# Patient Record
Sex: Male | Born: 1935
Health system: Southern US, Academic
[De-identification: ages and names within clinical notes are randomized; demographics above are authoritative.]

## PROBLEM LIST (undated history)

## (undated) ENCOUNTER — Encounter
Attending: Student in an Organized Health Care Education/Training Program | Primary: Student in an Organized Health Care Education/Training Program

## (undated) ENCOUNTER — Telehealth
Attending: Student in an Organized Health Care Education/Training Program | Primary: Student in an Organized Health Care Education/Training Program

## (undated) ENCOUNTER — Encounter

## (undated) ENCOUNTER — Ambulatory Visit: Payer: Medicare (Managed Care)

## (undated) ENCOUNTER — Ambulatory Visit

## (undated) ENCOUNTER — Telehealth

## (undated) ENCOUNTER — Ambulatory Visit: Payer: MEDICARE

## (undated) ENCOUNTER — Telehealth: Attending: Ambulatory Care | Primary: Ambulatory Care

## (undated) ENCOUNTER — Inpatient Hospital Stay

## (undated) ENCOUNTER — Ambulatory Visit: Payer: Medicare (Managed Care) | Attending: Adult Health | Primary: Adult Health

## (undated) ENCOUNTER — Encounter: Attending: Family | Primary: Family

## (undated) ENCOUNTER — Ambulatory Visit
Payer: Medicare (Managed Care) | Attending: Clinical Cardiac Electrophysiology | Primary: Clinical Cardiac Electrophysiology

## (undated) ENCOUNTER — Encounter: Attending: Internal Medicine | Primary: Internal Medicine

## (undated) ENCOUNTER — Ambulatory Visit: Payer: Medicare (Managed Care) | Attending: Rheumatology | Primary: Rheumatology

## (undated) ENCOUNTER — Ambulatory Visit: Payer: Medicare (Managed Care) | Attending: Internal Medicine | Primary: Internal Medicine

## (undated) ENCOUNTER — Encounter: Attending: Nurse Practitioner | Primary: Nurse Practitioner

## (undated) ENCOUNTER — Encounter: Attending: Ambulatory Care | Primary: Ambulatory Care

## (undated) ENCOUNTER — Other Ambulatory Visit

## (undated) ENCOUNTER — Ambulatory Visit
Attending: Student in an Organized Health Care Education/Training Program | Primary: Student in an Organized Health Care Education/Training Program

## (undated) ENCOUNTER — Ambulatory Visit: Payer: MEDICARE | Attending: Family | Primary: Family

## (undated) ENCOUNTER — Ambulatory Visit
Payer: Medicare (Managed Care) | Attending: Student in an Organized Health Care Education/Training Program | Primary: Student in an Organized Health Care Education/Training Program

## (undated) ENCOUNTER — Ambulatory Visit: Payer: Medicare (Managed Care) | Attending: Family | Primary: Family

## (undated) ENCOUNTER — Telehealth: Attending: Nurse Practitioner | Primary: Nurse Practitioner

## (undated) ENCOUNTER — Inpatient Hospital Stay: Payer: Medicare (Managed Care)

## (undated) ENCOUNTER — Encounter: Attending: Pharmacist | Primary: Pharmacist

---
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
## 2011-03-28 IMAGING — CR DG KNEE COMPLETE 4+V*R*
1 series · 4 of 4 positions shown · non-contrast
Comparison: none

REASON FOR EXAM: pain, swelling after fall
COMMENTS:

PROCEDURE:     MDR - MDR KNEE RT COMPLETE W/OBLIQUES  - October 23, 2009 [DATE]
RESULT:     No fracture or dislocation is observed. There is marked
narrowing of the knee joint space laterally consistent with arthritic
change. The patella is intact.

[Series 1: view not recorded · 0.17mm/px · 4 of 4 slices shown]
[im 1/4]
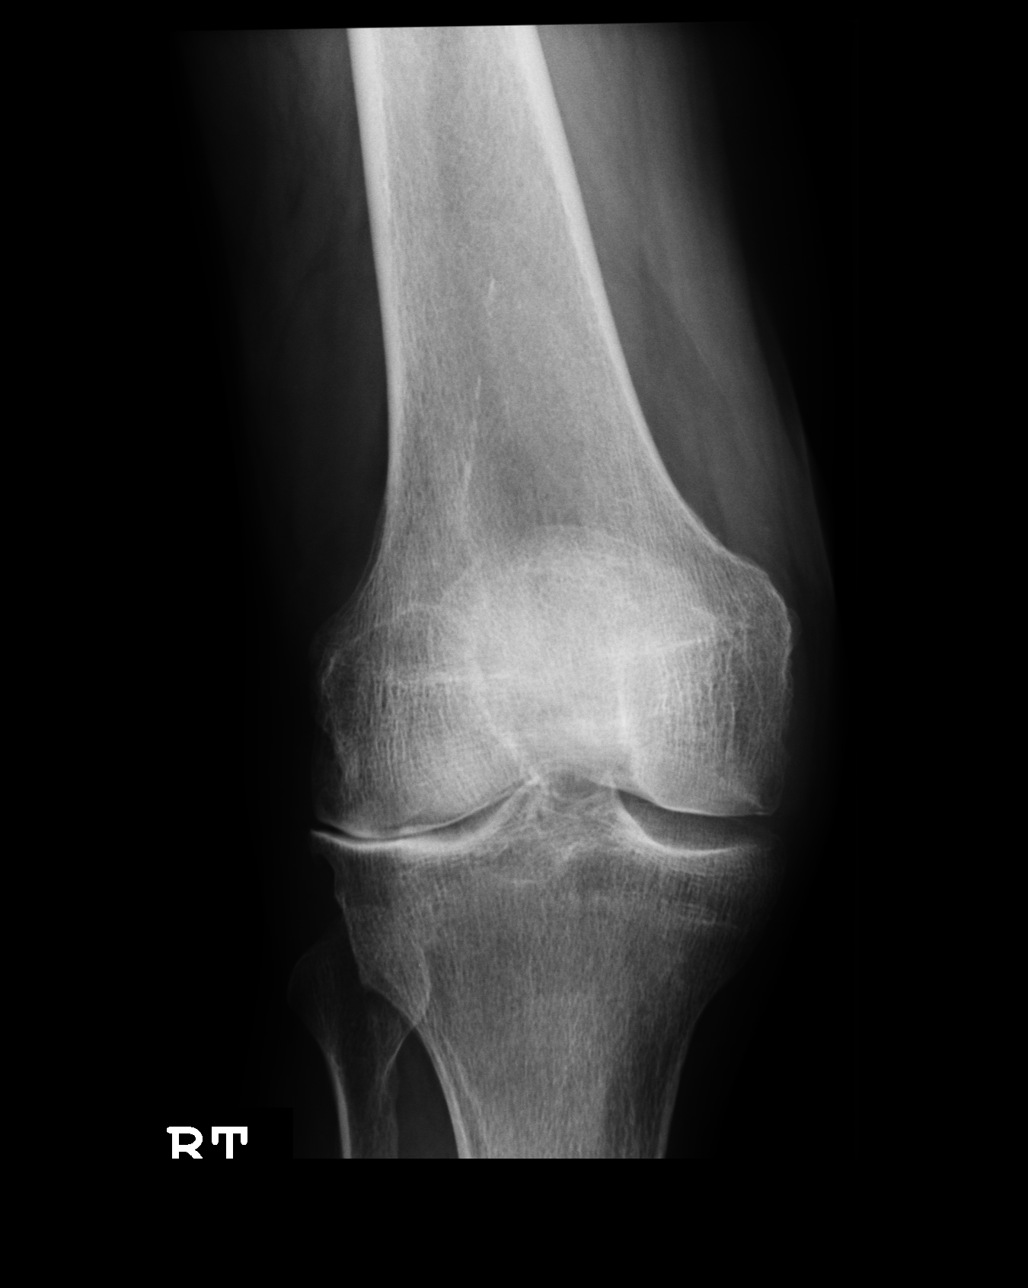
[im 2/4]
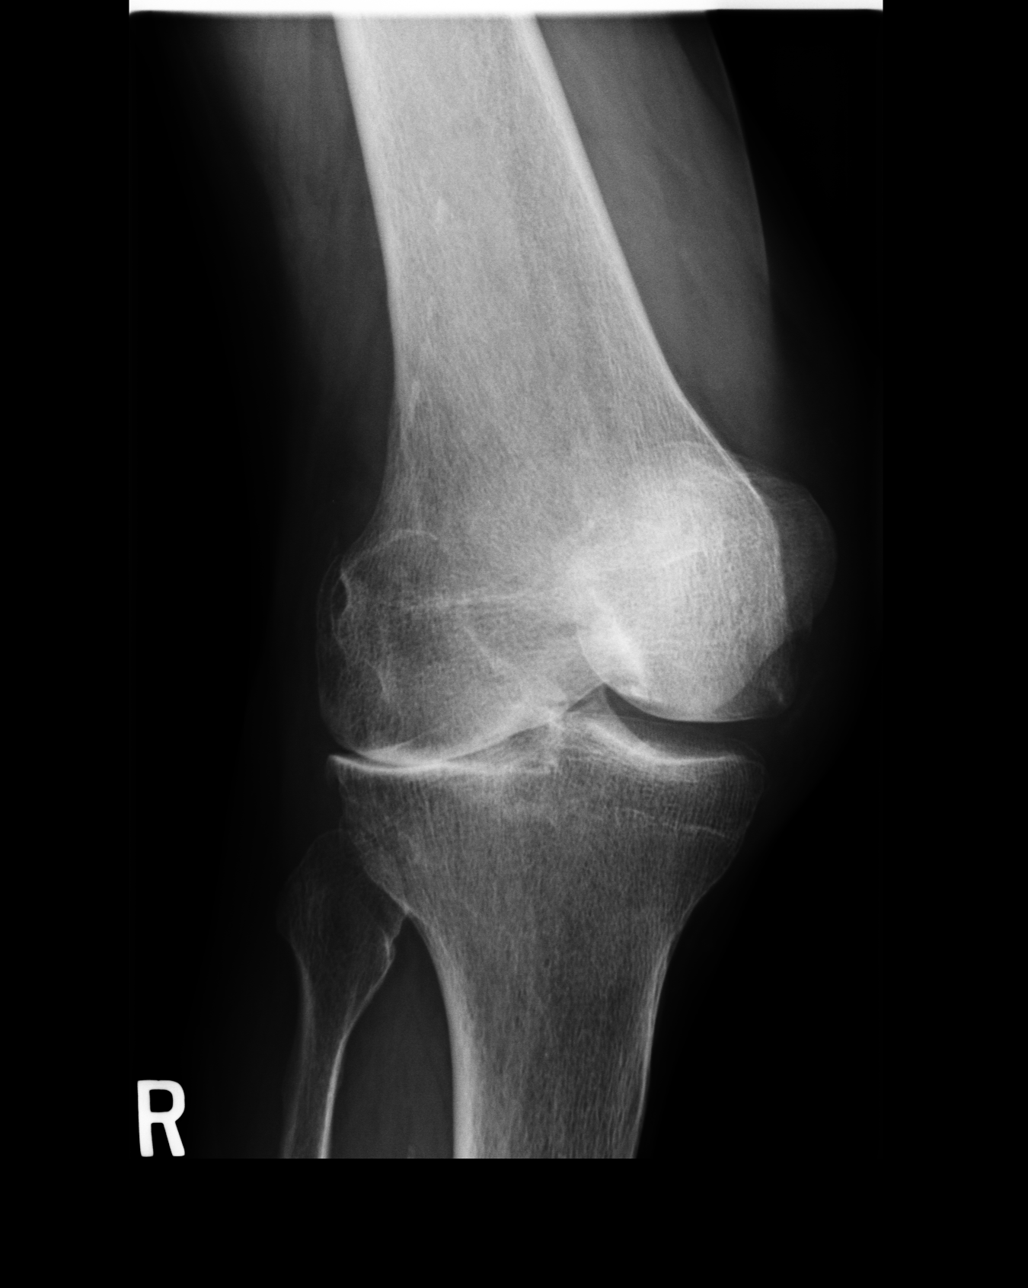
[im 3/4]
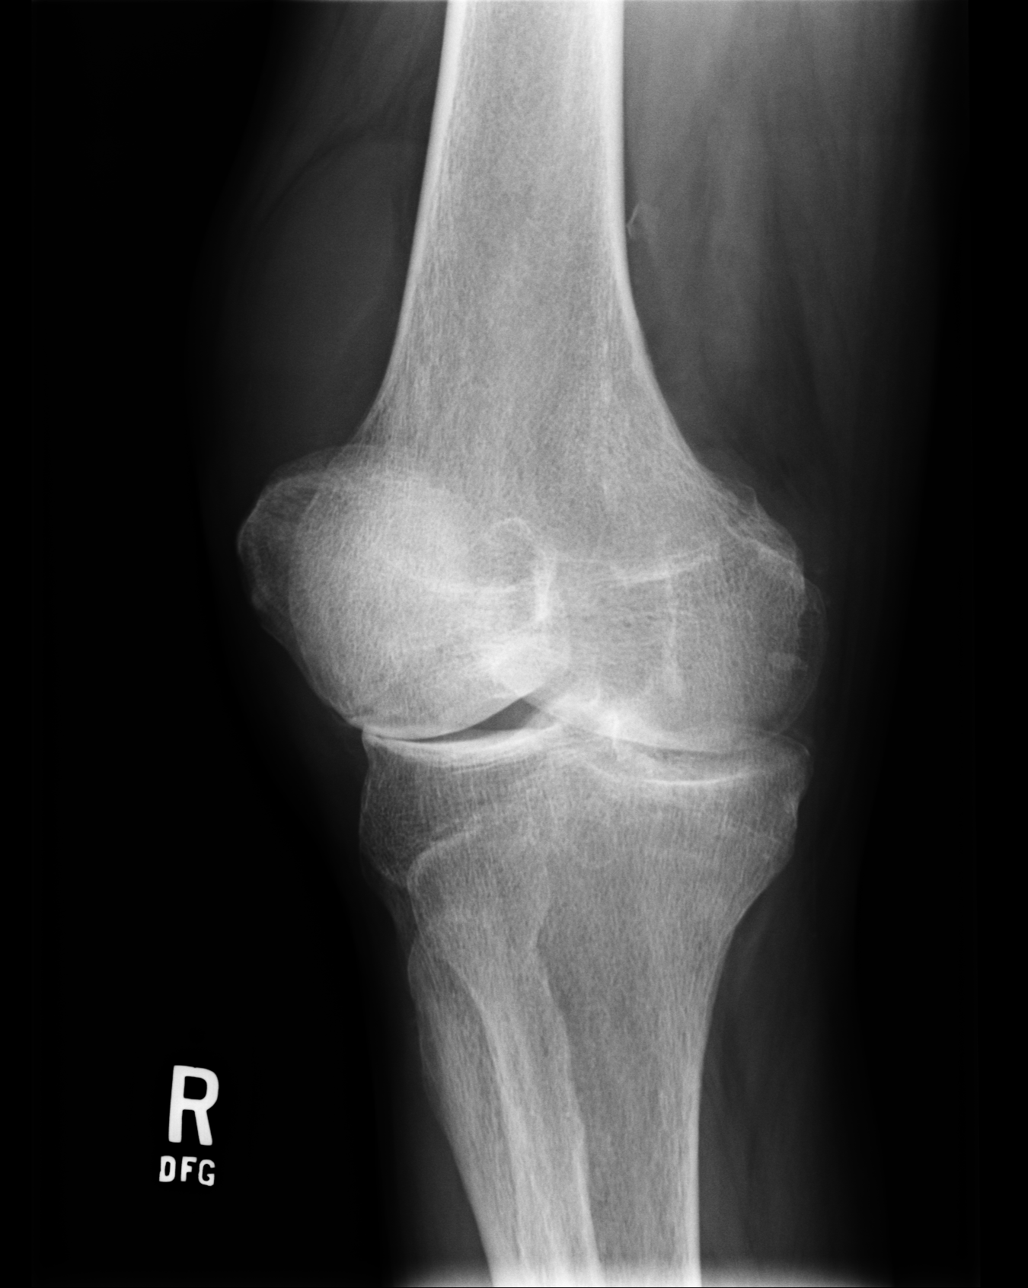
[im 4/4]
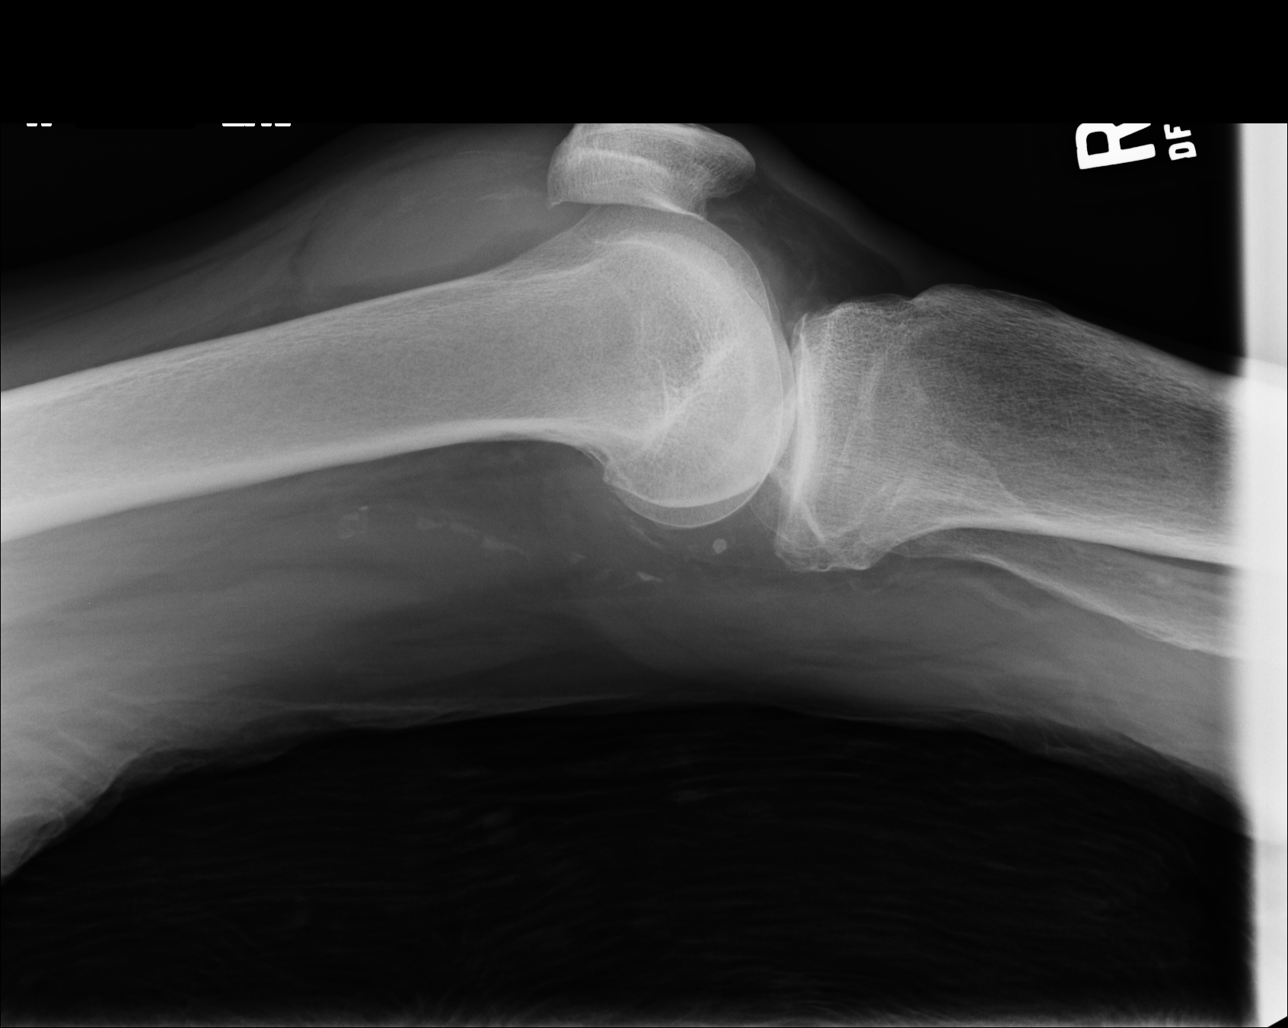

[4 of 4 positions shown; findings below may reference images not displayed]

IMPRESSION: 1. There is prominent arthritic change involving the knee joint laterally
where the knee joint space is nearly obliterated.
2. No fracture is identified.

## 2017-07-11 ENCOUNTER — Ambulatory Visit
Admission: RE | Admit: 2017-07-11 | Discharge: 2017-07-11 | Disposition: A | Discharge status: Home / Self Care | Payer: MEDICARE | Attending: Family Medicine

## 2017-07-11 DIAGNOSIS — D7589 Other specified diseases of blood and blood-forming organs: Secondary | ICD-10-CM

## 2017-07-11 DIAGNOSIS — I10 Essential (primary) hypertension: Principal | ICD-10-CM

## 2017-07-11 DIAGNOSIS — J449 Chronic obstructive pulmonary disease, unspecified: Secondary | ICD-10-CM

## 2017-07-11 DIAGNOSIS — G8929 Other chronic pain: Secondary | ICD-10-CM

## 2017-07-11 DIAGNOSIS — I25810 Atherosclerosis of coronary artery bypass graft(s) without angina pectoris: Secondary | ICD-10-CM

## 2017-07-11 DIAGNOSIS — M545 Low back pain: Secondary | ICD-10-CM

## 2017-07-11 MED ORDER — OXYCODONE-ACETAMINOPHEN 5 MG-325 MG TABLET
ORAL_TABLET | Freq: Four times a day (QID) | ORAL | 0 refills | 0 days | Status: CP | PRN
Start: 2017-07-11 — End: 2017-07-11

## 2017-07-11 MED ORDER — HYDRALAZINE 25 MG TABLET
ORAL_TABLET | Freq: Every day | ORAL | 11 refills | 0 days | Status: CP
Start: 2017-07-11 — End: 2018-04-03

## 2017-07-11 MED ORDER — LISINOPRIL 40 MG TABLET
ORAL_TABLET | Freq: Two times a day (BID) | ORAL | 11 refills | 0 days | Status: CP
Start: 2017-07-11 — End: 2017-12-15

## 2017-07-11 MED ORDER — LOSARTAN 100 MG TABLET
ORAL_TABLET | Freq: Every day | ORAL | 11 refills | 0.00000 days | Status: CP
Start: 2017-07-11 — End: 2018-04-03

## 2017-07-11 MED ORDER — ATENOLOL 50 MG TABLET
ORAL_TABLET | Freq: Two times a day (BID) | ORAL | 11 refills | 0 days | Status: CP
Start: 2017-07-11 — End: 2018-04-03

## 2017-08-11 MED ORDER — OXYCODONE-ACETAMINOPHEN 5 MG-325 MG TABLET
ORAL_TABLET | Freq: Four times a day (QID) | ORAL | 0 refills | 0 days | Status: CP | PRN
Start: 2017-08-11 — End: 2017-07-11

## 2017-09-11 MED ORDER — OXYCODONE-ACETAMINOPHEN 5 MG-325 MG TABLET
ORAL_TABLET | Freq: Four times a day (QID) | ORAL | 0 refills | 0.00000 days | Status: CP | PRN
Start: 2017-09-11 — End: 2017-10-02

## 2017-09-25 MED ORDER — AMLODIPINE 10 MG TABLET
ORAL_TABLET | 2 refills | 0 days | Status: CP
Start: 2017-09-25 — End: 2017-12-29

## 2017-10-02 ENCOUNTER — Ambulatory Visit: Admission: RE | Admit: 2017-10-02 | Discharge: 2017-10-02 | Payer: MEDICARE | Admitting: Family Medicine

## 2017-10-02 DIAGNOSIS — M545 Low back pain: Secondary | ICD-10-CM

## 2017-10-02 DIAGNOSIS — G8929 Other chronic pain: Secondary | ICD-10-CM

## 2017-10-02 DIAGNOSIS — H5462 Unqualified visual loss, left eye, normal vision right eye: Principal | ICD-10-CM

## 2017-10-09 MED ORDER — OXYCODONE-ACETAMINOPHEN 5 MG-325 MG TABLET
ORAL_TABLET | Freq: Four times a day (QID) | ORAL | 0 refills | 0.00000 days | Status: CP | PRN
Start: 2017-10-09 — End: 2017-10-02

## 2017-10-11 ENCOUNTER — Ambulatory Visit
Admission: RE | Admit: 2017-10-11 | Discharge: 2017-10-11 | Disposition: A | Discharge status: Home / Self Care | Payer: MEDICARE

## 2017-10-11 DIAGNOSIS — H47012 Ischemic optic neuropathy, left eye: Principal | ICD-10-CM

## 2017-10-13 MED ORDER — SIMVASTATIN 10 MG TABLET
ORAL_TABLET | Freq: Every day | ORAL | 1 refills | 0 days | Status: CP
Start: 2017-10-13 — End: 2018-04-30

## 2017-10-20 ENCOUNTER — Ambulatory Visit: Admission: RE | Admit: 2017-10-20 | Discharge: 2017-10-20 | Payer: MEDICARE

## 2017-10-20 DIAGNOSIS — M316 Other giant cell arteritis: Secondary | ICD-10-CM

## 2017-10-20 DIAGNOSIS — H47012 Ischemic optic neuropathy, left eye: Principal | ICD-10-CM

## 2017-10-22 DIAGNOSIS — H547 Unspecified visual loss: Principal | ICD-10-CM

## 2017-10-23 ENCOUNTER — Ambulatory Visit: Admission: RE | Admit: 2017-10-23 | Discharge: 2017-10-23 | Disposition: A | Discharge status: Home / Self Care

## 2017-10-23 ENCOUNTER — Ambulatory Visit
Admission: RE | Admit: 2017-10-23 | Discharge: 2017-10-23 | Disposition: A | Discharge status: Home / Self Care | Attending: Otolaryngology | Admitting: Otolaryngology

## 2017-10-23 DIAGNOSIS — H547 Unspecified visual loss: Principal | ICD-10-CM

## 2017-10-23 MED ORDER — BACITRACIN ZINC 500 UNIT/GRAM TOPICAL OINTMENT
0 refills | 0.00000 days
Start: 2017-10-23 — End: 2017-12-15

## 2017-10-23 MED ORDER — ACETAMINOPHEN 325 MG TABLET
Freq: Four times a day (QID) | ORAL | 0 refills | 0 days | PRN
Start: 2017-10-23 — End: 2017-12-15

## 2017-10-23 MED ORDER — IBUPROFEN 200 MG TABLET
ORAL_TABLET | Freq: Three times a day (TID) | ORAL | 0 refills | 0.00000 days | PRN
Start: 2017-10-23 — End: 2017-12-19

## 2017-10-26 MED ORDER — OMEPRAZOLE 20 MG CAPSULE,DELAYED RELEASE
ORAL_CAPSULE | Freq: Every day | ORAL | 3 refills | 0 days | Status: CP
Start: 2017-10-26 — End: 2017-12-15

## 2017-10-26 MED ORDER — PREDNISONE 20 MG TABLET
ORAL_TABLET | Freq: Every day | ORAL | 5 refills | 0.00000 days | Status: CP
Start: 2017-10-26 — End: 2017-11-25

## 2017-10-31 ENCOUNTER — Ambulatory Visit: Admission: RE | Admit: 2017-10-31 | Discharge: 2017-10-31 | Admitting: Otolaryngology

## 2017-10-31 DIAGNOSIS — Z09 Encounter for follow-up examination after completed treatment for conditions other than malignant neoplasm: Principal | ICD-10-CM

## 2017-11-05 MED ORDER — OXYCODONE-ACETAMINOPHEN 5 MG-325 MG TABLET: 1 | tablet | Freq: Four times a day (QID) | 0 refills | 0 days | Status: AC

## 2017-11-09 MED ORDER — OXYCODONE-ACETAMINOPHEN 5 MG-325 MG TABLET
ORAL_TABLET | Freq: Four times a day (QID) | ORAL | 0 refills | 0.00000 days | Status: CP | PRN
Start: 2017-11-09 — End: 2017-10-02

## 2017-11-17 ENCOUNTER — Ambulatory Visit: Admission: RE | Admit: 2017-11-17 | Discharge: 2017-11-17 | Payer: MEDICARE

## 2017-11-17 DIAGNOSIS — M316 Other giant cell arteritis: Principal | ICD-10-CM

## 2017-11-17 DIAGNOSIS — H47012 Ischemic optic neuropathy, left eye: Secondary | ICD-10-CM

## 2017-11-28 ENCOUNTER — Ambulatory Visit
Admission: RE | Admit: 2017-11-28 | Discharge: 2017-11-28 | Disposition: A | Discharge status: Home / Self Care | Payer: MEDICARE

## 2017-11-28 DIAGNOSIS — M549 Dorsalgia, unspecified: Secondary | ICD-10-CM

## 2017-11-28 DIAGNOSIS — M316 Other giant cell arteritis: Principal | ICD-10-CM

## 2017-11-28 DIAGNOSIS — Z7952 Long term (current) use of systemic steroids: Secondary | ICD-10-CM

## 2017-11-28 MED ORDER — TOCILIZUMAB 162 MG/0.9 ML SUBCUTANEOUS SYRINGE
SUBCUTANEOUS | 6 refills | 0.00000 days | Status: SS
Start: 2017-11-28 — End: 2017-11-28

## 2017-11-28 MED ORDER — TOCILIZUMAB 162 MG/0.9 ML SUBCUTANEOUS SYRINGE: mL | 6 refills | 0 days

## 2017-12-01 LAB — HIV ANTIGEN/ANTIBODY COMBO: HIV 1+2 Ab+HIV1 p24 Ag:PrThr:Pt:Ser/Plas:Ord:IA: NONREACTIVE

## 2017-12-01 LAB — HEPATITIS C ANTIBODY: Hepatitis C virus Ab:PrThr:Pt:Ser:Ord:: NONREACTIVE

## 2017-12-01 LAB — HEPATITIS B SURFACE ANTIBODY QUANT: Hepatitis B virus surface Ab:ACnc:Pt:Ser:Qn:: <8

## 2017-12-01 LAB — HEPATITIS B SURFACE ANTIGEN: Hepatitis B virus surface Ag:PrThr:Pt:Ser:Ord:: NONREACTIVE

## 2017-12-01 NOTE — Unmapped (Signed)
Addended by: Tommie Raymond on: 12/01/2017 11:43 AM     Modules accepted: Orders

## 2017-12-02 LAB — QUANTIFERON TB GOLD PLUS
QUANTIFERON ANTIGEN 1 MINUS NIL: -0.08 [IU]/mL
QUANTIFERON ANTIGEN 2 MINUS NIL: -0.1 [IU]/mL
QUANTIFERON MITOGEN: -0.08 [IU]/mL

## 2017-12-02 LAB — QUANTIFERON MITOGEN: Lab: -0.08

## 2017-12-04 LAB — VITAMIN D, TOTAL (25OH): Lab: 27.2

## 2017-12-09 MED ORDER — OXYCODONE-ACETAMINOPHEN 5 MG-325 MG TABLET
ORAL_TABLET | Freq: Four times a day (QID) | ORAL | 0 refills | 0.00000 days | Status: CP | PRN
Start: 2017-12-09 — End: 2017-11-05

## 2017-12-12 DIAGNOSIS — M316 Other giant cell arteritis: Principal | ICD-10-CM

## 2017-12-15 ENCOUNTER — Inpatient Hospital Stay
Admission: EM | Admit: 2017-12-15 | Discharge: 2017-12-19 | Disposition: A | Discharge status: Home / Self Care | Payer: MEDICARE | Source: Intra-hospital

## 2017-12-15 ENCOUNTER — Inpatient Hospital Stay
Admission: EM | Admit: 2017-12-15 | Discharge: 2017-12-19 | Disposition: A | Discharge status: Home / Self Care | Payer: MEDICARE | Source: Intra-hospital | Attending: Anesthesiology

## 2017-12-15 ENCOUNTER — Inpatient Hospital Stay
Admission: EM | Admit: 2017-12-15 | Discharge: 2017-12-19 | Disposition: A | Discharge status: Home / Self Care | Payer: MEDICARE | Source: Intra-hospital | Attending: Internal Medicine | Admitting: Internal Medicine

## 2017-12-15 DIAGNOSIS — K922 Gastrointestinal hemorrhage, unspecified: Principal | ICD-10-CM

## 2017-12-15 LAB — COMPREHENSIVE METABOLIC PANEL
ALBUMIN: 3.5 g/dL (ref 3.5–5.0)
ALKALINE PHOSPHATASE: 81 U/L (ref 38–126)
ALT (SGPT): 31 U/L (ref 19–72)
ANION GAP: 6 mmol/L — ABNORMAL LOW (ref 9–15)
AST (SGOT): 26 U/L (ref 19–55)
BILIRUBIN TOTAL: 0.3 mg/dL (ref 0.0–1.2)
BLOOD UREA NITROGEN: 27 mg/dL — ABNORMAL HIGH (ref 7–21)
BUN / CREAT RATIO: 28
CALCIUM: 9.3 mg/dL (ref 8.5–10.2)
CHLORIDE: 99 mmol/L (ref 98–107)
CO2: 28 mmol/L (ref 22.0–30.0)
EGFR MDRD AF AMER: >=60 mL/min/{1.73_m2} (ref >=60)
EGFR MDRD NON AF AMER: >=60 mL/min/{1.73_m2} (ref >=60)
GLUCOSE RANDOM: 124 mg/dL (ref 65–179)
POTASSIUM: 4.6 mmol/L (ref 3.5–5.0)
PROTEIN TOTAL: 6 g/dL — ABNORMAL LOW (ref 6.5–8.3)
SODIUM: 133 mmol/L — ABNORMAL LOW (ref 135–145)

## 2017-12-15 LAB — CBC W/ AUTO DIFF
BASOPHILS ABSOLUTE COUNT: 0 10*9/L (ref 0.0–0.1)
EOSINOPHILS ABSOLUTE COUNT: 0.1 10*9/L (ref 0.0–0.4)
HEMATOCRIT: 29.5 % — ABNORMAL LOW (ref 41.0–53.0)
HEMOGLOBIN: 10.2 g/dL — ABNORMAL LOW (ref 13.5–17.5)
LYMPHOCYTES ABSOLUTE COUNT: 1.2 10*9/L — ABNORMAL LOW (ref 1.5–5.0)
MEAN CORPUSCULAR HEMOGLOBIN CONC: 34.5 g/dL (ref 31.0–37.0)
MEAN CORPUSCULAR HEMOGLOBIN: 36.1 pg — ABNORMAL HIGH (ref 26.0–34.0)
MEAN CORPUSCULAR VOLUME: 104.6 fL — ABNORMAL HIGH (ref 80.0–100.0)
MEAN PLATELET VOLUME: 7.8 fL (ref 7.0–10.0)
NEUTROPHILS ABSOLUTE COUNT: 13.7 10*9/L — ABNORMAL HIGH (ref 2.0–7.5)
PLATELET COUNT: 314 10*9/L (ref 150–440)
RED BLOOD CELL COUNT: 2.82 10*12/L — ABNORMAL LOW (ref 4.50–5.90)
RED CELL DISTRIBUTION WIDTH: 14.1 % (ref 12.0–15.0)

## 2017-12-15 LAB — URINALYSIS WITH CULTURE REFLEX
BACTERIA: NONE SEEN /HPF
BILIRUBIN UA: NEGATIVE
GLUCOSE UA: NEGATIVE
LEUKOCYTE ESTERASE UA: NEGATIVE
NITRITE UA: NEGATIVE
PH UA: 7 (ref 5.0–9.0)
PROTEIN UA: NEGATIVE
RBC UA: <1 /HPF (ref <3)
SPECIFIC GRAVITY UA: 1.01 (ref 1.005–1.040)
SQUAMOUS EPITHELIAL: <1 /HPF (ref 0–5)
WBC UA: <1 /HPF (ref <2)

## 2017-12-15 LAB — BASIC METABOLIC PANEL
BLOOD UREA NITROGEN: 22 mg/dL — ABNORMAL HIGH (ref 7–21)
BUN / CREAT RATIO: 26
CALCIUM: 8.8 mg/dL (ref 8.5–10.2)
CHLORIDE: 100 mmol/L (ref 98–107)
CO2: 28 mmol/L (ref 22.0–30.0)
CREATININE: 0.85 mg/dL (ref 0.70–1.30)
EGFR MDRD AF AMER: >=60 mL/min/{1.73_m2} (ref >=60)
GLUCOSE RANDOM: 129 mg/dL (ref 65–179)
POTASSIUM: 3.9 mmol/L (ref 3.5–5.0)
SODIUM: 133 mmol/L — ABNORMAL LOW (ref 135–145)

## 2017-12-15 LAB — INR: Lab: 0.86

## 2017-12-15 LAB — ALKALINE PHOSPHATASE: Alkaline phosphatase:CCnc:Pt:Ser/Plas:Qn:: 81

## 2017-12-15 LAB — ERYTHROCYTE SEDIMENTATION RATE: Lab: 45 — ABNORMAL HIGH

## 2017-12-15 LAB — HEMOGLOBIN: Lab: 8.9 — ABNORMAL LOW

## 2017-12-15 LAB — TROPONIN I: Troponin I.cardiac:MCnc:Pt:Ser/Plas:Qn:: <0.06

## 2017-12-15 LAB — PROTIME-INR: INR: 0.86

## 2017-12-15 LAB — C-REACTIVE PROTEIN: C reactive protein:MCnc:Pt:Ser/Plas:Qn:: 9.9

## 2017-12-15 LAB — LIPASE: Triacylglycerol lipase:CCnc:Pt:Ser/Plas:Qn:: 38 — ABNORMAL LOW

## 2017-12-15 LAB — SODIUM: Sodium:SCnc:Pt:Ser/Plas:Qn:: 133 — ABNORMAL LOW

## 2017-12-15 LAB — APTT
Coagulation surface induced:Time:Pt:PPP:Qn:Coag: 27.1 — ABNORMAL LOW
HEPARIN CORRELATION: <0.2

## 2017-12-15 LAB — BILIRUBIN UA: Lab: NEGATIVE

## 2017-12-15 LAB — BASOPHILS ABSOLUTE COUNT: Lab: 0

## 2017-12-15 NOTE — Unmapped (Signed)
PHYSICAL THERAPY  Evaluation (12/15/17 0840)     Patient Name:  Erik [name]       Medical Record Number: [id number]   Date of Birth: Mar 19, 1934  Sex: Male            Treatment Diagnosis: mobility assessment     ASSESSMENT    Pt is a 83 yo male with PMHx CAD s/p CABG x 3 (2001), giant cell arteritis now blind in his L eye, HTN, tobacco abuse, chronic back pain on opioids who presents with epigastric pain and constipation found to have melena and acute drop in his hemoglobin with CT findings of duodenitis. Pt presents to skilled PT services at baseline function performing all mobility independent. All questions/concerns addressed. Based on the AM-PAC 6 item raw score of 24/24, the patient is considered to be 0% impaired with basic mobility. This indicates No further PT indicated.    Moderate complexity evaluation completed based on patients PMHx, current deficits and clinical judgement.       Today's Interventions: AMPAC 24/24; Educ re; PT role, PT POC, fall risk reduction, safety and sequencing with mobility; bed mobility, transfer training, gait training,     Activity Tolerance: Patient tolerated treatment well    PLAN  Planned Frequency of Treatment:  D/C Services for: D/C Services      Planned Interventions:      Post-Discharge Physical Therapy Recommendations:  PT services not indicated        PT DME Recommendations: None     Goals:                                                                                                            Prognosis:  Good          SUBJECTIVE  Patient reports: Pt/RN agreeable to therapy session   Current Functional Status: received supine in bed; session ended with pt supine in bed  Services patient receives: PT;OT  Prior functional status: Pt reports indep with ADLs, functional mobility and ambulation indepdent; daughter lives with patient        Past Medical History:   Diagnosis Date   ??? Cataract 2005    cataract surgery   ??? COPD (chronic obstructive pulmonary disease) (CMS-HCC) 10/07/2016   ??? Hypertension    ??? Tobacco abuse 02/15/2014    Social History   Substance Use Topics   ??? Smoking status: Current Every Day Smoker     Packs/day: 0.50     Years: 4.00   ??? Smokeless tobacco: Never Used      Comment: quit for 25 years, restarted 2014   ??? Alcohol use 2.4 oz/week     4 Cans of beer per week      Past Surgical History:   Procedure Laterality Date   ??? CARDIAC VALVE REPLACEMENT     ??? CATARACT EXTRACTION     ??? CORONARY ARTERY BYPASS GRAFT  2001    x3   ??? EYE SURGERY  2005    cataracts   ??? PR REPAIR ING HERNIA,5+Y/O,REDUCIBL  Left 06/19/2015    Procedure: REPAIR INITIAL INGUINAL HERNIA, AGE 50 YEARS OR OLDER; REDUCIBLE;  Surgeon: Shelda Jakes, MD;  [facility];  Service: Trauma   ??? PR TEMPORAL ARTERY LIGATN OR BX Left 10/23/2017    Procedure: LIGATION OR BIOPSY, TEMPORAL ARTERY;  Surgeon: Vivien Rossetti, MD;  [facility];  Service: ENT    Family History   Problem Relation Age of Onset   ??? Thyroid disease Sister    ??? Cancer Brother    ??? Glaucoma Neg Hx    ??? Macular degeneration Neg Hx    ??? Retinal detachment Neg Hx         Allergies: Patient has no known allergies.                Objective Findings              Precautions: Falls              Weight Bearing Status: Non- applicable              Required Braces or Orthoses: Non- applicable    Communication Preference: Verbal;Visual  Pain Comments: denies pain   Medical Tests / Procedures: chart review per epic   Equipment / Environment: Vascular access (PIV, TLC, Port-a-cath, PICC)    At Rest: VSS per epic, RN cleared  With Activity: NAD           Living environment: House  Lives With: Daughter  Home Living: One level home;Stairs to enter with rails  Rail placement (outside): Bilateral rails     Number of Stairs: 4    Cognition: A&Ox4  Visual / Perception Status: glasses reports he is legally blind in left eye and thats why he lives with his daughter  Skin Inspection: skin observed intact    UE ROM: WFL UE Strength: WFL  LE ROM: WFL  LE Strength: WFL                       Sensation: SILT   Balance: sitting EOB indep; standing indep    Posture: kyphotic thoracic      Bed Mobility: indep   Transfers: sit<>stand indep    Gait: ambulated within patients room independently  ; pt able to demonstrate high knee marches and single leg stance               Eval Duration(PT): 25 Min.    Medical Staff Made Aware: RN aware   PT G-Codes  Functional Assessment Tool Used: AMPAC   Score: 24/24  Functional Limitation: Mobility: Walking and moving around  Mobility: Walking and Moving Around Current Status [phone]): 0 percent impaired, limited or restricted  Mobility: Walking and Moving Around Goal Status (U0454): 0 percent impaired, limited or restricted  Mobility: Walking and Moving Around Discharge Status [phone]): 0 percent impaired, limited or restricted  I attest that I have reviewed the above information.  Signed: Lonzo Candy, PT  Filed 12/15/2017

## 2017-12-15 NOTE — Unmapped (Signed)
Problem: Patient Care Overview  Goal: Plan of Care Review  Outcome: Progressing  Pt arrived to the floor from [facility] ED. Pt is in good spirits, no complaints of pain, no abdominal discomfort. Pt refused to take off pants because it was cold. Daughter took cash, keys, and wallet home. Will continue to monitor.

## 2017-12-15 NOTE — Unmapped (Signed)
Pt has had 3-4 days w/o bm. States dug it out with his fingers and it was very dark black and hard. No vomiting

## 2017-12-15 NOTE — Unmapped (Signed)
Pt states he has had cold s/s - runny nose, sneezing - for 3 days.  Pt states his constipation started at same time.  Is passing flatus.  Pt states he has chronic constipation and tried to cure this current episode by drinking a tomato juice and 3 beers.  Pt states when this did not induce a BM he tried self-disimpaction.  This did not result in a BM either.

## 2017-12-15 NOTE — Unmapped (Addendum)
Iv was placed, blood did not return in tubing.

## 2017-12-15 NOTE — Unmapped (Signed)
[facility] History and Physical    Assessment/Plan:    Principal Problem:    Duodenitis  Active Problems:    Essential hypertension, benign    Chronic back pain    CAD (coronary artery disease), autologous vein bypass graft    Tobacco abuse    Giant cell arteritis (CMS-HCC)    Melena    Constipation  Resolved Problems:    * No resolved hospital problems. *      Erik [name] is a 83 y.o. 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    UGIB: Epigastric pain with new melena, drop in hemoglobin from 14.5-->10.2 in 2 weeks, hemoccult positive stool in the setting of high dose prednisone for recently dx giant cell arteritis, aspirin, ibuprofen, and alcohol use. He did not start a PPI due to cost. CT A/P obtained in the ED showed duodenitis. He is hemodynamically stable currently and does not require an emergent endoscopy.   - CBC @ 8AM  - T&S  - IV Protonix BID  - Discontinue ibuprofen  - Hold ASA for now, 17 years since CABG  - Contact Rheumatology in AM to evaluate if his prednisone taper can be adjusted  - Counseled on alcohol cessation    Cough: Multiple sick contacts, denies fever but endorses cough productive of yellow sputum. Leukocytosis in the setting of prednisone use. Denies a chronic cough. Carries a dx of COPD, but no formal PFTs documented and not on an inhalers.   - CXR  - Duonebs PRN    Giant Cell Arteritis: Diagnosed with temporal biopsy after left visual loss., started on Prednisone 60 mg daily in early November 2018, now tapered to 50 mg daily for 1 month (12/7-1/7) then 40 mg daily until follow up. Planning to start Tocilizumab once approved.   - Clarify with Rheumatology prednisone taper  - Continue Prednisone 50 mg daily for now  - Calcium and vitamin D supplementation CAD s/p 3v CABG:   - Hold ASA as above, when resume start enteric coated   - Continue statin  - Continue BB    HTN: Hypertensive in ED, on an odd home regimen of hydralazine 25 mg once daily, lisinopril 40 mg daily, losartan 100 mg daily, atenolol 50 mg BID per records, he does not remember all his medications.  - Continue atenolol 50 mg BID, can consider changing to coreg for better BP control  - Continue losartan 100 mg daily  - Hydralazine 25 mg once daily, pending BP consider increasing to more normal dose of TID or at least BID  - Hold lisinopril while on losartan     Alcohol use: Drinks up to 4 beers a day, drinks daily. Denies a history of withdrawal such as during his hospitalization for his CABG.   - CIWA scoring, add ativan if needed    Tobacco use: Previously had quit smoking for ~25 years, resume about 4 years ago. Declined nicotine patch.     Constipation: Likely component of OIC  - Miralax daily, increase as needed    Chronic back pain:   - Continue home oxycodone-acetaminophen 5-325 mg q6h prn, verified in PDMP Report      Cognitive assessment:  CAM (Confusion Assessment Method): Negative for evidence of delirium  Other cognitive assessment: not completed  FEN: Regular diet, replace electrolytes as needed, no IVF    Prophylaxis:  - ZOX:[id number] 2/2 UGIB    Advance Care Planning & Code Status:  DNR/DNI  Surrogate decision maker: Erik [name]    Disposition:  Admit to Med A, floor status    Emergency Contact:  Extended Emergency Contact Information  Primary Emergency Contact: Erik [name] [place]  Home Phone: [phone]  Relation: Daughter  Secondary Emergency Contact: Erik [name]   [place]  Home Phone: [phone]  Relation: Son    ___________________________________________________________________    Chief Complaint:  Chief Complaint   Patient presents with   ??? Abdominal Problem     <principal problem not specified>    PCP: Erik Logan, MD HPI:  Erik [name] is a 83 y.o. 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    He reports feeling epigastric burning pain for a few weeks. He has taken OTC anti-acids intermittently. Additionally he has been constipated for 3-4 days which prompted him to self disimpact. He noted his stool was black, normally brown, no hx of melena. He was recently diagnosed with giant cell arteritis after losing vision in his left eye and has been on prednisone 60 mg daily since early November, tapered to 50 mg daily on 12/7 for a month followed by 40 mg daily. He was instructed to start a PPI however it cost $24 so he did not start one. Additionally he takes a baby ASA daily given his hx of CABG. He take ibuprofen 200-400 mg nearly daily for pain and he drinks anywhere for 2-4 beers every day. Denies a hx of gastric or duodenal ulcers. No known cirrhosis.    He reports he has been battling a cold, multiple family member have had viral illnesses. He has a cough that is productive of yellow sputum. No fever or chills.     In the ED he was hypertensive and not tachycardic. Labs notable for a hemoglobin of 10.2 from 14.5 on 12/7, remains macrocytic. BUN 27 from 21, normal lipase, INR 0.86. CT scan with duodenal wall thickening and fat stranding concerning for duodenitis.     Allergies:  Patient has no known allergies.    Medications:   Prior to Admission medications    Medication Dose, Route, Frequency   amLODIPine (NORVASC) 10 MG tablet TAKE 1 TABLET BY MOUTH ONCE DAILY   ascorbic acid (VITAMIN C) 500 MG tablet 500 mg, Oral, Daily (standard)   aspirin (ECOTRIN) 81 MG tablet 81 mg, Oral, Daily (standard)   atenolol (TENORMIN) 50 MG tablet 50 mg, Oral, 2 times a day (standard)   cyanocobalamin (VITAMIN B-12) 1000 MCG tablet 1,000 mcg, Oral, Daily (standard)   hydrALAZINE (APRESOLINE) 25 MG tablet 25 mg, Oral, Daily (standard)   ibuprofen (ADVIL,MOTRIN) 200 MG tablet 400 mg, Oral, Every 8 hours PRN   ketoconazole (NIZORAL) 2 % cream 1 application, Topical, Daily (standard), Or as directed.   lisinopril (PRINIVIL,ZESTRIL) 40 MG tablet 40 mg, Oral, Daily (standard)   losartan (COZAAR) 100 MG tablet 100 mg, Oral, Daily (standard)   multivitamin (TAB-A-VITE/THERAGRAN) per tablet 1 tablet, Oral, Daily (standard)   oxyCODONE-acetaminophen (PERCOCET) 5-325 mg per tablet 1 tablet, Oral, Every  6 hours PRN   simvastatin (ZOCOR) 10 MG tablet 10 mg, Oral, Daily (standard)   tocilizumab (ACTEMRA) 162 mg/0.9 mL Syrg subcutaneous injection 162 mg, Subcutaneous, Every 7 days   triamcinolone (KENALOG) 0.1 % cream Topical, 2 times a day (standard)       Medical History:  Past Medical History:   Diagnosis Date   ??? Cataract 2005    cataract surgery   ??? COPD (chronic obstructive pulmonary disease) (CMS-HCC) 10/07/2016   ??? Hypertension    ??? Tobacco abuse 02/15/2014       Surgical History:  Past Surgical History:   Procedure Laterality Date   ??? CARDIAC VALVE REPLACEMENT     ??? CATARACT EXTRACTION     ??? CORONARY ARTERY BYPASS GRAFT  2001    x3   ??? EYE SURGERY  2005    cataracts   ??? PR REPAIR ING HERNIA,5+Y/O,REDUCIBL Left 06/19/2015    Procedure: REPAIR INITIAL INGUINAL HERNIA, AGE 35 YEARS OR OLDER; REDUCIBLE;  Surgeon: Shelda Jakes, MD;  Location: [facility];  Service: Trauma   ??? PR TEMPORAL ARTERY LIGATN OR BX Left 10/23/2017    Procedure: LIGATION OR BIOPSY, TEMPORAL ARTERY;  Surgeon: Vivien Rossetti, MD;  [facility];  Service: ENT       Social History:  Social History     Social History   ??? Marital status: Widowed     Spouse name: N/A   ??? Number of children: N/A   ??? Years of education: N/A     Occupational History   ??? Not on file.     Social History Main Topics   ??? Smoking status: Current Every Day Smoker Packs/day: 0.50     Years: 4.00   ??? Smokeless tobacco: Never Used      Comment: quit for 25 years, restarted 2014   ??? Alcohol use 2.4 oz/week     4 Cans of beer per week   ??? Drug use: No   ??? Sexual activity: Not on file     Other Topics Concern   ??? Do You Use Sunscreen? No   ??? Tanning Bed Use? No   ??? Are You Easily Burned? No   ??? Excessive Sun Exposure? No   ??? Blistering Sunburns? Yes     Past     Social History Narrative    Lives in [place], wife passed 7 years ago. His daughter lives with him. Granddaughter, Victorino Dike, manages his finances.        Living situation:   Patient lives in own home with daughter, Lawson Fiscal.     ADLs:  Feeding: Independent  Dressing: Independent  Ambulation: Independent  Toileting: Independent  Bathing: Independent    IADLs:  Using the telephone: Independent  Shopping: Independent  Meal preparation: Independent  Medication management: Independent  Managing finances: Independent  Housework: Solicitor (driving or navigating public transit): Independent     Assistive devices: glasses    Family History:  Family History   Problem Relation Age of Onset   ??? Thyroid disease Sister    ??? Cancer Brother    ??? Glaucoma Neg Hx    ??? Macular degeneration Neg Hx    ??? Retinal detachment Neg Hx        Review of Systems:  10 systems reviewed and are negative unless otherwise mentioned in HPI    Labs/Studies:  Labs and Studies from the last 24hrs per EMR and Reviewed    Physical Exam:  Temp:  [35.8 ??C-36.5 ??C] 35.9 ??C  Heart Rate:  [72-83] 76  Resp:  [20-21] 20  BP: (144-178)/(62-76) 178/76  SpO2:  [92 %-98 %] 98 %, BP 178/76  - Pulse 76  - Temp 35.9 ??C (Oral)  - Resp 20  - Ht 152.4 cm (5')  - Wt 57.5 kg (126 lb 12.8 oz)  - SpO2 98%  - BMI 24.76 kg/m?? , Body mass index is 24.76 kg/m??.,   Wt Readings from Last 3 Encounters:   12/15/17 57.5 kg (126 lb 12.8 oz)   11/28/17 58.1 kg (128 lb)   10/23/17 56.2 kg (123 lb 14.4 oz)       GEN: NAD, laying in bed  EYES: EOMI  CV: RRR, no murmurs appreciated PULM: Wet cough, lungs CTAB although limited by frequent cough, no wheezing  ABD: soft, mild epigastric tenderness, no rebound or guarding. +BS  EXT: Trace LLE edema (vein graft harvest)  NEURO: No focal deficits  PSYCH: Alert, oriented to situation, appropriate

## 2017-12-15 NOTE — Unmapped (Signed)
Patient transported to CT Scan  Transported by Radiology  How tranported Stretcher  Cardiac Monitor no

## 2017-12-15 NOTE — Unmapped (Signed)
Pt ambulated in room.  Even gait noted.

## 2017-12-15 NOTE — Unmapped (Signed)
OCCUPATIONAL THERAPY  Evaluation (12/15/17 0910)    Patient Name:  Erik [name]       Medical Record Number: [id number]   Date of Birth: 27-Jan-1934  Sex: Male          OT Treatment Diagnosis:  eval and treat     Assessment  83 yo male hx COPD, CAD , HTN, chr back pain and GCA s/p UGIB. Pt was educated on the role of OT in the acute care setting. Pt has an AM-PAC scire if 24/24 and no self care deficits. Pt has no further needs. D/C OT  Dispo: no f/u OT  Pt is a low complexity pt for OT 2/2 clinical judgment.   Today's Interventions: ADL retraining;Balance activities;Bed mobility;Education - Patient;Education - Family / caregiver;Endurance activities;Safety education;Positioning;Functional mobility;Functional cognition;Transfer training;UE Strength / coordination exercise;Visual / perceptual tasks;Other    Activity Tolerance During Today's Session  Patient tolerated treatment well    Plan  Planned Frequency of Treatment:  D/C Services for: D/C Services       Planned Interventions:       Post-Discharge Occupational Therapy Recommendations:  OT Post Acute Discharge Recommendations: OT services not indicated    ;    OT DME Recommendations: None    GOALS:   Patient and Family Goals: home             Short Term:                                       Prognosis:     Positive Indicators:     Barriers to Discharge: None    Subjective  Current Status Pt lying in bed resting.   Prior Functional Status pt was I w/ self care, IADL's, walking and driving.       Services patient receives: OT  Patient / Caregiver reports: Charity fundraiser and pt agreed to OT.     Past Medical History:   Diagnosis Date   ??? Cataract 2005    cataract surgery   ??? COPD (chronic obstructive pulmonary disease) (CMS-HCC) 10/07/2016   ??? Hypertension    ??? Tobacco abuse 02/15/2014    Social History   Substance Use Topics   ??? Smoking status: Current Every Day Smoker     Packs/day: 0.50     Years: 4.00   ??? Smokeless tobacco: Never Used      Comment: quit for 25 years, restarted 2014   ??? Alcohol use 2.4 oz/week     4 Cans of beer per week      Past Surgical History:   Procedure Laterality Date   ??? CARDIAC VALVE REPLACEMENT     ??? CATARACT EXTRACTION     ??? CORONARY ARTERY BYPASS GRAFT  2001    x3   ??? EYE SURGERY  2005    cataracts   ??? PR REPAIR ING HERNIA,5+Y/O,REDUCIBL Left 06/19/2015    Procedure: REPAIR INITIAL INGUINAL HERNIA, AGE 69 YEARS OR OLDER; REDUCIBLE;  Surgeon: Shelda Jakes, MD;  [facility]: [facility];  Service: Trauma   ??? PR TEMPORAL ARTERY LIGATN OR BX Left 10/23/2017    Procedure: LIGATION OR BIOPSY, TEMPORAL ARTERY;  Surgeon: Vivien Rossetti, MD;  Location: [facility];  Service: ENT    Family History   Problem Relation Age of Onset   ??? Thyroid disease Sister    ??? Cancer Brother    ???  Glaucoma Neg Hx    ??? Macular degeneration Neg Hx    ??? Retinal detachment Neg Hx         Patient has no known allergies.     Objective Findings  Precautions / Restrictions  Falls precautions    Weight Bearing  Non-applicable    Required Braces or Orthoses  Non-applicable    Communication Preference  Verbal;Visual    Pain  Pt reported no pain.     Equipment / Environment  Vascular access (PIV, TLC, Port-a-cath, PICC)    Living Situation   Living environment: House   Lives With: Daughter (Pt's dtr has lived w/ him for 3 months now. )   Home Living: One level home;Tub/shower unit;Standard height toilet   Equipment available at home: None   Rail placement (outside): Bilateral rails        Cognition   Orientation Level:  Oriented x 4   Arousal/Alertness:  Appropriate responses to stimuli   Attention Span:  Appears intact   Memory:  Appears intact   Following Commands:  Follows all commands and directions without difficulty   Safety Judgment:  Good awareness of safety precautions   Awareness of Errors:  Good awareness of safety precautions   Problem Solving:  Able to problem solve independently   Comments:      Vision / Perception  Vision: Wears glasses all the time  Perception: WFL  Comments: L eye- nerve burst in eye    Hand Function  Hand Dominance: right   bilateral hands WFL  strength 4/5     Skin Inspection  intact    ROM / Strength/Coordination  UE ROM/ Strength/ Coordination: bilateral UE's WFL  strength 4/5   LE ROM/ Strength/ Coordination: bilateral LE's WFL      Sensation:  WFL     Balance:  I     Mobility/Gait/Transfers: I    ADL:  Bathing: NT  Grooming: I standing at sink   Dressing: I  Eating: I  Toileting: I  IADLs: NT    Vitals/ Orthostatics:  At Rest: NA  With Activity: NA  Orthostatics: NA    Interventions Performed During Today's Session: see above    OT G-codes  Functional Limitation: Self care  Self Care Current Status (Z6109): 0 percent impaired, limited or restricted  Self Care Goal Status (U0454): 0 percent impaired, limited or restricted  Self Care Discharge Status [phone]): 0 percent impaired, limited or restricted  Rationale: pt has no self care deficits .     Eval Duration (OT): 23 Min.    Medical Staff Made Aware: RN aware    I attest that I have reviewed the above information.  Signed: Gladys Damme, OT  Filed 12/15/2017

## 2017-12-15 NOTE — Unmapped (Signed)
Report called to 4BT.

## 2017-12-15 NOTE — Unmapped (Signed)
[facility] Emergency Department Provider Note    ED Clinical Impression     Final diagnoses:   Upper GI bleed (Primary)   Duodenitis       Initial Impression, ED Course, Assessment and Plan     Abdominal pain with black stool   -Patient has multiple risk factors for bleeding including aspirin and prednisone use.  Given that the rectal exam demonstrated black stool Hemoccult positive I am concerned about an upper GI bleed.  Will obtain CT scan to further evaluate given his reported abdominal pain with midepigastric tenderness.  Hemoglobin down from baseline where his prior was 14 and now he is 10.      3:51:  Ct demonstrates duodenitis.  Will admit to medicine for management of acute upper GI bleed.  Protonix IV ordered.          ____________________________________________    Time seen: December 15, 2017 2:31 AM       History     Chief Complaint  Abdominal Problem      HPI   Erik [name] is a 83 y.o. male with hx of COPD, CAD s/p CABG, HTN, and Giant Cell Arteritis managed by Rheumatology/Optho on long term prednisone who presents with chief complaint of constipation and black stool.  He reports he has had no bowel movements in around 4-5 days.  He states he normally has a bowel movement every other day.  He states he has been attempting drinking tomato juice and reported to the nurse some alcohol use during this time.  As well with no bowel movements.  This afternoon and attempt to have a bowel movement he stuck his finger up his rectum and removed what he describes as thick black stool.  He states he has never had black stool before with it always being brown.  He denies any vomiting.  He denies any history of a colonoscopy and states I will not get one now at 63.  He reports mild upper abdominal pain since his constipation began.          Past Medical History:   Diagnosis Date   ??? Cataract 2005    cataract surgery   ??? COPD (chronic obstructive pulmonary disease) (CMS-HCC) 10/07/2016   ??? Hypertension ??? Tobacco abuse 02/15/2014       Past Surgical History:   Procedure Laterality Date   ??? CARDIAC VALVE REPLACEMENT     ??? CATARACT EXTRACTION     ??? CORONARY ARTERY BYPASS GRAFT  2001    x3   ??? EYE SURGERY  2005    cataracts   ??? PR REPAIR ING HERNIA,5+Y/O,REDUCIBL Left 06/19/2015    Procedure: REPAIR INITIAL INGUINAL HERNIA, AGE 29 YEARS OR OLDER; REDUCIBLE;  Surgeon: Shelda Jakes, MD;  [facility]: [facility] OR [facility];  Service: Trauma   ??? PR TEMPORAL ARTERY LIGATN OR BX Left 10/23/2017    Procedure: LIGATION OR BIOPSY, TEMPORAL ARTERY;  Surgeon: Vivien Rossetti, MD;  Location[facility] OR [facility];  Service: ENT       No current facility-administered medications for this encounter.     Current Outpatient Prescriptions:   ???  acetaminophen (TYLENOL) 325 MG tablet, Take 1 tablet (325 mg total) by mouth every six (6) hours as needed (mild pain). Do not take with Percocet., Disp: , Rfl: 0  ???  amLODIPine (NORVASC) 10 MG tablet, TAKE 1 TABLET BY MOUTH ONCE DAILY, Disp: 30 tablet, Rfl: 2  ???  ascorbic acid (VITAMIN C) 500 MG  tablet, Take 500 mg by mouth daily., Disp: , Rfl:   ???  aspirin (ECOTRIN) 81 MG tablet, Take 81 mg by mouth daily., Disp: , Rfl:   ???  atenolol (TENORMIN) 50 MG tablet, Take 1 tablet (50 mg total) by mouth Two (2) times a day., Disp: 60 tablet, Rfl: 11  ???  bacitracin ointment, Apply to left temple wound twice daily after cleaning., Disp: 30 g, Rfl: 0  ???  cyanocobalamin (VITAMIN B-12) 1000 MCG tablet, Take 1,000 mcg by mouth daily., Disp: , Rfl:   ???  docusate sodium (COLACE) 100 MG capsule, Take 1 capsule (100 mg total) by mouth Two (2) times a day., Disp: 20 capsule, Rfl: 0  ???  hydrALAZINE (APRESOLINE) 25 MG tablet, Take 1 tablet (25 mg total) by mouth daily., Disp: 30 tablet, Rfl: 11  ???  ibuprofen (ADVIL,MOTRIN) 200 MG tablet, Take 2 tablets (400 mg total) by mouth every eight (8) hours as needed (moderate pain)., Disp: 30 tablet, Rfl: 0  ???  ketoconazole (NIZORAL) 2 % cream, Apply 1 application topically daily. Or as directed., Disp: 15 g, Rfl: 11  ???  lisinopril (PRINIVIL,ZESTRIL) 40 MG tablet, Take 1 tablet (40 mg total) by mouth Two (2) times a day., Disp: 60 tablet, Rfl: 11  ???  losartan (COZAAR) 100 MG tablet, Take 1 tablet (100 mg total) by mouth daily., Disp: 30 tablet, Rfl: 11  ???  multivitamin (TAB-A-VITE/THERAGRAN) per tablet, Take 1 tablet by mouth daily., Disp: , Rfl:   ???  omeprazole (PRILOSEC) 20 MG capsule, Take 1 capsule (20 mg total) by mouth daily., Disp: 90 capsule, Rfl: 3  ???  oxyCODONE-acetaminophen (PERCOCET) 5-325 mg per tablet, Take 1 tablet by mouth every six (6) hours as needed for pain., Disp: 120 tablet, Rfl: 0  ???  simvastatin (ZOCOR) 10 MG tablet, Take 1 tablet (10 mg total) by mouth daily., Disp: 90 tablet, Rfl: 1  ???  tocilizumab (ACTEMRA) 162 mg/0.9 mL Syrg subcutaneous injection, Inject 0.9 mL (162 mg total) under the skin every seven (7) days., Disp: 4 Syringe, Rfl: 6  ???  triamcinolone (KENALOG) 0.1 % cream, Apply topically Two (2) times a day., Disp: 60 g, Rfl: 5    Allergies  Patient has no known allergies.    Family History   Problem Relation Age of Onset   ??? Thyroid disease Sister    ??? Cancer Brother    ??? Glaucoma Neg Hx    ??? Macular degeneration Neg Hx    ??? Retinal detachment Neg Hx        Social History  Social History   Substance Use Topics   ??? Smoking status: Current Every Day Smoker     Packs/day: 0.50     Years: 3.00   ??? Smokeless tobacco: Never Used   ??? Alcohol use 2.4 oz/week     4 Cans of beer per week       Review of Systems  Constitutional: Negative for fever.  Cardiovascular: Negative for chest pain.  Respiratory: Negative for shortness of breath.  Gastrointestinal: +constipation, +black stool  Genitourinary: Negative for dysuria.  Neurological: Negative for headaches, weakness or numbness.  All other systems reviewed and otherwise negative       Physical Exam     VITAL SIGNS:    ED Triage Vitals [12/15/17 0036]   Enc Vitals Group      BP 175/76      Heart Rate 83      SpO2 Pulse Resp 20  Temp 36.5 ??C (97.7 ??F)      Temp Source Oral      SpO2 92 %      Weight       Height       Head Circumference       Peak Flow       Pain Score       Pain Loc       Pain Edu?       Excl. in GC?        Constitutional: Alert and oriented. Well appearing and in no distress.  Eyes: Conjunctivae are normal.  ENT       Head: Normocephalic and atraumatic.       Nose: No congestion.       Mouth/Throat: Mucous membranes are moist.  Hematological/Lymphatic/Immunilogical: No lymphadenopathy appreciated.  Cardiovascular: Normal rate, regular rhythm. Normal and symmetric distal pulses are present in all extremities.  Respiratory: Clear to auscultation bilaterally with no wheezes/rales/rhonchi appreciated.    Gastrointestinal: Soft, mildly distended with TTP in the midepigastrium  with no rebound or guarding.    Musculoskeletal: Nontender with normal range of motion in all extremities.  Back:  No CVA TTP.    Neurologic: Normal speech and language. No gross focal neurologic deficits are appreciated.  Skin: Skin is warm, dry and intact. No rash noted.  Psychiatric: Mood and affect are normal. Speech and behavior are normal.  Rectal exam:  No large stool present in rectal vault.  Small amount of liquidy black stool hemoccult positive.      EKG     Sinus rhythm, rate of 69, Normal PR, Normal QRS, Normal QTC.  1mm ST depression in V5/V6 with T wave inversions.  Unable to view priors.      Radiology     CT Abdomen Pelvis:  Impression     -- Duodenal wall thickening and fat stranding concerning for duodenitis.       I directly visualized and independently interpreted the EKG tracing.   I independently visualized the radiology images.   I reviewed the patient's prior medical records.   I discussed the case and plan for continuity of care with the admitting provider.     Pertinent labs & imaging results that were available during my care of the patient were reviewed by me and considered in my medical decision making (see chart for details).       Merrie Roof, MD  12/15/17 [phone]

## 2017-12-15 NOTE — Unmapped (Signed)
[facility] TREATMENT PLAN NOTE    Called by Inpatient Geriatrics team. This is a 83 year old gentleman with PMH of giant cell arteritis, on high dose steroids for acute vision loss, CAD s/p CABG x 3 in 2001, on ASA daily, back pain on chronic ibuprofen, HTN, who presented to [facility] with a 1 day history of melena, and epigastric abdominal pain. Hgb drop from 14g/dl two weeks ago to 10, to 8.2 today. Patient hemodynamically stable, with no further melena. ASA and ibuprofen held. Continues to require steroids. Started on PPI BID. CT with findings of duodenal thickening ? Duodenitis, but in this clinical setting, is concerning for duodenal ulcer bleed.     Discussed with attending, Dr. Raphael Gibney, and feel this patient warrants inpatient endoscopy for evaluation/treatment of upper GI bleed, and transfer to [facility]. EGD unable to be performed at [facility] this afternoon, as patient has eaten today. Recommend keeping patient on clears this evening, with plan for non-emergent EGD at [facility] in next day or so - this can be performed non-emergently while patient is an inpatient, as long as patient continues to be hemodynamically stable. However, due to age/co-morbidities/hgb drop indicating increased risk of re-bleed - despite hemodynamic stability- we do not feel outpatient management is feasible/safe.     Due to limited staffing in evenings/on holidays at [facility], strongly recommended transfer to [facility], as patient may re-bleed emergently in the evening/overnight, and we have no capability to perform EGD in an emergent manner at [facility], in addition, IR services are also limited.    Recommendations:  - initiate transfer to [facility]  - continue protonix 40mg  IV q12hr  - type and screen, transfuse to keep hgb >7  - two large bore IVs  - avoid ibuprofen/NSAIDs, ASA, we understand need to keep steroids on    Discussed with attending, Dr. Raphael Gibney.    Please page [phone] (8am-5pm) if further questions arise, or [phone] on evenings/weekends/holidays with further questions/changes in clinical condition.    Karma Ganja, MD  Gastroenterology Fellow  [place] [facility] [place] [facility]

## 2017-12-15 NOTE — Unmapped (Signed)
Patient returned from CT Scan  Transported by Radiology  How tranported Stretcher  Cardiac Monitor no

## 2017-12-15 NOTE — Unmapped (Signed)
Pt with bruise to R AC due to failed IV attempt.

## 2017-12-15 NOTE — Unmapped (Signed)
Care Management  Initial Transition Planning Assessment              Care Manager assessed the patient by : In person interview with patient, Medical record review, Discussion with Clinical Care team  Orientation Level: Oriented X4; CAM -   Who provides care at home?: Family member, Other (Comment) (Pt is independent at baseline. He stated he lives with daughter, Erik [name], due to being legally blind in left eye. )  Contact/Decision Maker:    Contact Details  Contact Details: Primary Contact  Primary Contact Name: Dallas Breeding   Primary Contact Relationship: Daughter  Phone #1: [phone]  Pt has a daughter, Erik [name], with whom he lives and 2 adult sons, Theodis Blaze and [place].   Advance Directive (Medical Treatment)  Does patient have an advance directive covering medical treatment?: Portable DNR  Type of advance directive: :  Portable DNR  Information provided on advance directive:: Yes  Patient requests assistance:: No       Patient Information:    Lives with: Family members    Type of Residence: Private residence        Location/Detail: [address] [place]  [address]    Support Systems: Family Members    Responsibilities/Dependents at home?: No    Home Care services in place prior to admission?: No       Currently receiving outpatient dialysis?: No       Financial Information:          Need for financial assistance?: No       Discharge Needs Assessment:    Concerns to be Addressed: no discharge needs identified    Clinical Risk Factors: > 65, Multiple Diagnoses (Chronic)    Barriers to taking medications: No    Prior overnight hospital stay or ED visit in last 90 days: No    Readmission Within the Last 30 Days: no previous admission in last 30 days         Anticipated Changes Related to Illness: none    Equipment Needed After Discharge: none    Discharge Facility/Level of Care Needs:      Patient at risk for readmission?: No    Discharge Plan:    Screen findings are: Care Manager reviewed the plan of the patient's care with the Multidisciplinary Team. No discharge planning needs identified at this time. Care Manager will continue to manage plan and monitor patient's progress with the team.    Expected Discharge Date: 12/15/17    Expected Transfer from Critical Care:      Patient and/or family were provided with choice of facilities / services that are available and appropriate to meet post hospital care needs?: N/A       Initial Assessment complete?: Yes

## 2017-12-15 NOTE — Unmapped (Signed)
Got a phone call from [name] the patient had been admitted for duodenitis and acute GI bleed with melena.  The concern was in the setting of taking steroids as well as ibuprofen if we can decrease his steroid or use a steroid sparing agent.  Looking over the notes this is a patient of Erik [name] and when she last saw him she tapered her steroids to 50 mg of prednisone and he was supposed to initiate Actemra.  Patient had been seen in the hospital for GCA and has had acute vision loss.  I spoke with the team and told him that tapering the steroids too quickly due to his pathology.  If we can control his GI bleeding without having to steroids will be the best option at this time and also recommended checking an ESR and CRP.       I told the team to inform me of patient's prognosis and he may need an endoscopy for further evaluation but I will defer that to GI.

## 2017-12-15 NOTE — Unmapped (Signed)
Pt lying quietly on stretcher with eyes closed.

## 2017-12-15 NOTE — Unmapped (Signed)
Problem: Patient Care Overview  Goal: Plan of Care Review  All VSS, denies pain, no further S and S of bleeding, please see flow sheets for details.

## 2017-12-16 LAB — BASIC METABOLIC PANEL
ANION GAP: 6 mmol/L — ABNORMAL LOW (ref 9–15)
BLOOD UREA NITROGEN: 15 mg/dL (ref 7–21)
BUN / CREAT RATIO: 23
CALCIUM: 8.7 mg/dL (ref 8.5–10.2)
CHLORIDE: 96 mmol/L — ABNORMAL LOW (ref 98–107)
CO2: 28 mmol/L (ref 22.0–30.0)
EGFR MDRD NON AF AMER: >=60 mL/min/{1.73_m2} (ref >=60)
POTASSIUM: 4.1 mmol/L (ref 3.5–5.0)
SODIUM: 130 mmol/L — ABNORMAL LOW (ref 135–145)

## 2017-12-16 LAB — HEMOGLOBIN
Lab: 8.6 — ABNORMAL LOW
Lab: 8.6 — ABNORMAL LOW
Lab: 9 — ABNORMAL LOW

## 2017-12-16 LAB — CBC
HEMATOCRIT: 27.2 % — ABNORMAL LOW (ref 41.0–53.0)
HEMOGLOBIN: 9 g/dL — ABNORMAL LOW (ref 13.5–17.5)
MEAN CORPUSCULAR HEMOGLOBIN CONC: 33.2 g/dL (ref 31.0–37.0)
MEAN CORPUSCULAR VOLUME: 105.3 fL — ABNORMAL HIGH (ref 80.0–100.0)
MEAN PLATELET VOLUME: 7.8 fL (ref 7.0–10.0)
PLATELET COUNT: 268 10*9/L (ref 150–440)
WBC ADJUSTED: 9.6 10*9/L (ref 4.5–11.0)

## 2017-12-16 LAB — MAGNESIUM: Magnesium:MCnc:Pt:Ser/Plas:Qn:: 2

## 2017-12-16 LAB — CREATININE: Creatinine:MCnc:Pt:Ser/Plas:Qn:: 0.65 — ABNORMAL LOW

## 2017-12-16 NOTE — Unmapped (Addendum)
Lenzy Kerschner is an 83 year-old male with a PMHx significant for CAD (s/p CABG in 2001 on ASA), COPD, hypertension, EtOH use, chronic back pain (with regular ibuprofen use) and recently diagnosed giant cell arteritis (for which he was started on high-dose prednisone, but failed to initiate recommended PPI) who presented for evaluation following two episodes of melena. His presentation was notable for hemodynamic stability but a hemoglobin of 10.2 from a recent (11/28/2017) baseline of 14.5. His platelets and INR were normal. He has been treated with twice daily IV Protonix, and has remained hemodynamically stable and well-appearing albeit with mild epigastric comfort that improves postprandially. He has not had nausea or vomiting/hematemesis. He has not had a bowel movement since admission. Repeat hemoglobin, however, was 8.9. Discussion with GI consultants raised considerable concern for the presence of a duodenal ulcer that would be high risk for spontaneous (re)bleed, so a recommendation was made for patient transfer to the main hospital for improved access to endoscopy and/or VIR in the event of clinical decompensation. High-dose (50 mg daily) prednisone was continued at the recommendation of rheumatology due to the high risk for, and potentially high morbidity of, a flare of his vasculitis steroids were tapered more quickly.    After being transferred to [facility] main, the patient underwent EGD which found large duodenal ulcer. The patient was then put on a PPI IV drip for a total of 72 hours. H. Pylori stool antigen testing showed ***. Pantoprazole 40mg  BID ($2 rx)

## 2017-12-16 NOTE — Unmapped (Signed)
Transfer Note    Transfer Summary: Erik [name] is an 83 year-old male with a PMHx significant for CAD (s/p??CABG in 2001 on ASA), COPD, hypertension, EtOH use, chronic back pain (with regular ibuprofen use)??and recently diagnosed giant cell arteritis (for which he was started on high-dose prednisone, but failed to initiate recommended PPI) who presented for evaluation following two??episodes of melena. His presentation was notable for hemodynamic stability but a hemoglobin of 10.2 from a recent (11/28/2017) baseline??of 14.5. His platelets and INR were normal. He has been treated with twice daily IV Protonix, and??has remained hemodynamically stable and well-appearing albeit with mild epigastric comfort that improves postprandially. He has not had nausea or vomiting/hematemesis. He has not had a bowel movement since admission. Repeat hemoglobin, however, was 8.9. Discussion with GI consultants raised considerable concern for the presence of a duodenal ulcer that would be high risk for spontaneous (re)bleed, so a recommendation was made for patient transfer to the main hospital for improved access to endoscopy and/or VIR in the event of clinical decompensation. High-dose (50 mg daily) prednisone was continued at the recommendation of rheumatology due to the??high risk for, and potentially high morbidity of,??a flare of his vasculitis steroids were tapered more quickly.    Assessment/Plan:    Principal Problem:    Duodenitis  Active Problems:    Essential hypertension, benign    Chronic back pain    CAD (coronary artery disease), autologous vein bypass graft    Tobacco abuse    Giant cell arteritis (CMS-HCC)    Melena    Constipation  Resolved Problems:    * No resolved hospital problems. *        Pressure Ulcer(s)    Active Pressure Ulcer     None                 Erik [name] is a 83 y.o. 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  ??  UGIB: Epigastric pain with new melena, drop in hemoglobin from 14.5-->10.2 in 2 weeks, hemoccult positive stool in the setting of high dose prednisone for recently dx giant cell arteritis, aspirin, ibuprofen, and alcohol use. He did not start a PPI due to cost. CT A/P obtained in the ED showed duodenitis. Currently HDS.  Discussed case with on-call [place] [facility] team, who felt he warranted a transfer to [facility] given his high risk for emergent endoscopy, which was not available at [facility] for the next few days. See rheum below, but will continue prednisone.  - Trend Hb ~q12h, maintain T&S, 2 large bore IVs  - IV Protonix BID  - Hold ASA for now, 17 years since CABG  - Continue clear liquid diet  ????   Giant Cell Arteritis: Diagnosed with temporal biopsy after left visual loss., started on Prednisone 60 mg daily in early November 2018, now tapered to 50 mg daily for 1 month (12/7-1/7) then 40 mg daily until follow up. Planning to start Tocilizumab once approved.  Given visual loss, rheumatology feels strongly about continuing prednisone.  - Continue Prednisone 50 mg daily  - Calcium and vitamin D supplementation    Cough: Multiple sick contacts, denies fever but endorses cough productive of yellow sputum. Leukocytosis in the setting of prednisone use. Denies a  chronic cough. Carries a dx of COPD, but no formal PFTs documented and not on an inhalers. CXR w/o consolidation, no inc oxygen req.  - Duonebs PRN    HTN: Hypertensive in ED, on an odd home regimen of hydralazine 25 mg once daily, lisinopril 40 mg daily, losartan 100 mg daily, atenolol 50 mg BID per records, he does not remember all his medications.  - Continue atenolol 50 mg BID, can consider changing to coreg for better BP control  - Continue losartan 100 mg daily  - Hydralazine 25 mg once daily, pending BP consider increasing to more normal dose of TID or at least BID  - Hold lisinopril while on losartan   ??  Chronic medical problems:  CAD s/p 3v CABG: Hold ASA, Continue statin, BB  Alcohol use: Drinks up to 4 beers a day, CIWA scoring  Tobacco use: Active smoker, declined nicotine patch.   Constipation: Miralax daily  Chronic back pain: Continue home oxycodone-acetaminophen 5-325 mg q6h prn, verified in PDMP Report  ___________________________________________________________________    Subjective:  See transfer summary above. No complaints on arrival.    Recent Results (from the past 24 hour(s))   ECG 12 Lead    Collection Time: 12/15/17  1:10 AM   Result Value Ref Range    EKG Systolic BP  mmHg    EKG Diastolic BP  mmHg    EKG Ventricular Rate 69 BPM    EKG Atrial Rate 69 BPM    EKG P-R Interval 158 ms    EKG QRS Duration 96 ms    EKG Q-T Interval 402 ms    EKG QTC Calculation 430 ms    EKG Calculated P Axis 75 degrees    EKG Calculated R Axis 76 degrees    EKG Calculated T Axis 134 degrees   Urinalysis with Culture Reflex    Collection Time: 12/15/17  1:52 AM   Result Value Ref Range    Color, UA Yellow     Clarity, UA Clear     Specific Gravity, UA 1.010 1.005 - 1.040    pH, UA 7.0 5.0 - 9.0    Leukocyte Esterase, UA Negative Negative    Nitrite, UA Negative Negative    Protein, UA Negative Negative    Glucose, UA Negative Negative    Ketones, UA Negative Negative    Urobilinogen, UA 0.2 mg/dL 0.2 - 2.0 mg/dL    Bilirubin, UA Negative Negative    Blood, UA Negative Negative    RBC, UA <1 <3 /HPF    WBC, UA <1 <2 /HPF    Squam Epithel, UA <1 0 - 5 /HPF    Bacteria, UA None Seen None Seen /HPF   Comprehensive metabolic panel    Collection Time: 12/15/17  2:06 AM   Result Value Ref Range    Sodium 133 (L) 135 - 145 mmol/L    Potassium 4.6 3.5 - 5.0 mmol/L    Chloride 99 98 - 107 mmol/L    CO2 28.0 22.0 - 30.0 mmol/L    BUN 27 (H) 7 - 21 mg/dL    Creatinine 9.62 9.52 - 1.30 mg/dL    BUN/Creatinine Ratio 28     EGFR MDRD Non Af Amer >=60 >=60 mL/min/1.16m2    EGFR MDRD Af Amer >=60 >=60 mL/min/1.16m2    Anion Gap 6 (L) 9 - 15 mmol/L    Glucose 124 65 - 179 mg/dL    Calcium 9.3 8.5 - 84.1 mg/dL    Albumin  3.5 3.5 - 5.0 g/dL    Total Protein 6.0 (L) 6.5 - 8.3 g/dL    Total Bilirubin 0.3 0.0 - 1.2 mg/dL    AST 26 19 - 55 U/L    ALT 31 19 - 72 U/L    Alkaline Phosphatase 81 38 - 126 U/L   Lipase    Collection Time: 12/15/17  2:06 AM   Result Value Ref Range    Lipase 38 (L) 44 - 232 U/L   Protime-INR    Collection Time: 12/15/17  2:06 AM   Result Value Ref Range    PT 9.8 (L) 10.2 - 12.8 sec    INR 0.86    APTT    Collection Time: 12/15/17  2:06 AM   Result Value Ref Range    APTT 27.1 (L) 27.7 - 37.7 sec    Heparin Correlation <0.2    Type and Screen    Collection Time: 12/15/17  2:06 AM   Result Value Ref Range    Blood Type O POS     Antibody Screen NEG    Troponin I    Collection Time: 12/15/17  2:06 AM   Result Value Ref Range    Troponin I <0.060 <0.060 ng/mL   CBC w/ Differential    Collection Time: 12/15/17  2:06 AM   Result Value Ref Range    WBC 16.0 (H) 4.5 - 11.0 10*9/L    RBC 2.82 (L) 4.50 - 5.90 10*12/L    HGB 10.2 (L) 13.5 - 17.5 g/dL    HCT 13.0 (L) 86.5 - 53.0 %    MCV 104.6 (H) 80.0 - 100.0 fL    MCH 36.1 (H) 26.0 - 34.0 pg    MCHC 34.5 31.0 - 37.0 g/dL    RDW 78.4 69.6 - 29.5 %    MPV 7.8 7.0 - 10.0 fL    Platelet 314 150 - 440 10*9/L    Neutrophil Left Shift 2+ (A) Not Present    Absolute Neutrophils 13.7 (H) 2.0 - 7.5 10*9/L    Absolute Lymphocytes 1.2 (L) 1.5 - 5.0 10*9/L    Absolute Monocytes 0.6 0.2 - 0.8 10*9/L    Absolute Eosinophils 0.1 0.0 - 0.4 10*9/L    Absolute Basophils 0.0 0.0 - 0.1 10*9/L    Macrocytosis Moderate (A) Not Present   Basic metabolic panel    Collection Time: 12/15/17  8:23 AM   Result Value Ref Range    Sodium 133 (L) 135 - 145 mmol/L    Potassium 3.9 3.5 - 5.0 mmol/L    Chloride 100 98 - 107 mmol/L    CO2 28.0 22.0 - 30.0 mmol/L    BUN 22 (H) 7 - 21 mg/dL    Creatinine 2.84 1.32 - 1.30 mg/dL    BUN/Creatinine Ratio 26     EGFR MDRD Non Af Amer >=60 >=60 mL/min/1.76m2    EGFR MDRD Af Amer >=60 >=60 mL/min/1.63m2    Anion Gap 5 (L) 9 - 15 mmol/L    Glucose 129 65 - 179 mg/dL    Calcium 8.8 8.5 - 44.0 mg/dL   ABO/RH    Collection Time: 12/15/17  8:23 AM   Result Value Ref Range    Reject/Recollect REJECT    C-reactive protein    Collection Time: 12/15/17  8:23 AM   Result Value Ref Range    CRP 9.9 <10.0 mg/L   Hemoglobin    Collection Time: 12/15/17 11:43 AM   Result Value Ref Range  HGB 8.9 (L) 13.5 - 17.5 g/dL   Sedimentation rate, manual    Collection Time: 12/15/17 11:43 AM   Result Value Ref Range    Sed Rate 45 (H) 0 - 20 mm/h     Labs/Studies:  Labs and Studies from the last 24hrs per EMR and Reviewed    Objective:  Temp:  [35.8 ??C-36.7 ??C] 36.7 ??C  Heart Rate:  [68-83] 68  Resp:  [18-21] 18  BP: (144-178)/(62-76) 144/65  SpO2:  [91 %-98 %] 91 %    GEN: A&Ox3, NAD, lying in hospital bed  HEENT: MMM, sclerae anicteric  CV: RRR s1 s2 no m/g/r, no peripheral edema  LUNGS: CTA bilat, good air movement  ABD: soft, NT, ND, no masses  NEURO: alert and interactive, face symmetric, MAEE      ATTENDING ATTESTATION -- I saw and evaluated the patient with the Med W residents on the morning of 12/16/2017.  I reviewed the findings and plan with the Med [facility] residents.  I agree with the findings and plan as outlined in the above Transfer Accept Note.  Dulcy Fanny, MD

## 2017-12-16 NOTE — Unmapped (Signed)
Brief Transfer Summary:    Erik [name] is an 83 year-old male with a PMHx significant for CAD (s/p CABG in 2001 on ASA), COPD, hypertension, EtOH use, chronic back pain (with regular ibuprofen use) and recently diagnosed giant cell arteritis (for which he was started on high-dose prednisone, but failed to initiate recommended PPI) who presented for evaluation following two episodes of melena. His presentation was notable for hemodynamic stability but a hemoglobin of 10.2 from a recent (11/28/2017) baseline of 14.5. His platelets and INR were normal. He has been treated with twice daily IV Protonix, and has remained hemodynamically stable and well-appearing albeit with mild epigastric comfort that improves postprandially. He has not had nausea or vomiting/hematemesis. He has not had a bowel movement since admission. Repeat hemoglobin, however, was 8.9. Discussion with GI consultants raised considerable concern for the presence of a duodenal ulcer that would be high risk for spontaneous (re)bleed, so a recommendation was made for patient transfer to the main hospital for improved access to endoscopy and/or VIR in the event of clinical decompensation. High-dose (50 mg daily) prednisone was continued at the recommendation of rheumatology due to the high risk for, and potentially high morbidity of, a flare of his vasculitis steroids were tapered more quickly.

## 2017-12-16 NOTE — Unmapped (Signed)
GASTROENTEROLOGY INPATIENT CONSULTATION H&P      Requesting Attending Physician:  Stefano Gaul, MD  Requesting Consult Service: MDW    Reason for Consult:    Erik [name] is a 83 y.o. male seen in consultation at the request of Dr. Stefano Gaul, MD for upper GI bleed.    ASSESSMENT / PLAN   83 year-old male with a PMHx significant for CAD (s/p??CABG in 2001 on ASA), COPD, hypertension, EtOH use, chronic back pain (with regular ibuprofen use)??and recently diagnosed giant cell arteritis (for which he was started on high-dose prednisone, but failed to initiate recommended PPI) who presented for evaluation following two??episodes of melena and found to have acute on chronic anemia 14 -> 8 with duodenal wall thickening raising concern for duodenal ulcer.  Remained HDS at [facility] and on BID PPI without a bowel movement in 48 hrs making active bleed less likely.  Certainly given his need for prolonged steroids it warrants EGD evaluation inpatient for potential ulcers (NSAIDs vs H pylori vs malignant). Had normal TTE 12/21.    RECOMMENDATIONS:  - N.p.o. at midnight for upper EGD with biopsy on 12/17/2017.  - Okay to continue regular diet until midnight  - BID PPI, hold NSAIDS anticoagulation  - Hct q12h; transfuse <7  - Hyponatremia workup and try to improve prior to procedure  - Agree with CIWA protocol if any indication of alcohol withdrawal please alert luminal team prior to procedure  - If brisk bleeding occurs, please contact the GI team for potential EGD intervention and also give IR a call as duodenal ulcers could erode into GDA.    Thank you for this consult. Seen and discussed with Dr. Donnald Garre. We will continue to follow along. Please page the GI Luminal consult pager at [phone] with any further questions.    Orion Crook. Maren Beach, MD MPH  Gastroenterology Fellow    SUBJECTIVE:     Chief Complaint: Dark stool    History of Present Illness:   This is a 83 y.o. Patient presented to [facility] after 2 days of melena.  Felt overall weak at the time.  No bowel movement since.  No chest pain.  Drinks 4 beers daily.  Takes ibuprofen about 400 mg around once a week although other notes suggest increase usage.  A daily aspirin.  Was placed on high-dose steroids for giant cell arteritis started on 11/28/2017 recommended taking a PPI daily at the time but the patient has not done so 2/2 cost.  Drinks 4 beers a day.  Continued smoker.  Denies hematemesis, diarrhea, severe abdominal pain, vomiting or nausea, dizzy or lightheadedness currently.    Review of Systems:  The balance of 12 systems reviewed is negative except as noted in the HPI.     MEDICAL HISTORY:     Past Medical History:  Past Medical History:   Diagnosis Date   ??? Cataract 2005    cataract surgery   ??? COPD (chronic obstructive pulmonary disease) (CMS-HCC) 10/07/2016   ??? Hypertension    ??? Tobacco abuse 02/15/2014       Surgical History:  Past Surgical History:   Procedure Laterality Date   ??? CARDIAC VALVE REPLACEMENT     ??? CATARACT EXTRACTION     ??? CORONARY ARTERY BYPASS GRAFT  2001    x3   ??? EYE SURGERY  2005    cataracts   ??? PR REPAIR ING HERNIA,5+Y/O,REDUCIBL Left 06/19/2015    Procedure: REPAIR INITIAL INGUINAL HERNIA, AGE  5 YEARS OR OLDER; REDUCIBLE;  Surgeon: Shelda Jakes, MD;  Location: [facility] OR [facility];  Service: Trauma   ??? PR TEMPORAL ARTERY LIGATN OR BX Left 10/23/2017    Procedure: LIGATION OR BIOPSY, TEMPORAL ARTERY;  Surgeon: Vivien Rossetti, MD;  Location[facility];  Service: ENT       Family History:  The patient's family history includes Cancer in his brother; Thyroid disease in his sister..    Medications:   Current Facility-Administered Medications   Medication Dose Route Frequency Provider Last Rate Last Dose   ??? acetaminophen (TYLENOL) tablet 650 mg  650 mg Oral Q4H PRN Marguerite Olea, MD       ??? albuterol 2.5 mg /3 mL (0.083 %) nebulizer solution 2.5 mg  2.5 mg Nebulization Q4H PRN Marguerite Olea, MD       ??? amLODIPine (NORVASC) tablet 10 mg  10 mg Oral Daily Marguerite Olea, MD   10 mg at 12/16/17 0809   ??? ascorbic acid (vitamin C) (VITAMIN C) tablet 500 mg  500 mg Oral Daily Marguerite Olea, MD   500 mg at 12/16/17 0809   ??? atenolol (TENORMIN) tablet 50 mg  50 mg Oral BID Marguerite Olea, MD   50 mg at 12/16/17 0809   ??? cyanocobalamin tablet 1,000 mcg  1,000 mcg Oral Daily Marguerite Olea, MD   1,000 mcg at 12/16/17 0809   ??? hydrALAZINE (APRESOLINE) tablet 25 mg  25 mg Oral Daily Marguerite Olea, MD   25 mg at 12/16/17 0809   ??? influenza vaccine quad syringe (FLULAVAL) (6 MOS & UP) (PF) 2018-19  0.5 mL Intramuscular During hospitalization Rea [employer], MD       ??? ipratropium (ATROVENT) 0.02 % nebulizer solution 500 mcg  500 mcg Nebulization Q4H PRN Marguerite Olea, MD       ??? ketoconazole (NIZORAL) 2 % cream 1 application  1 application Topical Daily Marguerite Olea, MD   1 application at 12/16/17 [phone]   ??? losartan (COZAAR) tablet 100 mg  100 mg Oral Daily Marguerite Olea, MD   100 mg at 12/16/17 9604   ??? multivitamins, therapeutic with minerals tablet 1 tablet  1 tablet Oral Daily Marguerite Olea, MD   1 tablet at 12/16/17 0810   ??? oxyCODONE-acetaminophen (PERCOCET) 5-325 mg tablet 1 tablet  1 tablet Oral Q6H PRN Marguerite Olea, MD       ??? pantoprazole (PROTONIX) injection 40 mg  40 mg Intravenous BID Marguerite Olea, MD   40 mg at 12/16/17 5409   ??? polyethylene glycol (MIRALAX) packet 17 g  17 g Oral Daily Marguerite Olea, MD   17 g at 12/16/17 8119   ??? predniSONE (DELTASONE) tablet 50 mg  50 mg Oral Daily Marguerite Olea, MD   50 mg at 12/16/17 0809   ??? simvastatin (ZOCOR) tablet 10 mg  10 mg Oral Nightly Marguerite Olea, MD   10 mg at 12/15/17 2036   ??? sodium chloride 0.9% (NS BOLUS) bolus 500 mL  500 mL Intravenous Once Avelino Leeds, MD           Allergies:   Patient has no known allergies.    Social History:  Social History   Substance Use Topics   ??? Smoking status: Current Every Day Smoker Packs/day: 0.50     Years: 4.00   ??? Smokeless tobacco: Never Used  Comment: quit for 25 years, restarted 2014   ??? Alcohol use 2.4 oz/week     4 Cans of beer per week       Objective:      Vital Signs/Weight:  Temp:  [35.6 ??C-36.4 ??C] 35.9 ??C  Heart Rate:  [61-65] 62  Resp:  [16-18] 18  BP: (142-155)/(63-67) 155/67  MAP (mmHg):  [94] 94  SpO2:  [90 %-97 %] 90 %  Wt Readings from Last 3 Encounters:   12/15/17 57.5 kg (126 lb 12.8 oz)   11/28/17 58.1 kg (128 lb)   10/23/17 56.2 kg (123 lb 14.4 oz)       Physical Exam:  Constitutional: Elderly man in no acute distress  HEENT: Blind in left eye CV: Regular rate and rhythm, normal S1, S2. No murmurs. No JVD.  Able to lie flat  Lung: Unlabored breathing.   Abdomen: soft, normal bowel sounds, non-distended, non-tender. No organomegaly. No ascites.   Extremities: No edema, well perfused  MSK: No joint swelling or tenderness noted, no deformities  Skin: No rashes, jaundice or skin lesions noted  Neuro: Alert, Oriented x 3, No focal deficits. No asterixis.   Mental Status: Thought organized, appropriate affect, pleasantly interactive, not anxious appearing      DIAGNOSTIC STUDIES     I reviewed all pertinent diagnostic studies, including:      Labs:    Recent Labs      12/15/17   0206  12/15/17   1143  12/15/17   2336  12/16/17   0713   WBC  16.0*   --    --   9.6   HGB  10.2*  8.9*  8.6*  9.0*   HCT  29.5*   --    --   27.2*   PLT  314   --    --   268     Recent Labs      12/15/17   0206  12/15/17   0823  12/16/17   0713   NA  133*  133*  130*   K  4.6  3.9  4.1   CL  99  100  96*   BUN  27*  22*  15   CREATININE  0.98  0.85  0.65*   GLU  124  129  91     Recent Labs      12/15/17   0206   PROT  6.0*   ALBUMIN  3.5   AST  26   ALT  31   ALKPHOS  81   BILITOT  0.3     Recent Labs      12/15/17   0206   INR  0.86   APTT  27.1*     Recent Labs      12/15/17   0823   CRP  9.9     No results for input(s): IRON, TIBC, FERRITIN in the last 72 hours.    Imaging:   Radiology studies were personally reviewed   CT abdomen pelvis from 12/14/2017  Duodenal wall thickening and fat stranding concerning for duodenitis.  Gastric tube thank you please keep it back to her Katrinka Blazing    TTE:  ?? Technically difficult study due to chest wall/lung interference  ?? Normal left ventricular systolic function, ejection fraction > 55%  ?? Normal right ventricular systolic function

## 2017-12-16 NOTE — Unmapped (Signed)
Problem: Patient Care Overview  Goal: Plan of Care Review  Outcome: Progressing   12/16/17 0102   OTHER   Plan of Care Reviewed With patient   Plan of Care Review   Progress improving     Alert,oriented x 4. Vss. Afebrile. Wbc 16.0. Moist cough productive of yellow sputum. Reports 2 black stools yesterday-no stools since transfer from [facility]. Last hgb 8. Denies pain. Sleeping when undisturbed.    Goal: Individualization and Mutuality  Outcome: Progressing   12/16/17 0102   Individualization   Patient Specific Preferences Like room warm   Patient Specific Goals (Include Timeframe) GIB resolved prior to discharge   Patient Specific Interventions Safety measures. Bleeding ,Falls Precautions and CIWA monitoring       Problem: Self-Care Deficit (Adult,Obstetrics,Pediatric)  Goal: Identify Related Risk Factors and Signs and Symptoms  Related risk factors and signs and symptoms are identified upon initiation of Human Response Clinical Practice Guideline (CPG).   Outcome: Progressing   12/16/17 0102   Self-Care Deficit (Adult,Obstetrics,Pediatric)   Related Risk Factors (Self-Care Deficit) musculoskeletal disorder   Signs and Symptoms (Self-Care Deficit) decreased functional activity tolerance     Goal: Improved Ability to Perform BADL and IADL  Patient will demonstrate the desired outcomes by discharge/transition of care.   Outcome: Progressing      Problem: Fall Risk (Adult)  Goal: Identify Related Risk Factors and Signs and Symptoms  Related risk factors and signs and symptoms are identified upon initiation of Human Response Clinical Practice Guideline (CPG).   Outcome: Progressing   12/16/17 0102   Fall Risk (Adult)   Related Risk Factors (Fall Risk) age-related changes;environment unfamiliar   Signs and Symptoms (Fall Risk) presence of risk factors     Goal: Absence of Fall  Patient will demonstrate the desired outcomes by discharge/transition of care.   Outcome: Progressing   12/16/17 0102   Fall Risk (Adult)   Absence of Fall making progress toward outcome       Problem: Gastrointestinal Bleeding (Adult)  Goal: Signs and Symptoms of Listed Potential Problems Will be Absent, Minimized or Managed (Gastrointestinal Bleeding)  Signs and symptoms of listed potential problems will be absent, minimized or managed by discharge/transition of care (reference Gastrointestinal Bleeding (Adult) CPG).  Outcome: Progressing   12/16/17 0102   Gastrointestinal Bleeding (Adult)   Problems Assessed (GI Bleeding) all   Problems Present (GI Bleeding) situational response

## 2017-12-17 DIAGNOSIS — K922 Gastrointestinal hemorrhage, unspecified: Principal | ICD-10-CM

## 2017-12-17 LAB — BASIC METABOLIC PANEL
ANION GAP: 4 mmol/L — ABNORMAL LOW (ref 9–15)
BLOOD UREA NITROGEN: 13 mg/dL (ref 7–21)
BUN / CREAT RATIO: 17
CALCIUM: 8.8 mg/dL (ref 8.5–10.2)
CHLORIDE: 97 mmol/L — ABNORMAL LOW (ref 98–107)
CO2: 30 mmol/L (ref 22.0–30.0)
EGFR MDRD AF AMER: >=60 mL/min/{1.73_m2} (ref >=60)
EGFR MDRD NON AF AMER: >=60 mL/min/{1.73_m2} (ref >=60)
POTASSIUM: 3.7 mmol/L (ref 3.5–5.0)
SODIUM: 131 mmol/L — ABNORMAL LOW (ref 135–145)

## 2017-12-17 LAB — MAGNESIUM
MAGNESIUM: 1.8 mg/dL (ref 1.6–2.2)
Magnesium:MCnc:Pt:Ser/Plas:Qn:: 1.8

## 2017-12-17 LAB — CBC
HEMOGLOBIN: 9.8 g/dL — ABNORMAL LOW (ref 13.5–17.5)
MEAN CORPUSCULAR HEMOGLOBIN CONC: 33.8 g/dL (ref 31.0–37.0)
MEAN CORPUSCULAR HEMOGLOBIN: 35.2 pg — ABNORMAL HIGH (ref 26.0–34.0)
MEAN CORPUSCULAR VOLUME: 104 fL — ABNORMAL HIGH (ref 80.0–100.0)
MEAN PLATELET VOLUME: 7.6 fL (ref 7.0–10.0)
PLATELET COUNT: 315 10*9/L (ref 150–440)
WBC ADJUSTED: 13 10*9/L — ABNORMAL HIGH (ref 4.5–11.0)

## 2017-12-17 LAB — WBC ADJUSTED: Lab: 13 — ABNORMAL HIGH

## 2017-12-17 LAB — CO2: Carbon dioxide:SCnc:Pt:Ser/Plas:Qn:: 30

## 2017-12-17 NOTE — Unmapped (Signed)
Inpatient Surgery Update      PRE-OP DOCUMENTATION: PROVIDER CERTIFICATION    I certify that the appropriate documentation of the patient's evaluation, assessment and treatment plan is found within the medical record and that the patient???s condition is unchanged from this earlier assessment.    SITE MARKING ATTESTATION    Site Marked: Not required    CONSENT FOR OPERATION OR PROCEDURE: PROVIDER CERTIFICATION    I hereby certify that the nature, purpose, benefits, usual and most frequent risks of, and alternatives to, the operation or procedure have been explained to the patient (or person authorized to sign for the patient) either by a physician or by the provider who is to perform the operation or procedure, that the patient has had an opportunity to ask questions, and that those questions have been answered. The patient or the patient's representatiive has been advised that the selected tasks may be performed by assistants to the primary health care provider(s). I believe that the patient (or person authorized to sign for the patient) understands what has been explained, and has consented to the operation or procedure.

## 2017-12-17 NOTE — Unmapped (Signed)
Problem: Patient Care Overview  Goal: Plan of Care Review  Outcome: Progressing  Pt alert and oriented x4. VSS with no sign of bleeding noted during shift. Pt tolerated medications and remains free of falls during shift. Pt is NPO for endoscopy in the morning. Will continue to monitor.  Goal: Individualization and Mutuality  Outcome: Progressing      Problem: Self-Care Deficit (Adult,Obstetrics,Pediatric)  Goal: Identify Related Risk Factors and Signs and Symptoms  Related risk factors and signs and symptoms are identified upon initiation of Human Response Clinical Practice Guideline (CPG).   Outcome: Progressing   12/17/17 0338   Self-Care Deficit (Adult,Obstetrics,Pediatric)   Related Risk Factors (Self-Care Deficit) activity intolerance;psychosocial factor   Signs and Symptoms (Self-Care Deficit) decreased functional activity tolerance       Problem: Fall Risk (Adult)  Goal: Identify Related Risk Factors and Signs and Symptoms  Related risk factors and signs and symptoms are identified upon initiation of Human Response Clinical Practice Guideline (CPG).   Outcome: Progressing   12/17/17 0338   Fall Risk (Adult)   Related Risk Factors (Fall Risk) age-related changes;fatigue/slow reaction;impaired vision   Signs and Symptoms (Fall Risk) presence of risk factors       Problem: Gastrointestinal Bleeding (Adult)  Goal: Signs and Symptoms of Listed Potential Problems Will be Absent, Minimized or Managed (Gastrointestinal Bleeding)  Signs and symptoms of listed potential problems will be absent, minimized or managed by discharge/transition of care (reference Gastrointestinal Bleeding (Adult) CPG).   Outcome: Progressing   12/17/17 0338   Gastrointestinal Bleeding (Adult)   Problems Assessed (GI Bleeding) all   Problems Present (GI Bleeding) none       Problem: VTE, DVT and PE (Adult)  Goal: Signs and Symptoms of Listed Potential Problems Will be Absent, Minimized or Managed (VTE, DVT and PE)  Signs and symptoms of listed potential problems will be absent, minimized or managed by discharge/transition of care (reference VTE, DVT and PE (Adult) CPG).   Outcome: Progressing   12/17/17 0338   VTE, DVT and PE (Adult)   Problems Assessed (VTE, DVT, PE) all   Problems Present (VTE, DVT, PE) none

## 2017-12-17 NOTE — Unmapped (Signed)
Problem: Patient Care Overview  Goal: Plan of Care Review  Outcome: Progressing  Pt A/O x 4. VSS. Afebrile. CIWA scored 0 throughout shift. Pt spent most of shift asleep. Encouraged to participate in hygiene. Family called to say they would be coming to bed side. Didn't show during shift. PIV leaked and changed. Slow to clot but no continued bleeding with pressure. BM not noted. Hgb in 8s. KVO given. Bed in lowest, locked position. Will continue to monitor.      12/16/17 1918   OTHER   Plan of Care Reviewed With patient   Plan of Care Review   Progress improving     Goal: Individualization and Mutuality  Outcome: Progressing   12/16/17 1918   Individualization   Patient Specific Preferences pt likes layered blankets   Patient Specific Goals (Include Timeframe) pt will remain free of falls through shift   Patient Specific Interventions pt remained in bed throughout shift, falls precautions, CIWA monitoring       Problem: Self-Care Deficit (Adult,Obstetrics,Pediatric)  Goal: Identify Related Risk Factors and Signs and Symptoms  Related risk factors and signs and symptoms are identified upon initiation of Human Response Clinical Practice Guideline (CPG).   Outcome: Progressing   12/16/17 1918   Self-Care Deficit (Adult,Obstetrics,Pediatric)   Related Risk Factors (Self-Care Deficit) activity intolerance;fatigue/weakness;psychosocial factor;musculoskeletal disorder   Signs and Symptoms (Self-Care Deficit) decreased functional activity tolerance       Problem: Fall Risk (Adult)  Goal: Identify Related Risk Factors and Signs and Symptoms  Related risk factors and signs and symptoms are identified upon initiation of Human Response Clinical Practice Guideline (CPG).   Outcome: Progressing   12/16/17 1918   Fall Risk (Adult)   Related Risk Factors (Fall Risk) age-related changes;fatigue/slow reaction;impaired vision;sleep pattern alteration   Signs and Symptoms (Fall Risk) presence of risk factors       Problem: Gastrointestinal Bleeding (Adult)  Goal: Signs and Symptoms of Listed Potential Problems Will be Absent, Minimized or Managed (Gastrointestinal Bleeding)  Signs and symptoms of listed potential problems will be absent, minimized or managed by discharge/transition of care (reference Gastrointestinal Bleeding (Adult) CPG).   Outcome: Progressing   12/16/17 1918   Gastrointestinal Bleeding (Adult)   Problems Assessed (GI Bleeding) all   Problems Present (GI Bleeding) situational response

## 2017-12-18 LAB — CBC
HEMATOCRIT: 27.7 % — ABNORMAL LOW (ref 41.0–53.0)
HEMOGLOBIN: 9.2 g/dL — ABNORMAL LOW (ref 13.5–17.5)
MEAN CORPUSCULAR VOLUME: 106.1 fL — ABNORMAL HIGH (ref 80.0–100.0)
MEAN PLATELET VOLUME: 7.8 fL (ref 7.0–10.0)
PLATELET COUNT: 317 10*9/L (ref 150–440)
RED BLOOD CELL COUNT: 2.61 10*12/L — ABNORMAL LOW (ref 4.50–5.90)
RED CELL DISTRIBUTION WIDTH: 14.2 % (ref 12.0–15.0)
WBC ADJUSTED: 10.5 10*9/L (ref 4.5–11.0)

## 2017-12-18 LAB — BASIC METABOLIC PANEL
ANION GAP: 7 mmol/L — ABNORMAL LOW (ref 9–15)
BLOOD UREA NITROGEN: 12 mg/dL (ref 7–21)
CALCIUM: 8.5 mg/dL (ref 8.5–10.2)
CHLORIDE: 99 mmol/L (ref 98–107)
CO2: 26 mmol/L (ref 22.0–30.0)
EGFR MDRD AF AMER: >=60 mL/min/{1.73_m2} (ref >=60)
EGFR MDRD NON AF AMER: >=60 mL/min/{1.73_m2} (ref >=60)
GLUCOSE RANDOM: 158 mg/dL (ref 65–179)
POTASSIUM: 3.3 mmol/L — ABNORMAL LOW (ref 3.5–5.0)
SODIUM: 132 mmol/L — ABNORMAL LOW (ref 135–145)

## 2017-12-18 LAB — HEMOGLOBIN: Lab: 9.2 — ABNORMAL LOW

## 2017-12-18 LAB — MAGNESIUM: Magnesium:MCnc:Pt:Ser/Plas:Qn:: 1.7

## 2017-12-18 LAB — BUN / CREAT RATIO: Urea nitrogen/Creatinine:MRto:Pt:Ser/Plas:Qn:: 16

## 2017-12-18 MED ORDER — LANSOPRAZOLE 30 MG CAPSULE,DELAYED RELEASE
0 refills | 0 days
Start: 2017-12-18 — End: 2019-01-01

## 2017-12-18 NOTE — Unmapped (Signed)
OCCUPATIONAL THERAPY  Evaluation (12/18/17 0805)    Patient Name:  Erik [name]       Medical Record Number: [id number]   Date of Birth: [birth year]  Sex: Male            Assessment  HANNIBAL SKALLA is a 83 y.o. 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 Pt presents to acute OT near baseline for ADL performance and functional mobility. Based on the daily activity AM-PAC raw score of 24/24, the pt is considered to be 0% impaired with self care. This indicates pt does not require further acute OT services at this time. Post acute OT also not indicated. Please re-consult if change in status.          Activity Tolerance During Today's Session  Patient tolerated treatment well    Plan  Planned Frequency of Treatment:  D/C Services for: D/C Services     Post-Discharge Occupational Therapy Recommendations:  OT Post Acute Discharge Recommendations: OT services not indicated    ;    OT DME Recommendations: None    Subjective  Current Status Pt received and left supine in bed with all immediate needs met and RN Meghan aware  Prior Functional Status Pt reports independent with ADL and functional mobility, sharing cooking and cleaning responsibilities with daughter    Medical Tests / Procedures: reviewed  Services patient receives: OT;PT  Patient / Caregiver reports: I like to go to the bar and drink    Past Medical History:   Diagnosis Date   ??? Cataract 2005    cataract surgery   ??? COPD (chronic obstructive pulmonary disease) (CMS-HCC) 10/07/2016   ??? DJD (degenerative joint disease), lumbar    ??? Hypertension    ??? Tobacco abuse 02/15/2014    Social History   Substance Use Topics   ??? Smoking status: Current Every Day Smoker     Packs/day: 0.50     Years: 4.00   ??? Smokeless tobacco: Never Used Comment: quit for 25 years, restarted 2014   ??? Alcohol use 2.4 oz/week     4 Cans of beer per week      Comment: daily      Past Surgical History:   Procedure Laterality Date   ??? CARDIAC VALVE REPLACEMENT      triple bypass   ??? CATARACT EXTRACTION     ??? CORONARY ARTERY BYPASS GRAFT  2001    x3   ??? EYE SURGERY  2005    cataracts   ??? PR REPAIR ING HERNIA,5+Y/O,REDUCIBL Left 06/19/2015    Procedure: REPAIR INITIAL INGUINAL HERNIA, AGE 76 YEARS OR OLDER; REDUCIBLE;  Surgeon: Shelda Jakes, MD;  Location: [facility];  Service: Trauma   ??? PR TEMPORAL ARTERY LIGATN OR BX Left 10/23/2017    Procedure: LIGATION OR BIOPSY, TEMPORAL ARTERY;  Surgeon: Vivien Rossetti, MD;  [facility];  Service: ENT    Family History   Problem Relation Age of Onset   ??? Thyroid disease Sister    ??? Cancer Brother    ??? Glaucoma Neg Hx    ??? Macular degeneration Neg Hx    ??? Retinal detachment Neg Hx         Patient has no  known allergies.     Objective Findings  Precautions / Restrictions  Falls precautions    Weight Bearing  Non-applicable    Required Braces or Orthoses  Non-applicable    Communication Preference  Verbal    Pain  Pt c/o L sided upper abdominal pain upon arival, subsided after sitting EOB for ~5 minutes     Equipment / Environment  Vascular access (PIV, TLC, Port-a-cath, PICC)    Living Situation   Living environment: House   Lives With: Daughter (works but able to assist prn )   Home Living: One level home;Tub/shower unit;Standard height toilet;Stairs to enter with rails   Equipment available at home: None   Rail placement (outside): Bilateral rails        Cognition   Orientation Level:  Oriented x 4   Arousal/Alertness:  Appropriate responses to stimuli   Attention Span:  Appears intact   Memory:  Appears intact   Following Commands:  Follows all commands and directions without difficulty   Safety Judgment:  Good awareness of safety precautions   Awareness of Errors:  Good awareness of safety precautions Problem Solving:  Able to problem solve independently   Comments:      Vision / Perception  Vision: Wears glasses all the time  Perception: WFL  Comments: L eye- nerve burst in eye    Hand Function  Hand Dominance: R   B grip WFL     Skin Inspection  All visible skin appears intact    ROM / Strength/Coordination  UE ROM/ Strength/ Coordination: BUE WFL   LE ROM/ Strength/ Coordination: Defer to PT    Sensation:  No c/o numbness/tingling    Balance:  sitting balance=independent, standing balance=independent    Mobility/Gait/Transfers: sup-sit=independent, sit-stand=indpendent, room level functional mobility=independent    ADL:  Bathing: independent  Grooming: independent  Dressing: independent  Eating: independent  Toileting: independent         Vitals/ Orthostatics:  At Rest: NAD  With Activity: NAD  Orthostatics: NAD    Interventions Performed During Today's Session: AMPAC=24/24, role of OT, POC, safety edu, importance of OOB, functional mobility, standing ADL     OT G-codes  Functional Assessment Tool Used: AMPAC  Score: 24/24  Functional Limitation: Self care  Self Care Current Status (G2952): 0 percent impaired, limited or restricted  Self Care Goal Status (W4132): 0 percent impaired, limited or restricted  Self Care Discharge Status (G4010): 0 percent impaired, limited or restricted  Rationale: 0% ADL impairment    Eval Duration (OT): 20 Min.      I attest that I have reviewed the above information.  Signed: Clayborne Artist, OT  Filed 12/18/2017

## 2017-12-18 NOTE — Unmapped (Signed)
Problem: Patient Care Overview  Goal: Plan of Care Review  Outcome: Progressing  Pt alert and oriented x4. VSS with no c/o pain noted during shift. Pt continues to tolerate protonix gtt via IV and remains free of falls.Will continue to monitor.  Goal: Individualization and Mutuality  Outcome: Progressing      Problem: Self-Care Deficit (Adult,Obstetrics,Pediatric)  Goal: Identify Related Risk Factors and Signs and Symptoms  Related risk factors and signs and symptoms are identified upon initiation of Human Response Clinical Practice Guideline (CPG).   Outcome: Progressing   12/18/17 0412   Self-Care Deficit (Adult,Obstetrics,Pediatric)   Related Risk Factors (Self-Care Deficit) activity intolerance;psychosocial factor   Signs and Symptoms (Self-Care Deficit) decreased functional activity tolerance       Problem: Fall Risk (Adult)  Goal: Identify Related Risk Factors and Signs and Symptoms  Related risk factors and signs and symptoms are identified upon initiation of Human Response Clinical Practice Guideline (CPG).   Outcome: Progressing      Problem: Gastrointestinal Bleeding (Adult)  Goal: Signs and Symptoms of Listed Potential Problems Will be Absent, Minimized or Managed (Gastrointestinal Bleeding)  Signs and symptoms of listed potential problems will be absent, minimized or managed by discharge/transition of care (reference Gastrointestinal Bleeding (Adult) CPG).   Outcome: Progressing   12/18/17 0412   Gastrointestinal Bleeding (Adult)   Problems Assessed (GI Bleeding) all   Problems Present (GI Bleeding) none       Problem: VTE, DVT and PE (Adult)  Goal: Signs and Symptoms of Listed Potential Problems Will be Absent, Minimized or Managed (VTE, DVT and PE)  Signs and symptoms of listed potential problems will be absent, minimized or managed by discharge/transition of care (reference VTE, DVT and PE (Adult) CPG).   Outcome: Progressing   12/18/17 0412   VTE, DVT and PE (Adult)   Problems Assessed (VTE, DVT, PE) all   Problems Present (VTE, DVT, PE) none

## 2017-12-18 NOTE — Unmapped (Signed)
Problem: Patient Care Overview  Goal: Plan of Care Review  Pt is A&O x4, on RA. Pt had endo today, small hiatal hernia and duodenal ulcer found. No intervention was done. Pt started on protonix gtt. On clear liquid diet now. Pt noted to be irritable at beginning of shift. C/o 5/10 chronic back pain. PRN percocet given with good relief. CIWA continued. Score of 4 once this shift, no intervention given. VSS this shift. Will continue to monitor.   12/17/17 1732   OTHER   Plan of Care Reviewed With patient   Plan of Care Review   Progress improving     Goal: Individualization and Mutuality   12/17/17 1732   Individualization   Patient Specific Preferences pt likes curtain to remain closed at all times   Patient Specific Interventions Protonix gtt, vitals/labs monitored     Goal: Interprofessional Rounds/Family Conf   12/17/17 1732   Interdisciplinary Rounds/Family Conf   Participants case manager;nursing;patient;physician       Problem: Self-Care Deficit (Adult,Obstetrics,Pediatric)  Intervention: Support Psychosocial Response to Functional Status   12/17/17 1732   Interventions   Supportive Measures relaxation techniques promoted   Trust Relationship/Rapport care explained;thoughts/feelings acknowledged     Intervention: Promote Energy Conservation   12/17/17 1732   Interventions   Sleep/Rest Enhancement awakenings minimized       Goal: Improved Ability to Perform BADL and IADL  Patient will demonstrate the desired outcomes by discharge/transition of care.    12/17/17 1732   Self-Care Deficit (Adult,Obstetrics,Pediatric)   Improved Ability to Perform BADL and IADL making progress toward outcome       Problem: Fall Risk (Adult)  Intervention: Review Medications/Identify Contributors to Fall Risk   12/17/17 1732   Interventions   Medication Review/Management medications reviewed       Goal: Identify Related Risk Factors and Signs and Symptoms  Related risk factors and signs and symptoms are identified upon initiation of Human Response Clinical Practice Guideline (CPG).    12/17/17 1732   Fall Risk (Adult)   Signs and Symptoms (Fall Risk) presence of risk factors     Goal: Absence of Fall  Patient will demonstrate the desired outcomes by discharge/transition of care.    12/17/17 1732   Fall Risk (Adult)   Absence of Fall making progress toward outcome       Problem: Gastrointestinal Bleeding (Adult)  Goal: Signs and Symptoms of Listed Potential Problems Will be Absent, Minimized or Managed (Gastrointestinal Bleeding)  Signs and symptoms of listed potential problems will be absent, minimized or managed by discharge/transition of care (reference Gastrointestinal Bleeding (Adult) CPG).    12/17/17 1732   Gastrointestinal Bleeding (Adult)   Problems Assessed (GI Bleeding) all   Problems Present (GI Bleeding) none       Problem: VTE, DVT and PE (Adult)  Goal: Signs and Symptoms of Listed Potential Problems Will be Absent, Minimized or Managed (VTE, DVT and PE)  Signs and symptoms of listed potential problems will be absent, minimized or managed by discharge/transition of care (reference VTE, DVT and PE (Adult) CPG).    12/17/17 1732   VTE, DVT and PE (Adult)   Problems Assessed (VTE, DVT, PE) all   Problems Present (VTE, DVT, PE) none

## 2017-12-18 NOTE — Unmapped (Signed)
The Venous Access Team (VAT) received a request from care RN.   PIV placed.           PIV Workup / Procedure Time:  15 minutes      The primary RN was notified.       Thank you,     Elizabeth Palau RN Venous Access Team   2

## 2017-12-18 NOTE — Unmapped (Signed)
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 Pt presents to skilled PT services at baseline function performing all mobility independent. All questions/concerns addressed. No further PT indicated     Today's Interventions: AMPAC 24/24; Educ re; PT role, PT POC, fall risk reduction, safety and sequencing with mobility; bed mobility, transfer training, gait training, sit <> stands     Activity Tolerance: Patient tolerated treatment well    PLAN  Planned Frequency of Treatment:  D/C Services for: D/C Services      Planned Interventions:      Post-Discharge Physical Therapy Recommendations:  PT services not indicated        PT DME Recommendations: None     Goals:   Patient and Family Goals: Get stronger            SHORT GOAL #1: Independent for all transfers. Met day 1.                 SHORT GOAL #2: ambulate independently for 150'. Met day 1.                 SHORT GOAL #3: Independent with HEP. Met day 1.                                                       Prognosis:  Good     Positive Indicators: plof, family support    SUBJECTIVE  Patient reports: Pt/RN agreeable to therapy session   Current Functional Status: received supine in bed; session ended with pt supine in bed  Services patient receives: OT;PT  Prior functional status: Pt reports indep with ADLs, functional mobility and ambulation indepdent; daughter lives with patient   Equipment available at home: None    Past Medical History:   Diagnosis Date   ??? Cataract 2005    cataract surgery   ??? COPD (chronic obstructive pulmonary disease) (CMS-HCC) 10/07/2016   ??? DJD (degenerative joint disease), lumbar    ??? Hypertension    ??? Tobacco abuse 02/15/2014    Social History   Substance Use Topics   ??? Smoking status: Current Every Day Smoker     Packs/day: 0.50     Years: 4.00   ??? Smokeless tobacco: Never Used      Comment: quit for 25 years, restarted 2014   ??? Alcohol use 2.4 oz/week     4 Cans of beer per week      Comment: daily      Past Surgical History:   Procedure Laterality Date   ??? CARDIAC VALVE REPLACEMENT      triple bypass   ??? CATARACT EXTRACTION     ??? CORONARY ARTERY BYPASS GRAFT  2001    x3   ???  EYE SURGERY  2005    cataracts   ??? PR REPAIR ING HERNIA,5+Y/O,REDUCIBL Left 06/19/2015    Procedure: REPAIR INITIAL INGUINAL HERNIA, AGE 81 YEARS OR OLDER; REDUCIBLE;  Surgeon: Shelda Jakes, MD;  Location: [facility] OR [facility];  Service: Trauma   ??? PR TEMPORAL ARTERY LIGATN OR BX Left 10/23/2017    Procedure: LIGATION OR BIOPSY, TEMPORAL ARTERY;  Surgeon: Vivien Rossetti, MD;  [facility];  Service: ENT    Family History   Problem Relation Age of Onset   ??? Thyroid disease Sister    ??? Cancer Brother    ??? Glaucoma Neg Hx    ??? Macular degeneration Neg Hx    ??? Retinal detachment Neg Hx         Allergies: Patient has no known allergies.                Objective Findings              Precautions: Non-applicable              Weight Bearing Status: Non- applicable              Required Braces or Orthoses: Non- applicable    Communication Preference: Verbal;Visual  Pain Comments: denies pain  Medical Tests / Procedures: chart review per epic   Equipment / Environment: Vascular access (PIV, TLC, Port-a-cath, PICC)    At Rest: VSS per epic, RN cleared  With Activity: NAD   Orthostatics: no s/sx  Airway Clearance: mobilization    Living environment: House  Lives With: Daughter (works but able to assist prn)  Home Living: One level home;Tub/shower unit;Standard height toilet;Stairs to enter with rails  Rail placement (outside): Bilateral rails     Number of Stairs: 2    Cognition: A&Ox4  Visual / Perception Status: glasses reports he is legally blind in left eye and thats why he lives with his daughter       UE ROM: WFL  UE Strength: WFL  LE ROM: WFL  LE Strength: WFL                       Sensation: SILT   Balance: sitting EOB indep; standing indep    Posture: kyphotic thoracic      Bed Mobility: indep   Transfers: sup <> sit <> stand   Gait: ambulated 200'independently          Endurance: good    Eval Duration(PT): 30 Min.       PT G-Codes  Functional Limitation: Mobility: Walking and moving around  Mobility: Walking and Moving Around Current Status [id number]): 0 percent impaired, limited or restricted  Mobility: Walking and Moving Around Goal Status [phone]): 0 percent impaired, limited or restricted  Mobility: Walking and Moving Around Discharge Status [phone]): 0 percent impaired, limited or restricted  Rationale: Independent  I attest that I have reviewed the above information.  Signed: Daiva Huge, PT  Filed 12/18/2017

## 2017-12-18 NOTE — Unmapped (Signed)
Daily Progress Note    Assessment/Plan:    Principal Problem:    Duodenitis  Active Problems:    Essential hypertension, benign    Chronic back pain    CAD (coronary artery disease), autologous vein bypass graft    Tobacco abuse    Giant cell arteritis (CMS-HCC)    Melena    Constipation  Resolved Problems:    * No resolved hospital problems. *        Pressure Ulcer(s)    Active Pressure Ulcer     None                 LEAN Erik [name] is a 83 y.o. 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    UGIB: Epigastric pain with new melena, drop in hemoglobin from 14.5-->10.2-->8.6. EGD See rheum below, but will continue prednisone. Plan for GI evaluation here.  - Trend Hb daily, maintain T&S, 2 large bore IVs  - IV Protonix BID  - Hold ASA  - GI consult, EGD this morning  - Continue clear liquid diet  ????   Giant Cell Arteritis: Diagnosed with temporal biopsy after left visual loss., started on Prednisone 60 mg daily in early November 2018, now tapered to 50 mg daily for 1 month (12/7-1/7) then 40 mg daily until follow up. Planning to start Tocilizumab once approved.  Given visual loss, rheumatology feels strongly about continuing prednisone.  - Continue Prednisone 50 mg daily  - Calcium and vitamin D supplementation    Cough: Multiple sick contacts, denies fever but endorses cough productive of yellow sputum. Leukocytosis in the setting of prednisone use. Denies a chronic cough. Carries a dx of COPD, but no formal PFTs documented and not on an inhalers. CXR w/o consolidation, no inc oxygen req.  - Duonebs PRN    HTN: Hypertensive in ED, on an odd home regimen of hydralazine 25 mg once daily, lisinopril 40 mg daily, losartan 100 mg daily, atenolol 50 mg BID per records, he does not remember all his medications.  - Continue atenolol 50 mg BID, can consider changing to coreg for better BP control  - Continue losartan 100 mg daily  - Hydralazine 25 mg once daily, pending BP consider increasing to more normal dose of TID or at least BID  - Hold lisinopril while on losartan   ??  Chronic medical problems:  CAD s/p 3v CABG: Hold ASA, Continue statin, BB  Alcohol use: Drinks up to 4 beers a day, CIWA scoring  Tobacco use: Active smoker, declined nicotine patch.   Constipation: Miralax daily  Chronic back pain: Continue home oxycodone-acetaminophen 5-325 mg q6h prn, verified in PDMP Report  ___________________________________________________________________    Subjective:  No acute events overnight. No dark stools this morning.    Recent Results (from the past 24 hour(s))   Magnesium Level    Collection Time: 12/18/17  7:53 AM   Result Value Ref Range    Magnesium 1.7 1.6 - 2.2 mg/dL   Basic metabolic panel    Collection Time: 12/18/17  7:53 AM   Result Value Ref Range    Sodium 132 (L) 135 - 145 mmol/L    Potassium 3.3 (L) 3.5 - 5.0 mmol/L    Chloride 99 98 -  107 mmol/L    CO2 26.0 22.0 - 30.0 mmol/L    BUN 12 7 - 21 mg/dL    Creatinine 1.61 0.96 - 1.30 mg/dL    BUN/Creatinine Ratio 16     EGFR MDRD Non Af Amer >=60 >=60 mL/min/1.24m2    EGFR MDRD Af Amer >=60 >=60 mL/min/1.50m2    Anion Gap 7 (L) 9 - 15 mmol/L    Glucose 158 65 - 179 mg/dL    Calcium 8.5 8.5 - 04.5 mg/dL   CBC    Collection Time: 12/18/17  7:53 AM   Result Value Ref Range    WBC 10.5 4.5 - 11.0 10*9/L    RBC 2.61 (L) 4.50 - 5.90 10*12/L    HGB 9.2 (L) 13.5 - 17.5 g/dL    HCT 40.9 (L) 81.1 - 53.0 %    MCV 106.1 (H) 80.0 - 100.0 fL    MCH 35.2 (H) 26.0 - 34.0 pg    MCHC 33.1 31.0 - 37.0 g/dL    RDW 91.4 78.2 - 95.6 %    MPV 7.8 7.0 - 10.0 fL    Platelet 317 150 - 440 10*9/L     Labs/Studies:  Labs and Studies from the last 24hrs per EMR and Reviewed    Objective:  Temp:  [35.7 ??C-36.5 ??C] 35.7 ??C  Heart Rate:  [60-72] 60  Resp:  [19-20] 19  BP: (123-154)/(58-68) 141/63  SpO2:  [92 %-95 %] 95 %    GEN: A&Ox3, NAD, lying in hospital bed  HEENT: MMM, sclerae anicteric  CV: RRR s1 s2 no m/g/r, no peripheral edema  LUNGS: CTA bilat, prolonged exp phase  ABD: soft, NT, ND, no masses  NEURO: alert and interactive, face symmetric, MAEE

## 2017-12-19 LAB — BASIC METABOLIC PANEL
BLOOD UREA NITROGEN: 12 mg/dL (ref 7–21)
BUN / CREAT RATIO: 15
CALCIUM: 8.8 mg/dL (ref 8.5–10.2)
CHLORIDE: 96 mmol/L — ABNORMAL LOW (ref 98–107)
CO2: 27 mmol/L (ref 22.0–30.0)
EGFR MDRD AF AMER: >=60 mL/min/{1.73_m2} (ref >=60)
EGFR MDRD NON AF AMER: >=60 mL/min/{1.73_m2} (ref >=60)
GLUCOSE RANDOM: 131 mg/dL (ref 65–179)
POTASSIUM: 3.8 mmol/L (ref 3.5–5.0)
SODIUM: 132 mmol/L — ABNORMAL LOW (ref 135–145)

## 2017-12-19 LAB — CBC
HEMOGLOBIN: 10.1 g/dL — ABNORMAL LOW (ref 13.5–17.5)
MEAN CORPUSCULAR HEMOGLOBIN CONC: 33.1 g/dL (ref 31.0–37.0)
MEAN CORPUSCULAR HEMOGLOBIN: 34.4 pg — ABNORMAL HIGH (ref 26.0–34.0)
MEAN CORPUSCULAR VOLUME: 104.2 fL — ABNORMAL HIGH (ref 80.0–100.0)
MEAN PLATELET VOLUME: 7.8 fL (ref 7.0–10.0)
PLATELET COUNT: 357 10*9/L (ref 150–440)
RED BLOOD CELL COUNT: 2.92 10*12/L — ABNORMAL LOW (ref 4.50–5.90)
RED CELL DISTRIBUTION WIDTH: 14.5 % (ref 12.0–15.0)
WBC ADJUSTED: 14.2 10*9/L — ABNORMAL HIGH (ref 4.5–11.0)

## 2017-12-19 LAB — CHLORIDE: Chloride:SCnc:Pt:Ser/Plas:Qn:: 96 — ABNORMAL LOW

## 2017-12-19 LAB — MAGNESIUM: Magnesium:MCnc:Pt:Ser/Plas:Qn:: 1.7

## 2017-12-19 LAB — HEMOGLOBIN: Lab: 10.1 — ABNORMAL LOW

## 2017-12-19 MED ORDER — PANTOPRAZOLE 40 MG TABLET,DELAYED RELEASE: tablet | 0 refills | 0 days

## 2017-12-19 MED ORDER — PANTOPRAZOLE 40 MG TABLET,DELAYED RELEASE
ORAL_TABLET | Freq: Two times a day (BID) | ORAL | 0 refills | 0.00000 days | Status: CP
Start: 2017-12-19 — End: 2018-01-16

## 2017-12-19 MED FILL — PANTOPRAZOLE/40MG/TBEC: PANTOPRAZOLE/40MG/TBEC | 30 days supply | Qty: 60 | Fill #0

## 2017-12-19 NOTE — Unmapped (Signed)
Daily Progress Note    Assessment/Plan:    Principal Problem:    Duodenitis  Active Problems:    Essential hypertension, benign    Chronic back pain    CAD (coronary artery disease), autologous vein bypass graft    Tobacco abuse    Giant cell arteritis (CMS-HCC)    Melena    Constipation  Resolved Problems:    * No resolved hospital problems. *        Pressure Ulcer(s)    Active Pressure Ulcer     None                 Erik [name] is a 83 y.o. 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    UGIB: Epigastric pain with new melena, drop in hemoglobin from 14.5-->10.2-->8.6. EGD See rheum below, but will continue prednisone. GI w EGD on 12/26, identifed significant duodenal ulceration. No further melena. GI would like ppi drip for 72 hours.   - Trend Hb daily, maintain T&S, 2 large bore IVs  - Protonix gtt, then protonix BID  - Hold ASA  - GI consult, appec recs  - regular diet  - check H.pylori  ????   Giant Cell Arteritis: Diagnosed with temporal biopsy after left visual loss., started on Prednisone 60 mg daily in early November 2018, now tapered to 50 mg daily for 1 month (12/7-1/7) then 40 mg daily until follow up. Planning to start Tocilizumab once approved.  Given visual loss, rheumatology feels strongly about continuing prednisone.  - Continue Prednisone 50 mg daily  - Calcium and vitamin D supplementation    Cough: Multiple sick contacts, denies fever but endorses cough productive of yellow sputum. Leukocytosis in the setting of prednisone use. Denies a chronic cough. Carries a dx of COPD, but no formal PFTs documented and not on an inhalers. CXR w/o consolidation, no inc oxygen req.  - Duonebs PRN    HTN: Hypertensive in ED, on an odd home regimen of hydralazine 25 mg once daily, lisinopril 40 mg daily, losartan 100 mg daily, atenolol 50 mg BID per records, he does not remember all his medications.  - Continue atenolol 50 mg BID, can consider changing to coreg for better BP control  - Continue losartan 100 mg daily  - Hydralazine 25 mg once daily, pending BP consider increasing to more normal dose of TID or at least BID  - Hold lisinopril while on losartan   ??  Chronic medical problems:  CAD s/p 3v CABG: Hold ASA, Continue statin, BB  Alcohol use: Drinks up to 4 beers a day, CIWA  Tobacco use: Active smoker, declined nicotine patch.   Constipation: Miralax daily  Chronic back pain: Continue home oxycodone-acetaminophen 5-325 mg q6h prn, verified in PDMP Report  ___________________________________________________________________    Subjective:  No acute events overnight. Remains frustrated w hospital course.    Recent Results (from the past 24 hour(s))   Magnesium Level    Collection Time: 12/18/17  7:53 AM   Result Value Ref Range    Magnesium 1.7 1.6 - 2.2 mg/dL   Basic metabolic panel    Collection Time: 12/18/17  7:53 AM   Result Value Ref Range    Sodium 132 (L) 135 - 145  mmol/L    Potassium 3.3 (L) 3.5 - 5.0 mmol/L    Chloride 99 98 - 107 mmol/L    CO2 26.0 22.0 - 30.0 mmol/L    BUN 12 7 - 21 mg/dL    Creatinine 0.98 1.19 - 1.30 mg/dL    BUN/Creatinine Ratio 16     EGFR MDRD Non Af Amer >=60 >=60 mL/min/1.52m2    EGFR MDRD Af Amer >=60 >=60 mL/min/1.6m2    Anion Gap 7 (L) 9 - 15 mmol/L    Glucose 158 65 - 179 mg/dL    Calcium 8.5 8.5 - 14.7 mg/dL   CBC    Collection Time: 12/18/17  7:53 AM   Result Value Ref Range    WBC 10.5 4.5 - 11.0 10*9/L    RBC 2.61 (L) 4.50 - 5.90 10*12/L    HGB 9.2 (L) 13.5 - 17.5 g/dL    HCT 82.9 (L) 56.2 - 53.0 %    MCV 106.1 (H) 80.0 - 100.0 fL    MCH 35.2 (H) 26.0 - 34.0 pg    MCHC 33.1 31.0 - 37.0 g/dL    RDW 13.0 86.5 - 78.4 %    MPV 7.8 7.0 - 10.0 fL    Platelet 317 150 - 440 10*9/L     Labs/Studies:  Labs and Studies from the last 24hrs per EMR and Reviewed    Objective:  Temp:  [35.7 ??C-36.5 ??C] 35.9 ??C  Heart Rate:  [60-72] 60  Resp:  [18-20] 18  BP: (123-154)/(58-68) 139/63  SpO2:  [92 %-95 %] 92 %    GEN: A&Ox3, NAD, lying in hospital bed  HEENT: MMM, sclerae anicteric  CV: RRR s1 s2 no m/g/r, no peripheral edema  LUNGS: CTA bilat, prolonged exp phase  ABD: soft, NT, ND, no masses  NEURO: alert and interactive, face symmetric, MAEE

## 2017-12-19 NOTE — Unmapped (Signed)
Problem: Patient Care Overview  Goal: Plan of Care Review  Outcome: Progressing  Pt alert and oriented x4. VSS with no c/o distress noted during shift. Pt continues on protonix gtt. Pt denies any bleeding and remains free of falls. Will continue to monitor.  Goal: Individualization and Mutuality  Outcome: Progressing      Problem: Fall Risk (Adult)  Goal: Identify Related Risk Factors and Signs and Symptoms  Related risk factors and signs and symptoms are identified upon initiation of Human Response Clinical Practice Guideline (CPG).   Outcome: Progressing   12/19/17 0419   Fall Risk (Adult)   Related Risk Factors (Fall Risk) age-related changes   Signs and Symptoms (Fall Risk) presence of risk factors       Problem: Gastrointestinal Bleeding (Adult)  Goal: Signs and Symptoms of Listed Potential Problems Will be Absent, Minimized or Managed (Gastrointestinal Bleeding)  Signs and symptoms of listed potential problems will be absent, minimized or managed by discharge/transition of care (reference Gastrointestinal Bleeding (Adult) CPG).   Outcome: Progressing   12/19/17 0419   Gastrointestinal Bleeding (Adult)   Problems Assessed (GI Bleeding) all   Problems Present (GI Bleeding) none       Problem: VTE, DVT and PE (Adult)  Goal: Signs and Symptoms of Listed Potential Problems Will be Absent, Minimized or Managed (VTE, DVT and PE)  Signs and symptoms of listed potential problems will be absent, minimized or managed by discharge/transition of care (reference VTE, DVT and PE (Adult) CPG).   Outcome: Progressing   12/19/17 0419   VTE, DVT and PE (Adult)   Problems Assessed (VTE, DVT, PE) all   Problems Present (VTE, DVT, PE) none

## 2017-12-19 NOTE — Unmapped (Signed)
Problem: Patient Care Overview  Goal: Plan of Care Review  Pt is A&O x4, on RA. Pt continued on Protonix gtt. Hbg stable at 9.2 this AM. Continued clear liquid diet. CIWA scores of 0 this shift. Some agitation noted this AM, pt states he cannot get any rest with staff coming in and out of his room all day. Also frustrated with having a roommate. Talked with charge nurse about getting a private room. Pt to d/c Saturday. VSS at this time. Will continue to monitor.   12/18/17 1643   OTHER   Plan of Care Reviewed With patient;daughter   Plan of Care Review   Progress improving       Problem: Self-Care Deficit (Adult,Obstetrics,Pediatric)  Goal: Improved Ability to Perform BADL and IADL  Patient will demonstrate the desired outcomes by discharge/transition of care.    12/18/17 1643   Self-Care Deficit (Adult,Obstetrics,Pediatric)   Improved Ability to Perform BADL and IADL making progress toward outcome       Problem: Fall Risk (Adult)  Intervention: Monitor/Assist with Self Care   12/18/17 1643   Interventions   Self-Care Promotion independence encouraged       Goal: Identify Related Risk Factors and Signs and Symptoms  Related risk factors and signs and symptoms are identified upon initiation of Human Response Clinical Practice Guideline (CPG).    12/18/17 1643   Fall Risk (Adult)   Signs and Symptoms (Fall Risk) presence of risk factors     Goal: Absence of Fall  Patient will demonstrate the desired outcomes by discharge/transition of care.    12/18/17 1643   Fall Risk (Adult)   Absence of Fall making progress toward outcome       Problem: Gastrointestinal Bleeding (Adult)  Goal: Signs and Symptoms of Listed Potential Problems Will be Absent, Minimized or Managed (Gastrointestinal Bleeding)  Signs and symptoms of listed potential problems will be absent, minimized or managed by discharge/transition of care (reference Gastrointestinal Bleeding (Adult) CPG).    12/18/17 1643   Gastrointestinal Bleeding (Adult) Problems Assessed (GI Bleeding) all   Problems Present (GI Bleeding) none       Problem: VTE, DVT and PE (Adult)  Goal: Signs and Symptoms of Listed Potential Problems Will be Absent, Minimized or Managed (VTE, DVT and PE)  Signs and symptoms of listed potential problems will be absent, minimized or managed by discharge/transition of care (reference VTE, DVT and PE (Adult) CPG).    12/18/17 1643   VTE, DVT and PE (Adult)   Problems Assessed (VTE, DVT, PE) all   Problems Present (VTE, DVT, PE) none

## 2017-12-20 NOTE — Unmapped (Signed)
Physician Discharge Summary    Identifying Information:   Erik [name]  [birth year]  [id number]    Admit date: 12/15/2017    Discharge date: 12/19/2017     Discharge Service: [facility] (MDW)    Discharge Attending Physician: Hannah Beat, MD  Discharge to: Home    Discharge Diagnoses:  Principal Problem:    Duodenitis  Active Problems:    Essential hypertension, benign    Chronic back pain    CAD (coronary artery disease), autologous vein bypass graft    Tobacco abuse    Giant cell arteritis (CMS-HCC)    Melena    Constipation  Resolved Problems:    * No resolved hospital problems. *      Outpatient Provider Follow Up Issues:   Recheck CBC within 7 days with PCP   Follow up H.Pylori testing. If positive, confirmation of eradication following treatment is recommended.    Hospital Course:   Erik [name] is an 83 year-old male with a PMHx significant for CAD (s/p CABG in 2001 on ASA), COPD, hypertension, EtOH use, chronic back pain (with regular ibuprofen use) and recently diagnosed giant cell arteritis (for which he was started on high-dose prednisone, but failed to initiate recommended PPI) who presented for evaluation following two episodes of melena. His presentation was notable for hemodynamic stability but a hemoglobin of 10.2 from a recent (11/28/2017) baseline of 14.5. His platelets and INR were normal. He has been treated with twice daily IV Protonix, and has remained hemodynamically stable and well-appearing albeit with mild epigastric comfort that improves postprandially. He has not had nausea or vomiting/hematemesis. Repeat hemoglobin, however, was 8.9. Discussion with GI consultants raised considerable concern for the presence of a duodenal ulcer that would be high risk for spontaneous (re)bleed, so a recommendation was made for patient transfer to the main hospital for improved access to endoscopy and/or VIR in the event of clinical decompensation. High-dose (50 mg daily) prednisone was continued at the recommendation of rheumatology due to the high risk for, and potentially high morbidity of, a flare of his vasculitis steroids were tapered more quickly.    After being transferred to [facility] main, the patient underwent EGD which found large non-bleeding duodenal ulcer. The patient was then put on a PPI IV drip for 48 hours and transitioned to oral PPI on day of discharge. Hemoglobin stable on day of discharge with no further bleeding. H. Pylori stool antigen pending on discharge. Patient was instructed to follow up with primary care provider within 7 days for CBC recheck. Instructed to stop NSAIDS. ASA resumed on discharge     Procedures:  EGD  No admission procedures for hospital encounter.  ______________________________________________________________________    Discharge Day Services:  BP 118/55  - Pulse 64  - Temp 36.2 ??C (Oral)  - Resp 18  - Ht 152.4 cm (5')  - Wt 57.2 kg (126 lb)  - SpO2 96%  - BMI 24.61 kg/m??   Pt seen on the day of discharge and determined appropriate for discharge.    Condition at Discharge: stable  ______________________________________________________________________  Discharge Medications:     Your Medication List      STOP taking these medications    ibuprofen 200 MG tablet  Commonly known as:  ADVIL,MOTRIN     lisinopril 40 MG tablet  Commonly known as:  PRINIVIL,ZESTRIL        START taking these medications    pantoprazole 40 MG tablet  Commonly known as:  PROTONIX  Take 1 tablet (40 mg total) by mouth Two (2) times a day (30 minutes before a meal).        CONTINUE taking these medications    amLODIPine 10 MG tablet  Commonly known as:  NORVASC  TAKE 1 TABLET BY MOUTH ONCE DAILY     ascorbic acid (vitamin C) 500 MG tablet  Commonly known as:  VITAMIN C  Take 500 mg by mouth daily.     aspirin 81 MG tablet  Commonly known as:  ECOTRIN  Take 81 mg by mouth daily.     atenolol 50 MG tablet  Commonly known as:  TENORMIN  Take 1 tablet (50 mg total) by mouth Two (2) times a day.     hydrALAZINE 25 MG tablet  Commonly known as:  APRESOLINE  Take 1 tablet (25 mg total) by mouth daily.     losartan 100 MG tablet  Commonly known as:  COZAAR  Take 1 tablet (100 mg total) by mouth daily.     multivitamin per tablet  Commonly known as:  TAB-A-VITE/THERAGRAN  Take 1 tablet by mouth daily.     oxyCODONE-acetaminophen 5-325 mg per tablet  Commonly known as:  PERCOCET  Take 1 tablet by mouth every six (6) hours as needed for pain.     predniSONE 20 MG tablet  Commonly known as:  DELTASONE  Take 50 mg by mouth daily.     simvastatin 10 MG tablet  Commonly known as:  ZOCOR  Take 1 tablet (10 mg total) by mouth daily.     vitamin B-12 1000 MCG tablet  Generic drug:  cyanocobalamin  Take 1,000 mcg by mouth daily.          ______________________________________________________________________  Pending Test Results (if blank, then none):      Most Recent Labs:  Microbiology Results (last day)     ** No results found for the last 24 hours. **          Lab Results   Component Value Date    WBC 14.2 (H) 12/19/2017    HGB 10.1 (L) 12/19/2017    HCT 30.4 (L) 12/19/2017    PLT 357 12/19/2017       Lab Results   Component Value Date    NA 132 (L) 12/19/2017    K 3.8 12/19/2017    CL 96 (L) 12/19/2017    CO2 27.0 12/19/2017    BUN 12 12/19/2017    CREATININE 0.82 12/19/2017    CALCIUM 8.8 12/19/2017    MG 1.7 12/19/2017       Lab Results   Component Value Date    ALKPHOS 81 12/15/2017    BILITOT 0.3 12/15/2017    PROT 6.0 (L) 12/15/2017    ALBUMIN 3.5 12/15/2017    ALT 31 12/15/2017    AST 26 12/15/2017       Lab Results   Component Value Date    PT 9.8 (L) 12/15/2017    INR 0.86 12/15/2017    APTT 27.1 (L) 12/15/2017     Hospital Radiology:  Xr Chest Portable    Result Date: 12/15/2017  EXAM: XR CHEST PORTABLE DATE: 12/15/2017 9:18 AM ACCESSION: [id number] UN DICTATED: 12/15/2017 10:43 AM INTERPRETATION LOCATION: [facility] CLINICAL INDICATION: 83 years old Male with COUGH--  COMPARISON: CT abdomen pelvis 12/15/2017. Chest radiographs 09/01/2002. TECHNIQUE: Portable Chest Radiograph. CONCLUSIONS: The patient is rotated to the right, limiting evaluation. No focal lung consolidation or pulmonary edema. No pleural effusions or pneumothorax. Cardiomediastinal  silhouette within normal limits poststernotomy and CABG.    Ct Abdomen Pelvis W Iv Contrast Only    Result Date: 12/15/2017  EXAM: CT abdomen and pelvis with contrast DATE: 12/15/2017 3:08 AM ACCESSION: [id number] UN DICTATED: 12/15/2017 3:23 AM INTERPRETATION LOCATION: [facility] CLINICAL INDICATION: 83 years old Male with ABDOMINAL PAIN, (specify site in comments)-midepigastrium-  COMPARISON: None TECHNIQUE: A spiral CT scan was obtained with IV contrast from the lung bases to the pubic symphysis.  Images were reconstructed in the axial plane. Coronal and sagittal reformatted images were also provided for further evaluation. FINDINGS: LOWER CHEST: Unremarkable. ABDOMEN/PELVIS: HEPATOBILIARY: Unremarkable liver. No biliary ductal dilatation. Gallbladder is unremarkable. PANCREAS: Unremarkable. SPLEEN: Unremarkable. ADRENAL GLANDS: Unremarkable. KIDNEYS/URETERS: Bilateral renal cysts and other subcentimeter hypodensities  which are too small to characterize by CT. BLADDER: Unremarkable. BOWEL/PERITONEUM/RETROPERITONEUM: No bowel obstruction. Bowel wall thickening and inflammatory stranding of the second portion of the duodenum. No intraperitoneal free air. No ascites. VASCULATURE: Abdominal aorta within normal limits for patient's age. Extensive atherosclerotic calcification of the abdominal aorta and its branch vessels. Unremarkable inferior vena cava. LYMPH NODES: No adenopathy. REPRODUCTIVE ORGANS: Unremarkable prostate. BONES/SOFT TISSUES: Multilevel degenerative disease of the spine. Diffuse osteopenia.      -- Duodenal wall thickening and fat stranding concerning for duodenitis.        ______________________________________________________________________ Discharge Instructions:               Follow Up instructions and Outpatient Referrals     Discharge instructions       You presented with gastrointestinal bleeding and were found to have an ulcer in your stomach. We will need you to continue taking Pantoprazole 40 mg twice a day until follow up. Please follow up with your primary care provider within one week so they can recheck your blood counts. If any further bleeding please return to care. DO NOT USE Ibuprofen or NSAID medications in the future to prevent re bleeding.               Appointments which have been scheduled for you    Jan 02, 2018  8:00 AM EST  (Arrive by 7:45 AM)  OFFICE VISIT with Girtha Hake, MD  [facility] PRIMARY CARE [facility]) [address]  [place] [address]  [phone]   Jan 22, 2018 10:45 AM EST  RETURN  NEURO-OPHTHAL with Jomarie Longs, MD  [facility] OPHTHALMOLOGY [address] [facility]) [address]  [address]  [place] [address]  [phone]   Feb 04, 2018  2:30 PM EST  (Arrive by 2:15 PM)  RETURN  RHEUMATOLOGY with Brooke Bonito, MD  [facility] RHEUMATOLOGY [address] [facility]) [address]  [place] [address]  [phone]          Length of Discharge: I spent greater than 30 mins in the discharge of this patient.

## 2017-12-30 MED ORDER — AMLODIPINE 10 MG TABLET
ORAL_TABLET | 2 refills | 0 days | Status: CP
Start: 2017-12-30 — End: 2018-03-31

## 2018-01-02 ENCOUNTER — Encounter: Admit: 2018-01-02 | Discharge: 2018-01-03 | Payer: MEDICARE

## 2018-01-02 DIAGNOSIS — J449 Chronic obstructive pulmonary disease, unspecified: Principal | ICD-10-CM

## 2018-01-02 DIAGNOSIS — G8929 Other chronic pain: Secondary | ICD-10-CM

## 2018-01-02 DIAGNOSIS — M316 Other giant cell arteritis: Secondary | ICD-10-CM

## 2018-01-02 DIAGNOSIS — M5440 Lumbago with sciatica, unspecified side: Secondary | ICD-10-CM

## 2018-01-02 DIAGNOSIS — D62 Acute posthemorrhagic anemia: Secondary | ICD-10-CM

## 2018-01-02 DIAGNOSIS — K269 Duodenal ulcer, unspecified as acute or chronic, without hemorrhage or perforation: Secondary | ICD-10-CM

## 2018-01-02 LAB — CBC
HEMATOCRIT: 32.6 % — ABNORMAL LOW (ref 41.0–53.0)
HEMOGLOBIN: 10.5 g/dL — ABNORMAL LOW (ref 13.5–17.5)
MEAN CORPUSCULAR HEMOGLOBIN CONC: 32.1 g/dL (ref 31.0–37.0)
MEAN CORPUSCULAR VOLUME: 107.4 fL — ABNORMAL HIGH (ref 80.0–100.0)
MEAN PLATELET VOLUME: 8.4 fL (ref 7.0–10.0)
PLATELET COUNT: 412 10*9/L (ref 150–440)
RED BLOOD CELL COUNT: 3.03 10*12/L — ABNORMAL LOW (ref 4.50–5.90)
RED CELL DISTRIBUTION WIDTH: 14.6 % (ref 12.0–15.0)
WBC ADJUSTED: 16.6 10*9/L — ABNORMAL HIGH (ref 4.5–11.0)

## 2018-01-02 LAB — WBC ADJUSTED: Lab: 16.6 — ABNORMAL HIGH

## 2018-01-02 MED ORDER — OXYCODONE-ACETAMINOPHEN 5 MG-325 MG TABLET
ORAL_TABLET | ORAL | 0 refills | 0 days | Status: CP | PRN
Start: 2018-01-02 — End: 2018-02-06

## 2018-01-02 NOTE — Unmapped (Signed)
Name:  Erik [name]  DOB: Jun 02, 1934  Today's Date: 01/02/2018  Age:  84 y.o.    Assessment and Plan:     Kasir was seen today for hypertension.    Diagnoses and all orders for this visit:    Duodenal ulcer  -     H. pylori antigen, stool  -     CBC    Chronic obstructive pulmonary disease, unspecified COPD type (CMS-HCC)    Giant cell arteritis (CMS-HCC)    Anemia due to acute blood loss  -     CBC    Chronic bilateral low back pain with sciatica, sciatica laterality unspecified  -     oxyCODONE-acetaminophen (PERCOCET) 5-325 mg per tablet; Take 1 tablet by mouth every four (4) hours as needed for pain.             HPI:      Erik [name]  is here for   Chief Complaint   Patient presents with   ??? Hypertension     f/u       He was recently hospitalized with a bleeding duodenal ulcer. He was found to be very anemic, and was having melena and constipation. Endoscopy was performed. The endoscopy report indicates that specimens were obtained, but no pathology, cytology, or microbiological results are found in  [facility]. He was apparently not started on therapy for H. Pylori. Stool for H. Pylori antigen was ordered while in hospital, but not performed. Hospital records reviewed in detail.  Since discharge, he has had no further melena. He is taking PANTOPRAZOLE 40 mg. Daily. No abdominal pain, nausea or vomiting.    His GCA is being followed/treated by ophthalmology and rheumatology. He has no significant vision in his left eye. He is taking PREDNISONE daily.    He denies any current dyspnea or other COPD symptoms. He is not using an inhaler at this time, and declines prescription for one.    His back pain is not well controlled.       ROS:      Review of Systems    REVIEW OF SYSTEMS:    Constitutional: No significant weight loss. No significant weight gain. No fever, chills.  HEAD/FACE: No complaints.  EYES: .Legally blind left eye (GCA).  ENTM: No complaints  Respiratory: No shortness of breath. No prolonged cough.  Cardiovascular: No exertional chest pain. No palpitations.   Gastrointestinal: See HPI. No abdominal pain. No constipation. No diarrhea. No bright red blood in bowel movements.   Skin: No complaints.  Musculoskeletal: No complaints not listed in HPI.  Genitourinary: No urinary frequency. No dysuria. No flank pain.  Hematologic/Lymphatic/Immunologic: No complaints.         Vital Signs:     Wt Readings from Last 3 Encounters:   01/02/18 56.7 kg (125 lb)   12/17/17 57.2 kg (126 lb)   11/28/17 58.1 kg (128 lb)     Temp Readings from Last 3 Encounters:   12/19/17 36.2 ??C (97.2 ??F) (Oral)   11/28/17 36.7 ??C (98 ??F) (Oral)   10/23/17 36.6 ??C (97.9 ??F) (Temporal)     BP Readings from Last 3 Encounters:   01/02/18 144/62   12/19/17 118/55   11/28/17 175/74     Pulse Readings from Last 3 Encounters:   01/02/18 66   12/19/17 64   11/28/17 69     Estimated body mass index is 24.41 kg/m?? as calculated from the following:    Height as of this encounter:  152.4 cm (5').    Weight as of this encounter: 56.7 kg (125 lb).  Facility age limit for growth percentiles is 20 years.        Objective:      Physical Exam    PHYSICAL EXAM:     CONSTITUTIONAL: Alert, oriented, in no apparent distress.  CARDIOVASCULAR: Heart: Regular rhythm. No S3.   LUNGS: Clear to auscultation.  GASTROINTESTINAL: Abdomen soft. Non-distended. Non-tender. No masses or organomegaly.   MUSCULOSKELETAL: No clubbing. Trace to 1+ left pretibial pitting edema. No calf tenderness, erythema, increased heat.  SKIN:  No rash.   NEURO: Alert, oriented. Speech normal.   PSYCH: Affect normal. Normal eye contact.  HEME/LYMPH/IMMUNE:  No abnormalities noted.        25 minutes were spent with patient. At least 50% of time was spent in counseling and coordination of care.    Barriers to goals identified and addressed. Pertinent handouts were given today and reviewed with the patient as indicated.  The Care Plan and Self-Management goals have been included on the AVS and the AVS has been printed.  Any outside resources or referrals needed at this time are noted above. Patient's current medications have been reviewed.  Patient voiced understanding and all questions have been answered to satisfaction.   Medical records including recent encounters, pertinent imaging, and pertinent labs reviewed.      Past Medical/Surgical History:     Past Medical History:   Diagnosis Date   ??? Cataract 2005    cataract surgery   ??? COPD (chronic obstructive pulmonary disease) (CMS-HCC) 10/07/2016   ??? DJD (degenerative joint disease), lumbar    ??? Hypertension    ??? Tobacco abuse 02/15/2014     Past Surgical History:   Procedure Laterality Date   ??? CARDIAC VALVE REPLACEMENT      triple bypass   ??? CATARACT EXTRACTION     ??? CORONARY ARTERY BYPASS GRAFT  2001    x3   ??? EYE SURGERY  2005    cataracts   ??? PR REPAIR ING HERNIA,5+Y/O,REDUCIBL Left 06/19/2015    Procedure: REPAIR INITIAL INGUINAL HERNIA, AGE 4 YEARS OR OLDER; REDUCIBLE;  Surgeon: Shelda Jakes, MD;  Location[facility];  Service: Trauma   ??? PR TEMPORAL ARTERY LIGATN OR BX Left 10/23/2017    Procedure: LIGATION OR BIOPSY, TEMPORAL ARTERY;  Surgeon: Vivien Rossetti, MD;  [facility];  Service: ENT   ??? PR UPPER GI ENDOSCOPY,DIAGNOSIS N/A 12/17/2017    Procedure: UGI ENDO, INCLUDE ESOPHAGUS, STOMACH, & DUODENUM &/OR JEJUNUM; DX W/WO COLLECTION SPECIMN, BY BRUSH OR WASH;  Surgeon: Bluford Kaufmann, MD;  [facility];  Service: Gastroenterology         Family History:     Family History   Problem Relation Age of Onset   ??? Thyroid disease Sister    ??? Cancer Brother    ??? Glaucoma Neg Hx    ??? Macular degeneration Neg Hx    ??? Retinal detachment Neg Hx        Social History:     Social History     Social History   ??? Marital status: Widowed     Spouse name: N/A   ??? Number of children: N/A   ??? Years of education: N/A     Social History Main Topics   ??? Smoking status: Current Every Day Smoker     Packs/day: 0.50 Years: 4.00   ??? Smokeless tobacco:  Never Used      Comment: quit for 25 years, restarted 2014   ??? Alcohol use 2.4 oz/week     4 Cans of beer per week      Comment: daily   ??? Drug use: No   ??? Sexual activity: Not Asked     Other Topics Concern   ??? Do You Use Sunscreen? No   ??? Tanning Bed Use? No   ??? Are You Easily Burned? No   ??? Excessive Sun Exposure? No   ??? Blistering Sunburns? Yes     Past     Social History Narrative    Lives in [place], wife passed 7 years ago. His daughter lives with him. Granddaughter, Victorino Dike, manages his finances.          Allergies:     Patient has no known allergies.    Current Medications:     Current Outpatient Prescriptions   Medication Sig Dispense Refill   ??? amLODIPine (NORVASC) 10 MG tablet TAKE 1 TABLET BY MOUTH ONCE DAILY 30 tablet 2   ??? ascorbic acid (VITAMIN C) 500 MG tablet Take 500 mg by mouth daily.     ??? aspirin (ECOTRIN) 81 MG tablet Take 81 mg by mouth daily.     ??? atenolol (TENORMIN) 50 MG tablet Take 1 tablet (50 mg total) by mouth Two (2) times a day. 60 tablet 11   ??? cyanocobalamin (VITAMIN B-12) 1000 MCG tablet Take 1,000 mcg by mouth daily.     ??? hydrALAZINE (APRESOLINE) 25 MG tablet Take 1 tablet (25 mg total) by mouth daily. 30 tablet 11   ??? losartan (COZAAR) 100 MG tablet Take 1 tablet (100 mg total) by mouth daily. 30 tablet 11   ??? multivitamin (TAB-A-VITE/THERAGRAN) per tablet Take 1 tablet by mouth daily.     ??? pantoprazole (PROTONIX) 40 MG tablet Take 1 tablet (40 mg total) by mouth Two (2) times a day (30 minutes before a meal). 60 tablet 0   ??? predniSONE (DELTASONE) 20 MG tablet Take 50 mg by mouth daily.     ??? simvastatin (ZOCOR) 10 MG tablet Take 1 tablet (10 mg total) by mouth daily. 90 tablet 1   ??? oxyCODONE-acetaminophen (PERCOCET) 5-325 mg per tablet Take 1 tablet by mouth every four (4) hours as needed for pain. 120 tablet 0     No current facility-administered medications for this visit.          POCT:     Results for orders placed or performed during the hospital encounter of 12/15/17   Comprehensive metabolic panel   Result Value Ref Range    Sodium 133 (L) 135 - 145 mmol/L    Potassium 4.6 3.5 - 5.0 mmol/L    Chloride 99 98 - 107 mmol/L    CO2 28.0 22.0 - 30.0 mmol/L    BUN 27 (H) 7 - 21 mg/dL    Creatinine 4.69 6.29 - 1.30 mg/dL    BUN/Creatinine Ratio 28     EGFR MDRD Non Af Amer >=60 >=60 mL/min/1.86m2    EGFR MDRD Af Amer >=60 >=60 mL/min/1.83m2    Anion Gap 6 (L) 9 - 15 mmol/L    Glucose 124 65 - 179 mg/dL    Calcium 9.3 8.5 - 52.8 mg/dL    Albumin 3.5 3.5 - 5.0 g/dL    Total Protein 6.0 (L) 6.5 - 8.3 g/dL    Total Bilirubin 0.3 0.0 - 1.2 mg/dL    AST 26 19 -  55 U/L    ALT 31 19 - 72 U/L    Alkaline Phosphatase 81 38 - 126 U/L   Lipase   Result Value Ref Range    Lipase 38 (L) 44 - 232 U/L   Urinalysis with Culture Reflex   Result Value Ref Range    Color, UA Yellow     Clarity, UA Clear     Specific Gravity, UA 1.010 1.005 - 1.040    pH, UA 7.0 5.0 - 9.0    Leukocyte Esterase, UA Negative Negative    Nitrite, UA Negative Negative    Protein, UA Negative Negative    Glucose, UA Negative Negative    Ketones, UA Negative Negative    Urobilinogen, UA 0.2 mg/dL 0.2 - 2.0 mg/dL    Bilirubin, UA Negative Negative    Blood, UA Negative Negative    RBC, UA <1 <3 /HPF    WBC, UA <1 <2 /HPF    Squam Epithel, UA <1 0 - 5 /HPF    Bacteria, UA None Seen None Seen /HPF   Protime-INR   Result Value Ref Range    PT 9.8 (L) 10.2 - 12.8 sec    INR 0.86    APTT   Result Value Ref Range    APTT 27.1 (L) 27.7 - 37.7 sec    Heparin Correlation <0.2    Troponin I   Result Value Ref Range    Troponin I <0.060 <0.060 ng/mL   Basic metabolic panel   Result Value Ref Range    Sodium 133 (L) 135 - 145 mmol/L    Potassium 3.9 3.5 - 5.0 mmol/L    Chloride 100 98 - 107 mmol/L    CO2 28.0 22.0 - 30.0 mmol/L    BUN 22 (H) 7 - 21 mg/dL    Creatinine 1.61 0.96 - 1.30 mg/dL    BUN/Creatinine Ratio 26     EGFR MDRD Non Af Amer >=60 >=60 mL/min/1.61m2    EGFR MDRD Af Amer >=60 >=60 mL/min/1.72m2    Anion Gap 5 (L) 9 - 15 mmol/L    Glucose 129 65 - 179 mg/dL    Calcium 8.8 8.5 - 04.5 mg/dL   Hemoglobin   Result Value Ref Range    HGB 8.9 (L) 13.5 - 17.5 g/dL   Sedimentation rate, manual   Result Value Ref Range    Sed Rate 45 (H) 0 - 20 mm/h   C-reactive protein   Result Value Ref Range    CRP 9.9 <10.0 mg/L   Hemoglobin   Result Value Ref Range    HGB 8.6 (L) 13.5 - 17.5 g/dL   Magnesium Level   Result Value Ref Range    Magnesium 2.0 1.6 - 2.2 mg/dL   Basic metabolic panel   Result Value Ref Range    Sodium 130 (L) 135 - 145 mmol/L    Potassium 4.1 3.5 - 5.0 mmol/L    Chloride 96 (L) 98 - 107 mmol/L    CO2 28.0 22.0 - 30.0 mmol/L    BUN 15 7 - 21 mg/dL    Creatinine 4.09 (L) 0.70 - 1.30 mg/dL    BUN/Creatinine Ratio 23     EGFR MDRD Non Af Amer >=60 >=60 mL/min/1.32m2    EGFR MDRD Af Amer >=60 >=60 mL/min/1.76m2    Anion Gap 6 (L) 9 - 15 mmol/L    Glucose 91 65 - 179 mg/dL    Calcium 8.7 8.5 - 81.1 mg/dL   CBC  Result Value Ref Range    WBC 9.6 4.5 - 11.0 10*9/L    RBC 2.58 (L) 4.50 - 5.90 10*12/L    HGB 9.0 (L) 13.5 - 17.5 g/dL    HCT 16.1 (L) 09.6 - 53.0 %    MCV 105.3 (H) 80.0 - 100.0 fL    MCH 35.0 (H) 26.0 - 34.0 pg    MCHC 33.2 31.0 - 37.0 g/dL    RDW 04.5 40.9 - 81.1 %    MPV 7.8 7.0 - 10.0 fL    Platelet 268 150 - 440 10*9/L   Hemoglobin and Hematocrit   Result Value Ref Range    HGB 8.6 (L) 13.5 - 17.5 g/dL    HCT 91.4 (L) 78.2 - 53.0 %   Magnesium Level   Result Value Ref Range    Magnesium 1.8 1.6 - 2.2 mg/dL   Basic metabolic panel   Result Value Ref Range    Sodium 131 (L) 135 - 145 mmol/L    Potassium 3.7 3.5 - 5.0 mmol/L    Chloride 97 (L) 98 - 107 mmol/L    CO2 30.0 22.0 - 30.0 mmol/L    BUN 13 7 - 21 mg/dL    Creatinine 9.56 2.13 - 1.30 mg/dL    BUN/Creatinine Ratio 17     EGFR MDRD Non Af Amer >=60 >=60 mL/min/1.46m2    EGFR MDRD Af Amer >=60 >=60 mL/min/1.49m2    Anion Gap 4 (L) 9 - 15 mmol/L    Glucose 89 65 - 179 mg/dL    Calcium 8.8 8.5 - 08.6 mg/dL   CBC   Result Value Ref Range    WBC 13.0 (H) 4.5 - 11.0 10*9/L    RBC 2.80 (L) 4.50 - 5.90 10*12/L    HGB 9.8 (L) 13.5 - 17.5 g/dL    HCT 57.8 (L) 46.9 - 53.0 %    MCV 104.0 (H) 80.0 - 100.0 fL    MCH 35.2 (H) 26.0 - 34.0 pg    MCHC 33.8 31.0 - 37.0 g/dL    RDW 62.9 52.8 - 41.3 %    MPV 7.6 7.0 - 10.0 fL    Platelet 315 150 - 440 10*9/L   Magnesium Level   Result Value Ref Range    Magnesium 1.7 1.6 - 2.2 mg/dL   Basic metabolic panel   Result Value Ref Range    Sodium 132 (L) 135 - 145 mmol/L    Potassium 3.3 (L) 3.5 - 5.0 mmol/L    Chloride 99 98 - 107 mmol/L    CO2 26.0 22.0 - 30.0 mmol/L    BUN 12 7 - 21 mg/dL    Creatinine 2.44 0.10 - 1.30 mg/dL    BUN/Creatinine Ratio 16     EGFR MDRD Non Af Amer >=60 >=60 mL/min/1.54m2    EGFR MDRD Af Amer >=60 >=60 mL/min/1.38m2    Anion Gap 7 (L) 9 - 15 mmol/L    Glucose 158 65 - 179 mg/dL    Calcium 8.5 8.5 - 27.2 mg/dL   CBC   Result Value Ref Range    WBC 10.5 4.5 - 11.0 10*9/L    RBC 2.61 (L) 4.50 - 5.90 10*12/L    HGB 9.2 (L) 13.5 - 17.5 g/dL    HCT 53.6 (L) 64.4 - 53.0 %    MCV 106.1 (H) 80.0 - 100.0 fL    MCH 35.2 (H) 26.0 - 34.0 pg    MCHC 33.1 31.0 - 37.0 g/dL  RDW 14.2 12.0 - 15.0 %    MPV 7.8 7.0 - 10.0 fL    Platelet 317 150 - 440 10*9/L   Magnesium Level   Result Value Ref Range    Magnesium 1.7 1.6 - 2.2 mg/dL   Basic metabolic panel   Result Value Ref Range    Sodium 132 (L) 135 - 145 mmol/L    Potassium 3.8 3.5 - 5.0 mmol/L    Chloride 96 (L) 98 - 107 mmol/L    CO2 27.0 22.0 - 30.0 mmol/L    BUN 12 7 - 21 mg/dL    Creatinine 2.53 6.64 - 1.30 mg/dL    BUN/Creatinine Ratio 15     EGFR MDRD Non Af Amer >=60 >=60 mL/min/1.63m2    EGFR MDRD Af Amer >=60 >=60 mL/min/1.13m2    Anion Gap 9 9 - 15 mmol/L    Glucose 131 65 - 179 mg/dL    Calcium 8.8 8.5 - 40.3 mg/dL   CBC   Result Value Ref Range    WBC 14.2 (H) 4.5 - 11.0 10*9/L    RBC 2.92 (L) 4.50 - 5.90 10*12/L    HGB 10.1 (L) 13.5 - 17.5 g/dL    HCT 47.4 (L) 25.9 - 53.0 %    MCV 104.2 (H) 80.0 - 100.0 fL    MCH 34.4 (H) 26.0 - 34.0 pg    MCHC 33.1 31.0 - 37.0 g/dL    RDW 56.3 87.5 - 64.3 %    MPV 7.8 7.0 - 10.0 fL    Platelet 357 150 - 440 10*9/L   ECG 12 Lead   Result Value Ref Range    EKG Systolic BP  mmHg    EKG Diastolic BP  mmHg    EKG Ventricular Rate 69 BPM    EKG Atrial Rate 69 BPM    EKG P-R Interval 158 ms    EKG QRS Duration 96 ms    EKG Q-T Interval 402 ms    EKG QTC Calculation 430 ms    EKG Calculated P Axis 75 degrees    EKG Calculated R Axis 76 degrees    EKG Calculated T Axis 134 degrees   Type and Screen   Result Value Ref Range    Blood Type O POS     Antibody Screen NEG    ABO/RH   Result Value Ref Range    Reject/Recollect REJECT    CBC w/ Differential   Result Value Ref Range    WBC 16.0 (H) 4.5 - 11.0 10*9/L    RBC 2.82 (L) 4.50 - 5.90 10*12/L    HGB 10.2 (L) 13.5 - 17.5 g/dL    HCT 32.9 (L) 51.8 - 53.0 %    MCV 104.6 (H) 80.0 - 100.0 fL    MCH 36.1 (H) 26.0 - 34.0 pg    MCHC 34.5 31.0 - 37.0 g/dL    RDW 84.1 66.0 - 63.0 %    MPV 7.8 7.0 - 10.0 fL    Platelet 314 150 - 440 10*9/L    Neutrophil Left Shift 2+ (A) Not Present    Absolute Neutrophils 13.7 (H) 2.0 - 7.5 10*9/L    Absolute Lymphocytes 1.2 (L) 1.5 - 5.0 10*9/L    Absolute Monocytes 0.6 0.2 - 0.8 10*9/L    Absolute Eosinophils 0.1 0.0 - 0.4 10*9/L    Absolute Basophils 0.0 0.0 - 0.1 10*9/L    Macrocytosis Moderate (A) Not Present         Adriauna Campton  Dorthula Matas, MD

## 2018-01-08 ENCOUNTER — Encounter: Admit: 2018-01-08 | Discharge: 2018-01-09 | Payer: MEDICARE

## 2018-01-08 DIAGNOSIS — K269 Duodenal ulcer, unspecified as acute or chronic, without hemorrhage or perforation: Principal | ICD-10-CM

## 2018-01-15 NOTE — Unmapped (Signed)
[facility] - Pharmacist Onboarding Note    Subq Actemra approved through the mfg assistance program ([facility]).  Spoke with patient's daughter, Lawson Fiscal, who prefers to come into clinic for injection training and side effect counseling.  Provided patient's daughter with manufacturer phone number to schedule delivery - [phone].  Patient's daughter will call to schedule an appointment for first injection once they receive the shipment.     All questions were answered and contact information provided for any future questions/concerns.      Simmie Davies

## 2018-01-16 MED ORDER — PANTOPRAZOLE 40 MG TABLET,DELAYED RELEASE
ORAL_TABLET | Freq: Two times a day (BID) | ORAL | 0 refills | 0.00000 days | Status: CP
Start: 2018-01-16 — End: 2018-02-15

## 2018-01-17 NOTE — Unmapped (Signed)
-----   Message from Girtha Hake, MD sent at 01/15/2018 12:14 PM EST -----  Your stool test did not show signs of infection causing your ulcer. So you should not have to take the heavy antibiotics that are used for that condition. Continue the PANTOPRAZOLE (PROTONIX) and avoid medications that contain IBUPROFEN (Motrin, Advil, etc.) and NAPROXEN (Naprosyn, Aleve, etc).

## 2018-01-17 NOTE — Unmapped (Signed)
Spoke with pt regarding lab results. Dr. Marolyn Haller would like the pt to continue taking Pantoprazole but the pt was only given a one month supply so he will need a refill to continue the medication.

## 2018-01-17 NOTE — Unmapped (Signed)
Addended by: Burnice Logan on: 01/16/2018 05:52 PM     Modules accepted: Orders

## 2018-01-20 NOTE — Unmapped (Signed)
Complaint:  Patient called with the following complaint bilateral feet, ankle and lower leg swelling worse on left, also arm swelling.  Patient has had symptoms for 1 1/2 weeks.  Patient feels their symptoms are worsening:no  .  Patient has noticed improvement in symptoms: no.  Treatments tried so far include elevation. Patient has been to the ED for this compliant:no.  Has tried over the counter medications:no.  Has the patient been seen previously [facility] for this issue previously:no.  Offered appointment on Friday PM , states she has no transportation as his daughter has to work.Offered appt with another provider , he refused. Refused going to ED. Wanted me to send you a message and see what you think he should do.

## 2018-01-20 NOTE — Unmapped (Signed)
Erik [name] would like a Nurse Call regarding fluid that is collecting around his ankles.  He is having difficulty putting shoes on.  Please advise:

## 2018-01-21 NOTE — Unmapped (Signed)
Erik [name] is an 84 year old man who is sent to me in consultation in the context of an ischemic optic neuropathy by Dr. Dellie Burns. I first saw him on October 20, 2017 and he was subsequently diagnosed with biopsy-proven temporal arteritis. I am seeing him today in follow-up.    History of present illness (10/20/17):    I have personally reviewed the notes from Dr. Dellie Burns from October 02, 2017. These report that the patient's visual acuities were 20/20 in the right eye and no light perception in the left eye. This also reports that the patient has a swollen left optic nerve. It was recommended that the patient undergo a CT scan of her head and orbits as well as a CBC, sedimentation rate, and CRP. The included ESR was normal at 5, the platelets were normal at 248, and the CRP was also normal at 2.06. The report from the CT scan of the head and orbits with contrast reports questioning a slight asymmetric enlargement of the distal left optic nerve. It was otherwise normal.    Today the patient reports that he saw a flicker in his left eye.  He went to see Dr. Dellie Burns on October 02, 2017.  He was found to have NLP vision in the left eye and Dr. Dellie Burns noted swelling of his left optic disc.  His labs were normal that day.  He has never had headache or scalp tenderness.  He has had a pinched nerve in his neck for 4 years.  He has had some pain in the left ear that extends down the side of his face toward his neck.  His jaw can get tired but he doesn't have pain with this.  No real consistent weight loss.  No PMR symptoms.      The vision has not really improved or worsened since he was last seen by Dr. Dellie Burns.     Interval history (11/17/17): After I saw the patient I had him evaluated by  Dr. Gertie Baron on November 1 at which time a temporal artery biopsy was done which ultimately came back positive for giant cell arteritis. These results were communicated to me and I contacted the patient and started him on prednisone and omeprazole. I also requested a rheumatology consult. Today the patient reports     Interval history (01/22/18): When I last saw the [name] I felt that he was stable on exam with a normal right eye and continued severe vision loss in his left eye.  He was planning to see the rheumatologist the following week and my hope is that he would be started on Actemra.  He was seen by Dr. Scarlette Calico on November 28, 2017 and she agreed with the plan to start Actemra.  She also recommended getting carotid Dopplers and echocardiogram.  Unfortunately after that visit in late December he had several episodes of melena and presented to the hospital where he was found to have a duodenal ulcer.  He was placed on a PPI drip and his hemoglobin stabilized.    Data reviewed:  I have reviewed the pathology report from October 23, 2017. This reports that the segment of temporal artery was positive for active temporal arteritis.    Assessment and plan:  [phone] cell    [phone] cell Lawson Fiscal)    Shivam has recently suffered severe vision loss in his left eye with associated optic nerve head swelling. This is in the context of biopsy proven GCA.  Fortunately he is stable on exam today with  a normal right eye without any interval decrease in his vision or visual field.  His symptoms are under control.  Unfortunately he recently suffered a GI bleed in the context of the steroids.  This is now apparently under control.  He has recently (today) started on Actemra and the rheumatology team is tapering his steroids.  I certainly agree with this plan but I mentioned to him that if he notices any change in vision in that he should call me immediately.  Otherwise I will see him in follow-up in 4 months.  I appreciate the help of the rheumatology team and dealing with this issue.    Regarding his pseudophakia his right eye is seeing quite well. Regarding his bilateral dry macular degeneration this does not appear to be greatly affecting his visual functioning currently.      Brooks Sailors, M.D.

## 2018-01-22 ENCOUNTER — Encounter: Admit: 2018-01-22 | Discharge: 2018-01-22 | Payer: MEDICARE | Attending: Ambulatory Care | Primary: Ambulatory Care

## 2018-01-22 ENCOUNTER — Encounter: Admit: 2018-01-22 | Discharge: 2018-01-22 | Payer: MEDICARE

## 2018-01-22 DIAGNOSIS — H47019 Ischemic optic neuropathy, unspecified eye: Principal | ICD-10-CM

## 2018-01-22 DIAGNOSIS — M316 Other giant cell arteritis: Principal | ICD-10-CM

## 2018-01-22 NOTE — Unmapped (Signed)
[facility] - PHARMACIST CLINIC NOTES    ASSESSMENT  Erik [name] is a 84 y.o. male with Giant Cell Arteritis who presents for Actemra injection training.    PLAN  Reviewed injection technique, medication administration instructions, and potential side effects.     Regimen: Actemra 162 mg subcutaneous injection once weekly  Storage:  Refrigerated  Side-effects:    ?? Discussed the risk of injection site reactions, headache, elevated liver enzymes, infections, abnormalities in blood counts, malignancy, and hyperlipidemia  ?? In the case of signs of infections including fever, discomfort when urinating, sinus congestion, or other complaints, patient should hold the next dose of Actemra and call clinic to ensure adequate medical care  ?? In the case where a planned procedure is scheduled or live vaccines are indicated, patient should contact the clinic for further recommendation  ?? Counseled on signs of a significant drug reaction (wheezing, chest tightness, fever, itching, cough, blue skin color, seizures, swelling of face, lips, tongue or throat, etc)    Injection Training:   ?? Reviewed injection techniques and patient self-administered the first dose of Actemra in clinic without issues.  Proper disposal of sharps addressed.  Injection site-reactions discussed.    Emphasized the importance of adherence to prescribed regimen, clinic follow-up visits, and laboratory testing.      All questions were answered and contact information provided for any future questions/concerns.      Simmie Davies, PharmD, BCPS  PGY2 Ambulatory Care Pharmacy Resident

## 2018-01-22 NOTE — Unmapped (Signed)
States his swelling resolved with elevation of his bed . Sttes he is not going to ED. Offered appt states he will let us know.

## 2018-01-22 NOTE — Unmapped (Signed)
I agree that he should go to ED.

## 2018-01-22 NOTE — Unmapped (Signed)
Outreached to patient to schedule Annual Wellness Visit.  Patient was unable to be contacted. Left Message. Will call back to schedule

## 2018-02-04 ENCOUNTER — Encounter: Admit: 2018-02-04 | Discharge: 2018-02-04 | Payer: MEDICARE

## 2018-02-04 DIAGNOSIS — M316 Other giant cell arteritis: Principal | ICD-10-CM

## 2018-02-04 DIAGNOSIS — Z7952 Long term (current) use of systemic steroids: Principal | ICD-10-CM

## 2018-02-04 NOTE — Unmapped (Signed)
You were seen at [facility] today.     We will taper the prednisone as follows:     Using prednisone 20 mg tabs:   Prednisone 40 mg (2 tabs) daily x 1 week  Prednisone 30 mg (1.5 tabs) daily x 1 week  Prednisone 20 mg (1 tab) daily x 1 week    Then, switch to 10 mg tabs:   Prednisone 15 mg (1.5 tab) daily x 2 week  Prednisone 10 mg (1 tab) daily x 2 week  Prednisone 5 mg (0.5 tab) daily x 2 weeks, then stop     Continue actemra weekly injections.     We will follow-up the dexa scan results.

## 2018-02-04 NOTE — Unmapped (Signed)
[facility] Note    Assessment/Recommendations:   84 y.o. smoker with h/o COPD, CAD s/p CABG,  HTN and active alcohol use presenting for follow-up of  biopsy proven giant cell arteritis complicated by vision loss in left eye.  He established care 11/28/17.  Currently on prednisone 50 mg daily (did not decrease to prednisone 40 mg daily as recommended at last visit) and tocilizumab 162 mg weekly.  GCA symptoms controlled. No new or concerning symptoms or physical exam findings.     Since last visit, admitted and treated for upper GIB 2/2 duodenal ulcer (likely due 2/2 concurrent ibuprofen, alcohol and prednisone use).   Discussed plan to taper prednisone now that on tocilizumab.  Also discussed thoroughly with patient risk of gastrointestinal perforation with tocilizumab.  Risk of lower GI tract perforation  (particularly in patients with diverticulitis) is more common but upper GI tract perforation has also been described.  Discussed that patient is at risk of this given his h/o duodenal ulceration and continued alcohol use.  Discussed that prednisone use would also place him a risk.  Patient acknowledged risk and wishes to proceed with treatment of his GCA with tocilizumab as planned.  Counseled patient extensively on the signs and symptoms associated with gastrointestinal perforation.  Recommended alcohol cessation under the guidance of a trained healthcare professional.        - Continue Actemra 162 mg weekly   - Recommend prednisone taper as follows:   Prednisone 40 mg daily x 1 week   Prednisone 30 mg daily x 1 week   Prednisone 20 mg daily x 1 week   Prednisone 15 mg daily x 2 week   Prednisone 10 mg daily x 2 week   Prednisone 5 mg daily x 2 week, then off  - Continue PPI for gastric protection  - Recommend Calcium + Vitamin D while on prednisone;  Patient currently refusing to take calcium as he thinks this is what caused his GI bleed   - Will follow-up DEXA scan results    RTC in 3 months or sooner prn.      Patient was seen and evaluated by attending physician,  Dr Scarlette Calico    ___________________________________________________________________    Referring Provider:  Gerhard Perches Sitko    Primary Care Provider: Burnice Logan, MD    Reason for Visit: Evaluation GCA     HPI:  Erik [name] is a 84 y.o. smoker with h/o COPD, CAD s/p CABG,  HTN  and ongoing alcohol use presenting for follow-up of biopsy proven giant cell arteritis complicated by vision loss in left eye. Last seen 11/2017.  He is currently on prednisone 50 mg daily and recently started actemra weekly SQ injections.    Since last visit, patient admitted to [facility] with upper GI bleed due to duodenal ulceration.  He was treated with IV protonix.  He did not require blood transfusion.  Recommendation was for patient to remain on prednisone 50 mg daily given concern for flare of GCA if tapered too quickly.   Suspect patient's duodenal ulcer was due to combination of regular alcohol use, ibuprofen and prednisone.  Of note, he was advised to take PPI to protect his stomach while on high dose steroid but did not do so because it was too expensive.  He blames his GI issues to having to take calcium (recommended at his last visit due to prednisone use).  He is upset about this and  Is refusing to take the calcium now.  He still takes his vitamin D.     Regarding GCA symptoms, he says everything is stable. No headaches or new vision changes. Vision in left eye remains impaired.  Has some mild jaw pain with repetitive chewing.  No temple tenderness. No scalp tenderness. No shoulder or hip pain/stiffness. No fevers. No weight loss (had lost a few pounds but has gained it back now). No night sweats.  No new rashes. No chest pain or chest pressure. No shortness of breath. No new cough.     He continues to smoke.  He is not interested in quitting.   He continues to drink alcohol daily and stated several times during appointment that he plans to stop off to get some alcohol on the way home.  He is not interested in quitting. He says he cut back in the past and is happy that he no longer drinks a 12 pack before breakfast like he used to.       Review of Systems:   All other systems were reviewed and were negative    Medical History:  Past Medical History:   Diagnosis Date   ??? Cataract 2005    cataract surgery   ??? COPD (chronic obstructive pulmonary disease) (CMS-HCC) 10/07/2016   ??? DJD (degenerative joint disease), lumbar    ??? Hypertension    ??? Tobacco abuse 02/15/2014       Allergies:  Patient has no known allergies.    Medications:     Current Outpatient Prescriptions:   ???  amLODIPine (NORVASC) 10 MG tablet, TAKE 1 TABLET BY MOUTH ONCE DAILY, Disp: 30 tablet, Rfl: 2  ???  ascorbic acid (VITAMIN C) 500 MG tablet, Take 500 mg by mouth daily., Disp: , Rfl:   ???  aspirin (ECOTRIN) 81 MG tablet, Take 81 mg by mouth daily., Disp: , Rfl:   ???  atenolol (TENORMIN) 50 MG tablet, Take 1 tablet (50 mg total) by mouth Two (2) times a day., Disp: 60 tablet, Rfl: 11  ???  cyanocobalamin (VITAMIN B-12) 1000 MCG tablet, Take 1,000 mcg by mouth daily., Disp: , Rfl:   ???  hydrALAZINE (APRESOLINE) 25 MG tablet, Take 1 tablet (25 mg total) by mouth daily., Disp: 30 tablet, Rfl: 11  ???  losartan (COZAAR) 100 MG tablet, Take 1 tablet (100 mg total) by mouth daily., Disp: 30 tablet, Rfl: 11  ???  multivitamin (TAB-A-VITE/THERAGRAN) per tablet, Take 1 tablet by mouth daily., Disp: , Rfl:   ???  oxyCODONE-acetaminophen (PERCOCET) 5-325 mg per tablet, Take 1 tablet by mouth every four (4) hours as needed for pain., Disp: 120 tablet, Rfl: 0  ???  pantoprazole (PROTONIX) 40 MG tablet, Take 1 tablet (40 mg total) by mouth Two (2) times a day (30 minutes before a meal)., Disp: 60 tablet, Rfl: 0  ???  predniSONE (DELTASONE) 20 MG tablet, Take 50 mg by mouth daily., Disp: , Rfl:   ???  simvastatin (ZOCOR) 10 MG tablet, Take 1 tablet (10 mg total) by mouth daily., Disp: 90 tablet, Rfl: 1  ???  tocilizumab (ACTEMRA) 162 mg/0.9 mL Syrg subcutaneous injection, Inject 162 mg under the skin every seven (7) days., Disp: , Rfl:     Surgical History:  Past Surgical History:   Procedure Laterality Date   ??? CARDIAC VALVE REPLACEMENT      triple bypass   ??? CATARACT EXTRACTION     ??? CORONARY ARTERY BYPASS GRAFT  2001    x3   ??? EYE SURGERY  2005  cataracts   ??? PR REPAIR ING HERNIA,5+Y/O,REDUCIBL Left 06/19/2015    Procedure: REPAIR INITIAL INGUINAL HERNIA, AGE 63 YEARS OR OLDER; REDUCIBLE;  Surgeon: Shelda Jakes, MD;  Location: [facility];  Service: Trauma   ??? PR TEMPORAL ARTERY LIGATN OR BX Left 10/23/2017    Procedure: LIGATION OR BIOPSY, TEMPORAL ARTERY;  Surgeon: Vivien Rossetti, MD;  Location: [facility];  Service: ENT   ??? PR UPPER GI ENDOSCOPY,DIAGNOSIS N/A 12/17/2017    Procedure: UGI ENDO, INCLUDE ESOPHAGUS, STOMACH, & DUODENUM &/OR JEJUNUM; DX W/WO COLLECTION SPECIMN, BY BRUSH OR WASH;  Surgeon: Bluford Kaufmann, MD;  [facility]: [facility];  Service: Gastroenterology       Social History:  Lives in [place], [place]; lives with daughter  Social History   Substance Use Topics   ??? Smoking status: Current Every Day Smoker     Packs/day: 0.50     Years: 4.00   ??? Smokeless tobacco: Never Used      Comment: quit for 25 years, restarted 2014   ??? Alcohol use 2.4 oz/week     4 Cans of beer per week      Comment: daily       Family History:  Family History   Problem Relation Age of Onset   ??? Thyroid disease Sister    ??? Cancer Brother    ??? Glaucoma Neg Hx    ??? Macular degeneration Neg Hx    ??? Retinal detachment Neg Hx        Objective   Vitals:    02/04/18 1439   BP: 140/64   Pulse: 71   Temp: 36.3 ??C   TempSrc: Oral   Weight: 59 kg (130 lb)       Physical Exam  General:   Patient is thin; NAD   Eyes:   PERRLA, EOMI, and sclera aniteric. No temporal artery tenderness, + TA pulses bilaterally   ENT:   MMM. Oropharhynx without any erythema or exudate.  No oropharyngeal exudates or ulcerations, No scalp tenderness. No jaw tenderness   Neck:   Supple,, no cervical lymphadenopathy   Cardiovascular:  RRR.  S1 and S2 normal, without any murmur, rub, or gallop. No appreciable carotid bruits.  Distal pulses intact bilaterally    Pulmonary:  Normal WOB.  Symmetric chest expansion. Scattered. End expiratory wheezes    Skin:    No rash.lesions/breakdown. Normal turgor and elasticity.         Psychiatry:   Alert and oriented to person, place, and time. Mood and affect appropriate and congruent.    GI:   No abdominal distension or pain   Extremities:   Warm and well perfused. No bilateral cyanosis, clubbing or edema.   Musculo Skeletal:   No joint tenderness, deformity, effusions. Distal tip of thumb on left missing. Full range of motion in shoulder, elbow, hip, knee, ankle, hands and feet. Knees and ankles cool B/L without effusions. + Kyphosis   Neurological:  Alert and oriented to person, place and time.  Cranial nerves III-XII grossly intact.  Strength grossly intact       Test Results    Final Diagnosis   A: Temporal artery, left, biopsy   - Segment of temporal artery, positive for active temporal arteritis.

## 2018-02-06 MED ORDER — OXYCODONE-ACETAMINOPHEN 5 MG-325 MG TABLET
ORAL_TABLET | ORAL | 0 refills | 0 days | Status: CP | PRN
Start: 2018-02-06 — End: 2018-03-05

## 2018-02-06 NOTE — Unmapped (Signed)
Patient called stating he needs a refill on Percocet sent today to [facility].  I advised him in the future to call Pharmacy first.  He does not understand the process even after explaining for 5 min. Please advise patient when sent.

## 2018-02-06 NOTE — Unmapped (Signed)
P/C requesting refill on Percocet , last ordered on 01/02/18, last visit 01/02/18.Controlled Substance Contract signed on 04/11/17. UDS obtained on 04/11/17 and was appropriate.

## 2018-03-05 NOTE — Unmapped (Signed)
P/C requesting refill on Percocet , last ordered on 02/06/18, last visit 01/02/18. Controlled Substance Contract signed on 04/11/17. UDS obtained on 04/11/17 and was appropriate.

## 2018-03-08 MED ORDER — OXYCODONE-ACETAMINOPHEN 5 MG-325 MG TABLET
ORAL_TABLET | ORAL | 0 refills | 0 days | Status: CP | PRN
Start: 2018-03-08 — End: 2018-04-03

## 2018-03-31 MED ORDER — AMLODIPINE 10 MG TABLET
ORAL_TABLET | Freq: Every day | ORAL | 1 refills | 0.00000 days | Status: CP
Start: 2018-03-31 — End: 2018-07-03

## 2018-03-31 NOTE — Unmapped (Signed)
Erik [name] called to say he only has 1 Amalodopine pill left for in the morning.  Please send in refill asap.

## 2018-04-03 ENCOUNTER — Ambulatory Visit: Admit: 2018-04-03 | Discharge: 2018-04-04 | Payer: MEDICARE

## 2018-04-03 DIAGNOSIS — M316 Other giant cell arteritis: Secondary | ICD-10-CM

## 2018-04-03 DIAGNOSIS — M5440 Lumbago with sciatica, unspecified side: Secondary | ICD-10-CM

## 2018-04-03 DIAGNOSIS — G8929 Other chronic pain: Secondary | ICD-10-CM

## 2018-04-03 DIAGNOSIS — I10 Essential (primary) hypertension: Principal | ICD-10-CM

## 2018-04-03 DIAGNOSIS — D649 Anemia, unspecified: Secondary | ICD-10-CM

## 2018-04-03 MED ORDER — ATENOLOL 50 MG TABLET
ORAL_TABLET | Freq: Two times a day (BID) | ORAL | 11 refills | 0.00000 days | Status: CP
Start: 2018-04-03 — End: 2018-04-03

## 2018-04-03 MED ORDER — LOSARTAN 100 MG TABLET
ORAL_TABLET | Freq: Every day | ORAL | 3 refills | 0.00000 days | Status: CP
Start: 2018-04-03 — End: 2019-05-05

## 2018-04-03 MED ORDER — HYDRALAZINE 25 MG TABLET: 25 mg | tablet | Freq: Every day | 11 refills | 0 days | Status: AC

## 2018-04-03 MED ORDER — HYDRALAZINE 25 MG TABLET
ORAL_TABLET | Freq: Every day | ORAL | 11 refills | 0.00000 days | Status: CP
Start: 2018-04-03 — End: 2018-04-03

## 2018-04-03 MED ORDER — ATENOLOL 50 MG TABLET: 50 mg | tablet | Freq: Two times a day (BID) | 11 refills | 0 days | Status: AC

## 2018-04-03 NOTE — Unmapped (Signed)
Name:  Erik [name]  DOB: 03/22/1934  Today's Date: 04/03/2018  Age:  84 y.o.    Assessment and Plan:     Sundeep was seen today for copd, back pain and ulcer.    Diagnoses and all orders for this visit:    Anemia, unspecified type  -     CBC; Future    Essential hypertension, benign  -     Discontinue: atenolol (TENORMIN) 50 MG tablet; Take 1 tablet (50 mg total) by mouth Two (2) times a day.  -     Discontinue: hydrALAZINE (APRESOLINE) 25 MG tablet; Take 1 tablet (25 mg total) by mouth daily.  -     hydrALAZINE (APRESOLINE) 25 MG tablet; Take 1 tablet (25 mg total) by mouth daily.  -     losartan (COZAAR) 100 MG tablet; Take 1 tablet (100 mg total) by mouth daily.  -     atenolol (TENORMIN) 50 MG tablet; Take 1 tablet (50 mg total) by mouth Two (2) times a day.    Chronic bilateral low back pain with sciatica, sciatica laterality unspecified  -     oxyCODONE-acetaminophen (PERCOCET) 5-325 mg per tablet; Take 1 tablet by mouth every four (4) hours as needed for pain. DO NOT FILL UNTIL 3/17/9.    Giant cell arteritis (CMS-HCC)             HPI:      Erik [name]  is here for   Chief Complaint   Patient presents with   ??? COPD     f/u   ??? Back Pain     f/u   ??? Ulcer     f/u       He is here for follow-up. His GCA is being followed/managed by ophthalmology and rheumatology. He states that he feels well at this time. His left eye vision has not improved (few shadows only). Prednisone is being tapered. He is now on tocilizumab injections.   His back pain is chronic and stable.  He has had no abdominal pain, nausea, melena, or hematochezia (history of bleeding PUD, H. Pylori negative, treated).  No problems with current medications. Some mild constipation.     ROS:      Review of Systems    REVIEW OF SYSTEMS:    Constitutional: He has gained about 15 lbs. On PREDNISONE. No fever, chills.  HEAD/FACE: No complaints.  EYES: No complaints.  ENTM: No complaints regarding ears. No complaints regarding nose. No complaints regarding throat. No complaints regarding mouth.  Respiratory: No increase in his chronic shortness of breath. No prolonged cough.  Cardiovascular: No exertional chest pain. No palpitations.   Gastrointestinal: See HPI.  Skin: No complaints.  Musculoskeletal: No complaints not listed in HPI.  Hematologic/Lymphatic/Immunologic: No complaints.     All other systems reviewed negative.    Vital Signs:     Wt Readings from Last 3 Encounters:   04/03/18 63.5 kg (140 lb)   02/04/18 59 kg (130 lb)   01/02/18 56.7 kg (125 lb)     Temp Readings from Last 3 Encounters:   04/03/18 36.9 ??C (98.4 ??F) (Oral)   02/04/18 36.3 ??C (97.3 ??F) (Oral)   12/19/17 36.2 ??C (97.2 ??F) (Oral)     BP Readings from Last 3 Encounters:   04/03/18 136/50   02/04/18 140/64   01/02/18 144/62     Pulse Readings from Last 3 Encounters:   04/03/18 65   02/04/18 71   01/02/18 66  Estimated body mass index is 27.34 kg/m?? as calculated from the following:    Height as of this encounter: 152.4 cm (5').    Weight as of this encounter: 63.5 kg (140 lb).  Facility age limit for growth percentiles is 20 years.        Objective:      Physical Exam    PHYSICAL EXAM:     CONSTITUTIONAL: Alert, oriented, in no apparent distress.  LYMPHATIC:  No lymphadenopathy.  CARDIOVASCULAR: Heart: Regular rhythm. No S3. No murmur.  LUNGS: Clear to auscultation.  GASTROINTESTINAL: Abdomen soft. Non-distended. Non-tender.   MUSCULOSKELETAL: No clubbing. Trace to 1+ pitting bilateral pretibial edema.  SKIN:  No rash.   NEURO: Alert, oriented. Speech normal.   PSYCH: Affect normal. Normal eye contact.  HEME/LYMPH/IMMUNE:  No abnormalities noted.          Barriers to goals identified and addressed. Pertinent handouts were given today and reviewed with the patient as indicated.  The Care Plan and Self-Management goals have been included on the AVS and the AVS has been printed.  Any outside resources or referrals needed at this time are noted above. Patient's current medications have been reviewed.  Patient voiced understanding and all questions have been answered to satisfaction.   Medical records including recent encounters, pertinent imaging, and pertinent labs reviewed.      Past Medical/Surgical History:     Past Medical History:   Diagnosis Date   ??? Cataract 2005    cataract surgery   ??? COPD (chronic obstructive pulmonary disease) (CMS-HCC) 10/07/2016   ??? DJD (degenerative joint disease), lumbar    ??? Hypertension    ??? Tobacco abuse 02/15/2014     Past Surgical History:   Procedure Laterality Date   ??? CARDIAC VALVE REPLACEMENT      triple bypass   ??? CATARACT EXTRACTION     ??? CORONARY ARTERY BYPASS GRAFT  2001    x3   ??? EYE SURGERY  2005    cataracts   ??? PR REPAIR ING HERNIA,5+Y/O,REDUCIBL Left 06/19/2015    Procedure: REPAIR INITIAL INGUINAL HERNIA, AGE 70 YEARS OR OLDER; REDUCIBLE;  Surgeon: Shelda Jakes, MD;  [facility];  Service: Trauma   ??? PR TEMPORAL ARTERY LIGATN OR BX Left 10/23/2017    Procedure: LIGATION OR BIOPSY, TEMPORAL ARTERY;  Surgeon: Vivien Rossetti, MD;  Location: [facility] OR [facility];  Service: ENT   ??? PR UPPER GI ENDOSCOPY,DIAGNOSIS N/A 12/17/2017    Procedure: UGI ENDO, INCLUDE ESOPHAGUS, STOMACH, & DUODENUM &/OR JEJUNUM; DX W/WO COLLECTION SPECIMN, BY BRUSH OR WASH;  Surgeon: Bluford Kaufmann, MD;  [facility]: [facility];  Service: Gastroenterology         Family History:     Family History   Problem Relation Age of Onset   ??? Thyroid disease Sister    ??? Cancer Brother    ??? Glaucoma Neg Hx    ??? Macular degeneration Neg Hx    ??? Retinal detachment Neg Hx        Social History:     Social History     Socioeconomic History   ??? Marital status: Widowed     Spouse name: None   ??? Number of children: None   ??? Years of education: None   ??? Highest education level: None   Occupational History   ??? None   Social Needs   ??? Financial resource strain: None   ??? Food insecurity:  Worry: None     Inability: None   ??? Transportation needs:     Medical: None     Non-medical: None   Tobacco Use   ??? Smoking status: Current Every Day Smoker     Packs/day: 0.50     Years: 4.00     Pack years: 2.00   ??? Smokeless tobacco: Never Used   ??? Tobacco comment: quit for 25 years, restarted 2014   Substance and Sexual Activity   ??? Alcohol use: Yes     Alcohol/week: 1.2 oz     Types: 2 Cans of beer per week     Frequency: 4 or more times a week     Drinks per session: 1 or 2     Binge frequency: Never     Comment: daily   ??? Drug use: No   ??? Sexual activity: None   Lifestyle   ??? Physical activity:     Days per week: None     Minutes per session: None   ??? Stress: None   Relationships   ??? Social connections:     Talks on phone: None     Gets together: None     Attends religious service: None     Active member of club or organization: None     Attends meetings of clubs or organizations: None     Relationship status: None   ??? Intimate partner violence:     Fear of current or ex partner: None     Emotionally abused: None     Physically abused: None     Forced sexual activity: None   Other Topics Concern   ??? Do you use sunscreen? No   ??? Tanning bed use? No   ??? Are you easily burned? No   ??? Excessive sun exposure? No   ??? Blistering sunburns? Yes     Comment: Past   Social History Narrative    Lives in [place], wife passed 7 years ago. His daughter lives with him. Granddaughter, Victorino Dike, manages his finances.          Allergies:     Patient has no known allergies.    Current Medications:     Current Outpatient Medications   Medication Sig Dispense Refill   ??? amLODIPine (NORVASC) 10 MG tablet Take 1 tablet (10 mg total) by mouth daily. 90 tablet 1   ??? ascorbic acid (VITAMIN C) 500 MG tablet Take 500 mg by mouth daily.     ??? aspirin (ECOTRIN) 81 MG tablet Take 81 mg by mouth daily.     ??? atenolol (TENORMIN) 50 MG tablet Take 1 tablet (50 mg total) by mouth Two (2) times a day. 180 tablet 3   ??? cyanocobalamin (VITAMIN B-12) 1000 MCG tablet Take 1,000 mcg by mouth daily.     ??? hydrALAZINE (APRESOLINE) 25 MG tablet Take 1 tablet (25 mg total) by mouth daily. 90 tablet 11   ??? losartan (COZAAR) 100 MG tablet Take 1 tablet (100 mg total) by mouth daily. 90 tablet 3   ??? multivitamin (TAB-A-VITE/THERAGRAN) per tablet Take 1 tablet by mouth daily.     ??? [START ON 04/07/2018] oxyCODONE-acetaminophen (PERCOCET) 5-325 mg per tablet Take 1 tablet by mouth every four (4) hours as needed for pain. DO NOT FILL UNTIL 3/17/9. 120 tablet 0   ??? predniSONE (DELTASONE) 20 MG tablet Take 50 mg by mouth daily.     ??? simvastatin (ZOCOR) 10 MG tablet Take 1 tablet (10  mg total) by mouth daily. 90 tablet 1   ??? tocilizumab (ACTEMRA) 162 mg/0.9 mL Syrg subcutaneous injection Inject 162 mg under the skin every seven (7) days.       No current facility-administered medications for this visit.          POCT:     Results for orders placed or performed in visit on 01/02/18   H. pylori antigen, stool   Result Value Ref Range    H. pylori Antigen Stool Negative Negative   CBC   Result Value Ref Range    WBC 16.6 (H) 4.5 - 11.0 10*9/L    RBC 3.03 (L) 4.50 - 5.90 10*12/L    HGB 10.5 (L) 13.5 - 17.5 g/dL    HCT 16.1 (L) 09.6 - 53.0 %    MCV 107.4 (H) 80.0 - 100.0 fL    MCH 34.5 (H) 26.0 - 34.0 pg    MCHC 32.1 31.0 - 37.0 g/dL    RDW 04.5 40.9 - 81.1 %    MPV 8.4 7.0 - 10.0 fL    Platelet 412 150 - 440 10*9/L         Osvaldo Lamping Dorthula Matas, MD

## 2018-04-07 MED ORDER — OXYCODONE-ACETAMINOPHEN 5 MG-325 MG TABLET
ORAL_TABLET | ORAL | 0 refills | 0.00000 days | Status: CP | PRN
Start: 2018-04-07 — End: 2018-05-06

## 2018-04-16 ENCOUNTER — Encounter: Admit: 2018-04-16 | Discharge: 2018-04-16 | Disposition: A | Discharge status: Home / Self Care | Payer: MEDICARE

## 2018-04-16 DIAGNOSIS — R05 Cough: Principal | ICD-10-CM

## 2018-04-16 MED ORDER — AZITHROMYCIN 250 MG TABLET
ORAL_TABLET | Freq: Every day | ORAL | 0 refills | 0.00000 days | Status: CP
Start: 2018-04-16 — End: 2018-04-20

## 2018-04-16 NOTE — Unmapped (Signed)
[facility] Emergency Department Provider Note    ED Clinical Impression     Final diagnoses:   Chronic obstructive pulmonary disease, unspecified COPD type (CMS-HCC) (Primary)     Initial Impression, ED Course, Assessment and Plan     Erik [name] is a 84 y.o. male with PMH of COPD (smoker), retinal artery occlusion (on prednisone), HTN who presented to the ED with concerns for productive cough and congestion for 1 week. This is in the setting of recently finishing prednisone for retinal artery occlusion. Pt is also a smoker and has been previously diagnosed with COPD. No CP, no SOB, no abdominal pain, no fevers, no chills, no sore throat, no ear pain, no N/V/D. VSS.     PE demonstrates: Diffuse wheezing with dry cough. Mild nasal congestion. HRRR. Abdomen soft, non-tender.     Pt was given duonebs x2 which resulted in improvement in breathing. He was given the first dose of Azithromycin after CXR was negative for PNA to treat for COPD exacerbation. Also discussed taking Claritin, allegra or zyrtec at home for possible allergies, given that this happens to him every spring and fall. Discussed possible admission due to oxygen saturation of 93% but pt denied, wants to go home and will return if worsening. Discussed STRICT return precautions.     BP 131/61  - Temp 36.1 ??C (97 ??F) (Oral)  - Resp 18  - SpO2 93%   ____________________________________________    Time seen: April 16, 2018 10:56 AM    I have reviewed the triage vital signs and the nursing notes.      History     Chief Complaint  Congestion    HPI   Erik [name] is a 84 y.o. male with PMH of COPD (smoker), retinal artery occlusion (on prednisone), HTN who presented to the ED with concerns for productive cough and congestion for 1 week. Pt reports taking prednisone for a retinal artery occlusion and just stopped taking it on Sunday (about 5 days ago). He reports the cough has gotten worse since stopping this. It is productive and he is wheezing. He normally smokes a lot but he has been unable to in the last few days (reports only smoking 4 cigarettes yesterday). He reports this happens every fall and spring. He is not taking a medication for allergies. Pt has been taking mucinex and tylenol for symptoms with no relief. He denies daily inhalers or diagnosis of COPD. No CP, no abdominal pain, no fevers, no chills.     Past Medical History:   Diagnosis Date   ??? Cataract 2005    cataract surgery   ??? COPD (chronic obstructive pulmonary disease) (CMS-HCC) 10/07/2016   ??? DJD (degenerative joint disease), lumbar    ??? Hypertension    ??? Tobacco abuse 02/15/2014     Past Surgical History:   Procedure Laterality Date   ??? CARDIAC VALVE REPLACEMENT      triple bypass   ??? CATARACT EXTRACTION     ??? CORONARY ARTERY BYPASS GRAFT  2001    x3   ??? EYE SURGERY  2005    cataracts   ??? PR REPAIR ING HERNIA,5+Y/O,REDUCIBL Left 06/19/2015    Procedure: REPAIR INITIAL INGUINAL HERNIA, AGE 84 YEARS OR OLDER; REDUCIBLE;  Surgeon: Shelda Jakes, MD;  Location: [facility] OR [facility];  Service: Trauma   ??? PR TEMPORAL ARTERY LIGATN OR BX Left 10/23/2017    Procedure: LIGATION OR BIOPSY, TEMPORAL ARTERY;  Surgeon: Vivien Rossetti, MD;  Location: [facility] OR [facility];  Service: ENT   ??? PR UPPER GI ENDOSCOPY,DIAGNOSIS N/A 12/17/2017    Procedure: UGI ENDO, INCLUDE ESOPHAGUS, STOMACH, & DUODENUM &/OR JEJUNUM; DX W/WO COLLECTION SPECIMN, BY BRUSH OR WASH;  Surgeon: Bluford Kaufmann, MD;  Location: [facility];  Service: Gastroenterology     No current facility-administered medications for this encounter.     Current Outpatient Medications:   ???  amLODIPine (NORVASC) 10 MG tablet, Take 1 tablet (10 mg total) by mouth daily., Disp: 90 tablet, Rfl: 1  ???  ascorbic acid (VITAMIN C) 500 MG tablet, Take 500 mg by mouth daily., Disp: , Rfl:   ???  aspirin (ECOTRIN) 81 MG tablet, Take 81 mg by mouth daily., Disp: , Rfl:   ???  atenolol (TENORMIN) 50 MG tablet, Take 1 tablet (50 mg total) by mouth Two (2) times a day., Disp: 180 tablet, Rfl: 3  ???  azithromycin (ZITHROMAX Z-PAK) 250 MG tablet, Take 1 tablet (250 mg total) by mouth daily. for 4 days 2 by mouth today the 1 by mouth daily for 4 days, Disp: 4 tablet, Rfl: 0  ???  cyanocobalamin (VITAMIN B-12) 1000 MCG tablet, Take 1,000 mcg by mouth daily., Disp: , Rfl:   ???  hydrALAZINE (APRESOLINE) 25 MG tablet, Take 1 tablet (25 mg total) by mouth daily., Disp: 90 tablet, Rfl: 11  ???  losartan (COZAAR) 100 MG tablet, Take 1 tablet (100 mg total) by mouth daily., Disp: 90 tablet, Rfl: 3  ???  multivitamin (TAB-A-VITE/THERAGRAN) per tablet, Take 1 tablet by mouth daily., Disp: , Rfl:   ???  oxyCODONE-acetaminophen (PERCOCET) 5-325 mg per tablet, Take 1 tablet by mouth every four (4) hours as needed for pain. DO NOT FILL UNTIL 3/17/9., Disp: 120 tablet, Rfl: 0  ???  predniSONE (DELTASONE) 20 MG tablet, Take 50 mg by mouth daily., Disp: , Rfl:   ???  simvastatin (ZOCOR) 10 MG tablet, Take 1 tablet (10 mg total) by mouth daily., Disp: 90 tablet, Rfl: 1  ???  tocilizumab (ACTEMRA) 162 mg/0.9 mL Syrg subcutaneous injection, Inject 162 mg under the skin every seven (7) days., Disp: , Rfl:     Allergies  Patient has no known allergies.    Family History   Problem Relation Age of Onset   ??? Thyroid disease Sister    ??? Cancer Brother    ??? Glaucoma Neg Hx    ??? Macular degeneration Neg Hx    ??? Retinal detachment Neg Hx      Social History  Social History     Tobacco Use   ??? Smoking status: Current Every Day Smoker     Packs/day: 0.50     Years: 4.00     Pack years: 2.00   ??? Smokeless tobacco: Never Used   ??? Tobacco comment: quit for 25 years, restarted 2014   Substance Use Topics   ??? Alcohol use: Yes     Alcohol/week: 1.2 oz     Types: 2 Cans of beer per week     Frequency: 4 or more times a week     Drinks per session: 1 or 2     Binge frequency: Never     Comment: daily   ??? Drug use: No     Review of Systems  Constitutional: Negative for fever.  Eyes: Negative for visual changes.  ENT: Negative for sore throat.  Cardiovascular: Negative for chest pain.  Respiratory: Positive for cough. Negative for shortness of breath.  Gastrointestinal: Negative for abdominal pain, vomiting or diarrhea.  Genitourinary: Negative for dysuria.  Musculoskeletal: Negative for back pain.  Skin: Negative for rash.  Neurological: Negative for headaches, weakness or numbness.    Physical Exam     VITAL SIGNS:    ED Triage Vitals [04/16/18 1053]   Enc Vitals Group      BP 146/67      Pulse       SpO2 Pulse 74      Resp 18      Temp 36.1 ??C (97 ??F)      Temp Source Oral      SpO2 92 %     Constitutional: Alert and oriented. Well appearing and in no distress.  Eyes: Conjunctivae are normal.  ENT       Head: Normocephalic and atraumatic.       Nose: Mild congestion.       Mouth/Throat: Mucous membranes are moist.       Neck: No stridor.  Hematological/Lymphatic/Immunilogical: No cervical lymphadenopathy.  Cardiovascular: Normal rate, regular rhythm. Normal and symmetric distal pulses are present in all extremities.  Respiratory: Normal respiratory effort. Diffuse wheezing.   Gastrointestinal: Soft and nontender. There is no CVA tenderness.  Musculoskeletal: Nontender with normal range of motion in all extremities.  Neurologic: Normal speech and language. No gross focal neurologic deficits are appreciated.  Skin: Skin is warm, dry and intact. No rash noted.  Psychiatric: Mood and affect are normal. Speech and behavior are normal.    Course     12:52 PM  Wheezing improved but not gone. CXR negative for PNA. Will treat with Azithromycin for COPD flare. Will give another breathing txt.     Radiology     Xr Chest 2 Views    Result Date: 04/16/2018  EXAM: XR CHEST 2 VIEWS DATE: 04/16/2018 11:38 AM ACCESSION: [id number] UN DICTATED: 04/16/2018 12:26 PM INTERPRETATION LOCATION: [facility] CLINICAL INDICATION: 84 years old Male with COUGH- COPD-  COMPARISON: 12/15/2018 TECHNIQUE: PA and Lateral Chest Radiographs. FINDINGS: Radiographically clear lungs. No pleural effusion or pneumothorax. Unremarkable cardiomediastinal silhouette.     Clear lungs.     Pertinent labs & imaging results that were available during my care of the patient were reviewed by me and considered in my medical decision making (see chart for details).     Vaughan Sine, [place]  04/16/18 1705

## 2018-04-16 NOTE — Unmapped (Signed)
Patient called requesting an appt.  Advised nothing available today or tomorrow.  He wanted to know if he could be worked in or talk to a Engineer, civil (consulting).  I advised him he might have to go to Urgent Care.  He would like to speak with a nurse first.

## 2018-04-16 NOTE — Unmapped (Signed)
Patient transported to X-ray  Transported by Radiology  How tranported Wheelchair  Cardiac Monitor no

## 2018-04-16 NOTE — Unmapped (Signed)
Pt with congestion and productive cough x 1 week. Pt has been taking mucinex and tylenol for symptoms with no relief. Pt reports get same symptoms every spring and fall. Pt with no other concerns

## 2018-04-16 NOTE — Unmapped (Signed)
Called pt he is in ED at present

## 2018-04-16 NOTE — Unmapped (Signed)
Provider at bedside discussing discharge instruction and prescription

## 2018-05-01 MED ORDER — SIMVASTATIN 10 MG TABLET
ORAL_TABLET | Freq: Every day | ORAL | 1 refills | 0.00000 days | Status: CP
Start: 2018-05-01 — End: 2018-10-29

## 2018-05-01 MED ORDER — TRIAMCINOLONE ACETONIDE 0.1 % TOPICAL CREAM
Freq: Two times a day (BID) | TOPICAL | 1 refills | 0 days | Status: CP
Start: 2018-05-01 — End: 2019-05-02

## 2018-05-05 NOTE — Unmapped (Signed)
Patient is calling in to get his refill on oxyCODONE-acetaminophen (PERCOCET) 5-325 mg per tablet and he is wanting to be able to pick these up on Thursday please.

## 2018-05-06 NOTE — Unmapped (Signed)
P/C requesting refill on Percocet , last ordered on 04/07/18, last visit 04/03/18. Controlled Substance Contract signed on 04/11/17. UDS obtained on 04/11/17 and was appropriate

## 2018-05-07 MED ORDER — OXYCODONE-ACETAMINOPHEN 5 MG-325 MG TABLET
ORAL_TABLET | ORAL | 0 refills | 0 days | Status: CP | PRN
Start: 2018-05-07 — End: 2018-06-05

## 2018-05-27 NOTE — Unmapped (Signed)
Erik [name] is an 84 year old man who is sent to me in consultation in the context of an ischemic optic neuropathy by Dr. Dellie Burns. I first saw him on October 20, 2017 and he was subsequently diagnosed with biopsy-proven temporal arteritis. I am seeing him today in follow-up.    History of present illness (10/20/17):    I have personally reviewed the notes from Dr. Dellie Burns from October 02, 2017. These report that the patient's visual acuities were 20/20 in the right eye and no light perception in the left eye. This also reports that the patient has a swollen left optic nerve. It was recommended that the patient undergo a CT scan of her head and orbits as well as a CBC, sedimentation rate, and CRP. The included ESR was normal at 5, the platelets were normal at 248, and the CRP was also normal at 2.06. The report from the CT scan of the head and orbits with contrast reports questioning a slight asymmetric enlargement of the distal left optic nerve. It was otherwise normal.    Today the patient reports that he saw a flicker in his left eye.  He went to see Dr. Dellie Burns on October 02, 2017.  He was found to have NLP vision in the left eye and Dr. Dellie Burns noted swelling of his left optic disc.  His labs were normal that day.  He has never had headache or scalp tenderness.  He has had a pinched nerve in his neck for 4 years.  He has had some pain in the left ear that extends down the side of his face toward his neck.  His jaw can get tired but he doesn't have pain with this.  No real consistent weight loss.  No PMR symptoms.      The vision has not really improved or worsened since he was last seen by Dr. Dellie Burns.     Interval history (11/17/17): After I saw the patient I had him evaluated by  Dr. Gertie Baron on November 1 at which time a temporal artery biopsy was done which ultimately came back positive for giant cell arteritis. These results were communicated to me and I contacted the patient and started him on prednisone and omeprazole. I also requested a rheumatology consult. Today the patient reports     Interval history (01/22/18): When I last saw the Erik [name] I felt that he was stable on exam with a normal right eye and continued severe vision loss in his left eye.  He was planning to see the rheumatologist the following week and my hope is that he would be started on Actemra.  He was seen by Dr. Scarlette Calico on November 28, 2017 and she agreed with the plan to start Actemra.  She also recommended getting carotid Dopplers and echocardiogram.  Unfortunately after that visit in late December he had several episodes of melena and presented to the hospital where he was found to have a duodenal ulcer.  He was placed on a PPI drip and his hemoglobin stabilized.    Interval history (05/28/2018): When I last saw the patient he was stable.  He continued to have no vision in his left eye but his right eye remained he was not having any symptoms of giant cell arteritis.  I recommended continuing his Actemra and his taper of steroids.  Today he reports that his vision is stable.  He has had swelling in his hands and legs over a few weeks.  He continues to have poor vision in  the left eye which is unchanged.  He is no longer on steroids.  He denies headaches.  He sleeps well.  He is no longer bleeding.      Data reviewed:  I have reviewed the pathology report from October 23, 2017. This reports that the segment of temporal artery was positive for active temporal arteritis.    Assessment and plan:  [phone] cell    [phone] cell Lawson Fiscal)    Erik [name] has recently suffered severe vision loss in his left eye with associated optic nerve head swelling. This is in the context of biopsy proven GCA.  Fortunately he is stable on exam today with a normal right eye without any interval decrease in his vision or visual field.  His symptoms are under control. He has started on Actemra and the rheumatology team has tapered his steroids.  He has no new symptoms of GCA but does have significant swelling of his extremities of unclear significance.  I have recommended that he go to see his Rheumatologist to see if this could be from the Actemra.  I recommend that he follow-up here in 6 months.       Regarding his pseudophakia his right eye is seeing quite well. Regarding his bilateral dry macular degeneration this does not appear to be greatly affecting his visual functioning currently.      Brooks Sailors, M.D.

## 2018-05-28 ENCOUNTER — Encounter: Admit: 2018-05-28 | Discharge: 2018-05-29 | Payer: MEDICARE

## 2018-05-28 DIAGNOSIS — M316 Other giant cell arteritis: Secondary | ICD-10-CM

## 2018-05-28 DIAGNOSIS — H47019 Ischemic optic neuropathy, unspecified eye: Principal | ICD-10-CM

## 2018-05-28 DIAGNOSIS — H47292 Other optic atrophy, left eye: Secondary | ICD-10-CM

## 2018-06-04 NOTE — Unmapped (Signed)
Erik [name] needs a refill on:    oxyCODONE-acetaminophen (PERCOCET) 5-325 mg per tab    He's running real low  Please send to [facility]

## 2018-06-05 NOTE — Unmapped (Signed)
P/C requesting refill on Oxycodone , last ordered on 05/07/18, last visit 04/03/18. Controlled Substance Contract signed on??04/11/17. UDS obtained on??04/11/17??and was appropriate

## 2018-06-06 MED ORDER — OXYCODONE-ACETAMINOPHEN 5 MG-325 MG TABLET
ORAL_TABLET | ORAL | 0 refills | 0.00000 days | Status: CP | PRN
Start: 2018-06-06 — End: 2018-07-03

## 2018-07-03 ENCOUNTER — Encounter: Admit: 2018-07-03 | Discharge: 2018-07-04 | Payer: MEDICARE

## 2018-07-03 DIAGNOSIS — D649 Anemia, unspecified: Secondary | ICD-10-CM

## 2018-07-03 DIAGNOSIS — M316 Other giant cell arteritis: Secondary | ICD-10-CM

## 2018-07-03 DIAGNOSIS — M5440 Lumbago with sciatica, unspecified side: Secondary | ICD-10-CM

## 2018-07-03 DIAGNOSIS — I10 Essential (primary) hypertension: Principal | ICD-10-CM

## 2018-07-03 DIAGNOSIS — G8929 Other chronic pain: Secondary | ICD-10-CM

## 2018-07-03 LAB — ERYTHROCYTE SEDIMENTATION RATE: Lab: 1

## 2018-07-03 LAB — COMPREHENSIVE METABOLIC PANEL
ALBUMIN: 4.4 g/dL (ref 3.5–5.0)
ALT (SGPT): 26 U/L (ref 19–72)
ANION GAP: 10 mmol/L (ref 9–15)
AST (SGOT): 28 U/L (ref 19–55)
BILIRUBIN TOTAL: 0.4 mg/dL (ref 0.0–1.2)
BLOOD UREA NITROGEN: 17 mg/dL (ref 7–21)
BUN / CREAT RATIO: 22
CALCIUM: 10.1 mg/dL (ref 8.5–10.2)
CHLORIDE: 95 mmol/L — ABNORMAL LOW (ref 98–107)
CO2: 26 mmol/L (ref 22.0–30.0)
CREATININE: 0.78 mg/dL (ref 0.70–1.30)
EGFR CKD-EPI AA MALE: >90 mL/min/{1.73_m2} (ref >=60)
EGFR CKD-EPI NON-AA MALE: 84 mL/min/{1.73_m2} (ref >=60)
GLUCOSE RANDOM: 101 mg/dL (ref 65–179)
POTASSIUM: 4.1 mmol/L (ref 3.5–5.0)
PROTEIN TOTAL: 6.9 g/dL (ref 6.5–8.3)

## 2018-07-03 LAB — CBC
HEMATOCRIT: 40 % — ABNORMAL LOW (ref 41.0–53.0)
HEMOGLOBIN: 13.4 g/dL — ABNORMAL LOW (ref 13.5–17.5)
MEAN CORPUSCULAR HEMOGLOBIN CONC: 33.5 g/dL (ref 31.0–37.0)
MEAN CORPUSCULAR VOLUME: 100.6 fL — ABNORMAL HIGH (ref 80.0–100.0)
MEAN PLATELET VOLUME: 8.8 fL (ref 7.0–10.0)
PLATELET COUNT: 191 10*9/L (ref 150–440)
RED BLOOD CELL COUNT: 3.98 10*12/L — ABNORMAL LOW (ref 4.50–5.90)
RED CELL DISTRIBUTION WIDTH: 13 % (ref 12.0–15.0)
WBC ADJUSTED: 6.1 10*9/L (ref 4.5–11.0)

## 2018-07-03 LAB — HEMOGLOBIN: Lab: 13.4 — ABNORMAL LOW

## 2018-07-03 LAB — AST (SGOT): Aspartate aminotransferase:CCnc:Pt:Ser/Plas:Qn:: 28

## 2018-07-03 LAB — C-REACTIVE PROTEIN: C reactive protein:MCnc:Pt:Ser/Plas:Qn:: <5

## 2018-07-03 MED ORDER — AMLODIPINE 10 MG TABLET
ORAL_TABLET | Freq: Every day | ORAL | 3 refills | 0 days | Status: CP
Start: 2018-07-03 — End: 2019-05-05

## 2018-07-03 MED ORDER — OXYCODONE-ACETAMINOPHEN 5 MG-325 MG TABLET
ORAL_TABLET | ORAL | 0 refills | 0.00000 days | Status: CP | PRN
Start: 2018-07-03 — End: 2018-07-31

## 2018-07-03 NOTE — Unmapped (Signed)
Name:  Erik [name]  DOB: 1934-01-27  Today's Date: 07/03/2018  Age:  84 y.o.    Assessment and Plan:     Zamari was seen today for hypertension.    Diagnoses and all orders for this visit:    Anemia, unspecified type  -     CBC    Essential hypertension, benign  -     amLODIPine (NORVASC) 10 MG tablet; Take 1 tablet (10 mg total) by mouth daily.  -     Comprehensive Metabolic Panel    Chronic bilateral low back pain with sciatica, sciatica laterality unspecified  -     oxyCODONE-acetaminophen (PERCOCET) 5-325 mg per tablet; Take 1 tablet by mouth every four (4) hours as needed for pain.    Giant cell arteritis (CMS-HCC)  -     Sedimentation Rate  -     C-reactive protein             HPI:      Erik [name]  is here for   Chief Complaint   Patient presents with   ??? Hypertension     f/u       He has no new complaints. He is followed by ophthalmology for his giant cell arteritis, and is currently on ACTEMRA and off of PREDNISONE.  He was treated for COPD exacerbation in the ED in April, and his COPD symptoms have been stable since he recovered.     ROS:      Review of Systems    REVIEW OF SYSTEMS:    Constitutional: No significant weight loss. No significant weight gain. No fever, chills.  HEAD/FACE: No complaints.  EYES: No vision in left eye.  ENTM: No complaints regarding ears. No complaints regarding nose. No complaints regarding throat. No complaints regarding mouth.  Respiratory: See HPI. Dyspnea only with moderate to marked exertion. No prolonged cough.  Cardiovascular: No exertional chest pain. No palpitations. Gastrointestinal: No abdominal pain. No constipation. No diarrhea. No bright red blood in bowel movements.   Skin: No complaints.  Musculoskeletal: Chronic back pain.  Genitourinary: No urinary frequency. No dysuria. No flank pain.  Neurological: No severe or recurrent headache. No focal neurological symptoms.  Hematologic/Lymphatic/Immunologic: No complaints.         Vital Signs:     Wt Readings from Last 3 Encounters:   07/03/18 62.6 kg (138 lb)   04/03/18 63.5 kg (140 lb)   02/04/18 59 kg (130 lb)     Temp Readings from Last 3 Encounters:   07/03/18 36.9 ??C (98.5 ??F) (Oral)   04/16/18 36.1 ??C (97 ??F) (Oral)   04/03/18 36.9 ??C (98.4 ??F) (Oral)     BP Readings from Last 3 Encounters:   07/03/18 142/54   04/16/18 131/61   04/03/18 136/50     Pulse Readings from Last 3 Encounters:   07/03/18 63   04/03/18 65   02/04/18 71     Estimated body mass index is 26.95 kg/m?? as calculated from the following:    Height as of this encounter: 152.4 cm (5').    Weight as of this encounter: 62.6 kg (138 lb).  Facility age limit for growth percentiles is 20 years.        Objective:      Physical Exam    PHYSICAL EXAM:     CONSTITUTIONAL: Alert, oriented, in no apparent distress.  LYMPHATIC:  No lymphadenopathy.  CARDIOVASCULAR: Heart: Regular rhythm. No S3.LUNGS: Clear to auscultation.  GASTROINTESTINAL: Abdomen soft.  Non-distended. Non-tender. No masses or organomegaly. Bowel sounds normal. No bruit heard.  MUSCULOSKELETAL: No clubbing. No edema.  SKIN:  No rash.   NEURO: Alert, oriented. Speech normal.   PSYCH: Affect normal. Normal eye contact.  HEME/LYMPH/IMMUNE:  No abnormalities noted.            Barriers to goals identified and addressed. Pertinent handouts were given today and reviewed with the patient as indicated.  The Care Plan and Self-Management goals have been included on the AVS and the AVS has been printed.  Any outside resources or referrals needed at this time are noted above. Patient's current medications have been reviewed.  Patient voiced understanding and all questions have been answered to satisfaction.   Medical records including recent encounters, pertinent imaging, and pertinent labs reviewed.      Past Medical/Surgical History:     Past Medical History:   Diagnosis Date   ??? Cataract 2005    cataract surgery   ??? COPD (chronic obstructive pulmonary disease) (CMS-HCC) 10/07/2016   ??? DJD (degenerative joint disease), lumbar    ??? Hypertension    ??? Tobacco abuse 02/15/2014     Past Surgical History:   Procedure Laterality Date   ??? CARDIAC VALVE REPLACEMENT      triple bypass   ??? CATARACT EXTRACTION     ??? CORONARY ARTERY BYPASS GRAFT  2001    x3   ??? EYE SURGERY  2005    cataracts   ??? PR REPAIR ING HERNIA,5+Y/O,REDUCIBL Left 06/19/2015    Procedure: REPAIR INITIAL INGUINAL HERNIA, AGE 58 YEARS OR OLDER; REDUCIBLE;  Surgeon: Shelda Jakes, MD;  Location: [facility] OR [facility];  Service: Trauma   ??? PR TEMPORAL ARTERY LIGATN OR BX Left 10/23/2017    Procedure: LIGATION OR BIOPSY, TEMPORAL ARTERY;  Surgeon: Vivien Rossetti, MD;  Location: [facility] OR [facility];  Service: ENT   ??? PR UPPER GI ENDOSCOPY,DIAGNOSIS N/A 12/17/2017    Procedure: UGI ENDO, INCLUDE ESOPHAGUS, STOMACH, & DUODENUM &/OR JEJUNUM; DX W/WO COLLECTION SPECIMN, BY BRUSH OR WASH;  Surgeon: Bluford Kaufmann, MD;  Location: [facility];  Service: Gastroenterology         Family History:     Family History   Problem Relation Age of Onset   ??? Thyroid disease Sister    ??? Cancer Brother    ??? Glaucoma Neg Hx    ??? Macular degeneration Neg Hx    ??? Retinal detachment Neg Hx        Social History:     Social History     Socioeconomic History   ??? Marital status: Widowed     Spouse name: None   ??? Number of children: None   ??? Years of education: None   ??? Highest education level: None   Occupational History   ??? None   Social Needs   ??? Financial resource strain: None   ??? Food insecurity:     Worry: None     Inability: None   ??? Transportation needs:     Medical: None     Non-medical: None   Tobacco Use   ??? Smoking status: Current Every Day Smoker     Packs/day: 0.50     Years: 4.00     Pack years: 2.00   ??? Smokeless tobacco: Never Used   ??? Tobacco comment: quit for 25 years, restarted 2014   Substance and Sexual Activity   ??? Alcohol use: Yes     Alcohol/week:  1.2 oz     Types: 2 Cans of beer per week     Frequency: 4 or more times a week     Drinks per session: 1 or 2     Binge frequency: Never     Comment: daily   ??? Drug use: No   ??? Sexual activity: None   Lifestyle   ??? Physical activity:     Days per week: None     Minutes per session: None   ??? Stress: None   Relationships   ??? Social connections:     Talks on phone: None     Gets together: None     Attends religious service: None     Active member of club or organization: None     Attends meetings of clubs or organizations: None     Relationship status: None   Other Topics Concern   ??? Do you use sunscreen? No   ??? Tanning bed use? No   ??? Are you easily burned? No   ??? Excessive sun exposure? No   ??? Blistering sunburns? Yes     Comment: Past   Social History Narrative    Lives in [place], wife passed 7 years ago. His daughter lives with him. Granddaughter, Victorino Dike, manages his finances.          Allergies:     Patient has no known allergies.    Current Medications:     Current Outpatient Medications   Medication Sig Dispense Refill   ??? amLODIPine (NORVASC) 10 MG tablet Take 1 tablet (10 mg total) by mouth daily. 90 tablet 3   ??? ascorbic acid (VITAMIN C) 500 MG tablet Take 500 mg by mouth daily.     ??? aspirin (ECOTRIN) 81 MG tablet Take 81 mg by mouth daily.     ??? atenolol (TENORMIN) 50 MG tablet Take 1 tablet (50 mg total) by mouth Two (2) times a day. 180 tablet 3   ??? cyanocobalamin (VITAMIN B-12) 1000 MCG tablet Take 1,000 mcg by mouth daily.     ??? hydrALAZINE (APRESOLINE) 25 MG tablet Take 1 tablet (25 mg total) by mouth daily. 90 tablet 11   ??? losartan (COZAAR) 100 MG tablet Take 1 tablet (100 mg total) by mouth daily. 90 tablet 3   ??? multivitamin (TAB-A-VITE/THERAGRAN) per tablet Take 1 tablet by mouth daily.     ??? oxyCODONE-acetaminophen (PERCOCET) 5-325 mg per tablet Take 1 tablet by mouth every four (4) hours as needed for pain. 120 tablet 0   ??? simvastatin (ZOCOR) 10 MG tablet Take 1 tablet (10 mg total) by mouth daily. 90 tablet 1   ??? tocilizumab (ACTEMRA) 162 mg/0.9 mL Syrg subcutaneous injection Inject 162 mg under the skin every seven (7) days.     ??? triamcinolone (KENALOG) 0.1 % cream Apply topically Two (2) times a day. 180 g 1     No current facility-administered medications for this visit.          POCT:     Results for orders placed or performed in visit on 01/02/18   H. pylori antigen, stool   Result Value Ref Range    H. pylori Antigen Stool Negative Negative   CBC   Result Value Ref Range    WBC 16.6 (H) 4.5 - 11.0 10*9/L    RBC 3.03 (L) 4.50 - 5.90 10*12/L    HGB 10.5 (L) 13.5 - 17.5 g/dL    HCT 36.6 (L) 44.0 - 53.0 %  MCV 107.4 (H) 80.0 - 100.0 fL    MCH 34.5 (H) 26.0 - 34.0 pg    MCHC 32.1 31.0 - 37.0 g/dL    RDW 13.0 86.5 - 78.4 %    MPV 8.4 7.0 - 10.0 fL    Platelet 412 150 - 440 10*9/L         Evanna Washinton Dorthula Matas, MD

## 2018-07-31 NOTE — Unmapped (Signed)
P/C requesting refill on Percocet , last ordered on 07/03/18, last visit 07/03/18. Controlled Substance Contract signed on??04/11/17. UDS obtained on??04/11/17??and was appropriate

## 2018-08-02 MED ORDER — OXYCODONE-ACETAMINOPHEN 5 MG-325 MG TABLET
ORAL_TABLET | ORAL | 0 refills | 0 days | Status: CP | PRN
Start: 2018-08-02 — End: 2018-08-28

## 2018-08-28 NOTE — Unmapped (Signed)
P/C requesting refill on Percocet , last ordered on 08/02/18, last visit 07/03/18. Controlled Substance Contract signed on??04/11/17. UDS obtained on??04/11/17??and was appropriate.

## 2018-09-01 MED ORDER — OXYCODONE-ACETAMINOPHEN 5 MG-325 MG TABLET
ORAL_TABLET | ORAL | 0 refills | 0 days | Status: CP | PRN
Start: 2018-09-01 — End: 2018-09-29

## 2018-09-29 NOTE — Unmapped (Signed)
P/C requesting refill on Percocet , last ordered on 09/01/18, last visit 07/03/18. Contract signed on??04/11/17. UDS obtained on??04/11/17??and was appropriate.

## 2018-10-01 MED ORDER — OXYCODONE-ACETAMINOPHEN 5 MG-325 MG TABLET
ORAL_TABLET | ORAL | 0 refills | 0.00000 days | Status: CP | PRN
Start: 2018-10-01 — End: 2018-10-29

## 2018-10-29 MED ORDER — SIMVASTATIN 10 MG TABLET
ORAL_TABLET | Freq: Every day | ORAL | 1 refills | 0 days | Status: CP
Start: 2018-10-29 — End: 2019-04-22

## 2018-10-29 NOTE — Unmapped (Signed)
P/C requesting refill on Percocet , last ordered on 10/01/18, last visit 07/03/18. Controlled Substance Contract signed on??04/11/17. UDS obtained on??04/11/17??and was appropriate.

## 2018-10-31 MED ORDER — OXYCODONE-ACETAMINOPHEN 5 MG-325 MG TABLET
ORAL_TABLET | ORAL | 0 refills | 0 days | Status: CP | PRN
Start: 2018-10-31 — End: 2018-11-26

## 2018-11-05 ENCOUNTER — Encounter: Admit: 2018-11-05 | Discharge: 2018-11-06 | Payer: MEDICARE

## 2018-11-05 DIAGNOSIS — D649 Anemia, unspecified: Principal | ICD-10-CM

## 2018-11-05 DIAGNOSIS — I10 Essential (primary) hypertension: Secondary | ICD-10-CM

## 2018-11-05 NOTE — Unmapped (Signed)
Name:  Erik [name]  DOB: 1934/06/19  Today's Date: 11/05/2018  Age:  84 y.o.    Assessment and Plan:     [name] was seen today for anemia.    Diagnoses and all orders for this visit:    Anemia, unspecified type  -     CBC; Future    Essential hypertension, benign    Other orders  -     INFLUENZA VACCINE (QUAD) IM - 6 MO-ADULT - PF         We will recheck his left paratracheal area in one month (sooner if symptoms worsen). Consider imaging if it persists or worsens.    HPI:      Erik [name]  is here for   Chief Complaint   Patient presents with   ??? Anemia     f/u       He is here for follow-up. He is being followed by rheumatology for giant cell arteritis, and is being treated with ACTEMRA.  He has no new complaints. He has noticed that one his his glands (on the left side of his neck) feels larger than the other (on the right). No pain. No oral symptoms. No throat symptoms.  To me, he denies dyspnea at rest and with moderate exertion. He walks and lifts some weights every day.       ROS:      Review of Systems    REVIEW OF SYSTEMS:    Constitutional: No significant weight loss reported, but he has lost about 15 lbs in the past year. No significant weight gain. No fever, chills.  HEAD/FACE: No complaints.  EYES: Left eye blind.  ENTM: No complaints regarding ears. No complaints regarding nose. No complaints regarding throat. No complaints regarding mouth.  Respiratory: No shortness of breath. No prolonged cough.  Cardiovascular: No exertional chest pain. No palpitations. No edema.  Gastrointestinal: No abdominal pain. No constipation. No diarrhea. No bright red blood in bowel movements.   Skin: No complaints.  Musculoskeletal: Chronic back pain, controlled somewhat on opioid therapy, which he uses appropriately.  Genitourinary: No urinary frequency. No dysuria. No flank pain.  Neurological: Chronic back pain.  Hematologic/Lymphatic/Immunologic: No complaints.         Vital Signs:     Wt Readings from Last 3 Encounters:   11/05/18 59.4 kg (131 lb)   07/03/18 62.6 kg (138 lb)   04/03/18 63.5 kg (140 lb)     Temp Readings from Last 3 Encounters:   11/05/18 36.9 ??C (98.5 ??F) (Oral)   07/03/18 36.9 ??C (98.5 ??F) (Oral)   04/16/18 36.1 ??C (97 ??F) (Oral)     BP Readings from Last 3 Encounters:   11/05/18 132/56   07/03/18 142/54   04/16/18 131/61     Pulse Readings from Last 3 Encounters:   11/05/18 62   07/03/18 63   04/03/18 65     Estimated body mass index is 25.58 kg/m?? as calculated from the following:    Height as of this encounter: 152.4 cm (5').    Weight as of this encounter: 59.4 kg (131 lb).  Facility age limit for growth percentiles is 20 years.        Objective:      Physical Exam    PHYSICAL EXAM:     CONSTITUTIONAL: Alert, oriented, in no apparent distress.  ENT: Ears, nose, throat clear.    EYES: No gross abnormalities.   NECK: There is a slight enlargement left upper  paratracheal soft tissue compared to the right. No definite dominant mass, fluctuance, or tenderness. The area is soft and mobile to palpation. No thyromegaly.   LYMPHATIC:  No  Axillary or supraclavicular or infraclavicular lymphadenopathy.  CARDIOVASCULAR: Heart: Regular rhythm. No S3.  LUNGS: Clear to auscultation.  GASTROINTESTINAL: Abdomen soft. Non-distended. Non-tender. No masses or organomegaly. Bowel sounds normal.   MUSCULOSKELETAL: No clubbing. No edema.  SKIN:  No rash.   NEURO: Alert, oriented. Speech normal.   PSYCH: Affect normal. Normal eye contact.  HEME/LYMPH/IMMUNE:  No abnormalities noted.        25 minutes were spent with patient. At least 50% of time was spent in counseling and coordination of care.    I have reviewed and addressed the patient???s adherence and response to prescribed medications. I have identified patient barriers to following the proposed medication and treatment plan, and have noted opportunities to optimize healthy behaviors. I have answered the patient???s questions to satisfaction and the patient voices understanding.             Past Medical/Surgical History:     Past Medical History:   Diagnosis Date   ??? Cataract 2005    cataract surgery   ??? COPD (chronic obstructive pulmonary disease) (CMS-HCC) 10/07/2016   ??? DJD (degenerative joint disease), lumbar    ??? Hypertension    ??? Tobacco abuse 02/15/2014     Past Surgical History:   Procedure Laterality Date   ??? CARDIAC VALVE REPLACEMENT      triple bypass   ??? CATARACT EXTRACTION     ??? CORONARY ARTERY BYPASS GRAFT  2001    x3   ??? EYE SURGERY  2005    cataracts   ??? PR REPAIR ING HERNIA,5+Y/O,REDUCIBL Left 06/19/2015    Procedure: REPAIR INITIAL INGUINAL HERNIA, AGE 13 YEARS OR OLDER; REDUCIBLE;  Surgeon: Shelda Jakes, MD;  [facility];  Service: Trauma   ??? PR TEMPORAL ARTERY LIGATN OR BX Left 10/23/2017    Procedure: LIGATION OR BIOPSY, TEMPORAL ARTERY;  Surgeon: Vivien Rossetti, MD;  [facility];  Service: ENT   ??? PR UPPER GI ENDOSCOPY,DIAGNOSIS N/A 12/17/2017    Procedure: UGI ENDO, INCLUDE ESOPHAGUS, STOMACH, & DUODENUM &/OR JEJUNUM; DX W/WO COLLECTION SPECIMN, BY BRUSH OR WASH;  Surgeon: Bluford Kaufmann, MD;  Location: [facility];  Service: Gastroenterology         Family History:     Family History   Problem Relation Age of Onset   ??? Thyroid disease Sister    ??? Cancer Brother    ??? Glaucoma Neg Hx    ??? Macular degeneration Neg Hx    ??? Retinal detachment Neg Hx    ??? Substance Abuse Disorder Neg Hx    ??? Mental illness Neg Hx        Social History:     Social History     Socioeconomic History   ??? Marital status: Widowed     Spouse name: None   ??? Number of children: None   ??? Years of education: None   ??? Highest education level: None   Occupational History   ??? None   Social Needs   ??? Financial resource strain: None   ??? Food insecurity:     Worry: None     Inability: None   ??? Transportation needs:     Medical: None     Non-medical: None   Tobacco Use   ??? Smoking status:  Current Every Day Smoker     Packs/day: 0.50 Years: 4.00     Pack years: 2.00   ??? Smokeless tobacco: Never Used   ??? Tobacco comment: quit for 25 years, restarted 2014   Substance and Sexual Activity   ??? Alcohol use: Yes     Alcohol/week: 2.0 standard drinks     Types: 2 Cans of beer per week     Frequency: 4 or more times a week     Drinks per session: 1 or 2     Binge frequency: Never     Comment: daily   ??? Drug use: No   ??? Sexual activity: None   Lifestyle   ??? Physical activity:     Days per week: None     Minutes per session: None   ??? Stress: None   Relationships   ??? Social connections:     Talks on phone: None     Gets together: None     Attends religious service: None     Active member of club or organization: None     Attends meetings of clubs or organizations: None     Relationship status: None   Other Topics Concern   ??? Do you use sunscreen? No   ??? Tanning bed use? No   ??? Are you easily burned? No   ??? Excessive sun exposure? No   ??? Blistering sunburns? Yes     Comment: Past   Social History Narrative    Lives in [place], wife passed 7 years ago. His daughter lives with him. Granddaughter, Victorino Dike, manages his finances.          Allergies:     Patient has no known allergies.    Current Medications:     Current Outpatient Medications   Medication Sig Dispense Refill   ??? amLODIPine (NORVASC) 10 MG tablet Take 1 tablet (10 mg total) by mouth daily. 90 tablet 3   ??? ascorbic acid (VITAMIN C) 500 MG tablet Take 500 mg by mouth daily.     ??? aspirin (ECOTRIN) 81 MG tablet Take 81 mg by mouth daily.     ??? atenolol (TENORMIN) 50 MG tablet Take 1 tablet (50 mg total) by mouth Two (2) times a day. 180 tablet 3   ??? cyanocobalamin (VITAMIN B-12) 1000 MCG tablet Take 1,000 mcg by mouth daily.     ??? hydrALAZINE (APRESOLINE) 25 MG tablet Take 1 tablet (25 mg total) by mouth daily. 90 tablet 11   ??? lansoprazole (PREVACID) 30 MG capsule TEST 60 each 0   ??? losartan (COZAAR) 100 MG tablet Take 1 tablet (100 mg total) by mouth daily. 90 tablet 3   ??? multivitamin (TAB-A-VITE/THERAGRAN) per tablet Take 1 tablet by mouth daily.     ??? oxyCODONE-acetaminophen (PERCOCET) 5-325 mg per tablet Take 1 tablet by mouth every four (4) hours as needed for pain. 120 tablet 0   ??? simvastatin (ZOCOR) 10 MG tablet Take 1 tablet (10 mg total) by mouth daily. 90 tablet 1   ??? tocilizumab (ACTEMRA) 162 mg/0.9 mL Syrg subcutaneous injection Inject 162 mg under the skin every seven (7) days.     ??? triamcinolone (KENALOG) 0.1 % cream Apply topically Two (2) times a day. 180 g 1     No current facility-administered medications for this visit.          POCT:     Results for orders placed or performed in visit on 07/03/18   CBC  Result Value Ref Range    WBC 6.1 4.5 - 11.0 10*9/L    RBC 3.98 (L) 4.50 - 5.90 10*12/L    HGB 13.4 (L) 13.5 - 17.5 g/dL    HCT 16.1 (L) 09.6 - 53.0 %    MCV 100.6 (H) 80.0 - 100.0 fL    MCH 33.7 26.0 - 34.0 pg    MCHC 33.5 31.0 - 37.0 g/dL    RDW 04.5 40.9 - 81.1 %    MPV 8.8 7.0 - 10.0 fL    Platelet 191 150 - 440 10*9/L   Comprehensive Metabolic Panel   Result Value Ref Range    Sodium 131 (L) 135 - 145 mmol/L    Potassium 4.1 3.5 - 5.0 mmol/L    Chloride 95 (L) 98 - 107 mmol/L    CO2 26.0 22.0 - 30.0 mmol/L    Anion Gap 10 9 - 15 mmol/L    BUN 17 7 - 21 mg/dL    Creatinine 9.14 7.82 - 1.30 mg/dL    BUN/Creatinine Ratio 22     EGFR CKD-EPI Non-African American, Male 41 >=60 mL/min/1.64m2    EGFR CKD-EPI African American, Male >90 >=60 mL/min/1.49m2    Glucose 101 65 - 179 mg/dL    Calcium 95.6 8.5 - 21.3 mg/dL    Albumin 4.4 3.5 - 5.0 g/dL    Total Protein 6.9 6.5 - 8.3 g/dL    Total Bilirubin 0.4 0.0 - 1.2 mg/dL    AST 28 19 - 55 U/L    ALT 26 19 - 72 U/L    Alkaline Phosphatase 52 38 - 126 U/L   Sedimentation Rate   Result Value Ref Range    Sed Rate 1 0 - 20 mm/h   C-reactive protein   Result Value Ref Range    CRP <5.0 <10.0 mg/L         Lewellyn Fultz Dorthula Matas, MD

## 2018-11-26 MED ORDER — TOCILIZUMAB 162 MG/0.9 ML SUBCUTANEOUS SYRINGE
SUBCUTANEOUS | 3 refills | 0 days | Status: CP
Start: 2018-11-26 — End: 2018-11-27

## 2018-11-26 NOTE — Unmapped (Signed)
Tocilizumab (Actemra) refill  Last ov: 02/04/2018  Next ov: 04/14/2019  Labs:   AST   Date Value Ref Range Status   07/03/2018 28 19 - 55 U/L Final     ALT   Date Value Ref Range Status   07/03/2018 26 19 - 72 U/L Final     Creatinine Whole Blood, POC   Date Value Ref Range Status   10/11/2017 0.8 0.8 - 1.4 mg/dL Final     Creatinine   Date Value Ref Range Status   07/03/2018 0.78 0.70 - 1.30 mg/dL Final     WBC   Date Value Ref Range Status   07/03/2018 6.1 4.5 - 11.0 10*9/L Final     HGB   Date Value Ref Range Status   07/03/2018 13.4 (L) 13.5 - 17.5 g/dL Final     HCT   Date Value Ref Range Status   07/03/2018 40.0 (L) 41.0 - 53.0 % Final     MCV   Date Value Ref Range Status   07/03/2018 100.6 (H) 80.0 - 100.0 fL Final     RDW   Date Value Ref Range Status   07/03/2018 13.0 12.0 - 15.0 % Final     Platelet   Date Value Ref Range Status   07/03/2018 191 150 - 440 10*9/L Final

## 2018-11-27 MED ORDER — TOCILIZUMAB 162 MG/0.9 ML SUBCUTANEOUS SYRINGE
SUBCUTANEOUS | 3 refills | 0 days | Status: CP
Start: 2018-11-27 — End: ?

## 2018-11-27 NOTE — Unmapped (Signed)
Staff message

## 2018-11-27 NOTE — Unmapped (Signed)
P/C requesting refill on Oxycodone , last ordered on 10/31/18, last visit 11/05/18.Controlled Substance Contract signed on??04/11/17. UDS obtained on??04/11/17??and was appropriate.

## 2018-11-28 NOTE — Unmapped (Signed)
Called pt to gather more information. Pt states he receives his Actemra for $0 through a pharmacy assistance program. Pt states he currently has no refills left and was told he would have $1100 copay for medication. Pt states he currently has three shots left. Pt receives medication from [facility](phone number [phone], address: [address] [place], [place] [address]). Please advise.

## 2018-11-30 MED ORDER — OXYCODONE-ACETAMINOPHEN 5 MG-325 MG TABLET
ORAL_TABLET | ORAL | 0 refills | 0 days | Status: CP | PRN
Start: 2018-11-30 — End: 2018-12-29

## 2018-12-29 MED ORDER — OXYCODONE-ACETAMINOPHEN 5 MG-325 MG TABLET
ORAL_TABLET | ORAL | 0 refills | 0 days | Status: CP | PRN
Start: 2018-12-29 — End: 2019-01-27

## 2018-12-29 NOTE — Unmapped (Signed)
P/C requesting refill on Percocet , last ordered on 11/30/18, last visit 11/05/18.Controlled Substance Contract signed on??04/11/17. UDS obtained on??04/11/17??and was appropriate

## 2019-01-01 ENCOUNTER — Encounter: Admit: 2019-01-01 | Discharge: 2019-01-02 | Payer: MEDICARE

## 2019-01-01 DIAGNOSIS — I25810 Atherosclerosis of coronary artery bypass graft(s) without angina pectoris: Secondary | ICD-10-CM

## 2019-01-01 DIAGNOSIS — M316 Other giant cell arteritis: Principal | ICD-10-CM

## 2019-01-01 DIAGNOSIS — I10 Essential (primary) hypertension: Secondary | ICD-10-CM

## 2019-01-01 DIAGNOSIS — J449 Chronic obstructive pulmonary disease, unspecified: Secondary | ICD-10-CM

## 2019-01-01 NOTE — Unmapped (Signed)
Name:  Erik [name]  DOB: August 25, 1934  Today's Date: 01/01/2019  Age:  85 y.o.    Assessment and Plan:     Erik [name] was seen today for hypertension and anemia.    Diagnoses and all orders for this visit:    Essential hypertension, benign    Giant cell arteritis (CMS-HCC)    Chronic obstructive pulmonary disease, unspecified COPD type (CMS-HCC)    Coronary artery disease involving autologous vein coronary bypass graft without angina pectoris             HPI:      Erik [name]  is here for   Chief Complaint   Patient presents with   ??? Hypertension     f/u   ??? Anemia     f/u       He is here for follow-up.  He is followed by ophthalmology and rheumatology for his giant cell arteritis. He has no new symptoms regarding this (vision has not returned. No headaches).  His COPD is stable. He states that his dyspnea on exertion does not limit his activity (back pain does). He declines use of any inhalers, despite my explanation of the benefits of them in improving dyspnea, preventing hospitalization, and improving long-term lung function.  He is having no chest pain or palpitations or other symptoms of his coronary disease.  His hypertension is controlled. He checks his BP frequently at home, and it is documented to be in the 120-135/80s range consistently.  His previously reported swollen neck gland has resolved.       ROS:      Review of Systems    REVIEW OF SYSTEMS:    Constitutional: No significant weight loss. No significant weight gain. No fever, chills.  HEAD/FACE: No complaints.  EYES: See HPI.  ENTM: No complaints regarding ears. No complaints regarding nose. No complaints regarding throat. No complaints regarding mouth.  Respiratory: See HPI. He continues to smoke (counseled regarding dangers and expected consequences, but he is not interested in stopping).  Cardiovascular: No exertional chest pain. No palpitations. No edema.  Gastrointestinal: No abdominal pain. No constipation. No diarrhea. No bright red blood in bowel movements.   Skin: No complaints.  Musculoskeletal: No complaints not listed in HPI. Chronic back pain.  Genitourinary: No urinary frequency. No dysuria. No flank pain.  Neurological: No severe or recurrent headache. No focal neurological symptoms.  Hematologic/Lymphatic/Immunologic: No complaints.         Vital Signs:     Wt Readings from Last 3 Encounters:   01/01/19 60.8 kg (134 lb)   11/05/18 59.4 kg (131 lb)   07/03/18 62.6 kg (138 lb)     Temp Readings from Last 3 Encounters:   01/01/19 36.8 ??C (98.2 ??F) (Oral)   11/05/18 36.9 ??C (98.5 ??F) (Oral)   07/03/18 36.9 ??C (98.5 ??F) (Oral)     BP Readings from Last 3 Encounters:   01/01/19 138/60   11/05/18 132/56   07/03/18 142/54     Pulse Readings from Last 3 Encounters:   01/01/19 55   11/05/18 62   07/03/18 63     Estimated body mass index is 26.17 kg/m?? as calculated from the following:    Height as of this encounter: 152.4 cm (5').    Weight as of this encounter: 60.8 kg (134 lb).  Facility age limit for growth percentiles is 20 years.        Objective:      Physical Exam  PHYSICAL EXAM:     CONSTITUTIONAL: Alert, oriented, in no apparent distress.  ENT: Ears, nose, throat clear.    EYES: No gross abnormalities.  NECK: No masses. No thyromegaly. No lymphadenopathy. No bruits.  LYMPHATIC:  No lymphadenopathy.  CARDIOVASCULAR: Heart: Regular rhythm. No S3.  LUNGS: Clear to auscultation except for scattered bilateral rhonchi.  GASTROINTESTINAL: Abdomen soft. Non-distended. Non-tender. No masses or organomegaly. Bowel sounds normal. No bruit heard.  MUSCULOSKELETAL: No clubbing. No edema.  SKIN:  No rash.   NEURO: Alert, oriented. Speech normal.   PSYCH: Affect normal. Normal eye contact.  HEME/LYMPH/IMMUNE:  No abnormalities noted.        25 minutes were spent with patient. At least 50% of time was spent in counseling and coordination of care.    I have reviewed and addressed the patient???s adherence and response to prescribed medications. I have identified patient barriers to following the proposed medication and treatment plan, and have noted opportunities to optimize healthy behaviors. I have answered the patient???s questions to satisfaction and the patient voices understanding.             Past Medical/Surgical History:     Past Medical History:   Diagnosis Date   ??? Cataract 2005    cataract surgery   ??? COPD (chronic obstructive pulmonary disease) (CMS-HCC) 10/07/2016   ??? DJD (degenerative joint disease), lumbar    ??? Hypertension    ??? Tobacco abuse 02/15/2014     Past Surgical History:   Procedure Laterality Date   ??? CARDIAC VALVE REPLACEMENT      triple bypass   ??? CATARACT EXTRACTION     ??? CORONARY ARTERY BYPASS GRAFT  2001    x3   ??? EYE SURGERY  2005    cataracts   ??? PR REPAIR ING HERNIA,5+Y/O,REDUCIBL Left 06/19/2015    Procedure: REPAIR INITIAL INGUINAL HERNIA, AGE 31 YEARS OR OLDER; REDUCIBLE;  Surgeon: Shelda Jakes, MD;  Location[facility] OR [facility];  Service: Trauma   ??? PR TEMPORAL ARTERY LIGATN OR BX Left 10/23/2017    Procedure: LIGATION OR BIOPSY, TEMPORAL ARTERY;  Surgeon: Vivien Rossetti, MD;  [facility];  Service: ENT   ??? PR UPPER GI ENDOSCOPY,DIAGNOSIS N/A 12/17/2017    Procedure: UGI ENDO, INCLUDE ESOPHAGUS, STOMACH, & DUODENUM &/OR JEJUNUM; DX W/WO COLLECTION SPECIMN, BY BRUSH OR WASH;  Surgeon: Bluford Kaufmann, MD;  [facility]: [facility];  Service: Gastroenterology         Family History:     Family History   Problem Relation Age of Onset   ??? Thyroid disease Sister    ??? Cancer Brother    ??? Glaucoma Neg Hx    ??? Macular degeneration Neg Hx    ??? Retinal detachment Neg Hx    ??? Substance Abuse Disorder Neg Hx    ??? Mental illness Neg Hx        Social History:     Social History     Socioeconomic History   ??? Marital status: Widowed     Spouse name: None   ??? Number of children: None   ??? Years of education: None   ??? Highest education level: None   Occupational History   ??? None   Social Needs   ??? Financial resource strain: None   ??? Food insecurity:     Worry: None     Inability: None   ??? Transportation needs:     Medical: None  Non-medical: None   Tobacco Use   ??? Smoking status: Current Every Day Smoker     Packs/day: 0.50     Years: 4.00     Pack years: 2.00   ??? Smokeless tobacco: Never Used   ??? Tobacco comment: quit for 25 years, restarted 2014   Substance and Sexual Activity   ??? Alcohol use: Yes     Alcohol/week: 2.0 standard drinks     Types: 2 Cans of beer per week     Frequency: 4 or more times a week     Drinks per session: 1 or 2     Binge frequency: Never     Comment: daily   ??? Drug use: No   ??? Sexual activity: None   Lifestyle   ??? Physical activity:     Days per week: None     Minutes per session: None   ??? Stress: None   Relationships   ??? Social connections:     Talks on phone: None     Gets together: None     Attends religious service: None     Active member of club or organization: None     Attends meetings of clubs or organizations: None     Relationship status: None   Other Topics Concern   ??? Do you use sunscreen? No   ??? Tanning bed use? No   ??? Are you easily burned? No   ??? Excessive sun exposure? No   ??? Blistering sunburns? Yes     Comment: Past   Social History Narrative    Lives in [place], wife passed 7 years ago. His daughter lives with him. Granddaughter, Victorino Dike, manages his finances.          Allergies:     Patient has no known allergies.    Current Medications:     Current Outpatient Medications   Medication Sig Dispense Refill   ??? amLODIPine (NORVASC) 10 MG tablet Take 1 tablet (10 mg total) by mouth daily. 90 tablet 3   ??? ascorbic acid (VITAMIN C) 500 MG tablet Take 500 mg by mouth daily.     ??? aspirin (ECOTRIN) 81 MG tablet Take 81 mg by mouth daily.     ??? atenolol (TENORMIN) 50 MG tablet Take 1 tablet (50 mg total) by mouth Two (2) times a day. 180 tablet 3   ??? cyanocobalamin (VITAMIN B-12) 1000 MCG tablet Take 1,000 mcg by mouth daily.     ??? hydrALAZINE (APRESOLINE) 25 MG tablet Take 1 tablet (25 mg total) by mouth daily. 90 tablet 11   ??? lansoprazole (PREVACID) 30 MG capsule TEST 60 each 0   ??? losartan (COZAAR) 100 MG tablet Take 1 tablet (100 mg total) by mouth daily. 90 tablet 3   ??? multivitamin (TAB-A-VITE/THERAGRAN) per tablet Take 1 tablet by mouth daily.     ??? oxyCODONE-acetaminophen (PERCOCET) 5-325 mg per tablet Take 1 tablet by mouth every four (4) hours as needed for pain. 120 tablet 0   ??? simvastatin (ZOCOR) 10 MG tablet Take 1 tablet (10 mg total) by mouth daily. 90 tablet 1   ??? tocilizumab (ACTEMRA) 162 mg/0.9 mL Syrg subcutaneous injection Inject 0.9 mL (162 mg total) under the skin every seven (7) days. 4 mL 3   ??? triamcinolone (KENALOG) 0.1 % cream Apply topically Two (2) times a day. 180 g 1     No current facility-administered medications for this visit.  POCT:     Results for orders placed or performed in visit on 07/03/18   CBC   Result Value Ref Range    WBC 6.1 4.5 - 11.0 10*9/L    RBC 3.98 (L) 4.50 - 5.90 10*12/L    HGB 13.4 (L) 13.5 - 17.5 g/dL    HCT 16.1 (L) 09.6 - 53.0 %    MCV 100.6 (H) 80.0 - 100.0 fL    MCH 33.7 26.0 - 34.0 pg    MCHC 33.5 31.0 - 37.0 g/dL    RDW 04.5 40.9 - 81.1 %    MPV 8.8 7.0 - 10.0 fL    Platelet 191 150 - 440 10*9/L   Comprehensive Metabolic Panel   Result Value Ref Range    Sodium 131 (L) 135 - 145 mmol/L    Potassium 4.1 3.5 - 5.0 mmol/L    Chloride 95 (L) 98 - 107 mmol/L    CO2 26.0 22.0 - 30.0 mmol/L    Anion Gap 10 9 - 15 mmol/L    BUN 17 7 - 21 mg/dL    Creatinine 9.14 7.82 - 1.30 mg/dL    BUN/Creatinine Ratio 22     EGFR CKD-EPI Non-African American, Male 39 >=60 mL/min/1.37m2    EGFR CKD-EPI African American, Male >90 >=60 mL/min/1.66m2    Glucose 101 65 - 179 mg/dL    Calcium 95.6 8.5 - 21.3 mg/dL    Albumin 4.4 3.5 - 5.0 g/dL    Total Protein 6.9 6.5 - 8.3 g/dL    Total Bilirubin 0.4 0.0 - 1.2 mg/dL    AST 28 19 - 55 U/L    ALT 26 19 - 72 U/L    Alkaline Phosphatase 52 38 - 126 U/L   Sedimentation Rate   Result Value Ref Range    Sed Rate 1 0 - 20 mm/h   C-reactive protein   Result Value Ref Range    CRP <5.0 <10.0 mg/L         Tyger Oka Dorthula Matas, MD

## 2019-01-28 MED ORDER — OXYCODONE-ACETAMINOPHEN 5 MG-325 MG TABLET
ORAL_TABLET | ORAL | 0 refills | 0.00000 days | Status: CP | PRN
Start: 2019-01-28 — End: 2019-02-24

## 2019-01-28 NOTE — Unmapped (Signed)
Pt is asking about an inhaler you talked with him about re: the phlegm in his throat. He would like to try it

## 2019-01-28 NOTE — Unmapped (Signed)
P/C requesting refill on  Percocet , last ordered on 12/29/2018, last visit 01/01/2019. UDS obtained on??04/11/17??and was appropriate

## 2019-02-24 DIAGNOSIS — G8929 Other chronic pain: Principal | ICD-10-CM

## 2019-02-24 DIAGNOSIS — M5440 Lumbago with sciatica, unspecified side: Principal | ICD-10-CM

## 2019-02-24 NOTE — Unmapped (Signed)
P/C requesting refill on Percocet , last ordered on 01/28/2019, last visit 01/01/2019. Contract signed 01/01/2019,UDS obtained on 04/11/17 and was appropriate.

## 2019-02-27 MED ORDER — OXYCODONE-ACETAMINOPHEN 5 MG-325 MG TABLET
ORAL_TABLET | ORAL | 0 refills | 0 days | Status: CP | PRN
Start: 2019-02-27 — End: 2019-03-25

## 2019-03-25 MED ORDER — OXYCODONE-ACETAMINOPHEN 5 MG-325 MG TABLET
ORAL_TABLET | ORAL | 0 refills | 0 days | Status: CP | PRN
Start: 2019-03-25 — End: 2019-04-22

## 2019-03-25 NOTE — Unmapped (Signed)
Voice mail received this am from patient requesting refill on Percocet , last ordered on 01/28/2019, last visit 01/01/2019. Contract signed 01/01/2019,UDS obtained on 04/11/17 and was appropriate. Request forwarded to Dr. Marolyn Haller.

## 2019-04-14 ENCOUNTER — Encounter: Admit: 2019-04-14 | Discharge: 2019-04-15 | Payer: MEDICARE

## 2019-04-14 NOTE — Unmapped (Signed)
[facility] Telephone Visit Note    Assessment/Recommendations:   85 y.o. smoker with h/o COPD, CAD s/p CABG,  HTN, active alcohol use, GIB 2/2 duodenal ulcer (likely 2/2 concurrent ibuprofen, alcohol and prednisone use) presenting for follow-up of  biopsy proven giant cell arteritis  complicated by ischemic optic neuropathy/ vision loss in left eye (dx 11/2017)    He was last seen 01/2018 and has h/o poor follow-up/    Currently taking tocilizumab 162 mg SQ qweek and tolerating without any side effects. Reports persistent vision loss of left eye. No vision changes, headaches, temporal artery tenderness, scalp tenderness, jaw pain/claudication, shoulder/hip stiffness, fevers, night sweat, weight loss, excessive fatigue    Will plan to continue tocilizumab 162 SQ qweek for time being; patient with known h/o GIB/duodenal ulcer which does increase risk of GI perforation on tocilizumab - however, he is now >12 months out from this without any complications and wishes to continue treatment with tocilizumab    Will update monitoring labs at next visit.  If all stable, may consider tapering tocilizumab from 162 mg SQ qweek to q2weeks    I spent 21 minutes on the phone with the patient. I spent an additional 10 minutes on pre- and post-visit activities.     The patient was physically located in [place] or a state in which I am permitted to provide care. The patient understood that s/he may incur co-pays and cost sharing, and agreed to the telemedicine visit. The visit was completed via phone and/or video, which was appropriate and reasonable under the circumstances given the patient's presentation at the time.    The patient has been advised of the potential risks and limitations of this mode of treatment (including, but not limited to, the absence of in-person examination) and has agreed to be treated using telemedicine. The patient's/patient's family's questions regarding telemedicine have been answered.     If the phone/video visit was completed in an ambulatory setting, the patient has also been advised to contact their provider???s office for worsening conditions, and seek emergency medical treatment and/or call 911 if the patient deems either necessary.    RTC 07/2019    ___________________________________________________________________    Primary Care Provider: Burnice Logan, MD    Reason for Visit: Evaluation [facility]     Identification: Pt self identified using name and date of birth    Patient location:   [address] [name]  [place] [address]      HPI:  Erik [name] is a 85 y.o. smoker with h/o COPD, CAD s/p CABG,  HTN,  ongoing alcohol use and GIB 2/2 duodenal ulcer (likely 2/2 concurrent ibuprofen, alcohol and prednisone use)presenting for follow-up of biopsy proven giant cell arteritis complicated by ischemic optic neuropathy/ vision loss in left eye.      Last seen 01/2018.  He has history of poor follow-up.     He is taking Actemra 162 mg SQ weekly (takes every Thursday).     Interim History:     He reports adherence with his Tocilzumab. He denies any side effects from the medication.  He injects in his abdomen    He denies headache. No new vision changes.   He continues to have impaired vision on the left. Right sided vision remains intact.  No scalp tenderness.  No temporal tenderness.  No jaw/tongue claudication.     He says that he has a spit gland that sometimes bothers him.      No fevers, night  sweats or unintended weight loss.     He continues to smoke.  He is not interested in quitting.  He says he wishes that his last breath is smoking on a cigarette.     He continues to drink alcohol daily . He is drinking about 3 beers a day.  He never drinks liquior. He is not interested in quitting. He says he cut back in the past and is happy that he no longer drinks a 12 pack before breakfast like he used to.     He has not had any more GIB    Review of Systems:   All other systems were reviewed and were negative Medical History:  Past Medical History:   Diagnosis Date   ??? Cataract 2005    cataract surgery   ??? COPD (chronic obstructive pulmonary disease) (CMS-HCC) 10/07/2016   ??? DJD (degenerative joint disease), lumbar    ??? Hypertension    ??? Tobacco abuse 02/15/2014       Allergies:  Patient has no known allergies.    Medications:     Current Outpatient Medications:   ???  amLODIPine (NORVASC) 10 MG tablet, Take 1 tablet (10 mg total) by mouth daily., Disp: 90 tablet, Rfl: 3  ???  ascorbic acid (VITAMIN C) 500 MG tablet, Take 500 mg by mouth daily., Disp: , Rfl:   ???  aspirin (ECOTRIN) 81 MG tablet, Take 81 mg by mouth daily., Disp: , Rfl:   ???  atenolol (TENORMIN) 50 MG tablet, Take 1 tablet (50 mg total) by mouth Two (2) times a day., Disp: 180 tablet, Rfl: 3  ???  cyanocobalamin (VITAMIN B-12) 1000 MCG tablet, Take 1,000 mcg by mouth daily., Disp: , Rfl:   ???  hydrALAZINE (APRESOLINE) 25 MG tablet, Take 1 tablet (25 mg total) by mouth daily., Disp: 90 tablet, Rfl: 11  ???  losartan (COZAAR) 100 MG tablet, Take 1 tablet (100 mg total) by mouth daily., Disp: 90 tablet, Rfl: 3  ???  multivitamin (TAB-A-VITE/THERAGRAN) per tablet, Take 1 tablet by mouth daily., Disp: , Rfl:   ???  oxyCODONE-acetaminophen (PERCOCET) 5-325 mg per tablet, Take 1 tablet by mouth every four (4) hours as needed for pain., Disp: 120 tablet, Rfl: 0  ???  simvastatin (ZOCOR) 10 MG tablet, Take 1 tablet (10 mg total) by mouth daily., Disp: 90 tablet, Rfl: 1  ???  tocilizumab (ACTEMRA) 162 mg/0.9 mL Syrg subcutaneous injection, Inject 0.9 mL (162 mg total) under the skin every seven (7) days., Disp: 4 mL, Rfl: 3  ???  triamcinolone (KENALOG) 0.1 % cream, Apply topically Two (2) times a day., Disp: 180 g, Rfl: 1    Surgical History:  Past Surgical History:   Procedure Laterality Date   ??? CARDIAC VALVE REPLACEMENT      triple bypass   ??? CATARACT EXTRACTION     ??? CORONARY ARTERY BYPASS GRAFT  2001    x3   ??? EYE SURGERY  2005    cataracts   ??? PR REPAIR ING HERNIA,5+Y/O,REDUCIBL Left 06/19/2015    Procedure: REPAIR INITIAL INGUINAL HERNIA, AGE 52 YEARS OR OLDER; REDUCIBLE;  Surgeon: Shelda Jakes, MD;  Location[facility] OR [facility];  Service: Trauma   ??? PR TEMPORAL ARTERY LIGATN OR BX Left 10/23/2017    Procedure: LIGATION OR BIOPSY, TEMPORAL ARTERY;  Surgeon: Vivien Rossetti, MD;  Location: [facility] OR [facility];  Service: ENT   ??? PR UPPER GI ENDOSCOPY,DIAGNOSIS N/A 12/17/2017    Procedure: UGI ENDO,  INCLUDE ESOPHAGUS, STOMACH, & DUODENUM &/OR JEJUNUM; DX W/WO COLLECTION SPECIMN, BY BRUSH OR WASH;  Surgeon: Bluford Kaufmann, MD;  [facility]: [facility];  Service: Gastroenterology       Social History:  Lives in [place], [place]; lives with daughter  Social History     Tobacco Use   ??? Smoking status: Current Every Day Smoker     Packs/day: 0.50     Years: 4.00     Pack years: 2.00   ??? Smokeless tobacco: Never Used   ??? Tobacco comment: quit for 25 years, restarted 2014   Substance Use Topics   ??? Alcohol use: Yes     Alcohol/week: 2.0 standard drinks     Types: 2 Cans of beer per week     Frequency: 4 or more times a week     Drinks per session: 1 or 2     Binge frequency: Never     Comment: daily   ??? Drug use: No       Family History:  Family History   Problem Relation Age of Onset   ??? Thyroid disease Sister    ??? Cancer Brother    ??? Glaucoma Neg Hx    ??? Macular degeneration Neg Hx    ??? Retinal detachment Neg Hx    ??? Substance Abuse Disorder Neg Hx    ??? Mental illness Neg Hx        Physical Exam:  General:  Alert and interactive; does not sound in distress  Pulmonary:  Speaking in full sentences; no cough, no wheezing  Psych: Mood and affect appropriate and congruent         Test Results    Final Diagnosis   A: Temporal artery, left, biopsy   - Segment of temporal artery, positive for active temporal arteritis.

## 2019-04-22 MED ORDER — SIMVASTATIN 10 MG TABLET
ORAL_TABLET | Freq: Every day | ORAL | 1 refills | 0.00000 days | Status: CP
Start: 2019-04-22 — End: 2019-08-05

## 2019-04-22 NOTE — Unmapped (Signed)
P/C requesting refill on Percocet , last ordered on 03/25/2019, last visit 01/01/2019. Contract signed 01/01/2019,UDS obtained on 04/11/17 and was appropriate.

## 2019-04-24 MED ORDER — OXYCODONE-ACETAMINOPHEN 5 MG-325 MG TABLET
ORAL_TABLET | ORAL | 0 refills | 0 days | Status: CP | PRN
Start: 2019-04-24 — End: 2019-05-20

## 2019-05-05 ENCOUNTER — Encounter: Admit: 2019-05-05 | Discharge: 2019-05-06 | Payer: MEDICARE

## 2019-05-05 DIAGNOSIS — G894 Chronic pain syndrome: Secondary | ICD-10-CM

## 2019-05-05 DIAGNOSIS — M316 Other giant cell arteritis: Secondary | ICD-10-CM

## 2019-05-05 DIAGNOSIS — J449 Chronic obstructive pulmonary disease, unspecified: Secondary | ICD-10-CM

## 2019-05-05 DIAGNOSIS — I10 Essential (primary) hypertension: Principal | ICD-10-CM

## 2019-05-05 MED ORDER — HYDRALAZINE 25 MG TABLET
ORAL_TABLET | Freq: Every day | ORAL | 11 refills | 0.00000 days | Status: CP
Start: 2019-05-05 — End: ?

## 2019-05-05 MED ORDER — AMLODIPINE 10 MG TABLET
ORAL_TABLET | Freq: Every day | ORAL | 3 refills | 0 days | Status: CP
Start: 2019-05-05 — End: 2020-05-05

## 2019-05-05 MED ORDER — LOSARTAN 100 MG TABLET
ORAL_TABLET | Freq: Every day | ORAL | 3 refills | 0 days | Status: CP
Start: 2019-05-05 — End: ?

## 2019-05-05 MED ORDER — ATENOLOL 50 MG TABLET
ORAL_TABLET | Freq: Two times a day (BID) | ORAL | 3 refills | 0.00000 days | Status: CP
Start: 2019-05-05 — End: ?

## 2019-05-05 NOTE — Unmapped (Signed)
Name:  Erik [name]  DOB: 1934/04/23  Today's Date: 05/05/2019  Age:  85 y.o.    Assessment and Plan:     Square was seen today for follow-up.    Diagnoses and all orders for this visit:    Chronic pain syndrome    Essential hypertension, benign  -     atenoloL (TENORMIN) 50 MG tablet; Take 1 tablet (50 mg total) by mouth Two (2) times a day.  -     losartan (COZAAR) 100 MG tablet; Take 1 tablet (100 mg total) by mouth daily.  -     hydrALAZINE (APRESOLINE) 25 MG tablet; Take 1 tablet (25 mg total) by mouth daily.  -     amLODIPine (NORVASC) 10 MG tablet; Take 1 tablet (10 mg total) by mouth daily.    Chronic obstructive pulmonary disease, unspecified COPD type (CMS-HCC)    Giant cell arteritis (CMS-HCC)         I spent 15 minutes on the phone with the patient. I spent an additional 5 minutes on pre- and post-visit activities.     The patient was physically located in [place] or a state in which I am permitted to provide care. The patient and/or parent/gauardian understood that s/he may incur co-pays and cost sharing, and agreed to the telemedicine visit. The visit was completed via phone and/or video, which was appropriate and reasonable under the circumstances given the patient's presentation at the time.    The patient and/or parent/guardian has been advised of the potential risks and limitations of this mode of treatment (including, but not limited to, the absence of in-person examination) and has agreed to be treated using telemedicine. The patient's/patient's family's questions regarding telemedicine have been answered.     If the phone/video visit was completed in an ambulatory setting, the patient and/or parent/guardian has also been advised to contact their provider???s office for worsening conditions, and seek emergency medical treatment and/or call 911 if the patient deems either necessary.              HPI:      Erik [name]  is being evaluated by telephone for   Chief Complaint   Patient presents with   ??? Follow-up       He presents for follow-up. His chronic pain is controlled adequately on current medications (OXYCODONE/APAP).   His hypertension is controlled.  He monitors it at home and reports that it stays below 135 and below 70 in general. He reports 132/57 today.  His COPD is stable. He walks a mile or so every day. He is avoiding contact with people due to COVID 19 pandemic.  Vision unchanged. No headaches. Followed by rheumatology, ophthalmology.       ROS:      Review of Systems    Constitutional:  No complaints.  Eyes: See HPI.  ENT: No complaints. He has noted some tenderness/intermittent pain in the right spit gland (in front of ear, jaw) in the past couple of days. No noticeable swelling. No definite ear or tooth pain. (Advised to apply hot compresses PRN, and to call if symptoms worsen or do not improve in next few days).  Respiratory: No complaints.  Cardiovascular: No complaints.  Gastrointestinal:  No complaints except for occasional constipation controlled with MIRALAX PRN.  Genitourinary:  No complaints.  Neurological:  No complaints.  Psychiatric:  No complaints.       Vital Signs:     Wt Readings from Last 3  Encounters:   04/14/19 61.2 kg (135 lb)   01/01/19 60.8 kg (134 lb)   11/05/18 59.4 kg (131 lb)     Temp Readings from Last 3 Encounters:   01/01/19 36.8 ??C (98.2 ??F) (Oral)   11/05/18 36.9 ??C (98.5 ??F) (Oral)   07/03/18 36.9 ??C (98.5 ??F) (Oral)     BP Readings from Last 3 Encounters:   04/14/19 128/56   01/01/19 138/60   11/05/18 132/56     Pulse Readings from Last 3 Encounters:   01/01/19 55   11/05/18 62   07/03/18 63     Estimated body mass index is 26.37 kg/m?? as calculated from the following:    Height as of 01/01/19: 152.4 cm (5').    Weight as of 04/14/19: 61.2 kg (135 lb).  No height and weight on file for this encounter.        Objective:     This was a telephone encounter, and therefore no physical examination was performed.      Medication adherence and barriers to the treatment plan have been addressed. Opportunities to optimize healthy behaviors have been discussed. Patient / caregiver voiced understanding.         Past Medical/Surgical History:     Past Medical History:   Diagnosis Date   ??? Cataract 2005    cataract surgery   ??? COPD (chronic obstructive pulmonary disease) (CMS-HCC) 10/07/2016   ??? DJD (degenerative joint disease), lumbar    ??? Hypertension    ??? Tobacco abuse 02/15/2014     Past Surgical History:   Procedure Laterality Date   ??? CARDIAC VALVE REPLACEMENT      triple bypass   ??? CATARACT EXTRACTION     ??? CORONARY ARTERY BYPASS GRAFT  2001    x3   ??? EYE SURGERY  2005    cataracts   ??? PR REPAIR ING HERNIA,5+Y/O,REDUCIBL Left 06/19/2015    Procedure: REPAIR INITIAL INGUINAL HERNIA, AGE 55 YEARS OR OLDER; REDUCIBLE;  Surgeon: Shelda Jakes, MD;  [facility];  Service: Trauma   ??? PR TEMPORAL ARTERY LIGATN OR BX Left 10/23/2017    Procedure: LIGATION OR BIOPSY, TEMPORAL ARTERY;  Surgeon: Vivien Rossetti, MD;  Location: [facility] OR [facility];  Service: ENT   ??? PR UPPER GI ENDOSCOPY,DIAGNOSIS N/A 12/17/2017    Procedure: UGI ENDO, INCLUDE ESOPHAGUS, STOMACH, & DUODENUM &/OR JEJUNUM; DX W/WO COLLECTION SPECIMN, BY BRUSH OR WASH;  Surgeon: Bluford Kaufmann, MD;  [facility]: [facility];  Service: Gastroenterology         Family History:     Family History   Problem Relation Age of Onset   ??? Thyroid disease Sister    ??? Cancer Brother    ??? Glaucoma Neg Hx    ??? Macular degeneration Neg Hx    ??? Retinal detachment Neg Hx    ??? Substance Abuse Disorder Neg Hx    ??? Mental illness Neg Hx        Social History:     Social History     Socioeconomic History   ??? Marital status: Widowed     Spouse name: None   ??? Number of children: None   ??? Years of education: None   ??? Highest education level: None   Occupational History   ??? None   Social Needs   ??? Financial resource strain: None   ??? Food insecurity     Worry: None     Inability: None   ???  Transportation needs Medical: None     Non-medical: None   Tobacco Use   ??? Smoking status: Current Every Day Smoker     Packs/day: 0.50     Years: 4.00     Pack years: 2.00   ??? Smokeless tobacco: Never Used   ??? Tobacco comment: quit for 25 years, restarted 2014   Substance and Sexual Activity   ??? Alcohol use: Yes     Alcohol/week: 2.0 standard drinks     Types: 2 Cans of beer per week     Frequency: 4 or more times a week     Drinks per session: 1 or 2     Binge frequency: Never     Comment: daily   ??? Drug use: No   ??? Sexual activity: None   Lifestyle   ??? Physical activity     Days per week: None     Minutes per session: None   ??? Stress: None   Relationships   ??? Social Wellsite geologist on phone: None     Gets together: None     Attends religious service: None     Active member of club or organization: None     Attends meetings of clubs or organizations: None     Relationship status: None   Other Topics Concern   ??? Do you use sunscreen? No   ??? Tanning bed use? No   ??? Are you easily burned? No   ??? Excessive sun exposure? No   ??? Blistering sunburns? Yes     Comment: Past   Social History Narrative    Lives in [place], wife passed 7 years ago. His daughter lives with him. Granddaughter, Victorino Dike, manages his finances.          Allergies:     Patient has no known allergies.    Current Medications:     Current Outpatient Medications   Medication Sig Dispense Refill   ??? amLODIPine (NORVASC) 10 MG tablet Take 1 tablet (10 mg total) by mouth daily. 90 tablet 3   ??? ascorbic acid (VITAMIN C) 500 MG tablet Take 500 mg by mouth daily.     ??? aspirin (ECOTRIN) 81 MG tablet Take 81 mg by mouth daily.     ??? atenoloL (TENORMIN) 50 MG tablet Take 1 tablet (50 mg total) by mouth Two (2) times a day. 180 tablet 3   ??? cyanocobalamin (VITAMIN B-12) 1000 MCG tablet Take 1,000 mcg by mouth daily.     ??? hydrALAZINE (APRESOLINE) 25 MG tablet Take 1 tablet (25 mg total) by mouth daily. 90 tablet 11   ??? losartan (COZAAR) 100 MG tablet Take 1 tablet (100 mg total) by mouth daily. 90 tablet 3   ??? multivitamin (TAB-A-VITE/THERAGRAN) per tablet Take 1 tablet by mouth daily.     ??? oxyCODONE-acetaminophen (PERCOCET) 5-325 mg per tablet Take 1 tablet by mouth every four (4) hours as needed for pain. 120 tablet 0   ??? simvastatin (ZOCOR) 10 MG tablet Take 1 tablet (10 mg total) by mouth daily. 90 tablet 1   ??? tocilizumab (ACTEMRA) 162 mg/0.9 mL Syrg subcutaneous injection Inject 0.9 mL (162 mg total) under the skin every seven (7) days. 4 mL 3     No current facility-administered medications for this visit.          POCT:     Results for orders placed or performed in visit on 07/03/18   CBC   Result Value  Ref Range    WBC 6.1 4.5 - 11.0 10*9/L    RBC 3.98 (L) 4.50 - 5.90 10*12/L    HGB 13.4 (L) 13.5 - 17.5 g/dL    HCT 16.1 (L) 09.6 - 53.0 %    MCV 100.6 (H) 80.0 - 100.0 fL    MCH 33.7 26.0 - 34.0 pg    MCHC 33.5 31.0 - 37.0 g/dL    RDW 04.5 40.9 - 81.1 %    MPV 8.8 7.0 - 10.0 fL    Platelet 191 150 - 440 10*9/L   Comprehensive Metabolic Panel   Result Value Ref Range    Sodium 131 (L) 135 - 145 mmol/L    Potassium 4.1 3.5 - 5.0 mmol/L    Chloride 95 (L) 98 - 107 mmol/L    CO2 26.0 22.0 - 30.0 mmol/L    Anion Gap 10 9 - 15 mmol/L    BUN 17 7 - 21 mg/dL    Creatinine 9.14 7.82 - 1.30 mg/dL    BUN/Creatinine Ratio 22     EGFR CKD-EPI Non-African American, Male 23 >=60 mL/min/1.51m2    EGFR CKD-EPI African American, Male >90 >=60 mL/min/1.4m2    Glucose 101 65 - 179 mg/dL    Calcium 95.6 8.5 - 21.3 mg/dL    Albumin 4.4 3.5 - 5.0 g/dL    Total Protein 6.9 6.5 - 8.3 g/dL    Total Bilirubin 0.4 0.0 - 1.2 mg/dL    AST 28 19 - 55 U/L    ALT 26 19 - 72 U/L    Alkaline Phosphatase 52 38 - 126 U/L   Sedimentation Rate   Result Value Ref Range    Sed Rate 1 0 - 20 mm/h   C-reactive protein   Result Value Ref Range    CRP <5.0 <10.0 mg/L         Gloyd Happ Dorthula Matas, MD

## 2019-05-20 NOTE — Unmapped (Signed)
P/C requesting refill on Oxycodone , last ordered on 04/24/2019, last visit 05/05/2019. Contract signed 01/01/2019,UDS obtained on 04/11/17 and was appropriate.

## 2019-05-24 MED ORDER — OXYCODONE-ACETAMINOPHEN 5 MG-325 MG TABLET
ORAL_TABLET | ORAL | 0 refills | 0 days | Status: CP | PRN
Start: 2019-05-24 — End: 2019-06-18

## 2019-06-18 NOTE — Unmapped (Signed)
P/C requesting refill on Percocet , last ordered on 05/24/2019, last visit 05/05/2019. Contract signed 01/01/2019,UDS obtained on 04/11/17 and was appropriate

## 2019-06-18 NOTE — Unmapped (Signed)
Patient called in requesting a refill on his percocet. His medication will run out on 06/22/2019. Phone number: [phone].

## 2019-06-23 MED ORDER — OXYCODONE-ACETAMINOPHEN 5 MG-325 MG TABLET
ORAL_TABLET | ORAL | 0 refills | 0.00000 days | Status: CP | PRN
Start: 2019-06-23 — End: 2019-07-21

## 2019-07-21 NOTE — Unmapped (Signed)
P/C requesting refill on Oxycodone , last ordered on 06/23/2019, last visit 05/05/2019.  Contract signed 01/01/2019,UDS obtained on 04/11/17 and was appropriate.

## 2019-07-23 MED ORDER — OXYCODONE-ACETAMINOPHEN 5 MG-325 MG TABLET
ORAL_TABLET | ORAL | 0 refills | 20 days | Status: CP | PRN
Start: 2019-07-23 — End: 2019-08-05

## 2019-08-05 ENCOUNTER — Encounter: Admit: 2019-08-05 | Discharge: 2019-08-06 | Payer: MEDICARE

## 2019-08-05 DIAGNOSIS — I25810 Atherosclerosis of coronary artery bypass graft(s) without angina pectoris: Secondary | ICD-10-CM

## 2019-08-05 DIAGNOSIS — M5440 Lumbago with sciatica, unspecified side: Secondary | ICD-10-CM

## 2019-08-05 DIAGNOSIS — I10 Essential (primary) hypertension: Principal | ICD-10-CM

## 2019-08-05 DIAGNOSIS — G8929 Other chronic pain: Secondary | ICD-10-CM

## 2019-08-05 DIAGNOSIS — J449 Chronic obstructive pulmonary disease, unspecified: Secondary | ICD-10-CM

## 2019-08-05 DIAGNOSIS — G894 Chronic pain syndrome: Secondary | ICD-10-CM

## 2019-08-05 MED ORDER — FLUTICASONE 250 MCG-SALMETEROL 50 MCG/DOSE BLISTR POWDR FOR INHALATION
Freq: Two times a day (BID) | RESPIRATORY_TRACT | 11 refills | 30 days | Status: CP
Start: 2019-08-05 — End: 2020-08-04

## 2019-08-05 MED ORDER — SIMVASTATIN 10 MG TABLET
ORAL_TABLET | Freq: Every day | ORAL | 3 refills | 90 days | Status: CP
Start: 2019-08-05 — End: 2020-08-05

## 2019-08-06 NOTE — Unmapped (Signed)
Name:  Erik [name]  DOB: May 05, 1934  Today's Date: 08/05/2019  Age:  85 y.o.    Assessment and Plan:     [name] was seen today for hypertension and pain.    Diagnoses and all orders for this visit:    Essential hypertension, benign  -     CBC  -     Comprehensive Metabolic Panel    Coronary artery disease involving autologous vein coronary bypass graft without angina pectoris  -     simvastatin (ZOCOR) 10 MG tablet; Take 1 tablet (10 mg total) by mouth daily.    Chronic bilateral low back pain with sciatica, sciatica laterality unspecified  -     oxyCODONE-acetaminophen (PERCOCET) 5-325 mg per tablet; Take 1 tablet by mouth every four (4) hours as needed for pain.    Chronic pain syndrome    Chronic obstructive pulmonary disease, unspecified COPD type (CMS-HCC)  -     fluticasone propion-salmeteroL (ADVAIR DISKUS) 250-50 mcg/dose diskus; Inhale 1 puff Two (2) times a day.         I spent 12 minutes on the phone with the patient. I spent an additional 5 minutes on pre- and post-visit activities.     The patient was physically located in [place] or a state in which I am permitted to provide care. The patient and/or parent/guardian understood that s/he may incur co-pays and cost sharing, and agreed to the telemedicine visit. The visit was reasonable and appropriate under the circumstances given the patient's presentation at the time.    The patient and/or parent/guardian has been advised of the potential risks and limitations of this mode of treatment (including, but not limited to, the absence of in-person examination) and has agreed to be treated using telemedicine. The patient's/patient's family's questions regarding telemedicine have been answered.     If the visit was completed in an ambulatory setting, the patient and/or parent/guardian has also been advised to contact their provider???s office for worsening conditions, and seek emergency medical treatment and/or call 911 if the patient deems either necessary.              HPI:      Erik [name]  is being evaluated by telephone for   Chief Complaint   Patient presents with   ??? Hypertension   ??? Pain       He monitors his blood pressure frequently at home, and reports that it is consistently in the 120-130/70-80 range.   His chronic pain is controlled on current medications.  He has always declined medication for his COPD, but he is willing to try an inhaler because he coughs up phlegm at night and has some dyspnea on exertion at times.  No fever, chills, weight loss.     ROS:      Review of Systems    Constitutional:  No complaints.  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  ENT: No complaints.  Respiratory: See HPI.  Cardiovascular: No complaints.  Gastrointestinal:  No complaints.  Genitourinary:  No complaints.  Neurological:  No complaints.  Psychiatric:  No complaints.       Vital Signs:     Wt Readings from Last 3 Encounters:   04/14/19 61.2 kg (135 lb)   01/01/19 60.8 kg (134 lb)   11/05/18 59.4 kg (131 lb)     Temp Readings from Last 3 Encounters:   01/01/19 36.8 ??C (98.2 ??F) (Oral)   11/05/18 36.9 ??C (98.5 ??F) (  Oral)   07/03/18 36.9 ??C (98.5 ??F) (Oral)     BP Readings from Last 3 Encounters:   04/14/19 128/56   01/01/19 138/60   11/05/18 132/56     Pulse Readings from Last 3 Encounters:   01/01/19 55   11/05/18 62   07/03/18 63     Estimated body mass index is 26.37 kg/m?? as calculated from the following:    Height as of 01/01/19: 152.4 cm (5').    Weight as of 04/14/19: 61.2 kg (135 lb).  No height and weight on file for this encounter.        Objective:     This was a telephone encounter, and therefore no physical examination was performed.      Medication adherence and barriers to the treatment plan have been addressed. Opportunities to optimize healthy behaviors have been discussed. Patient / caregiver voiced understanding.         Past Medical/Surgical History:     Past Medical History:   Diagnosis Date   ??? Cataract 2005    cataract surgery ??? COPD (chronic obstructive pulmonary disease) (CMS-HCC) 10/07/2016   ??? DJD (degenerative joint disease), lumbar    ??? Hypertension    ??? Tobacco abuse 02/15/2014     Past Surgical History:   Procedure Laterality Date   ??? CARDIAC VALVE REPLACEMENT      triple bypass   ??? CATARACT EXTRACTION     ??? CORONARY ARTERY BYPASS GRAFT  2001    x3   ??? EYE SURGERY  2005    cataracts   ??? PR REPAIR ING HERNIA,5+Y/O,REDUCIBL Left 06/19/2015    Procedure: REPAIR INITIAL INGUINAL HERNIA, AGE 81 YEARS OR OLDER; REDUCIBLE;  Surgeon: Shelda Jakes, MD;  [facility]: [facility];  Service: Trauma   ??? PR TEMPORAL ARTERY LIGATN OR BX Left 10/23/2017    Procedure: LIGATION OR BIOPSY, TEMPORAL ARTERY;  Surgeon: Vivien Rossetti, MD;  Location: [facility] OR [facility];  Service: ENT   ??? PR UPPER GI ENDOSCOPY,DIAGNOSIS N/A 12/17/2017    Procedure: UGI ENDO, INCLUDE ESOPHAGUS, STOMACH, & DUODENUM &/OR JEJUNUM; DX W/WO COLLECTION SPECIMN, BY BRUSH OR WASH;  Surgeon: Bluford Kaufmann, MD;  [facility]: [facility];  Service: Gastroenterology         Family History:     Family History   Problem Relation Age of Onset   ??? Thyroid disease Sister    ??? Cancer Brother    ??? Glaucoma Neg Hx    ??? Macular degeneration Neg Hx    ??? Retinal detachment Neg Hx    ??? Substance Abuse Disorder Neg Hx    ??? Mental illness Neg Hx        Social History:     Social History     Socioeconomic History   ??? Marital status: Widowed     Spouse name: None   ??? Number of children: None   ??? Years of education: None   ??? Highest education level: None   Occupational History   ??? None   Social Needs   ??? Financial resource strain: None   ??? Food insecurity     Worry: None     Inability: None   ??? Transportation needs     Medical: None     Non-medical: None   Tobacco Use   ??? Smoking status: Current Every Day Smoker     Packs/day: 0.50     Years: 4.00     Pack years: 2.00   ???  Smokeless tobacco: Never Used   ??? Tobacco comment: quit for 25 years, restarted 2014   Substance and Sexual Activity   ??? Alcohol use: Yes     Alcohol/week: 2.0 standard drinks     Types: 2 Cans of beer per week     Frequency: 4 or more times a week     Drinks per session: 1 or 2     Binge frequency: Never     Comment: daily   ??? Drug use: No   ??? Sexual activity: None   Lifestyle   ??? Physical activity     Days per week: None     Minutes per session: None   ??? Stress: None   Relationships   ??? Social Wellsite geologist on phone: None     Gets together: None     Attends religious service: None     Active member of club or organization: None     Attends meetings of clubs or organizations: None     Relationship status: None   Other Topics Concern   ??? Do you use sunscreen? No   ??? Tanning bed use? No   ??? Are you easily burned? No   ??? Excessive sun exposure? No   ??? Blistering sunburns? Yes     Comment: Past   Social History Narrative    Lives in [place], wife passed 7 years ago. His daughter lives with him. Granddaughter, Victorino Dike, manages his finances.          Allergies:     Patient has no known allergies.    Current Medications:     Current Outpatient Medications   Medication Sig Dispense Refill   ??? amLODIPine (NORVASC) 10 MG tablet Take 1 tablet (10 mg total) by mouth daily. 90 tablet 3   ??? ascorbic acid (VITAMIN C) 500 MG tablet Take 500 mg by mouth daily.     ??? aspirin (ECOTRIN) 81 MG tablet Take 81 mg by mouth daily.     ??? atenoloL (TENORMIN) 50 MG tablet Take 1 tablet (50 mg total) by mouth Two (2) times a day. 180 tablet 3   ??? cyanocobalamin (VITAMIN B-12) 1000 MCG tablet Take 1,000 mcg by mouth daily.     ??? fluticasone propion-salmeteroL (ADVAIR DISKUS) 250-50 mcg/dose diskus Inhale 1 puff Two (2) times a day. 60 each 11   ??? hydrALAZINE (APRESOLINE) 25 MG tablet Take 1 tablet (25 mg total) by mouth daily. 90 tablet 11   ??? losartan (COZAAR) 100 MG tablet Take 1 tablet (100 mg total) by mouth daily. 90 tablet 3   ??? multivitamin (TAB-A-VITE/THERAGRAN) per tablet Take 1 tablet by mouth daily.     ??? [START ON 08/19/2019] oxyCODONE-acetaminophen (PERCOCET) 5-325 mg per tablet Take 1 tablet by mouth every four (4) hours as needed for pain. 120 tablet 0   ??? simvastatin (ZOCOR) 10 MG tablet Take 1 tablet (10 mg total) by mouth daily. 90 tablet 3   ??? tocilizumab (ACTEMRA) 162 mg/0.9 mL Syrg subcutaneous injection Inject 0.9 mL (162 mg total) under the skin every seven (7) days. 4 mL 3     No current facility-administered medications for this visit.          POCT:     Results for orders placed or performed in visit on 07/03/18   CBC   Result Value Ref Range    WBC 6.1 4.5 - 11.0 10*9/L    RBC 3.98 (L) 4.50 - 5.90 10*12/L  HGB 13.4 (L) 13.5 - 17.5 g/dL    HCT 11.9 (L) 14.7 - 53.0 %    MCV 100.6 (H) 80.0 - 100.0 fL    MCH 33.7 26.0 - 34.0 pg    MCHC 33.5 31.0 - 37.0 g/dL    RDW 82.9 56.2 - 13.0 %    MPV 8.8 7.0 - 10.0 fL    Platelet 191 150 - 440 10*9/L   Comprehensive Metabolic Panel   Result Value Ref Range    Sodium 131 (L) 135 - 145 mmol/L    Potassium 4.1 3.5 - 5.0 mmol/L    Chloride 95 (L) 98 - 107 mmol/L    CO2 26.0 22.0 - 30.0 mmol/L    Anion Gap 10 9 - 15 mmol/L    BUN 17 7 - 21 mg/dL    Creatinine 8.65 7.84 - 1.30 mg/dL    BUN/Creatinine Ratio 22     EGFR CKD-EPI Non-African American, Male 80 >=60 mL/min/1.66m2    EGFR CKD-EPI African American, Male >90 >=60 mL/min/1.46m2    Glucose 101 65 - 179 mg/dL    Calcium 69.6 8.5 - 29.5 mg/dL    Albumin 4.4 3.5 - 5.0 g/dL    Total Protein 6.9 6.5 - 8.3 g/dL    Total Bilirubin 0.4 0.0 - 1.2 mg/dL    AST 28 19 - 55 U/L    ALT 26 19 - 72 U/L    Alkaline Phosphatase 52 38 - 126 U/L   Sedimentation Rate   Result Value Ref Range    Sed Rate 1 0 - 20 mm/h   C-reactive protein   Result Value Ref Range    CRP <5.0 <10.0 mg/L         Lucia Harm Dorthula Matas, MD

## 2019-08-17 ENCOUNTER — Encounter: Admit: 2019-08-17 | Discharge: 2019-08-18 | Payer: MEDICARE

## 2019-08-17 DIAGNOSIS — M316 Other giant cell arteritis: Principal | ICD-10-CM

## 2019-08-17 DIAGNOSIS — Z79899 Other long term (current) drug therapy: Secondary | ICD-10-CM

## 2019-08-17 DIAGNOSIS — Z5181 Encounter for therapeutic drug level monitoring: Secondary | ICD-10-CM

## 2019-08-17 NOTE — Unmapped (Signed)
[facility] Telephone Visit Note    Assessment/Recommendations:   85 y.o. smoker with h/o COPD, CAD s/p CABG,  HTN, active alcohol use, GIB 2/2 duodenal ulcer (likely 2/2 concurrent ibuprofen, alcohol and prednisone use) presenting for follow-up of  biopsy proven giant cell arteritis  complicated by ischemic optic neuropathy/ vision loss in left eye (dx 11/2017)    He was last seen 03/2019 as telemedicine visit.       Currently taking tocilizumab 162 mg SQ qweek and tolerating without any side effects. Reports persistent vision loss of left eye. No new vision changes, headaches, temporal artery tenderness, scalp tenderness, jaw pain/claudication, shoulder/hip stiffness, fevers, night sweat, weight loss, excessive fatigue    Will plan to continue tocilizumab 162 SQ qweek for time being; patient with known h/o GIB/duodenal ulcer which does increase risk of GI perforation on tocilizumab - however, he is now >12 months out from this without any complications and wishes to continue treatment with tocilizumab    - Update monitoring labs: CBC w/ diff, Cr, LFTs, ESR, CRP  - Continue tocilizumab 162 mg SQ qweek; may consider tapering to q2 weeks at next visit    I spent 15 minutes on the phone with the patient. I spent an additional 10 minutes on pre- and post-visit activities.     The patient was physically located in [place] or a state in which I am permitted to provide care. The patient and/or parent/guardian understood that s/he may incur co-pays and cost sharing, and agreed to the telemedicine visit. The visit was reasonable and appropriate under the circumstances given the patient's presentation at the time.    The patient and/or parent/guardian has been advised of the potential risks and limitations of this mode of treatment (including, but not limited to, the absence of in-person examination) and has agreed to be treated using telemedicine. The patient's/patient's family's questions regarding telemedicine have been answered.     If the visit was completed in an ambulatory setting, the patient and/or parent/guardian has also been advised to contact their provider???s office for worsening conditions, and seek emergency medical treatment and/or call 911 if the patient deems either necessary.    RTC 6 months or sooner PRN    ___________________________________________________________________    Primary Care Provider: Burnice Logan, MD    Reason for Visit: Evaluation GCA     Identification: Pt self identified using name and date of birth    Patient location:   [address] Erik [name]  [place] [address]      HPI:  Erik [name] is a 85 y.o. smoker with h/o COPD, CAD s/p CABG,  HTN,  ongoing alcohol use and GIB 2/2 duodenal ulcer (likely 2/2 concurrent ibuprofen, alcohol and prednisone use) presenting for follow-up of biopsy proven giant cell arteritis complicated by ischemic optic neuropathy/ vision loss in left eye.    Last seen 03/2019 as telemedicine visit    He is taking Actemra 162 mg SQ weekly (takes every Thursday).     Interim History:     He reports adherence with his Tocilzumab. He says that everything is going excellent.  He has not had any side effects from this.  No injection site reactions.     He denies new headache. No new vision changes.   He continues to have impaired vision on the left - he reports flickers in his left eye. Right sided vision remains intact.  No scalp tenderness.  No temporal tenderness.  No jaw/tongue claudication.  No fevers, night sweats or unintended weight loss.       Review of Systems:   All other systems were reviewed and were negative    Medical History:  Past Medical History:   Diagnosis Date   ??? Cataract 2005    cataract surgery   ??? COPD (chronic obstructive pulmonary disease) (CMS-HCC) 10/07/2016   ??? DJD (degenerative joint disease), lumbar    ??? Hypertension    ??? Tobacco abuse 02/15/2014       Allergies:  Patient has no known allergies.    Medications:     Current Outpatient Medications:   ???  amLODIPine (NORVASC) 10 MG tablet, Take 1 tablet (10 mg total) by mouth daily., Disp: 90 tablet, Rfl: 3  ???  ascorbic acid (VITAMIN C) 500 MG tablet, Take 500 mg by mouth daily., Disp: , Rfl:   ???  aspirin (ECOTRIN) 81 MG tablet, Take 81 mg by mouth daily., Disp: , Rfl:   ???  atenoloL (TENORMIN) 50 MG tablet, Take 1 tablet (50 mg total) by mouth Two (2) times a day., Disp: 180 tablet, Rfl: 3  ???  cyanocobalamin (VITAMIN B-12) 1000 MCG tablet, Take 1,000 mcg by mouth daily., Disp: , Rfl:   ???  fluticasone propion-salmeteroL (ADVAIR DISKUS) 250-50 mcg/dose diskus, Inhale 1 puff Two (2) times a day., Disp: 60 each, Rfl: 11  ???  hydrALAZINE (APRESOLINE) 25 MG tablet, Take 1 tablet (25 mg total) by mouth daily., Disp: 90 tablet, Rfl: 11  ???  losartan (COZAAR) 100 MG tablet, Take 1 tablet (100 mg total) by mouth daily., Disp: 90 tablet, Rfl: 3  ???  multivitamin (TAB-A-VITE/THERAGRAN) per tablet, Take 1 tablet by mouth daily., Disp: , Rfl:   ???  [START ON 08/19/2019] oxyCODONE-acetaminophen (PERCOCET) 5-325 mg per tablet, Take 1 tablet by mouth every four (4) hours as needed for pain., Disp: 120 tablet, Rfl: 0  ???  simvastatin (ZOCOR) 10 MG tablet, Take 1 tablet (10 mg total) by mouth daily., Disp: 90 tablet, Rfl: 3  ???  tocilizumab (ACTEMRA) 162 mg/0.9 mL Syrg subcutaneous injection, Inject 0.9 mL (162 mg total) under the skin every seven (7) days., Disp: 4 mL, Rfl: 3    Surgical History:  Past Surgical History:   Procedure Laterality Date   ??? CARDIAC VALVE REPLACEMENT      triple bypass   ??? CATARACT EXTRACTION     ??? CORONARY ARTERY BYPASS GRAFT  2001    x3   ??? EYE SURGERY  2005    cataracts   ??? PR REPAIR ING HERNIA,5+Y/O,REDUCIBL Left 06/19/2015    Procedure: REPAIR INITIAL INGUINAL HERNIA, AGE 28 YEARS OR OLDER; REDUCIBLE;  Surgeon: Shelda Jakes, MD;  [facility];  Service: Trauma   ??? PR TEMPORAL ARTERY LIGATN OR BX Left 10/23/2017    Procedure: LIGATION OR BIOPSY, TEMPORAL ARTERY;  Surgeon: Vivien Rossetti, MD;  [facility];  Service: ENT   ??? PR UPPER GI ENDOSCOPY,DIAGNOSIS N/A 12/17/2017    Procedure: UGI ENDO, INCLUDE ESOPHAGUS, STOMACH, & DUODENUM &/OR JEJUNUM; DX W/WO COLLECTION SPECIMN, BY BRUSH OR WASH;  Surgeon: Bluford Kaufmann, MD;  [facility]: [facility];  Service: Gastroenterology       Social History:  Lives in [place], [place]; lives with daughter  Social History     Tobacco Use   ??? Smoking status: Current Every Day Smoker     Packs/day: 0.50     Years: 4.00     Pack  years: 2.00   ??? Smokeless tobacco: Never Used   ??? Tobacco comment: quit for 25 years, restarted 2014   Substance Use Topics   ??? Alcohol use: Yes     Alcohol/week: 2.0 standard drinks     Types: 2 Cans of beer per week     Frequency: 4 or more times a week     Drinks per session: 1 or 2     Binge frequency: Never     Comment: daily   ??? Drug use: No       Family History:  Family History   Problem Relation Age of Onset   ??? Thyroid disease Sister    ??? Cancer Brother    ??? Glaucoma Neg Hx    ??? Macular degeneration Neg Hx    ??? Retinal detachment Neg Hx    ??? Substance Abuse Disorder Neg Hx    ??? Mental illness Neg Hx        Physical Exam:  General:  Alert and interactive; does not sound in distress  Pulmonary:  Speaking in full sentences; no cough, no wheezing  Psych: Mood and affect appropriate and congruent       Test Results    Final Diagnosis   A: Temporal artery, left, biopsy   - Segment of temporal artery, positive for active temporal arteritis.

## 2019-08-19 MED ORDER — OXYCODONE-ACETAMINOPHEN 5 MG-325 MG TABLET
ORAL_TABLET | ORAL | 0 refills | 20 days | Status: CP | PRN
Start: 2019-08-19 — End: 2019-09-18

## 2019-09-16 ENCOUNTER — Other Ambulatory Visit: Admit: 2019-09-16 | Discharge: 2019-09-17 | Payer: MEDICARE

## 2019-09-16 DIAGNOSIS — Z79899 Other long term (current) drug therapy: Secondary | ICD-10-CM

## 2019-09-16 DIAGNOSIS — Z5181 Encounter for therapeutic drug level monitoring: Secondary | ICD-10-CM

## 2019-09-16 LAB — CBC W/ AUTO DIFF
BASOPHILS RELATIVE PERCENT: 0.6 %
EOSINOPHILS ABSOLUTE COUNT: 0.5 10*9/L — ABNORMAL HIGH (ref 0.0–0.4)
EOSINOPHILS RELATIVE PERCENT: 5.8 %
HEMATOCRIT: 42.8 % (ref 41.0–53.0)
LARGE UNSTAINED CELLS: 2 % (ref 0–4)
LYMPHOCYTES ABSOLUTE COUNT: 2.1 10*9/L (ref 1.5–5.0)
LYMPHOCYTES RELATIVE PERCENT: 25 %
MEAN CORPUSCULAR HEMOGLOBIN CONC: 32.5 g/dL (ref 31.0–37.0)
MEAN CORPUSCULAR HEMOGLOBIN: 33.8 pg (ref 26.0–34.0)
MEAN PLATELET VOLUME: 8.8 fL (ref 7.0–10.0)
MONOCYTES ABSOLUTE COUNT: 0.7 10*9/L (ref 0.2–0.8)
MONOCYTES RELATIVE PERCENT: 7.9 %
NEUTROPHILS ABSOLUTE COUNT: 4.9 10*9/L (ref 2.0–7.5)
NEUTROPHILS RELATIVE PERCENT: 58.3 %
PLATELET COUNT: 248 10*9/L (ref 150–440)
RED BLOOD CELL COUNT: 4.11 10*12/L — ABNORMAL LOW (ref 4.50–5.90)
WBC ADJUSTED: 8.4 10*9/L (ref 4.5–11.0)

## 2019-09-16 LAB — CREATININE
CREATININE: 0.97 mg/dL (ref 0.70–1.30)
Creatinine:MCnc:Pt:Ser/Plas:Qn:: 0.97
EGFR CKD-EPI NON-AA MALE: 72 mL/min/{1.73_m2} (ref >=60)

## 2019-09-16 LAB — C-REACTIVE PROTEIN: C reactive protein:MCnc:Pt:Ser/Plas:Qn:: <5

## 2019-09-16 LAB — LARGE UNSTAINED CELLS: Lab: 2

## 2019-09-16 LAB — ALT (SGPT): Alanine aminotransferase:CCnc:Pt:Ser/Plas:Qn:: 26

## 2019-09-16 LAB — AST (SGOT): Aspartate aminotransferase:CCnc:Pt:Ser/Plas:Qn:: 34

## 2019-09-16 LAB — ERYTHROCYTE SEDIMENTATION RATE: Lab: 2

## 2019-09-16 LAB — BLOOD UREA NITROGEN: Urea nitrogen:MCnc:Pt:Ser/Plas:Qn:: 18

## 2019-09-16 NOTE — Unmapped (Signed)
Patient AWV scheduled for 09/22/2019 with [facility]    Abstraction Result Flowsheet Data    This patient's last AWV date: [facility] Last Medicare Wellness Visit Date: Not Found  This patients last WCC/CPE date: : Not Found    Reason for Encounter  Reason for Encounter: Outreach  Primary Reason for Call: AWV  Outreach Call Outcome: Scheduled  Text Message: No

## 2019-09-17 NOTE — Unmapped (Signed)
P/C requesting refill on Percocet , last ordered on 08/19/2019, last visit 08/05/2019. ??Contract signed 01/01/2019,UDS obtained on 04/11/17 and was appropriate.

## 2019-09-18 MED ORDER — OXYCODONE-ACETAMINOPHEN 5 MG-325 MG TABLET
ORAL_TABLET | ORAL | 0 refills | 20 days | Status: CP | PRN
Start: 2019-09-18 — End: 2019-10-18

## 2019-09-22 NOTE — Unmapped (Signed)
Contacted patient for phone AWV, pt thought he was scheduled with his PCP this morning at 8am, advised patient that the appointment time was actually with CM, pt frustrated that visit is not with his PCP and expressed concern over PCP retirement next month, questions who will manage his pain medication prescription when it runs out next month, assured pt that PCP will be communicating pt needs to other providers in the clinic prior to his departure, pt also reports that he is off of his advair due to affordability, CM advised that efforts could be made to determine if a cheaper alternative could be prescribed and PCP notified that pt is currently not taking the Advair, CM will contact patient back.

## 2019-10-12 DIAGNOSIS — G8929 Other chronic pain: Principal | ICD-10-CM

## 2019-10-12 DIAGNOSIS — M5440 Lumbago with sciatica, unspecified side: Principal | ICD-10-CM

## 2019-10-12 MED ORDER — OXYCODONE-ACETAMINOPHEN 5 MG-325 MG TABLET: 1 | tablet | 0 refills | 20 days | Status: AC

## 2019-10-12 NOTE — Unmapped (Signed)
Assessment/Plan:    Erik [name] was seen today for follow-up.    Diagnoses and all orders for this visit:    Tobacco abuse    Chronic bilateral low back pain with sciatica, sciatica laterality unspecified  -     Toxicology Screen, Urine  -     oxyCODONE-acetaminophen (PERCOCET) 5-325 mg per tablet; Take 1 tablet by mouth every four (4) hours as needed for pain. DO NOT FILL UNTIL 10/18/2019.    DDD (degenerative disc disease), lumbar  -     oxyCODONE-acetaminophen (PERCOCET) 5-325 mg per tablet; Take 1 tablet by mouth every four (4) hours as needed for pain. DO NOT FILL UNTIL 10/18/2019.    Essential hypertension, benign    Coronary artery disease involving autologous vein coronary bypass graft without angina pectoris    Giant cell arteritis (CMS-HCC)    Dyslipidemia  -     Lipid Panel    Chronic obstructive pulmonary disease, unspecified COPD type (CMS-HCC)  -     albuterol HFA 90 mcg/actuation inhaler; Inhale 2 puffs every six (6) hours as needed for wheezing.    - chronic low back pain in the setting of lumbar DDD:  He insist on keeping his percocet Rx at 120 tabs a month. Review of PDMP reflects compliance. He is due for annual UDS today. His control substance contract was renewed in 12/2018.  Percocet Rx refilled today to dispense on 10/17/2019.  - COPD history: pt continue to smoke occasionally.  He has no chronic SOB symptoms.   Most inhalers prescribed have been to costly for the pt.  He is given Rx for albuterol inhaler to start for prn/rescue purposes. He is advised on not smoking cigarettes.  - history of Giant Cell arteritis: he will continue follow up with his rheumatologist as scheduled along with medication regimen as directed.   - HTN/CAD/Dyslipidemia history: pt remains normotensive and chest pain free on current medication regimen. Will check fasting lipid panel today. He had recent normal electrolytes, creatinine and LFT's last month.       Return in about 3 months (around 01/15/2020) for Recheck. Subjective:     HPI  The patient is seen today for follow-up visit and to establish with a new PCP.  His PCP has retired and he had a telemedicine encounter with him in August/2020.  His HPI is as follows:  Essential hypertension, benign  Coronary artery disease  Chronic bilateral low back pain with sciatica, sciatica laterality unspecified  Chronic pain syndrome  Chronic obstructive pulmonary disease  Giant Cell arteritis  Alcohol abuse    -Chronic pain syndrome/chronic low back pain in the setting of multilevel lumbar DDD confirmed on imaging in December/2018.  Patient has been on scheduled Percocet regimen.  He receives 120 Percocet tablets per month and review of PDMP reflects medication compliance.  He does not have a current UDS . His  controlled substance contract agreement was updated in 12/2018.  He has a history of GI bleed in the setting of duodenal ulcer and NSAIDs are highly discouraged. The pt has been on percocet for over a decade.   -Essential hypertension/CAD history (s/p CABG)/dyslipidemia: Patient has remained normotensive on his current medication regimen.  He is on statin Rx as directed.  He had normal serum creatinine level, normal LFTs ,1 month ago.  He has not had a lipid panel in several years.  -COPD: Patient reports Advair is too costly. He seldom has SOB.    -Biopsy confirmed giant cell arteritis complicated  by ischemic optic neuropathy and loss of vision in the left eye in 2018: Patient followed by [facility] rheumatology clinic.  Last visit was in August/2020.  He continues on Tocilzumab 162 subcutaneous injection weekly with consideration to taper to every 2 weeks on follow-up visit.  -History of alcohol abuse: Recent LFTs, 1 month ago were within normal limits: he had not had an alcohol issue for many years but he continues to drink 3 beers a day with no difficulties.       ROS  Constitutional:  Denies  unexpected weight loss or gain, or weakness   Eyes:  Denies visual changes Respiratory:  Denies cough or shortness of breath. No change in exercise  tolerance  Cardiovascular:  Denies chest pain, palpitations or lower extremity swelling   GI:  Denies abdominal pain, diarrhea, constipation   Musculoskeletal: History as noted above  Skin:  Denies nonhealing lesions  Neurologic:  Denies headache, focal weakness or numbness, tingling  Endocrine:  Denies polyuria or polydypsia   Psychiatric:  Denies depression, anxiety      Outpatient Medications Prior to Visit   Medication Sig Dispense Refill   ??? amLODIPine (NORVASC) 10 MG tablet Take 1 tablet (10 mg total) by mouth daily. 90 tablet 3   ??? ascorbic acid (VITAMIN C) 500 MG tablet Take 500 mg by mouth daily.     ??? aspirin (ECOTRIN) 81 MG tablet Take 81 mg by mouth daily.     ??? atenoloL (TENORMIN) 50 MG tablet Take 1 tablet (50 mg total) by mouth Two (2) times a day. 180 tablet 3   ??? cyanocobalamin (VITAMIN B-12) 1000 MCG tablet Take 1,000 mcg by mouth daily.     ??? fluticasone propion-salmeteroL (ADVAIR DISKUS) 250-50 mcg/dose diskus Inhale 1 puff Two (2) times a day. 60 each 11   ??? hydrALAZINE (APRESOLINE) 25 MG tablet Take 1 tablet (25 mg total) by mouth daily. 90 tablet 11   ??? losartan (COZAAR) 100 MG tablet Take 1 tablet (100 mg total) by mouth daily. 90 tablet 3   ??? multivitamin (TAB-A-VITE/THERAGRAN) per tablet Take 1 tablet by mouth daily.     ??? simvastatin (ZOCOR) 10 MG tablet Take 1 tablet (10 mg total) by mouth daily. 90 tablet 3   ??? tocilizumab (ACTEMRA) 162 mg/0.9 mL Syrg subcutaneous injection Inject 0.9 mL (162 mg total) under the skin every seven (7) days. 4 mL 3   ??? [START ON 10/18/2019] oxyCODONE-acetaminophen (PERCOCET) 5-325 mg per tablet Take 1 tablet by mouth every four (4) hours as needed for pain. DO NOT FILL UNTIL 10/18/2019. 120 tablet 0     No facility-administered medications prior to visit.          Objective: 45-minute patient encounter greater than 50% counseling coronation care and previsit and post visit documentation/planning  Vital Signs  BP 130/64  - Pulse 63  - Temp 36.4 ??C (97.5 ??F)  - Ht 152.4 cm (5')  - Wt 63 kg (139 lb)  - SpO2 98%  - BMI 27.15 kg/m??      Exam  General: normal appearance  EYES: Anicteric sclerae.  ENT: Oropharynx moist.  RESP: Relaxed respiratory effort. Clear to auscultation without wheezes or crackles.   CV: Regular rate and rhythm. Normal S1 and S2. No murmurs or gallops.  No lower extremity edema. Posterior tibial pulses are 2+ and symmetric.  abd exam: non tender, no masses, no HSM   MSK: No focal muscle tenderness. Thoracic kyphosis noted  SKIN: Appropriately warm  and moist.  NEURO: Stable gait and coordination.    Allergies:     Patient has no known allergies.    Current Medications:     Current Outpatient Medications   Medication Sig Dispense Refill   ??? amLODIPine (NORVASC) 10 MG tablet Take 1 tablet (10 mg total) by mouth daily. 90 tablet 3   ??? ascorbic acid (VITAMIN C) 500 MG tablet Take 500 mg by mouth daily.     ??? aspirin (ECOTRIN) 81 MG tablet Take 81 mg by mouth daily.     ??? atenoloL (TENORMIN) 50 MG tablet Take 1 tablet (50 mg total) by mouth Two (2) times a day. 180 tablet 3   ??? cyanocobalamin (VITAMIN B-12) 1000 MCG tablet Take 1,000 mcg by mouth daily.     ??? fluticasone propion-salmeteroL (ADVAIR DISKUS) 250-50 mcg/dose diskus Inhale 1 puff Two (2) times a day. 60 each 11   ??? hydrALAZINE (APRESOLINE) 25 MG tablet Take 1 tablet (25 mg total) by mouth daily. 90 tablet 11   ??? losartan (COZAAR) 100 MG tablet Take 1 tablet (100 mg total) by mouth daily. 90 tablet 3   ??? multivitamin (TAB-A-VITE/THERAGRAN) per tablet Take 1 tablet by mouth daily.     ??? [START ON 10/18/2019] oxyCODONE-acetaminophen (PERCOCET) 5-325 mg per tablet Take 1 tablet by mouth every four (4) hours as needed for pain. DO NOT FILL UNTIL 10/18/2019. 120 tablet 0 ??? simvastatin (ZOCOR) 10 MG tablet Take 1 tablet (10 mg total) by mouth daily. 90 tablet 3   ??? tocilizumab (ACTEMRA) 162 mg/0.9 mL Syrg subcutaneous injection Inject 0.9 mL (162 mg total) under the skin every seven (7) days. 4 mL 3   ??? albuterol HFA 90 mcg/actuation inhaler Inhale 2 puffs every six (6) hours as needed for wheezing. 1 Inhaler 5     No current facility-administered medications for this visit.            Note - This record has been created using [employer]. Chart creation errors have been sought, but may not always have been located. Such creation errors do not reflect on the standard of medical care.    Jenell Milliner, MD

## 2019-10-12 NOTE — Unmapped (Addendum)
Please call and schedule appointment with another provider to discuss pain management      I will refill Percocet prescription for 1 month only.  As noted above, the patient will need to schedule an appointment with me to discuss his chronic pain management regimen.  On review of [place] controlled substance registry he remains compliant with his Percocet regimen but he has not had a recent UDS or controlled substance contract agreement.  He does have a history of chronic pain syndrome with chronic low back pain due to lumbar degenerative disc disease.  Imaging done in 2018 revealed the following:  -Multilevel degenerative disc disease with severe disc narrowing at L2-L3 and L3-L4.

## 2019-10-12 NOTE — Unmapped (Signed)
P/C requesting refill on Percocet , last ordered on 09/16/2019, last visit 08/05/2019. Contract signed 01/01/2019,UDS obtained on 04/11/17 and was appropriate.

## 2019-10-15 ENCOUNTER — Encounter: Admit: 2019-10-15 | Discharge: 2019-10-16 | Payer: MEDICARE

## 2019-10-15 DIAGNOSIS — Z720 Tobacco use: Principal | ICD-10-CM

## 2019-10-15 DIAGNOSIS — E785 Hyperlipidemia, unspecified: Principal | ICD-10-CM

## 2019-10-15 DIAGNOSIS — I10 Essential (primary) hypertension: Principal | ICD-10-CM

## 2019-10-15 DIAGNOSIS — G8929 Other chronic pain: Principal | ICD-10-CM

## 2019-10-15 DIAGNOSIS — M5136 Other intervertebral disc degeneration, lumbar region: Principal | ICD-10-CM

## 2019-10-15 DIAGNOSIS — M316 Other giant cell arteritis: Principal | ICD-10-CM

## 2019-10-15 DIAGNOSIS — I25810 Atherosclerosis of coronary artery bypass graft(s) without angina pectoris: Principal | ICD-10-CM

## 2019-10-15 DIAGNOSIS — Z79899 Other long term (current) drug therapy: Principal | ICD-10-CM

## 2019-10-15 DIAGNOSIS — J449 Chronic obstructive pulmonary disease, unspecified: Principal | ICD-10-CM

## 2019-10-15 DIAGNOSIS — M5440 Lumbago with sciatica, unspecified side: Principal | ICD-10-CM

## 2019-10-15 LAB — LIPID PANEL
CHOLESTEROL: 144 mg/dL (ref 100–199)
LDL CHOLESTEROL CALCULATED: 77 mg/dL (ref 60–99)
NON-HDL CHOLESTEROL: 93 mg/dL
TRIGLYCERIDES: 78 mg/dL (ref 1–149)
VLDL CHOLESTEROL CAL: 15.6 mg/dL (ref 12–42)

## 2019-10-15 LAB — TOXICOLOGY SCREEN, URINE
AMPHETAMINE SCREEN URINE: <500
BARBITURATE SCREEN URINE: <200
METHADONE SCREEN, URINE: <300
OPIATE SCREEN URINE: <300

## 2019-10-15 LAB — HDL CHOLESTEROL: Cholesterol.in HDL:MCnc:Pt:Ser/Plas:Qn:: 51

## 2019-10-15 LAB — COCAINE(METAB.)SCREEN, URINE: Lab: <150

## 2019-10-15 MED ORDER — ALBUTEROL SULFATE HFA 90 MCG/ACTUATION AEROSOL INHALER: 2 | Inhaler | Freq: Four times a day (QID) | 5 refills | 0 days | Status: AC

## 2019-10-15 MED ORDER — OXYCODONE-ACETAMINOPHEN 5 MG-325 MG TABLET: 1 | tablet | 0 refills | 20 days | Status: AC

## 2019-10-15 NOTE — Unmapped (Signed)
Addended by: Jenell Milliner on: 10/15/2019 04:55 PM     Modules accepted: Orders

## 2019-10-21 LAB — CODEINE-BY LC-MS/MS: Codeine:MCnc:Pt:Urine:Qn:Confirm: NEGATIVE

## 2019-10-21 LAB — OPIATE, URINE, QUANTITATIVE
CODEINE-BY LC-MS/MS: NEGATIVE ng/mL
HYDROCODONE-BY LC-MS/MS: NEGATIVE ng/mL
HYDROMORPHONE-BY LC-MS/MS: NEGATIVE ng/mL
MORPHINE-BY LC-MS/MS: NEGATIVE ng/mL
NALOXONE-BY LC-MS/MS: NEGATIVE ng/mL
OPIATES INTERPRETATION: POSITIVE

## 2019-10-22 NOTE — Unmapped (Signed)
Further UDS with opioid interpretation reveals positive results consistent with patient's medication use.

## 2019-11-01 MED ORDER — TRIAMCINOLONE ACETONIDE 0.1 % TOPICAL CREAM
Freq: Two times a day (BID) | TOPICAL | 2 refills | 0 days | Status: CP
Start: 2019-11-01 — End: 2020-10-31

## 2019-11-05 NOTE — Unmapped (Signed)
Tocilizumab (Actemra) refill  Last ov: 08/17/2019  Next ov: 02/22/2020     Labs:   AST   Date Value Ref Range Status   09/16/2019 34 19 - 55 U/L Final     ALT   Date Value Ref Range Status   09/16/2019 26 <50 U/L Final     Creatinine Whole Blood, POC   Date Value Ref Range Status   10/11/2017 0.8 0.8 - 1.4 mg/dL Final     Creatinine   Date Value Ref Range Status   09/16/2019 0.97 0.70 - 1.30 mg/dL Final     WBC   Date Value Ref Range Status   09/16/2019 8.4 4.5 - 11.0 10*9/L Final     HGB   Date Value Ref Range Status   09/16/2019 13.9 13.5 - 17.5 g/dL Final     HCT   Date Value Ref Range Status   09/16/2019 42.8 41.0 - 53.0 % Final     MCV   Date Value Ref Range Status   09/16/2019 104.0 (H) 80.0 - 100.0 fL Final     RDW   Date Value Ref Range Status   09/16/2019 13.3 12.0 - 15.0 % Final     Platelet   Date Value Ref Range Status   09/16/2019 248 150 - 440 10*9/L Final     Neutrophils %   Date Value Ref Range Status   09/16/2019 58.3 % Final     Lymphocytes %   Date Value Ref Range Status   09/16/2019 25.0 % Final     Monocytes %   Date Value Ref Range Status   09/16/2019 7.9 % Final     Eosinophils %   Date Value Ref Range Status   09/16/2019 5.8 % Final     Basophils %   Date Value Ref Range Status   09/16/2019 0.6 % Final

## 2019-11-06 MED ORDER — ACTEMRA 162 MG/0.9 ML SUBCUTANEOUS SYRINGE
SUBCUTANEOUS | 3 refills | 28 days | Status: CP
Start: 2019-11-06 — End: ?

## 2019-11-08 DIAGNOSIS — M316 Other giant cell arteritis: Principal | ICD-10-CM

## 2019-11-08 MED ORDER — ACTEMRA 162 MG/0.9 ML SUBCUTANEOUS SYRINGE
SUBCUTANEOUS | 3 refills | 28 days | Status: CP
Start: 2019-11-08 — End: ?

## 2019-11-08 NOTE — Unmapped (Signed)
Reason for call: Pt called in requesting for a call back regarding his medication of ACTEMRA & other concering isuess he is having.     Thanks       Last ov: Visit date not found  Next ov: 02/22/2020

## 2019-11-08 NOTE — Unmapped (Signed)
Called pt and informed RX sent to [facility]. Pt verbalized understanding and appreciation for phone call.

## 2019-11-08 NOTE — Unmapped (Signed)
Addended by: Loreli Slot on: 11/08/2019 11:10 AM     Modules accepted: Orders

## 2019-11-08 NOTE — Unmapped (Signed)
Tocilizumab (Actemra) refill  Last ov:  08/17/2019  Next ov: 02/22/2020     Previous Actemra RX sent to [facility], pt fills with [facility]. Script to updated pharmacy pended.    Labs:   AST   Date Value Ref Range Status   09/16/2019 34 19 - 55 U/L Final     ALT   Date Value Ref Range Status   09/16/2019 26 <50 U/L Final     Creatinine Whole Blood, POC   Date Value Ref Range Status   10/11/2017 0.8 0.8 - 1.4 mg/dL Final     Creatinine   Date Value Ref Range Status   09/16/2019 0.97 0.70 - 1.30 mg/dL Final     WBC   Date Value Ref Range Status   09/16/2019 8.4 4.5 - 11.0 10*9/L Final     HGB   Date Value Ref Range Status   09/16/2019 13.9 13.5 - 17.5 g/dL Final     HCT   Date Value Ref Range Status   09/16/2019 42.8 41.0 - 53.0 % Final     MCV   Date Value Ref Range Status   09/16/2019 104.0 (H) 80.0 - 100.0 fL Final     RDW   Date Value Ref Range Status   09/16/2019 13.3 12.0 - 15.0 % Final     Platelet   Date Value Ref Range Status   09/16/2019 248 150 - 440 10*9/L Final     Neutrophils %   Date Value Ref Range Status   09/16/2019 58.3 % Final     Lymphocytes %   Date Value Ref Range Status   09/16/2019 25.0 % Final     Monocytes %   Date Value Ref Range Status   09/16/2019 7.9 % Final     Eosinophils %   Date Value Ref Range Status   09/16/2019 5.8 % Final     Basophils %   Date Value Ref Range Status   09/16/2019 0.6 % Final

## 2019-11-12 DIAGNOSIS — G8929 Other chronic pain: Principal | ICD-10-CM

## 2019-11-12 DIAGNOSIS — M5440 Lumbago with sciatica, unspecified side: Principal | ICD-10-CM

## 2019-11-12 DIAGNOSIS — M5136 Other intervertebral disc degeneration, lumbar region: Principal | ICD-10-CM

## 2019-11-12 NOTE — Unmapped (Signed)
Patient called in asking for his percocet refill. He is asking for a return call to please let him know if he can get his refill.

## 2019-11-13 NOTE — Unmapped (Signed)
Patient will be due on 11/25. Last refill was 10/18/19. LOV: 10/15/2019-new Contract signed and UDS appropriate       If approved please send to pharmacy selected. sct

## 2019-11-17 MED ORDER — OXYCODONE-ACETAMINOPHEN 5 MG-325 MG TABLET
ORAL_TABLET | ORAL | 0 refills | 20 days | Status: CP | PRN
Start: 2019-11-17 — End: 2019-12-17

## 2019-12-10 DIAGNOSIS — M5440 Lumbago with sciatica, unspecified side: Principal | ICD-10-CM

## 2019-12-10 DIAGNOSIS — G8929 Other chronic pain: Principal | ICD-10-CM

## 2019-12-10 DIAGNOSIS — M5136 Other intervertebral disc degeneration, lumbar region: Principal | ICD-10-CM

## 2019-12-10 NOTE — Unmapped (Addendum)
P/C requesting refill on Percocet , last ordered on 11/17/2019, last visit 10/15/2019. Controlled Substance Contract signed on 01/01/2019. UDS obtained on 10/15/2019 and was negative.

## 2019-12-10 NOTE — Unmapped (Signed)
Addended by: Barbaraann Boys on: 12/10/2019 02:51 PM     Modules accepted: Orders

## 2019-12-10 NOTE — Unmapped (Signed)
Pt called to request monthly refill on:    oxyCODONE-acetaminophen (PERCOCET) 5-325 mg     Pt states that [facility] will be closed on 12/25 so the RX needs to be sent on 12/24.  Pt asked that he get a phone call to let him know that this will happen because he does not drive and depends on friends and family to take him to the drugstore.

## 2019-12-16 MED ORDER — OXYCODONE-ACETAMINOPHEN 5 MG-325 MG TABLET
ORAL_TABLET | ORAL | 0 refills | 20.00000 days | Status: CP | PRN
Start: 2019-12-16 — End: 2020-01-16

## 2020-01-05 NOTE — Unmapped (Addendum)
Assessment/Plan:    Erik [name] was seen today for follow-up.    Diagnoses and all orders for this visit:    Chronic obstructive pulmonary disease, unspecified COPD type (CMS-HCC)    Chronic bilateral low back pain with sciatica, sciatica laterality unspecified  -     oxyCODONE-acetaminophen (PERCOCET) 5-325 mg per tablet; Take 1 tablet by mouth every four (4) hours as needed for pain. DO NOT FILL UNTIL 12/16/2019    DDD (degenerative disc disease), lumbar  -     oxyCODONE-acetaminophen (PERCOCET) 5-325 mg per tablet; Take 1 tablet by mouth every four (4) hours as needed for pain. DO NOT FILL UNTIL 12/16/2019    Essential hypertension, benign    Tobacco abuse    Giant cell arteritis (CMS-HCC)    Coronary artery disease involving autologous vein coronary bypass graft without angina pectoris    Dyslipidemia      - chronic low back pain in the setting of lumbar DDD:  He insist on keeping his percocet Rx at 120 tabs a month. Review of PDMP reflects compliance.  Annual UDS done on last visit confirmed presence of opioid and his annual controlled substance contract was renewed at that time. He is given percocet Rx today.   - COPD history: pt continue to smoke occasionally.  He has no chronic SOB symptoms.   Most inhalers prescribed have been to costly for the pt.  He is given Rx for albuterol inhaler to start for prn/rescue purposes. He is advised on not smoking cigarettes. He uses the rescue inhaler about twice a day.   He wishes to continue with this regimen since other inhalers attempted in the past are very expensive. Inhaler education done today.   - history of Giant Cell arteritis: he will continue follow up with his rheumatologist as scheduled along with medication regimen as directed. He self administers weekly Actemra injections and he has a rheumatology follow up visit for 02/2020. - HTN/CAD/Dyslipidemia history: pt remains normotensive and chest pain free on current medication regimen.  He had  normal electrolytes, creatinine and LFT's in 08/2019.  Lipid panel done in October/2020 revealed HDL/LDL of 51/77. His home BP readings are always normal.    Return in about 3 months (around 04/10/2020), or 40 min AWV.    Subjective:     HPI  The patient is seen today for 32-month follow-up visit.  He was last seen in October/2020 with the following HPI:  Tobacco abuse  Chronic bilateral low back pain with sciatica  DDD (degenerative disc disease), lumbar  Essential hypertension, benign  Coronary artery disease  Giant cell arteritis  Dyslipidemia  Chronic obstructive pulmonary disease    He was advised and as follows:  - chronic low back pain in the setting of lumbar DDD:  He insist on keeping his percocet Rx at 120 tabs a month. Review of PDMP reflects compliance.  Annual UDS done on last visit confirmed presence of opioid and his annual controlled substance contract was renewed at that time.   - COPD history: pt continue to smoke occasionally.  He has no chronic SOB symptoms.   Most inhalers prescribed have been to costly for the pt.  He is given Rx for albuterol inhaler to start for prn/rescue purposes. He is advised on not smoking cigarettes. He uses the rescue inhaler about twice a day.   He wishes to continue with this regimen since other inhalers attempted in the past are very expensive.   - history of Giant Cell arteritis:  he will continue follow up with his rheumatologist as scheduled along with medication regimen as directed. He self administers weekly Actemra injections and he has a rheumatology follow up visit for 02/2020.  - HTN/CAD/Dyslipidemia history: pt remains normotensive and chest pain free on current medication regimen.  He had  normal electrolytes, creatinine and LFT's in 08/2019.  Lipid panel done in October/2020 revealed HDL/LDL of 51/77. His home BP readings are always normal. ROS  Constitutional:  Denies  unexpected weight loss or gain, or weakness   Eyes:  Denies visual changes  Respiratory:  Denies cough or shortness of breath. No change in exercise  tolerance  Cardiovascular:  Denies chest pain, palpitations or lower extremity swelling   GI:  Denies abdominal pain, diarrhea, constipation   Musculoskeletal:  Denies myalgias; chronic low back pain  Skin:  Denies nonhealing lesions  Neurologic:  Denies headache, focal weakness or numbness, tingling  Endocrine:  Denies polyuria or polydypsia   Psychiatric:  Denies depression, anxiety      Outpatient Medications Prior to Visit   Medication Sig Dispense Refill   ??? albuterol HFA 90 mcg/actuation inhaler Inhale 2 puffs every six (6) hours as needed for wheezing. 1 Inhaler 5   ??? amLODIPine (NORVASC) 10 MG tablet Take 1 tablet (10 mg total) by mouth daily. 90 tablet 3   ??? ascorbic acid (VITAMIN C) 500 MG tablet Take 500 mg by mouth daily.     ??? aspirin (ECOTRIN) 81 MG tablet Take 81 mg by mouth daily.     ??? atenoloL (TENORMIN) 50 MG tablet Take 1 tablet (50 mg total) by mouth Two (2) times a day. 180 tablet 3   ??? cyanocobalamin (VITAMIN B-12) 1000 MCG tablet Take 1,000 mcg by mouth daily.     ??? hydrALAZINE (APRESOLINE) 25 MG tablet Take 1 tablet (25 mg total) by mouth daily. 90 tablet 11   ??? losartan (COZAAR) 100 MG tablet Take 1 tablet (100 mg total) by mouth daily. 90 tablet 3   ??? multivitamin (TAB-A-VITE/THERAGRAN) per tablet Take 1 tablet by mouth daily.     ??? simvastatin (ZOCOR) 10 MG tablet Take 1 tablet (10 mg total) by mouth daily. 90 tablet 3   ??? tocilizumab (ACTEMRA) 162 mg/0.9 mL Syrg subcutaneous injection Inject 0.9 mL (162 mg total) under the skin every seven (7) days. 4 mL 3   ??? triamcinolone (KENALOG) 0.1 % cream Apply topically Two (2) times a day. 80 g 2   ??? fluticasone propion-salmeteroL (ADVAIR DISKUS) 250-50 mcg/dose diskus Inhale 1 puff Two (2) times a day. 60 each 11 ??? oxyCODONE-acetaminophen (PERCOCET) 5-325 mg per tablet Take 1 tablet by mouth every four (4) hours as needed for pain. DO NOT FILL UNTIL 12/16/2019 120 tablet 0     No facility-administered medications prior to visit.          Objective:       Vital Signs  BP 142/60  - Pulse 68  - Temp 36.6 ??C (97.9 ??F) (Temporal)  - Wt 65.8 kg (145 lb)  - SpO2 98%  - BMI 28.32 kg/m??      Exam  General: normal appearance  EYES: Anicteric sclerae.  ENT: Oropharynx moist.  RESP: Relaxed respiratory effort. Clear to auscultation without wheezes or crackles.   CV: Regular rate and rhythm. Normal S1 and S2. No murmurs or gallops.  No lower extremity edema. Posterior tibial pulses are 2+ and symmetric.  abd exam: non tender, no masses, no HSM   MSK:  No focal muscle tenderness.  SKIN: Appropriately warm and moist.  NEURO: Stable gait and coordination.    Allergies:     Patient has no known allergies.    Current Medications:     Current Outpatient Medications   Medication Sig Dispense Refill   ??? albuterol HFA 90 mcg/actuation inhaler Inhale 2 puffs every six (6) hours as needed for wheezing. 1 Inhaler 5   ??? amLODIPine (NORVASC) 10 MG tablet Take 1 tablet (10 mg total) by mouth daily. 90 tablet 3   ??? ascorbic acid (VITAMIN C) 500 MG tablet Take 500 mg by mouth daily.     ??? aspirin (ECOTRIN) 81 MG tablet Take 81 mg by mouth daily.     ??? atenoloL (TENORMIN) 50 MG tablet Take 1 tablet (50 mg total) by mouth Two (2) times a day. 180 tablet 3   ??? cyanocobalamin (VITAMIN B-12) 1000 MCG tablet Take 1,000 mcg by mouth daily.     ??? hydrALAZINE (APRESOLINE) 25 MG tablet Take 1 tablet (25 mg total) by mouth daily. 90 tablet 11   ??? losartan (COZAAR) 100 MG tablet Take 1 tablet (100 mg total) by mouth daily. 90 tablet 3   ??? multivitamin (TAB-A-VITE/THERAGRAN) per tablet Take 1 tablet by mouth daily. ??? [START ON 01/14/2020] oxyCODONE-acetaminophen (PERCOCET) 5-325 mg per tablet Take 1 tablet by mouth every four (4) hours as needed for pain. DO NOT FILL UNTIL 12/16/2019 120 tablet 0   ??? simvastatin (ZOCOR) 10 MG tablet Take 1 tablet (10 mg total) by mouth daily. 90 tablet 3   ??? tocilizumab (ACTEMRA) 162 mg/0.9 mL Syrg subcutaneous injection Inject 0.9 mL (162 mg total) under the skin every seven (7) days. 4 mL 3   ??? triamcinolone (KENALOG) 0.1 % cream Apply topically Two (2) times a day. 80 g 2     No current facility-administered medications for this visit.            Note - This record has been created using [employer]. Chart creation errors have been sought, but may not always have been located. Such creation errors do not reflect on the standard of medical care.    Jenell Milliner, MD

## 2020-01-11 ENCOUNTER — Encounter: Admit: 2020-01-11 | Discharge: 2020-01-12 | Payer: MEDICARE

## 2020-01-11 DIAGNOSIS — M5440 Lumbago with sciatica, unspecified side: Principal | ICD-10-CM

## 2020-01-11 DIAGNOSIS — G8929 Other chronic pain: Secondary | ICD-10-CM

## 2020-01-11 DIAGNOSIS — J449 Chronic obstructive pulmonary disease, unspecified: Principal | ICD-10-CM

## 2020-01-11 DIAGNOSIS — M5136 Other intervertebral disc degeneration, lumbar region: Principal | ICD-10-CM

## 2020-01-11 DIAGNOSIS — E785 Hyperlipidemia, unspecified: Principal | ICD-10-CM

## 2020-01-11 DIAGNOSIS — Z720 Tobacco use: Principal | ICD-10-CM

## 2020-01-11 DIAGNOSIS — M316 Other giant cell arteritis: Principal | ICD-10-CM

## 2020-01-11 DIAGNOSIS — I10 Essential (primary) hypertension: Principal | ICD-10-CM

## 2020-01-11 DIAGNOSIS — I25810 Atherosclerosis of coronary artery bypass graft(s) without angina pectoris: Principal | ICD-10-CM

## 2020-01-11 NOTE — Unmapped (Signed)
Addended by: Jenell Milliner on: 01/11/2020 10:03 AM     Modules accepted: Level of Service

## 2020-01-14 MED ORDER — OXYCODONE-ACETAMINOPHEN 5 MG-325 MG TABLET
ORAL_TABLET | ORAL | 0 refills | 20 days | Status: CP | PRN
Start: 2020-01-14 — End: 2020-02-14

## 2020-01-29 DIAGNOSIS — I10 Essential (primary) hypertension: Principal | ICD-10-CM

## 2020-01-31 MED ORDER — LOSARTAN 100 MG TABLET
ORAL_TABLET | Freq: Every day | ORAL | 1 refills | 90.00000 days | Status: CP
Start: 2020-01-31 — End: 2021-01-30

## 2020-01-31 MED ORDER — ATENOLOL 50 MG TABLET
ORAL_TABLET | Freq: Two times a day (BID) | ORAL | 1 refills | 90.00000 days | Status: CP
Start: 2020-01-31 — End: 2021-01-30

## 2020-02-09 DIAGNOSIS — M5440 Lumbago with sciatica, unspecified side: Principal | ICD-10-CM

## 2020-02-09 DIAGNOSIS — M5136 Other intervertebral disc degeneration, lumbar region: Principal | ICD-10-CM

## 2020-02-09 DIAGNOSIS — G8929 Other chronic pain: Principal | ICD-10-CM

## 2020-02-09 NOTE — Unmapped (Signed)
P/C requesting refill on Percocet , last ordered on 01/14/2020, last visit 01/11/2020. Controlled Substance Contract signed on 01/01/2019. UDS obtained on 10/15/2019 and was appropriate      .

## 2020-02-13 MED ORDER — OXYCODONE-ACETAMINOPHEN 5 MG-325 MG TABLET
ORAL_TABLET | ORAL | 0 refills | 20 days | Status: CP | PRN
Start: 2020-02-13 — End: 2020-03-14

## 2020-02-22 ENCOUNTER — Encounter: Admit: 2020-02-22 | Discharge: 2020-02-23 | Payer: MEDICARE

## 2020-02-22 DIAGNOSIS — R39198 Other difficulties with micturition: Principal | ICD-10-CM

## 2020-02-22 DIAGNOSIS — R109 Unspecified abdominal pain: Principal | ICD-10-CM

## 2020-02-22 MED ORDER — CIPROFLOXACIN 250 MG TABLET
ORAL_TABLET | Freq: Two times a day (BID) | ORAL | 0 refills | 7 days | Status: CP
Start: 2020-02-22 — End: 2020-02-29

## 2020-02-22 MED ORDER — TAMSULOSIN 0.4 MG CAPSULE
ORAL_CAPSULE | 11 refills | 0 days | Status: CP
Start: 2020-02-22 — End: ?

## 2020-02-22 MED ORDER — ACTEMRA 162 MG/0.9 ML SUBCUTANEOUS SYRINGE
SUBCUTANEOUS | 3 refills | 56 days | Status: CP
Start: 2020-02-22 — End: ?

## 2020-02-22 NOTE — Unmapped (Signed)
I spent 15 minutes on the phone with the patient on the date of service. I spent an additional 5 minutes on pre- and post-visit activities.     The patient was physically located in [place] or a state in which I am permitted to provide care. The patient and/or parent/guardian understood that s/he may incur co-pays and cost sharing, and agreed to the telemedicine visit. The visit was reasonable and appropriate under the circumstances given the patient's presentation at the time.    The patient and/or parent/guardian has been advised of the potential risks and limitations of this mode of treatment (including, but not limited to, the absence of in-person examination) and has agreed to be treated using telemedicine. The patient's/patient's family's questions regarding telemedicine have been answered.     If the visit was completed in an ambulatory setting, the patient and/or parent/guardian has also been advised to contact their provider???s office for worsening conditions, and seek emergency medical treatment and/or call 911 if the patient deems either necessary.      Erik [name] is a 86 y.o. male  is here for   Chief Complaint   Patient presents with   ??? Telemedicine/telephone visit   ??? Back Pain       Assessment/Plan:    Erik [name] was seen today for telemedicine/telephone visit and back pain.    Diagnoses and all orders for this visit:    Left flank pain  -     ciprofloxacin HCl (CIPRO) 250 MG tablet; Take 1 tablet (250 mg total) by mouth Two (2) times a day for 7 days.    Slow urinary stream  -     tamsulosin (FLOMAX) 0.4 mg capsule; 1 capsules qhs      Left flank pain: almost gone presently but he is having strong odor to urine: pt started on cipro 250 mg bid for 7 days.  Presumptive BPH:  History of slow urinary flow- pt started on Flomax 0.4 mg at bedtime.    Return if symptoms worsen or fail to improve; she has routine follow-up visit in June/2021..    Subjective:     Telephone visit conducted today in light of current COVID-19 outbreak and social distancing is encouraged.    HPI-     Reason for encounter today is acute lower left back/flank pain which is very different from his chronic Low back pain due to DDD.  Pain is worsens with coughing or palpation of area. He is having no constipation issues.  His urination is described as slow but urine smells strong and is dark in color.  He has had normal flatus and normal brown stools. The pt has had no fever and no nausea or vomiting.  His appetite is good. He had hospitalization in 11/2017 for melena. The pt states the pain is currently markedly improved and almost gone: and he denies history of kidney stones.        I last saw the pt in 12/2019 for routine visit for the following HPI:  Chronic obstructive pulmonary disease  Chronic bilateral low back pain with sciatica, ??  DDD (degenerative disc disease), lumbar  Essential hypertension, benign  Tobacco abuse  Giant cell arteritis (CMS-HCC)  Coronary artery disease   Dyslipidemia  ??  ??He was advised then as follows:  - chronic low back pain in the setting of lumbar DDD:  He insisted on keeping his percocet Rx at 120 tabs a month. Review of PDMP reflects compliance.  Annual UDS done  in 09/2019, confirmed presence of opioid and his annual controlled substance contract was renewed at that time. His last percocet Rx was dispensed on 02/13/2020.     - COPD history: pt continue to smoke occasionally.  He has no chronic SOB symptoms.   Most inhalers prescribed have been to costly for the pt.  He is given Rx for albuterol inhaler to start for prn/rescue purposes. He is advised on not smoking cigarettes. He uses the rescue inhaler about twice a day.   He wishes to continue with this regimen since other inhalers attempted in the past are very expensive. Inhaler education done today.   - history of Giant Cell arteritis: he will continue follow up with his rheumatologist as scheduled along with medication regimen as directed. He self administers weekly Actemra injections and he has a rheumatology follow up visit for 02/2020.  - HTN/CAD/Dyslipidemia history: pt remains normotensive and chest pain free on current medication regimen.  He had  normal electrolytes, creatinine and LFT's in 08/2019.  Lipid panel done in October/2020 revealed HDL/LDL of 51/77. His home BP readings are always normal.      ROS  Constitutional:  Denies  unexpected weight loss or gain, or weakness   Eyes:  Denies visual changes  Respiratory:  Denies cough or shortness of breath. No change in exercise  tolerance  Cardiovascular:  Denies chest pain, palpitations or lower extremity swelling   GI:  Denies abdominal pain, diarrhea, constipation   Musculoskeletal:  Denies myalgias; history as noted above''  Skin:  Denies nonhealing lesions  Neurologic:  Denies headache, focal weakness or numbness, tingling  Endocrine:  Denies polyuria or polydypsia   Psychiatric:  Denies depression, anxiety      Outpatient Medications Prior to Visit   Medication Sig Dispense Refill   ??? albuterol HFA 90 mcg/actuation inhaler Inhale 2 puffs every six (6) hours as needed for wheezing. 1 Inhaler 5   ??? amLODIPine (NORVASC) 10 MG tablet Take 1 tablet (10 mg total) by mouth daily. 90 tablet 3   ??? ascorbic acid (VITAMIN C) 500 MG tablet Take 500 mg by mouth daily.     ??? aspirin (ECOTRIN) 81 MG tablet Take 81 mg by mouth daily.     ??? atenoloL (TENORMIN) 50 MG tablet Take 1 tablet (50 mg total) by mouth Two (2) times a day. 180 tablet 1   ??? cyanocobalamin (VITAMIN B-12) 1000 MCG tablet Take 1,000 mcg by mouth daily.     ??? hydrALAZINE (APRESOLINE) 25 MG tablet Take 1 tablet (25 mg total) by mouth daily. 90 tablet 11   ??? losartan (COZAAR) 100 MG tablet Take 1 tablet (100 mg total) by mouth daily. 90 tablet 1   ??? multivitamin (TAB-A-VITE/THERAGRAN) per tablet Take 1 tablet by mouth daily.     ??? oxyCODONE-acetaminophen (PERCOCET) 5-325 mg per tablet Take 1 tablet by mouth every four (4) hours as needed for pain. DO NOT FILL UNTIL 02/13/2020 120 tablet 0   ??? simvastatin (ZOCOR) 10 MG tablet Take 1 tablet (10 mg total) by mouth daily. 90 tablet 3   ??? tocilizumab (ACTEMRA) 162 mg/0.9 mL Syrg subcutaneous injection Inject 0.9 mL (162 mg total) under the skin every seven (7) days. 4 mL 3   ??? triamcinolone (KENALOG) 0.1 % cream Apply topically Two (2) times a day. 80 g 2     No facility-administered medications prior to visit.          Objective:     Total time  spent on telephone visit: 15 minutes  Total time spent on pre and post visit planning: 5 minutes    Allergies:     Patient has no known allergies.    Current Medications:     Current Outpatient Medications   Medication Sig Dispense Refill   ??? albuterol HFA 90 mcg/actuation inhaler Inhale 2 puffs every six (6) hours as needed for wheezing. 1 Inhaler 5   ??? amLODIPine (NORVASC) 10 MG tablet Take 1 tablet (10 mg total) by mouth daily. 90 tablet 3   ??? ascorbic acid (VITAMIN C) 500 MG tablet Take 500 mg by mouth daily.     ??? aspirin (ECOTRIN) 81 MG tablet Take 81 mg by mouth daily.     ??? atenoloL (TENORMIN) 50 MG tablet Take 1 tablet (50 mg total) by mouth Two (2) times a day. 180 tablet 1   ??? cyanocobalamin (VITAMIN B-12) 1000 MCG tablet Take 1,000 mcg by mouth daily.     ??? hydrALAZINE (APRESOLINE) 25 MG tablet Take 1 tablet (25 mg total) by mouth daily. 90 tablet 11   ??? losartan (COZAAR) 100 MG tablet Take 1 tablet (100 mg total) by mouth daily. 90 tablet 1   ??? multivitamin (TAB-A-VITE/THERAGRAN) per tablet Take 1 tablet by mouth daily.     ??? oxyCODONE-acetaminophen (PERCOCET) 5-325 mg per tablet Take 1 tablet by mouth every four (4) hours as needed for pain. DO NOT FILL UNTIL 02/13/2020 120 tablet 0   ??? simvastatin (ZOCOR) 10 MG tablet Take 1 tablet (10 mg total) by mouth daily. 90 tablet 3   ??? tocilizumab (ACTEMRA) 162 mg/0.9 mL Syrg subcutaneous injection Inject 0.9 mL (162 mg total) under the skin every seven (7) days. 4 mL 3   ??? triamcinolone (KENALOG) 0.1 % cream Apply topically Two (2) times a day. 80 g 2   ??? ciprofloxacin HCl (CIPRO) 250 MG tablet Take 1 tablet (250 mg total) by mouth Two (2) times a day for 7 days. 14 tablet 0   ??? tamsulosin (FLOMAX) 0.4 mg capsule 1 capsules qhs 30 capsule 11     No current facility-administered medications for this visit.            Note - This record has been created using [employer]. Chart creation errors have been sought, but may not always have been located. Such creation errors do not reflect on the standard of medical care.    Jenell Milliner, MD

## 2020-02-23 NOTE — Unmapped (Signed)
[facility] Telephone Visit Note    Assessment/Recommendations:   86 y.o. smoker with h/o COPD, CAD s/p CABG,  HTN, active alcohol use, GIB 2/2 duodenal ulcer (likely 2/2 concurrent ibuprofen, alcohol and prednisone use) presenting for follow-up of  biopsy proven giant cell arteritis  complicated by ischemic optic neuropathy/ vision loss in left eye (dx 11/2017)    He was last seen 07/2019 as telemedicine visit.     Currently taking tocilizumab 162 mg SQ qweek and tolerating without any side effects. Reports persistent vision loss of left eye. No new vision changes, headaches, temporal artery tenderness, scalp tenderness, jaw pain/claudication, shoulder/hip stiffness, fevers, night sweat, weight loss, excessive fatigue of concern. Given clinical stability and being >2 years out from initial diagnosis, will taper tocilizumab to every other week dosing in an attempt to decrease immunosuppression. Counseled patient on warning signs of GCA flare and he is aware he is to contact our office immediately if he should experience these.     - Will decrease tocilizumab from 162 mg subcutaneous qweek --> 162 mg subcutaneous q2weeks given desire to minimize immunosuppression   -  Of note, patient with known h/o GIB/duodenal ulcer which does increase risk of GI perforation on tocilizumab - however, he is now >18 months out from this without any complications and wishes to continue treatment with tocilizumab  - Will bring back for in person evaluation in 4-6 months or sooner if needed  - Discussed recommendation for patient to receive coronavirus vaccine; per ACR guidelines he does not need to adjust tocilizumab dosing for his vaccination    I spent 11 minutes on the phone visit with the patient on the date of service. I spent an additional 8 minutes on pre- and post-visit activities on the date of service.     The patient was not located and I was located within 250 yards of a hospital based location during the phone visit. The patient was physically located in [place] or a state in which I am permitted to provide care. The patient and/or parent/guardian understood that s/he may incur co-pays and cost sharing, and agreed to the telemedicine visit. The visit was reasonable and appropriate under the circumstances given the patient's presentation at the time.    The patient and/or parent/guardian has been advised of the potential risks and limitations of this mode of treatment (including, but not limited to, the absence of in-person examination) and has agreed to be treated using telemedicine. The patient's/patient's family's questions regarding telemedicine have been answered.    If the visit was completed in an ambulatory setting, the patient and/or parent/guardian has also been advised to contact their provider???s office for worsening conditions, and seek emergency medical treatment and/or call 911 if the patient deems either necessary.      RTC   ___________________________________________________________________    Primary Care Provider: Jenell Milliner, MD    Reason for Visit: Evaluation GCA     Identification: Pt self identified using name and date of birth    Patient location: [place]    HPI:  Erik [name] is a 86 y.o. smoker with h/o COPD, CAD s/p CABG,  HTN,  ongoing alcohol use and GIB 2/2 duodenal ulcer (likely 2/2 concurrent ibuprofen, alcohol and prednisone use) presenting for follow-up of biopsy proven giant cell arteritis complicated by ischemic optic neuropathy/ vision loss in left eye.       Last seen 07/2019 as telemedicine visit    He is taking Actemra 162 mg SQ  weekly (takes every Thursday).     Interim History:     He continues to do well from stand point of his GCA.  He denies new headache. No new vision changes.   He continues to have impaired vision on the left - he reports flickers in his left eye. Right sided vision remains intact although sometimes feels like his vision in that eye.  No scalp tenderness.  No temporal tenderness.  No jaw/tongue claudication.     No fevers, night sweats or unintended weight loss.   No GI sx. No reflux in past.     Says that he saw his PCP virtually today - reported ongoing slow urinary stream and foul smelling urine. He was prescribed abx. He is waiting on his daughter to pick that up.       Review of Systems:   All other systems were reviewed and were negative    Medical History:  Past Medical History:   Diagnosis Date   ??? Cataract 2005    cataract surgery   ??? COPD (chronic obstructive pulmonary disease) (CMS-HCC) 10/07/2016   ??? DJD (degenerative joint disease), lumbar    ??? Hypertension    ??? Tobacco abuse 02/15/2014       Allergies:  Patient has no known allergies.    Medications:     Current Outpatient Medications:   ???  albuterol HFA 90 mcg/actuation inhaler, Inhale 2 puffs every six (6) hours as needed for wheezing., Disp: 1 Inhaler, Rfl: 5  ???  amLODIPine (NORVASC) 10 MG tablet, Take 1 tablet (10 mg total) by mouth daily., Disp: 90 tablet, Rfl: 3  ???  ascorbic acid (VITAMIN C) 500 MG tablet, Take 500 mg by mouth daily., Disp: , Rfl:   ???  aspirin (ECOTRIN) 81 MG tablet, Take 81 mg by mouth daily., Disp: , Rfl:   ???  atenoloL (TENORMIN) 50 MG tablet, Take 1 tablet (50 mg total) by mouth Two (2) times a day., Disp: 180 tablet, Rfl: 1  ???  cyanocobalamin (VITAMIN B-12) 1000 MCG tablet, Take 1,000 mcg by mouth daily., Disp: , Rfl:   ???  hydrALAZINE (APRESOLINE) 25 MG tablet, Take 1 tablet (25 mg total) by mouth daily., Disp: 90 tablet, Rfl: 11  ???  losartan (COZAAR) 100 MG tablet, Take 1 tablet (100 mg total) by mouth daily., Disp: 90 tablet, Rfl: 1  ???  multivitamin (TAB-A-VITE/THERAGRAN) per tablet, Take 1 tablet by mouth daily., Disp: , Rfl:   ???  oxyCODONE-acetaminophen (PERCOCET) 5-325 mg per tablet, Take 1 tablet by mouth every four (4) hours as needed for pain. DO NOT FILL UNTIL 02/13/2020, Disp: 120 tablet, Rfl: 0  ???  simvastatin (ZOCOR) 10 MG tablet, Take 1 tablet (10 mg total) by mouth daily., Disp: 90 tablet, Rfl: 3  ???  tocilizumab (ACTEMRA) 162 mg/0.9 mL Syrg subcutaneous injection, Inject 0.9 mL (162 mg total) under the skin every seven (7) days., Disp: 4 mL, Rfl: 3  ???  triamcinolone (KENALOG) 0.1 % cream, Apply topically Two (2) times a day., Disp: 80 g, Rfl: 2  ???  ciprofloxacin HCl (CIPRO) 250 MG tablet, Take 1 tablet (250 mg total) by mouth Two (2) times a day for 7 days., Disp: 14 tablet, Rfl: 0  ???  tamsulosin (FLOMAX) 0.4 mg capsule, 1 capsules qhs, Disp: 30 capsule, Rfl: 11    Surgical History:  Past Surgical History:   Procedure Laterality Date   ??? CARDIAC VALVE REPLACEMENT      triple bypass   ???  CATARACT EXTRACTION     ??? CORONARY ARTERY BYPASS GRAFT  2001    x3   ??? EYE SURGERY  2005    cataracts   ??? PR REPAIR ING HERNIA,5+Y/O,REDUCIBL Left 06/19/2015    Procedure: REPAIR INITIAL INGUINAL HERNIA, AGE 89 YEARS OR OLDER; REDUCIBLE;  Surgeon: Shelda Jakes, MD;  Location: [facility] OR [facility];  Service: Trauma   ??? PR TEMPORAL ARTERY LIGATN OR BX Left 10/23/2017    Procedure: LIGATION OR BIOPSY, TEMPORAL ARTERY;  Surgeon: Vivien Rossetti, MD;  [facility];  Service: ENT   ??? PR UPPER GI ENDOSCOPY,DIAGNOSIS N/A 12/17/2017    Procedure: UGI ENDO, INCLUDE ESOPHAGUS, STOMACH, & DUODENUM &/OR JEJUNUM; DX W/WO COLLECTION SPECIMN, BY BRUSH OR WASH;  Surgeon: Bluford Kaufmann, MD;  [facility];  Service: Gastroenterology       Social History:  Lives in [place], [place]; lives with daughter  Social History     Tobacco Use   ??? Smoking status: Current Every Day Smoker     Packs/day: 0.50     Years: 4.00     Pack years: 2.00   ??? Smokeless tobacco: Never Used   ??? Tobacco comment: quit for 25 years, restarted 2014   Substance Use Topics   ??? Alcohol use: Yes     Alcohol/week: 2.0 standard drinks     Types: 2 Cans of beer per week     Frequency: 4 or more times a week     Drinks per session: 1 or 2     Binge frequency: Never     Comment: daily   ??? Drug use: No Family History:  Family History   Problem Relation Age of Onset   ??? Thyroid disease Sister    ??? Cancer Brother    ??? Glaucoma Neg Hx    ??? Macular degeneration Neg Hx    ??? Retinal detachment Neg Hx    ??? Substance Abuse Disorder Neg Hx    ??? Mental illness Neg Hx        Physical Exam:  General:  Alert and interactive; does not sound in distress  Pulmonary:  Speaking in full sentences; no cough, no wheezing  Psych: Mood and affect appropriate and congruent       Test Results    Final Diagnosis   A: Temporal artery, left, biopsy   - Segment of temporal artery, positive for active temporal arteritis.

## 2020-03-09 DIAGNOSIS — M5440 Lumbago with sciatica, unspecified side: Principal | ICD-10-CM

## 2020-03-09 DIAGNOSIS — M5136 Other intervertebral disc degeneration, lumbar region: Principal | ICD-10-CM

## 2020-03-09 DIAGNOSIS — G8929 Other chronic pain: Principal | ICD-10-CM

## 2020-03-09 NOTE — Unmapped (Signed)
LOV: 02/22/20. Last UDS and contract 09/2019.  If approved please send to pharmacy selected. sct

## 2020-03-10 NOTE — Unmapped (Signed)
error 

## 2020-03-12 MED ORDER — OXYCODONE-ACETAMINOPHEN 5 MG-325 MG TABLET
ORAL_TABLET | ORAL | 0 refills | 20.00000 days | Status: CP | PRN
Start: 2020-03-12 — End: 2020-04-11

## 2020-03-14 NOTE — Unmapped (Signed)
Reason for call: Pt is requesting if someone could please contact Actemra for questions they have regarding medication [phone]      Last ov: Visit date not found  Next ov: Visit date not found

## 2020-03-15 NOTE — Unmapped (Signed)
Called and spoke with Cassandra.    Pharmacy only needed to verify Dr. Chaya Jan office contact information.  No further information is needed from our clinic.

## 2020-03-16 NOTE — Unmapped (Signed)
Reason for call:     Patient is calling again and stated that he is still being told that they need clarification before they will mail out his Actemra. He is was very frustrated and irritated that he keeps having to call us.     I tried to inform patient of note left from [facility] but he does not seem to be satisfied with that. Will you please call Pt and let him know if anything else is needed to be done to get his medication mailed out to him.     Last ov: Visit date not found  Next ov: Visit date not found

## 2020-03-17 NOTE — Unmapped (Signed)
Called and spoke with Erik [name].    Erik [name] states that there is nothing further needed on our end.  Script was delivered today.    Called and confirmed with patient who has received his meds today\.

## 2020-04-07 DIAGNOSIS — G8929 Other chronic pain: Principal | ICD-10-CM

## 2020-04-07 DIAGNOSIS — M5136 Other intervertebral disc degeneration, lumbar region: Principal | ICD-10-CM

## 2020-04-07 DIAGNOSIS — M5440 Lumbago with sciatica, unspecified side: Principal | ICD-10-CM

## 2020-04-07 NOTE — Unmapped (Signed)
P/C requesting refill on Percocet , last ordered on 03/12/2020, last visit 02/22/20.Controlled Substance Contract signed on 01/01/2019. UDS obtained on 10/14/2028 and was negative.

## 2020-04-11 MED ORDER — OXYCODONE-ACETAMINOPHEN 5 MG-325 MG TABLET
ORAL_TABLET | ORAL | 0 refills | 20 days | Status: CP | PRN
Start: 2020-04-11 — End: 2020-05-11

## 2020-05-08 DIAGNOSIS — M5440 Lumbago with sciatica, unspecified side: Principal | ICD-10-CM

## 2020-05-08 DIAGNOSIS — M5136 Other intervertebral disc degeneration, lumbar region: Principal | ICD-10-CM

## 2020-05-08 DIAGNOSIS — G8929 Other chronic pain: Principal | ICD-10-CM

## 2020-05-08 MED ORDER — OXYCODONE-ACETAMINOPHEN 5 MG-325 MG TABLET
ORAL_TABLET | ORAL | 0 refills | 20 days | PRN
Start: 2020-05-08 — End: 2020-06-07

## 2020-05-09 NOTE — Unmapped (Signed)
P/C requesting refill on percocet , last ordered on 04/07/2020, last visit 02/22/2020.Controlled Substance Contract signed on 01/01/2019. UDS obtained on 01/01/2019 and was appropriate      .

## 2020-05-10 MED ORDER — OXYCODONE-ACETAMINOPHEN 5 MG-325 MG TABLET
ORAL_TABLET | ORAL | 0 refills | 20 days | Status: CP | PRN
Start: 2020-05-10 — End: 2020-06-09

## 2020-05-15 NOTE — Unmapped (Signed)
Medicare Annual Wellness Visit    Risks identified  There were no significant safety risks identified at today's visit.  Treatment options and their associated risks/benefits were reviewed with the patient.    End of Life Care Planning  Advance Directives- ACP form given to the pt to complete and return for updating into EMR    Personalized Prevention Plan  During the course of the visit the patient was educated and counseled about appropriate screening and preventive services.  A personalized prevention plan was reviewed with the patient and a written copy was provided for personal records (available for review in Patient Instructions).  The following screening tests were ordered today:    Health Maintenance-    CRS- defer as per guidelines  Tdap vaccine-  Up to date as per pt  Pneumovax-12/2009  Prevnar- 03/2015  Shingrix-recommended patient to receive a local pharmacy/ pt declines due to cost of vaccine  COVID-19 vaccine administered May/2021  Flu vaccine-08/2019        Orders Placed This Encounter   Procedures   ??? CBC   ??? Basic Metabolic Panel     Order Specific Question:   Is this a fasting order?     Answer:   No     Order Specific Question:   Release to patient     Answer:   Immediate       Subjective:     Erik [name] is a 86 y.o. male who presents for a Medicare Wellness Visit.      HPI-we had a telemedicine encounter in March/2021.  His HPI is as follows:    Left flank pain  Slow urinary stream  Chronic obstructive pulmonary disease  Chronic bilateral low back pain with sciatica, ??  DDD (degenerative disc disease), lumbar  Essential hypertension, benign  Tobacco abuse  Giant cell arteritis (CMS-HCC)  Coronary artery disease   Dyslipidemia  ??      He was advised then as follows:    -Left flank pain: almost gone presently but he is having strong odor to urine: pt started on cipro 250 mg bid for 7 days.  -Presumptive BPH:  History of slow urinary flow- pt started on Flomax 0.4 mg at bedtime.  - chronic low back pain in the setting of lumbar DDD:  He insisted on keeping his percocet Rx at 120 tabs a month. Review of PDMP reflects compliance.  Annual UDS done in 09/2019, confirmed presence of opioid and his annual controlled substance contract was renewed at that time. His last percocet Rx was dispensed on 02/13/2020.     - COPD history: pt continue to smoke occasionally.  He has no chronic SOB symptoms.   Most inhalers prescribed have been to costly for the pt.  He is given Rx for albuterol inhaler to start for prn/rescue purposes. He is advised on not smoking cigarettes. He uses the rescue inhaler about twice a day.   He wishes to continue with this regimen since other inhalers attempted in the past are very expensive. Inhaler education done today.   - history of Giant Cell arteritis: he will continue follow up with his rheumatologist as scheduled along with medication regimen as directed. He self administers every 2 weeks Actemra injections and he has a rheumatology follow up visit for 02/2020. He has a history of ischemic optic neuropathy and he was last seen by [facility] in 2019. Pt states he has his routine eye exam at [facility] optometry and is reminded to schedule  an appt soon.   - HTN/CAD/Dyslipidemia history: pt remains normotensive and chest pain free on current medication regimen.  He had  normal electrolytes, creatinine and LFT's in 08/2019.  Lipid panel done in October/2020 revealed HDL/LDL of 51/77. His home BP readings are always normal.  - macrocytosis noted on CBC in 08/2019: this had been noted in the past. Pt states he was a heavy drinker in the past but more recently reduced his daily alcohol consumption to 3 beers a day.              Medicare eligibility date: 86 yr old  Type of visit: annual AWV    Health Risk Assessment:  The patient's Health Risk Assessment forms were completed/reviewed.      Comprehensive Medical History  Patient Active Problem List   Diagnosis   ??? Essential hypertension, benign   ??? Chronic back pain   ??? CAD (coronary artery disease), autologous vein bypass graft   ??? Right carotid bruit   ??? Tobacco abuse counseling   ??? Tobacco abuse   ??? Chronic pain syndrome   ??? COPD (chronic obstructive pulmonary disease) (CMS-HCC)   ??? Risk for falls   ??? Vision, loss, sudden, left   ??? Giant cell arteritis (CMS-HCC)   ??? Duodenitis   ??? Melena   ??? Constipation   ??? DDD (degenerative disc disease), lumbar   ??? Dyslipidemia   ??? Macrocytosis without anemia     Past Medical History:   Diagnosis Date   ??? Cataract 2005    cataract surgery   ??? COPD (chronic obstructive pulmonary disease) (CMS-HCC) 10/07/2016   ??? DJD (degenerative joint disease), lumbar    ??? Hypertension    ??? Tobacco abuse 02/15/2014     Past Surgical History:   Procedure Laterality Date   ??? CARDIAC VALVE REPLACEMENT      triple bypass   ??? CATARACT EXTRACTION     ??? CORONARY ARTERY BYPASS GRAFT  2001    x3   ??? EYE SURGERY  2005    cataracts   ??? PR REPAIR ING HERNIA,5+Y/O,REDUCIBL Left 06/19/2015    Procedure: REPAIR INITIAL INGUINAL HERNIA, AGE 64 YEARS OR OLDER; REDUCIBLE;  Surgeon: Shelda Jakes, MD;  Location: [facility] OR [facility];  Service: Trauma   ??? PR TEMPORAL ARTERY LIGATN OR BX Left 10/23/2017    Procedure: LIGATION OR BIOPSY, TEMPORAL ARTERY;  Surgeon: Vivien Rossetti, MD;  Location: [facility] OR [facility];  Service: ENT   ??? PR UPPER GI ENDOSCOPY,DIAGNOSIS N/A 12/17/2017    Procedure: UGI ENDO, INCLUDE ESOPHAGUS, STOMACH, & DUODENUM &/OR JEJUNUM; DX W/WO COLLECTION SPECIMN, BY BRUSH OR WASH;  Surgeon: Bluford Kaufmann, MD;  [facility]: [facility];  Service: Gastroenterology     Family History   Problem Relation Age of Onset   ??? Thyroid disease Sister    ??? Cancer Brother    ??? Glaucoma Neg Hx    ??? Macular degeneration Neg Hx    ??? Retinal detachment Neg Hx    ??? Substance Abuse Disorder Neg Hx    ??? Mental illness Neg Hx      No Known Allergies  Current Outpatient Medications   Medication Sig Dispense Refill   ??? albuterol HFA 90 mcg/actuation inhaler Inhale 2 puffs every six (6) hours as needed for wheezing. 1 Inhaler 5   ??? amLODIPine (NORVASC) 10 MG tablet Take 1 tablet (10 mg total) by mouth daily. 90 tablet 3   ??? ascorbic acid (VITAMIN  C) 500 MG tablet Take 500 mg by mouth daily.     ??? aspirin (ECOTRIN) 81 MG tablet Take 81 mg by mouth daily.     ??? atenoloL (TENORMIN) 50 MG tablet Take 1 tablet (50 mg total) by mouth Two (2) times a day. 180 tablet 1   ??? cyanocobalamin (VITAMIN B-12) 1000 MCG tablet Take 1,000 mcg by mouth daily.     ??? hydrALAZINE (APRESOLINE) 25 MG tablet Take 1 tablet (25 mg total) by mouth daily. 90 tablet 11   ??? losartan (COZAAR) 100 MG tablet Take 1 tablet (100 mg total) by mouth daily. 90 tablet 1   ??? multivitamin (TAB-A-VITE/THERAGRAN) per tablet Take 1 tablet by mouth daily.     ??? oxyCODONE-acetaminophen (PERCOCET) 5-325 mg per tablet Take 1 tablet by mouth every four (4) hours as needed for pain. DO NOT FILL UNTIL 04/11/2020 120 tablet 0   ??? simvastatin (ZOCOR) 10 MG tablet Take 1 tablet (10 mg total) by mouth daily. 90 tablet 3   ??? tamsulosin (FLOMAX) 0.4 mg capsule 1 capsules qhs 30 capsule 11   ??? tocilizumab (ACTEMRA) 162 mg/0.9 mL Syrg subcutaneous injection Inject 0.9 mL (162 mg total) under the skin every fourteen (14) days. 4 mL 3   ??? triamcinolone (KENALOG) 0.1 % cream Apply topically Two (2) times a day. 80 g 2     No current facility-administered medications for this visit.       Hospitalizations:  None    Current Providers:   Patient Care Team:  Jenell Milliner, MD as PCP - General (Family Medicine)  Jenell Milliner, MD as PCP - General-ATTRIBUTED    Other Specialists, Providers, Medical Suppliers:  [facility]  [facility]    Social History:   Occupation:  retired   Marital Status:  widowed   Lives with: lives with his daughter   Diet:  Heart Healthy   Physical Activity:  Home daily ROM exercises and walking regimen  Social History     Socioeconomic History   ??? Marital status: Widowed Spouse name: None   ??? Number of children: None   ??? Years of education: None   ??? Highest education level: None   Occupational History   ??? None   Tobacco Use   ??? Smoking status: Current Every Day Smoker     Packs/day: 0.50     Years: 4.00     Pack years: 2.00   ??? Smokeless tobacco: Never Used   ??? Tobacco comment: quit for 25 years, restarted 2014   Vaping Use   ??? Vaping Use: Never used   Substance and Sexual Activity   ??? Alcohol use: Yes     Alcohol/week: 2.0 standard drinks     Types: 2 Cans of beer per week     Comment: daily   ??? Drug use: No   ??? Sexual activity: None   Other Topics Concern   ??? Do you use sunscreen? No   ??? Tanning bed use? No   ??? Are you easily burned? No   ??? Excessive sun exposure? No   ??? Blistering sunburns? Yes     Comment: Past   Social History Narrative    Lives in [place], wife passed 7 years ago. His daughter lives with him. Granddaughter, Victorino Dike, manages his finances.      Social Determinants of Health     Financial Resource Strain:    ??? Difficulty of Paying Living Expenses:    Food Insecurity:    ???  Worried About Programme researcher, broadcasting/film/video in the Last Year:    ??? Barista in the Last Year:    Transportation Needs:    ??? Freight forwarder (Medical):    ??? Lack of Transportation (Non-Medical):    Physical Activity:    ??? Days of Exercise per Week:    ??? Minutes of Exercise per Session:    Stress:    ??? Feeling of Stress :    Social Connections:    ??? Frequency of Communication with Friends and Family:    ??? Frequency of Social Gatherings with Friends and Family:    ??? Attends Religious Services:    ??? Database administrator or Organizations:    ??? Attends Engineer, structural:    ??? Marital Status:        Preventive Care:  Health Maintenance   Topic Date Due   ??? COPD Spirometry  Never done   ??? DTaP/Tdap/Td Vaccines (1 - Tdap) Never done   ??? Zoster Vaccines (1 of 2) Never done   ??? Potassium Monitoring  07/04/2019   ??? COVID-19 Vaccine (2 - Moderna 2-dose series) 05/23/2020   ??? Serum Creatinine Monitoring  09/15/2020   ??? Pneumococcal Vaccines  Completed   ??? Influenza Vaccine  Completed     Immunization History   Administered Date(s) Administered   ??? COVID-19 VACCINE,MRNA(MODERNA)(PF)(IM) 04/25/2020   ??? Influenza Vaccine Quad (IIV4 PF) 65mo+ injectable 01/15/2017, 11/05/2018, 09/16/2019   ??? Influenza Virus Vaccine, unspecified formulation 08/23/2014   ??? PNEUMOCOCCAL POLYSACCHARIDE 23 12/23/2009   ??? Pneumococcal Conjugate 13-Valent 04/06/2015       Depression Screen:  1.  Over the past two weeks, have you felt down, depressed or hopeless?  No  2.  Over the past two weeks, have you felt little interest or pleasure in doing things?  No    Safety Screen:  1.  Do you need help with the phone, transportation, shopping, preparing meals, housework, laundry, medications, or managing money?  No  2.  Does your home have rugs in the hallway, lack grab bars in the bathroom, lack handrails on the stairs, or have poor lighting?  No       Objective:     Blood pressure 142/58, pulse 59, temperature 36.6 ??C (97.9 ??F), height 152.4 cm (5'), weight 64.9 kg (143 lb), SpO2 95 %.  Body mass index is 27.93 kg/m??.    Functional Ability: 100 % with ADL's  Hearing assessment: normal to finger rub testing  Memory assessment: pt able to remember 3 words,  Able to draw face of clock but not time given  Mobility Test : 7 seconds      Physical Exam:  Neck exam- no masses  Lung exam- clear to auscultation  Cardiac exam- RRR nl s1 and s2  abd exam- no HSM, non tender, no masses  Ext exam- no edema    Assessment and Plan-  AWV  Screening labs- the pt had normal AST/ALT and  lipid panel in 9 and 09/2019. I will repeat a CBC today given his history of macrocytosis along with BMP.  He admits to drinking 3 beers a day and he is advised to curtail this.   Referrals- pt to schedule routine eye appt at [facility]  Vaccines-Shingrix recommended and patient to receive at local pharmacy. Pt states vaccine is very expensive and he will not be getting it.   Follow up- 3 months or sooner if needed

## 2020-05-23 ENCOUNTER — Encounter: Admit: 2020-05-23 | Discharge: 2020-05-24 | Payer: MEDICARE

## 2020-05-23 DIAGNOSIS — I10 Essential (primary) hypertension: Principal | ICD-10-CM

## 2020-05-23 DIAGNOSIS — D7589 Other specified diseases of blood and blood-forming organs: Principal | ICD-10-CM

## 2020-05-23 DIAGNOSIS — Z0000 Encounter for general adult medical examination without abnormal findings: Principal | ICD-10-CM

## 2020-05-23 LAB — BASIC METABOLIC PANEL
ANION GAP: 13 mmol/L (ref 7–15)
BLOOD UREA NITROGEN: 24 mg/dL — ABNORMAL HIGH (ref 7–21)
BUN / CREAT RATIO: 22
CALCIUM: 10 mg/dL (ref 8.5–10.2)
CHLORIDE: 99 mmol/L (ref 98–107)
CO2: 25 mmol/L (ref 22.0–30.0)
CREATININE: 1.09 mg/dL (ref 0.70–1.30)
EGFR CKD-EPI AA MALE: 72 mL/min/{1.73_m2} (ref >=60)
EGFR CKD-EPI NON-AA MALE: 62 mL/min/{1.73_m2} (ref >=60)
SODIUM: 137 mmol/L (ref 135–145)

## 2020-05-23 LAB — CBC
HEMATOCRIT: 39.9 % — ABNORMAL LOW (ref 41.0–53.0)
HEMOGLOBIN: 13.4 g/dL — ABNORMAL LOW (ref 13.5–17.5)
MEAN CORPUSCULAR HEMOGLOBIN CONC: 33.6 g/dL (ref 31.0–37.0)
MEAN CORPUSCULAR HEMOGLOBIN: 34 pg (ref 26.0–34.0)
MEAN CORPUSCULAR VOLUME: 101.2 fL — ABNORMAL HIGH (ref 80.0–100.0)
MEAN PLATELET VOLUME: 9.9 fL (ref 7.0–10.0)
PLATELET COUNT: 230 10*9/L (ref 150–440)
RED BLOOD CELL COUNT: 3.94 10*12/L — ABNORMAL LOW (ref 4.50–5.90)
RED CELL DISTRIBUTION WIDTH: 12.7 % (ref 12.0–15.0)
WBC ADJUSTED: 7.6 10*9/L (ref 4.5–11.0)

## 2020-05-23 LAB — HEMOGLOBIN: Hemoglobin:MCnc:Pt:Bld:Qn:: 13.4 — ABNORMAL LOW

## 2020-05-23 LAB — GLUCOSE RANDOM: Glucose:MCnc:Pt:Ser/Plas:Qn:: 111 — ABNORMAL HIGH

## 2020-05-23 NOTE — Unmapped (Addendum)
-  Schedule a routine eye exam at [facility] in the near future  -For allergic rhinitis symptoms, I recommend starting OTC allegra 180 mg , once daily along with OTC Flonase nasal spray as directed.  - complete advance care planning form and return to our office

## 2020-05-23 NOTE — Unmapped (Signed)
05/23/2020    PCMH Components:     Family, social, cultural characteristics: none, per patient .    Patient has the following communication needs: none, per patient   Health Literacy: How confident are you that you understand your health issues/concerns, can participate in your care, and manage your care along with your physician: confident.  Behaviors Affecting Health: none, per patient  Family history of mental health illness and/or substance abuse: asked patient/parent and none disclosed.  Have you been seen by any medical provider that we have not referred you to since your last visit ? No  Discussed a Living Will with the patient and [id number] in education materials about a Living Will. Materials provided at today's visit.

## 2020-05-24 LAB — FOLATE: Folate:MCnc:Pt:Ser/Plas:Qn:: >20 — ABNORMAL HIGH

## 2020-05-24 LAB — FERRITIN: Ferritin:MCnc:Pt:Ser/Plas:Qn:: 66.1

## 2020-05-24 LAB — VITAMIN B-12: Cobalamins:MCnc:Pt:Ser/Plas:Qn:: 842

## 2020-05-24 LAB — IRON: Iron:MCnc:Pt:Ser/Plas:Qn:: 161

## 2020-05-24 NOTE — Unmapped (Signed)
Addended by: Jenell Milliner on: 05/24/2020 08:47 AM     Modules accepted: Orders

## 2020-05-24 NOTE — Unmapped (Signed)
BMP is normal except for non fasting glucose level of 11.

## 2020-05-24 NOTE — Unmapped (Signed)
CBC reveals mild anemia with improvement of macrocytosis with MCV of 101.  Add on lab request for iron, ferritin, vitamin B12 and folate levels.

## 2020-05-25 NOTE — Unmapped (Signed)
Screening iron, ferritin, Vit B 12 and folate levels are normal.  Elevation in MCV may be related to pt's alcohol use.  Will continue to monitor closely and consider hematology referral in the future.

## 2020-06-06 DIAGNOSIS — M5136 Other intervertebral disc degeneration, lumbar region: Principal | ICD-10-CM

## 2020-06-06 DIAGNOSIS — G8929 Other chronic pain: Principal | ICD-10-CM

## 2020-06-06 DIAGNOSIS — M5440 Lumbago with sciatica, unspecified side: Principal | ICD-10-CM

## 2020-06-06 NOTE — Unmapped (Signed)
P/C requesting refill on percocet , last ordered on 05/09/2020, last visit 05/23/20.Controlled Substance Contract signed on 01/01/2019 . UDS obtained on 10/15/2019 and was appropriate    .

## 2020-06-09 MED ORDER — OXYCODONE-ACETAMINOPHEN 5 MG-325 MG TABLET
ORAL_TABLET | ORAL | 0 refills | 20.00000 days | Status: CP | PRN
Start: 2020-06-09 — End: 2020-07-09

## 2020-06-17 DIAGNOSIS — I10 Essential (primary) hypertension: Principal | ICD-10-CM

## 2020-06-17 MED ORDER — AMLODIPINE 10 MG TABLET
ORAL_TABLET | 0 refills | 0 days
Start: 2020-06-17 — End: ?

## 2020-06-19 MED ORDER — AMLODIPINE 10 MG TABLET
ORAL_TABLET | 0 refills | 0 days | Status: CP
Start: 2020-06-19 — End: ?

## 2020-06-19 NOTE — Unmapped (Signed)
P/C requesting refill on amlodipine, last ordered on 05/05/19, last visit 05/23/20

## 2020-07-04 DIAGNOSIS — M5440 Lumbago with sciatica, unspecified side: Principal | ICD-10-CM

## 2020-07-04 DIAGNOSIS — M5136 Other intervertebral disc degeneration, lumbar region: Principal | ICD-10-CM

## 2020-07-04 DIAGNOSIS — G8929 Other chronic pain: Principal | ICD-10-CM

## 2020-07-04 NOTE — Unmapped (Signed)
P/C requesting refill on percocet , last ordered on 06/09/20, last visit 05/23/20  Controlled Substance Contract signed on 01/01/2019. UDS obtained on [id number] and was appropriate.

## 2020-07-09 MED ORDER — OXYCODONE-ACETAMINOPHEN 5 MG-325 MG TABLET
ORAL_TABLET | ORAL | 0 refills | 20 days | Status: CP | PRN
Start: 2020-07-09 — End: 2020-08-09

## 2020-07-27 DIAGNOSIS — I10 Essential (primary) hypertension: Principal | ICD-10-CM

## 2020-07-27 MED ORDER — HYDRALAZINE 25 MG TABLET
ORAL_TABLET | Freq: Every day | ORAL | 0 refills | 90.00000 days | Status: CP
Start: 2020-07-27 — End: 2021-07-27

## 2020-07-27 NOTE — Unmapped (Signed)
refill 

## 2020-08-07 DIAGNOSIS — M5440 Lumbago with sciatica, unspecified side: Principal | ICD-10-CM

## 2020-08-07 DIAGNOSIS — G8929 Other chronic pain: Principal | ICD-10-CM

## 2020-08-07 DIAGNOSIS — M5136 Other intervertebral disc degeneration, lumbar region: Principal | ICD-10-CM

## 2020-08-07 NOTE — Unmapped (Signed)
Patient called needing a refill on Oxycodone.  States he left a message last week.  Please advise when sent as he needs to arrange a ride for pickup.

## 2020-08-08 MED ORDER — OXYCODONE-ACETAMINOPHEN 5 MG-325 MG TABLET
ORAL_TABLET | ORAL | 0 refills | 20.00000 days | Status: CP | PRN
Start: 2020-08-08 — End: 2020-09-08

## 2020-08-14 NOTE — Unmapped (Signed)
I spent 25 minutes on the phone with the patient on the date of service. I spent an additional 10 minutes on pre- and post-visit activities on the date of service.     The patient was physically located in [place] or a state in which I am permitted to provide care. The patient and/or parent/guardian understood that s/he may incur co-pays and cost sharing, and agreed to the telemedicine visit. The visit was reasonable and appropriate under the circumstances given the patient's presentation at the time.    The patient and/or parent/guardian has been advised of the potential risks and limitations of this mode of treatment (including, but not limited to, the absence of in-person examination) and has agreed to be treated using telemedicine. The patient's/patient's family's questions regarding telemedicine have been answered.     If the visit was completed in an ambulatory setting, the patient and/or parent/guardian has also been advised to contact their provider???s office for worsening conditions, and seek emergency medical treatment and/or call 911 if the patient deems either necessary.      Erik [name] is a 86 y.o. male  is here for   Chief Complaint   Patient presents with   ??? telephone follow up visit       Assessment/Plan:    Erik [name] was seen today for telephone follow up visit.    Diagnoses and all orders for this visit:    Chronic obstructive pulmonary disease, unspecified COPD type (CMS-HCC)    Coronary artery disease involving autologous vein coronary bypass graft without angina pectoris  -     simvastatin (ZOCOR) 10 MG tablet; Take 1 tablet (10 mg total) by mouth daily.    Essential hypertension, benign  -     hydrALAZINE (APRESOLINE) 25 MG tablet; Take 1 tablet (25 mg total) by mouth daily.  -     losartan (COZAAR) 100 MG tablet; Take 1 tablet (100 mg total) by mouth daily.  -     atenoloL (TENORMIN) 50 MG tablet; Take 1 tablet (50 mg total) by mouth Two (2) times a day.  -     amLODIPine (NORVASC) 10 MG tablet; Take 1 tablet (10 mg total) by mouth daily.    DDD (degenerative disc disease), lumbar    Dyslipidemia    Macrocytosis without anemia    Chronic pain syndrome    Tobacco abuse    Giant cell arteritis (CMS-HCC)    Benign prostatic hyperplasia, unspecified whether lower urinary tract symptoms present    -BPH:  History of slow urinary flow- pt started on Flomax 0.4 mg at bedtime.  His symptoms remain stable.   - chronic low back pain in the setting of lumbar DDD:  He insisted on keeping his percocet Rx at 120 tabs a month. Review of PDMP reflects compliance.  Annual UDS done in 09/2019, confirmed presence of opioid and his annual controlled substance contract was renewed at that time. His last percocet Rx was dispensed on 08/08/2020.     - COPD history: pt continue to smoke occasionally.  He has no chronic SOB symptoms.   Most inhalers prescribed have been to costly for the pt.  He is given Rx for albuterol inhaler to start for prn/rescue purposes. He is advised on not smoking cigarettes. He uses the rescue inhaler about twice a day.   He wishes to continue with this regimen since other inhalers attempted in the past are very expensive. Inhaler education done today.   - history of Giant Cell  arteritis: he will continue follow up with his rheumatologist as scheduled along with medication regimen as directed. He self administers every 2 weeks Actemra injections and he has a rheumatology follow up visit for 02/2020. He has a history of ischemic optic neuropathy and he was last seen by [facility] in 2019. Pt states he has his routine eye exam at [facility] optometry and is reminded to schedule an appt soon.   - HTN/CAD/Dyslipidemia history: pt remains normotensive and chest pain free on current medication regimen.  He had  normal electrolytes, creatinine and LFT's in 08/2019.  Lipid panel done in October/2020 revealed HDL/LDL of 51/77. His home BP readings are always normal.  BMP result was normal in June/2021 except for nonfasting glucose of 111.  - macrocytosis noted on CBC in 08/2019: this had been noted in the past. Pt states he was a heavy drinker in the past but more recently reduced his daily alcohol consumption to 3 beers a day.  Repeat CBC done in June/2021 revealed H/H of 13/39 with mildly elevated MCV at 101 with normal ferritin, iron, folate and vitamin B12 levels.  He had normal white blood cell and platelet counts.          Return in about 4 months (around 12/21/2020) for Recheck.    Subjective:     HPI    Reason for visit- routine follow-up. I last saw the patient in June/2021 for annual Medicare wellness visit.  His HPI is as follows:  Left flank pain  Slow urinary stream  Chronic obstructive pulmonary disease  Chronic bilateral low back pain with sciatica, ??  DDD (degenerative disc disease), lumbar  Essential hypertension, benign  Tobacco abuse  Giant cell arteritis   Coronary artery disease   Dyslipidemia  ??      He was advised then as follows:  -Left flank pain: almost gone presently but he repeorted having strong odor to urine: pt was started on cipro 250 mg bid for 7 days. Pt states that took care of the problem.  -Presumptive BPH:  History of slow urinary flow- pt started on Flomax 0.4 mg at bedtime.  His symptoms remain stable.   - chronic low back pain in the setting of lumbar DDD:  He insisted on keeping his percocet Rx at 120 tabs a month. Review of PDMP reflects compliance.  Annual UDS done in 09/2019, confirmed presence of opioid and his annual controlled substance contract was renewed at that time. His last percocet Rx was dispensed on 08/08/2020.     - COPD history: pt continue to smoke occasionally.  He has no chronic SOB symptoms.   Most inhalers prescribed have been to costly for the pt.  He is given Rx for albuterol inhaler to start for prn/rescue purposes. He is advised on not smoking cigarettes. He uses the rescue inhaler about twice a day.   He wishes to continue with this regimen since other inhalers attempted in the past are very expensive. Inhaler education done today.   - history of Giant Cell arteritis: he will continue follow up with his rheumatologist as scheduled along with medication regimen as directed. He self administers every 2 weeks Actemra injections and he has a rheumatology follow up visit for 02/2020. He has a history of ischemic optic neuropathy and he was last seen by [facility] in 2019. Pt states he has his routine eye exam at [facility] optometry and is reminded to schedule an appt soon.   - HTN/CAD/Dyslipidemia history: pt remains  normotensive and chest pain free on current medication regimen.  He had  normal electrolytes, creatinine and LFT's in 08/2019.  Lipid panel done in October/2020 revealed HDL/LDL of 51/77. His home BP readings are always normal.  BMP result was normal in June/2021 except for nonfasting glucose of 111.  - macrocytosis noted on CBC in 08/2019: this had been noted in the past. Pt states he was a heavy drinker in the past but more recently reduced his daily alcohol consumption to 3 beers a day.  Repeat CBC done in June/2021 revealed H/H of 13/39 with mildly elevated MCV at 101 with normal ferritin, iron, folate and vitamin B12 levels.  He had normal white blood cell and platelet counts.      ROS  Constitutional:  Denies  unexpected weight loss or gain, or weakness   Eyes:  Denies visual changes  Respiratory:  Denies cough or shortness of breath. No change in exercise  tolerance  Cardiovascular:  Denies chest pain, palpitations or lower extremity swelling   GI:  Denies abdominal pain, diarrhea, constipation   Musculoskeletal:  Denies myalgias  Skin:  Denies nonhealing lesions  Neurologic:  Denies headache, focal weakness or numbness, tingling  Endocrine:  Denies polyuria or polydypsia   Psychiatric:  Denies depression, anxiety      Outpatient Medications Prior to Visit   Medication Sig Dispense Refill   ??? albuterol HFA 90 mcg/actuation inhaler Inhale 2 puffs every six (6) hours as needed for wheezing. 1 Inhaler 5   ??? ascorbic acid (VITAMIN C) 500 MG tablet Take 500 mg by mouth daily.     ??? aspirin (ECOTRIN) 81 MG tablet Take 81 mg by mouth daily.     ??? cyanocobalamin (VITAMIN B-12) 1000 MCG tablet Take 1,000 mcg by mouth daily.     ??? multivitamin (TAB-A-VITE/THERAGRAN) per tablet Take 1 tablet by mouth daily.     ??? oxyCODONE-acetaminophen (PERCOCET) 5-325 mg per tablet Take 1 tablet by mouth every four (4) hours as needed for pain. DO NOT FILL UNTIL 04/11/2020 120 tablet 0   ??? tamsulosin (FLOMAX) 0.4 mg capsule 1 capsules qhs 30 capsule 11   ??? tocilizumab (ACTEMRA) 162 mg/0.9 mL Syrg subcutaneous injection Inject 0.9 mL (162 mg total) under the skin every fourteen (14) days. 4 mL 3   ??? triamcinolone (KENALOG) 0.1 % cream Apply topically Two (2) times a day. 80 g 2   ??? amLODIPine (NORVASC) 10 MG tablet Take 1 tablet by mouth once daily 90 tablet 0   ??? atenoloL (TENORMIN) 50 MG tablet Take 1 tablet (50 mg total) by mouth Two (2) times a day. 180 tablet 1   ??? hydrALAZINE (APRESOLINE) 25 MG tablet Take 1 tablet (25 mg total) by mouth daily. 90 tablet 0   ??? losartan (COZAAR) 100 MG tablet Take 1 tablet (100 mg total) by mouth daily. 90 tablet 1   ??? simvastatin (ZOCOR) 10 MG tablet Take 1 tablet (10 mg total) by mouth daily. 90 tablet 3     No facility-administered medications prior to visit.         Objective:     Total time spent on telephone visit: 25 minutes  Total time spent pre and post visit planning: 10 minutes      Allergies:     Patient has no known allergies.    Current Medications:     Current Outpatient Medications   Medication Sig Dispense Refill   ??? albuterol HFA 90 mcg/actuation inhaler Inhale 2 puffs  every six (6) hours as needed for wheezing. 1 Inhaler 5   ??? amLODIPine (NORVASC) 10 MG tablet Take 1 tablet (10 mg total) by mouth daily. 90 tablet 3   ??? ascorbic acid (VITAMIN C) 500 MG tablet Take 500 mg by mouth daily. ??? aspirin (ECOTRIN) 81 MG tablet Take 81 mg by mouth daily.     ??? atenoloL (TENORMIN) 50 MG tablet Take 1 tablet (50 mg total) by mouth Two (2) times a day. 180 tablet 3   ??? cyanocobalamin (VITAMIN B-12) 1000 MCG tablet Take 1,000 mcg by mouth daily.     ??? hydrALAZINE (APRESOLINE) 25 MG tablet Take 1 tablet (25 mg total) by mouth daily. 90 tablet 3   ??? losartan (COZAAR) 100 MG tablet Take 1 tablet (100 mg total) by mouth daily. 90 tablet 3   ??? multivitamin (TAB-A-VITE/THERAGRAN) per tablet Take 1 tablet by mouth daily.     ??? oxyCODONE-acetaminophen (PERCOCET) 5-325 mg per tablet Take 1 tablet by mouth every four (4) hours as needed for pain. DO NOT FILL UNTIL 04/11/2020 120 tablet 0   ??? simvastatin (ZOCOR) 10 MG tablet Take 1 tablet (10 mg total) by mouth daily. 90 tablet 3   ??? tamsulosin (FLOMAX) 0.4 mg capsule 1 capsules qhs 30 capsule 11   ??? tocilizumab (ACTEMRA) 162 mg/0.9 mL Syrg subcutaneous injection Inject 0.9 mL (162 mg total) under the skin every fourteen (14) days. 4 mL 3   ??? triamcinolone (KENALOG) 0.1 % cream Apply topically Two (2) times a day. 80 g 2     No current facility-administered medications for this visit.           Note - This record has been created using [employer]. Chart creation errors have been sought, but may not always have been located. Such creation errors do not reflect on the standard of medical care.    Jenell Milliner, MD

## 2020-08-21 ENCOUNTER — Encounter: Admit: 2020-08-21 | Discharge: 2020-08-22 | Payer: MEDICARE

## 2020-08-21 DIAGNOSIS — M316 Other giant cell arteritis: Principal | ICD-10-CM

## 2020-08-21 DIAGNOSIS — E785 Hyperlipidemia, unspecified: Principal | ICD-10-CM

## 2020-08-21 DIAGNOSIS — I10 Essential (primary) hypertension: Principal | ICD-10-CM

## 2020-08-21 DIAGNOSIS — J449 Chronic obstructive pulmonary disease, unspecified: Principal | ICD-10-CM

## 2020-08-21 DIAGNOSIS — D7589 Other specified diseases of blood and blood-forming organs: Principal | ICD-10-CM

## 2020-08-21 DIAGNOSIS — M5136 Other intervertebral disc degeneration, lumbar region: Principal | ICD-10-CM

## 2020-08-21 DIAGNOSIS — G894 Chronic pain syndrome: Principal | ICD-10-CM

## 2020-08-21 DIAGNOSIS — I25810 Atherosclerosis of coronary artery bypass graft(s) without angina pectoris: Principal | ICD-10-CM

## 2020-08-21 DIAGNOSIS — N400 Enlarged prostate without lower urinary tract symptoms: Principal | ICD-10-CM

## 2020-08-21 DIAGNOSIS — Z720 Tobacco use: Principal | ICD-10-CM

## 2020-08-21 MED ORDER — AMLODIPINE 10 MG TABLET
ORAL_TABLET | Freq: Every day | ORAL | 3 refills | 90 days | Status: CP
Start: 2020-08-21 — End: ?

## 2020-08-21 MED ORDER — LOSARTAN 100 MG TABLET
ORAL_TABLET | Freq: Every day | ORAL | 3 refills | 90.00000 days | Status: CP
Start: 2020-08-21 — End: 2021-08-21

## 2020-08-21 MED ORDER — HYDRALAZINE 25 MG TABLET
ORAL_TABLET | Freq: Every day | ORAL | 3 refills | 90.00000 days | Status: CP
Start: 2020-08-21 — End: 2021-08-21

## 2020-08-21 MED ORDER — SIMVASTATIN 10 MG TABLET
ORAL_TABLET | Freq: Every day | ORAL | 3 refills | 90.00000 days | Status: CP
Start: 2020-08-21 — End: 2021-08-22

## 2020-08-21 MED ORDER — ATENOLOL 50 MG TABLET
ORAL_TABLET | Freq: Two times a day (BID) | ORAL | 3 refills | 90.00000 days | Status: CP
Start: 2020-08-21 — End: 2021-08-21

## 2020-09-05 DIAGNOSIS — G8929 Other chronic pain: Principal | ICD-10-CM

## 2020-09-05 DIAGNOSIS — M5440 Lumbago with sciatica, unspecified side: Principal | ICD-10-CM

## 2020-09-05 DIAGNOSIS — M5136 Other intervertebral disc degeneration, lumbar region: Principal | ICD-10-CM

## 2020-09-05 NOTE — Unmapped (Signed)
Message on nurse triage line for refill of percocet last fill date 08/07/20

## 2020-09-05 NOTE — Unmapped (Signed)
Patient called in checking the status on Percocet refill request. Would like a call by the end of the day so he can arrange to go pick up meds. Please call at [phone]

## 2020-09-05 NOTE — Unmapped (Signed)
P/C requesting refill on oxycodone , last ordered on 08/07/20, last visit 05/23/20    Controlled Substance Contract signed on 01/01/2019. UDS obtained on [id number] and was appropriate.

## 2020-09-06 MED ORDER — OXYCODONE-ACETAMINOPHEN 5 MG-325 MG TABLET
ORAL_TABLET | ORAL | 0 refills | 20.00000 days | Status: CP | PRN
Start: 2020-09-06 — End: 2020-10-07

## 2020-10-03 DIAGNOSIS — M5440 Lumbago with sciatica, unspecified side: Principal | ICD-10-CM

## 2020-10-03 DIAGNOSIS — G8929 Other chronic pain: Principal | ICD-10-CM

## 2020-10-03 DIAGNOSIS — M5136 Other intervertebral disc degeneration, lumbar region: Principal | ICD-10-CM

## 2020-10-03 NOTE — Unmapped (Signed)
Patient is requesting the following refill  Requested Prescriptions     Pending Prescriptions Disp Refills   ??? oxyCODONE-acetaminophen (PERCOCET) 5-325 mg per tablet 120 tablet 0     Sig: Take 1 tablet by mouth every four (4) hours as needed for pain. DO NOT FILL UNTIL 04/11/2020       Last refill given on: 09/05/20 with 120 count and no refills.     Last OV: 05/23/2020    Next OV: Visit date not found    Opioid Monitoring   Urine Tox ScreenLast Drug Screen Date: 10/15/2019  Opiate Confirmation Test Last Drug Screen Date: 10/21/2019  Last Opioid Dispensed Provider: Jenell Milliner, MD  Prescribed MEDD : 45  Last PDMP Review: 04/03/2018  9:58 AM  Last Pain Agreement Signed Date: Not Found         Naloxone Ordered: Not Found

## 2020-10-04 MED ORDER — OXYCODONE-ACETAMINOPHEN 5 MG-325 MG TABLET
ORAL_TABLET | ORAL | 0 refills | 20.00000 days | Status: CP | PRN
Start: 2020-10-04 — End: 2020-11-04

## 2020-10-31 DIAGNOSIS — G8929 Other chronic pain: Principal | ICD-10-CM

## 2020-10-31 DIAGNOSIS — M5440 Lumbago with sciatica, unspecified side: Principal | ICD-10-CM

## 2020-10-31 DIAGNOSIS — M5136 Other intervertebral disc degeneration, lumbar region: Principal | ICD-10-CM

## 2020-10-31 NOTE — Unmapped (Signed)
Patient is requesting the following refill  Requested Prescriptions     Pending Prescriptions Disp Refills   ??? oxyCODONE-acetaminophen (PERCOCET) 5-325 mg per tablet 120 tablet 0     Sig: Take 1 tablet by mouth every four (4) hours as needed for pain. DO NOT FILL UNTIL 04/11/2020       Last refill given on: 10/04/20 with 120 count and 0 refills.     Last OV: 05/23/2020    Next OV: Visit date not found    Opioid Monitoring   Urine Tox ScreenLast Drug Screen Date: Not Found  Opiate Confirmation Test Last Drug Screen Date: 10/21/2019  Last Opioid Dispensed Provider: Jenell Milliner, MD  Prescribed MEDD : 45  Last PDMP Review: 04/03/2018  9:58 AM          10/31/20  Last controlled substance agreement 01/01/2019         Naloxone Ordered: Not Found

## 2020-11-03 MED ORDER — OXYCODONE-ACETAMINOPHEN 5 MG-325 MG TABLET
ORAL_TABLET | ORAL | 0 refills | 20.00000 days | Status: CP | PRN
Start: 2020-11-03 — End: 2020-12-04

## 2020-11-28 DIAGNOSIS — M5136 Other intervertebral disc degeneration, lumbar region: Principal | ICD-10-CM

## 2020-11-28 DIAGNOSIS — G8929 Other chronic pain: Principal | ICD-10-CM

## 2020-11-28 DIAGNOSIS — M5440 Lumbago with sciatica, unspecified side: Principal | ICD-10-CM

## 2020-11-28 NOTE — Unmapped (Signed)
Patient is requesting the following refill  Requested Prescriptions     Pending Prescriptions Disp Refills   ??? oxyCODONE-acetaminophen (PERCOCET) 5-325 mg per tablet 120 tablet 0     Sig: Take 1 tablet by mouth every four (4) hours as needed for pain. DO NOT FILL UNTIL 04/11/2020       Last OV: 05/23/2020    Next OV: Visit date not found.     Labs: Not applicable this refill   Last refill given 11/03/20  Set refill date for 12/02/20

## 2020-12-02 MED ORDER — OXYCODONE-ACETAMINOPHEN 5 MG-325 MG TABLET
ORAL_TABLET | ORAL | 0 refills | 20.00000 days | Status: CP | PRN
Start: 2020-12-02 — End: 2021-01-02

## 2020-12-03 NOTE — Unmapped (Signed)
I spent 25 minutes on the phone with the patient on the date of service. I spent an additional 10 minutes on pre- and post-visit activities on the date of service.     The patient was physically located in [place] or a state in which I am permitted to provide care. The patient and/or parent/guardian understood that s/he may incur co-pays and cost sharing, and agreed to the telemedicine visit. The visit was reasonable and appropriate under the circumstances given the patient's presentation at the time.    The patient and/or parent/guardian has been advised of the potential risks and limitations of this mode of treatment (including, but not limited to, the absence of in-person examination) and has agreed to be treated using telemedicine. The patient's/patient's family's questions regarding telemedicine have been answered.     If the visit was completed in an ambulatory setting, the patient and/or parent/guardian has also been advised to contact their provider???s office for worsening conditions, and seek emergency medical treatment and/or call 911 if the patient deems either necessary.      Erik [name] is a 86 y.o. male  is here for   Chief Complaint   Patient presents with   ??? telephone visit   ??? Follow-up       Assessment/Plan:    Breckin was seen today for telephone visit and follow-up.    Diagnoses and all orders for this visit:    Chronic obstructive pulmonary disease, unspecified COPD type (CMS-HCC)    Essential hypertension, benign    DDD (degenerative disc disease), lumbar    Benign prostatic hyperplasia, unspecified whether lower urinary tract symptoms present    Dyslipidemia    Chronic pain syndrome    Tobacco abuse    Coronary artery disease involving autologous vein coronary bypass graft without angina pectoris    Macrocytosis without anemia      -BPH:  History of slow urinary flow- pt started on Flomax 0.4 mg at bedtime.  His symptoms remain stable.   - chronic low back pain in the setting of lumbar DDD: ??He insisted on keeping his percocet Rx at 120 tabs a month. Review of PDMP reflects compliance. ??Annual UDS done in 09/2019, confirmed presence of opioid and his annual controlled substance contract was renewed at that time. His last percocet Rx was dispensed on 12/02/2020.  ??He is due for repeat annual UDS and contract renewal on his next in person visit.   - COPD history: pt continue to smoke occasionally. ??He has no chronic SOB symptoms. ????Most inhalers prescribed have been to costly for the pt. ??He is given Rx for albuterol inhaler to start for prn/rescue purposes. He is advised on not smoking cigarettes. He uses the rescue inhaler about twice a day. ????He wishes to continue with this regimen since other inhalers attempted in the past are very expensive. Inhaler education has been performed.   - history of Giant Cell arteritis: he will continue follow up with his rheumatologist as scheduled along with medication regimen as directed.??He self administers every 2 weeks Actemra injections and he had a rheumatology follow up visit in 07/2019. He spoke to them in 02/2020.  He has a history of ischemic optic neuropathy and he was last seen by [facility] in 2019. Pt states he has his routine eye exam at [facility] optometry and is reminded to schedule an appt soon.   - HTN/CAD/Dyslipidemia history: pt remains normotensive and chest pain free on current medication regimen. ??He had ??normal electrolytes, creatinine  and LFT's in 08/2019.????Lipid panel done in October/2020 revealed HDL/LDL of 51/77. His home BP readings are always normal.  BMP result was normal in June/2021 except for nonfasting glucose of 111.  - macrocytosis noted on CBC in 08/2019: this had been noted in the past. Pt states he was a heavy drinker in the past but more recently reduced his daily alcohol consumption to 3 beers a day.  Repeat CBC done in June/2021 revealed H/H of 13/39 with mildly elevated MCV at 101 with normal ferritin, iron, folate and vitamin B12 levels.  He had normal white blood cell and platelet counts.      Return in about 4 months (around 04/11/2021) for Recheck.    Subjective:     HPI  Telephone encounter conducted today in light of current Covid 81 outbreak.  Last visit was in 07/2020 and follow up recommended for the following HPI:    Chronic obstructive pulmonary disease  Coronary artery disease   Essential hypertension, benign  DDD (degenerative disc disease), lumbar  Dyslipidemia  Macrocytosis without anemia  Chronic pain syndrome  Tobacco abuse  Giant cell arteritis   Benign prostatic hyperplasia   ??  -BPH:  History of slow urinary flow- pt started on Flomax 0.4 mg at bedtime.  His symptoms remain stable.   - chronic low back pain in the setting of lumbar DDD: ??He insisted on keeping his percocet Rx at 120 tabs a month. Review of PDMP reflects compliance. ??Annual UDS done in 09/2019, confirmed presence of opioid and his annual controlled substance contract was renewed at that time. His last percocet Rx was dispensed on 12/02/2020.  ??He is due for repeat annual UDS and contract renewal on his next in person visit.   - COPD history: pt continue to smoke occasionally. ??He has no chronic SOB symptoms. ????Most inhalers prescribed have been to costly for the pt. ??He is given Rx for albuterol inhaler to start for prn/rescue purposes. He is advised on not smoking cigarettes. He uses the rescue inhaler about twice a day. ????He wishes to continue with this regimen since other inhalers attempted in the past are very expensive. Inhaler education has been performed.   - history of Giant Cell arteritis: he will continue follow up with his rheumatologist as scheduled along with medication regimen as directed.??He self administers every 2 weeks Actemra injections and he had a rheumatology follow up visit in 07/2019. He spoke to them in 02/2020.  He has a history of ischemic optic neuropathy and he was last seen by [facility] in 2019. Pt states he has his routine eye exam at [facility] optometry and is reminded to schedule an appt soon.   - HTN/CAD/Dyslipidemia history: pt remains normotensive and chest pain free on current medication regimen. ??He had ??normal electrolytes, creatinine and LFT's in 08/2019.????Lipid panel done in October/2020 revealed HDL/LDL of 51/77. His home BP readings are always normal.  BMP result was normal in June/2021 except for nonfasting glucose of 111.  - macrocytosis noted on CBC in 08/2019: this had been noted in the past. Pt states he was a heavy drinker in the past but more recently reduced his daily alcohol consumption to 3 beers a day.  Repeat CBC done in June/2021 revealed H/H of 13/39 with mildly elevated MCV at 101 with normal ferritin, iron, folate and vitamin B12 levels.  He had normal white blood cell and platelet counts.      ROS  Constitutional:  Denies  unexpected weight loss or gain,  or weakness   Eyes:  Denies visual changes  Respiratory:  Denies cough or shortness of breath. No change in exercise  tolerance  Cardiovascular:  Denies chest pain, palpitations or lower extremity swelling   GI:  Denies abdominal pain, diarrhea, constipation   Musculoskeletal:  Denies myalgias; he has chronic low back pain  Skin:  Denies nonhealing lesions  Neurologic:  Denies headache, focal weakness or numbness, tingling  Endocrine:  Denies polyuria or polydypsia   Psychiatric:  Denies depression, anxiety      Outpatient Medications Prior to Visit   Medication Sig Dispense Refill   ??? amLODIPine (NORVASC) 10 MG tablet Take 1 tablet (10 mg total) by mouth daily. 90 tablet 3   ??? ascorbic acid (VITAMIN C) 500 MG tablet Take 500 mg by mouth daily.     ??? aspirin (ECOTRIN) 81 MG tablet Take 81 mg by mouth daily.     ??? atenoloL (TENORMIN) 50 MG tablet Take 1 tablet (50 mg total) by mouth Two (2) times a day. 180 tablet 3   ??? cyanocobalamin (VITAMIN B-12) 1000 MCG tablet Take 1,000 mcg by mouth daily.     ??? hydrALAZINE (APRESOLINE) 25 MG tablet Take 1 tablet (25 mg total) by mouth daily. 90 tablet 3   ??? losartan (COZAAR) 100 MG tablet Take 1 tablet (100 mg total) by mouth daily. 90 tablet 3   ??? multivitamin (TAB-A-VITE/THERAGRAN) per tablet Take 1 tablet by mouth daily.     ??? oxyCODONE-acetaminophen (PERCOCET) 5-325 mg per tablet Take 1 tablet by mouth every four (4) hours as needed for pain. DO NOT FILL UNTIL 04/11/2020 120 tablet 0   ??? simvastatin (ZOCOR) 10 MG tablet Take 1 tablet (10 mg total) by mouth daily. 90 tablet 3   ??? tamsulosin (FLOMAX) 0.4 mg capsule 1 capsules qhs 30 capsule 11   ??? tocilizumab (ACTEMRA) 162 mg/0.9 mL Syrg subcutaneous injection Inject 0.9 mL (162 mg total) under the skin every fourteen (14) days. 4 mL 3   ??? albuterol HFA 90 mcg/actuation inhaler Inhale 2 puffs every six (6) hours as needed for wheezing. 1 Inhaler 5     No facility-administered medications prior to visit.         Objective:     Total time spent on telephone visit: 25 min  Total time spent pre and post visit planning: 10 min      Allergies:     Patient has no known allergies.    Current Medications:     Current Outpatient Medications   Medication Sig Dispense Refill   ??? amLODIPine (NORVASC) 10 MG tablet Take 1 tablet (10 mg total) by mouth daily. 90 tablet 3   ??? ascorbic acid (VITAMIN C) 500 MG tablet Take 500 mg by mouth daily.     ??? aspirin (ECOTRIN) 81 MG tablet Take 81 mg by mouth daily.     ??? atenoloL (TENORMIN) 50 MG tablet Take 1 tablet (50 mg total) by mouth Two (2) times a day. 180 tablet 3   ??? cyanocobalamin (VITAMIN B-12) 1000 MCG tablet Take 1,000 mcg by mouth daily.     ??? hydrALAZINE (APRESOLINE) 25 MG tablet Take 1 tablet (25 mg total) by mouth daily. 90 tablet 3   ??? losartan (COZAAR) 100 MG tablet Take 1 tablet (100 mg total) by mouth daily. 90 tablet 3   ??? multivitamin (TAB-A-VITE/THERAGRAN) per tablet Take 1 tablet by mouth daily.     ??? oxyCODONE-acetaminophen (PERCOCET) 5-325 mg per tablet Take  1 tablet by mouth every four (4) hours as needed for pain. DO NOT FILL UNTIL 04/11/2020 120 tablet 0   ??? simvastatin (ZOCOR) 10 MG tablet Take 1 tablet (10 mg total) by mouth daily. 90 tablet 3   ??? tamsulosin (FLOMAX) 0.4 mg capsule 1 capsules qhs 30 capsule 11   ??? tocilizumab (ACTEMRA) 162 mg/0.9 mL Syrg subcutaneous injection Inject 0.9 mL (162 mg total) under the skin every fourteen (14) days. 4 mL 3   ??? albuterol HFA 90 mcg/actuation inhaler Inhale 2 puffs every six (6) hours as needed for wheezing. 1 Inhaler 5     No current facility-administered medications for this visit.           Note - This record has been created using [employer]. Chart creation errors have been sought, but may not always have been located. Such creation errors do not reflect on the standard of medical care.    Jenell Milliner, MD

## 2020-12-11 ENCOUNTER — Encounter: Admit: 2020-12-11 | Discharge: 2020-12-12 | Payer: MEDICARE

## 2020-12-11 DIAGNOSIS — M5136 Other intervertebral disc degeneration, lumbar region: Principal | ICD-10-CM

## 2020-12-11 DIAGNOSIS — J449 Chronic obstructive pulmonary disease, unspecified: Principal | ICD-10-CM

## 2020-12-11 DIAGNOSIS — D7589 Other specified diseases of blood and blood-forming organs: Principal | ICD-10-CM

## 2020-12-11 DIAGNOSIS — Z720 Tobacco use: Principal | ICD-10-CM

## 2020-12-11 DIAGNOSIS — E785 Hyperlipidemia, unspecified: Principal | ICD-10-CM

## 2020-12-11 DIAGNOSIS — G894 Chronic pain syndrome: Principal | ICD-10-CM

## 2020-12-11 DIAGNOSIS — I25810 Atherosclerosis of coronary artery bypass graft(s) without angina pectoris: Principal | ICD-10-CM

## 2020-12-11 DIAGNOSIS — N400 Enlarged prostate without lower urinary tract symptoms: Principal | ICD-10-CM

## 2020-12-11 DIAGNOSIS — I10 Essential (primary) hypertension: Principal | ICD-10-CM

## 2020-12-28 NOTE — Unmapped (Addendum)
I have reviewed the following plan of care and agree with the recommendations by the COS. MMRC updated in flow sheet.    Duke Salvia, PharmD.   December 28, 2020 3:33 PM.    ----- Message from Tenna Delaine sent at 12/28/2020  9:07 AM EST -----  Regarding: [facility] callback  The following patient returned our call for [facility] Symptom Assessment. Please update chart.    Please select the number which best describes how your breathing affects your life???   1 = I get short of breath when hurrying on level ground or walking up a slight hill      Tenna Delaine, CPhT  Clinical Operations Specialist   Assessment of Medications Program (CAMP)  p [phone]  -  f [phone]

## 2020-12-28 NOTE — Unmapped (Signed)
[place] Assessment of Medications Program (CAMP)                   MMRC SUMMARY NOTE      Patient was outreached via phone call for CAMP Services for their annual MMRC. Unable to contact- unable to leave message.        Shanon Brow, PharmD, CPP  Clinical Pharmacist, [place] Assessment of Medications Program (CAMP)  CAMP Clinic: [phone] - Fax: [phone]

## 2020-12-29 DIAGNOSIS — G8929 Other chronic pain: Principal | ICD-10-CM

## 2020-12-29 DIAGNOSIS — M5136 Other intervertebral disc degeneration, lumbar region: Principal | ICD-10-CM

## 2020-12-29 DIAGNOSIS — M5440 Lumbago with sciatica, unspecified side: Principal | ICD-10-CM

## 2020-12-29 NOTE — Unmapped (Signed)
Patient is requesting the following refill  Requested Prescriptions     Pending Prescriptions Disp Refills   ??? oxyCODONE-acetaminophen (PERCOCET) 5-325 mg per tablet 120 tablet 0     Sig: Take 1 tablet by mouth every four (4) hours as needed for pain. DO NOT FILL UNTIL 04/11/2020       Last refill given on: 12/02/20 with 120 count and 0 refills.     Last OV: 05/23/2020     Last Virtual Visit: Visit date not found     Next OV:04/16/21    Opioid Monitoring   Urine Tox ScreenLast Drug Screen Date: Not Found   10/15/2019  Opiate Confirmation Test Last Drug Screen Date: 10/21/2019  Last Opioid Dispensed Provider: Jenell Milliner, MD  Prescribed MEDD : 45  Last PDMP Review: 04/03/2018  9:58 AM  12/29/2020  Last Pain Agreement Signed Date: Not Found 01/01/2019         Naloxone Ordered: Not Found

## 2021-01-01 DIAGNOSIS — G8929 Other chronic pain: Principal | ICD-10-CM

## 2021-01-01 DIAGNOSIS — M5440 Lumbago with sciatica, unspecified side: Principal | ICD-10-CM

## 2021-01-01 DIAGNOSIS — M5136 Other intervertebral disc degeneration, lumbar region: Principal | ICD-10-CM

## 2021-01-01 NOTE — Unmapped (Signed)
Patient is requesting the following refill  Requested Prescriptions     Pending Prescriptions Disp Refills   ??? oxyCODONE-acetaminophen (PERCOCET) 5-325 mg per tablet 120 tablet 0     Sig: Do not fill before 01/02/2021       Last refill given on: 12/02/20 with 120 count and 0 refills.     Last OV: 05/23/2020     Last Virtual Visit: Visit date not found     Next OV: Visit date not found    Opioid Monitoring   Urine Tox ScreenLast Drug Screen Date: Not Found  Opiate Confirmation Test Last Drug Screen Date: 10/21/2019  Last Opioid Dispensed Provider: Jenell Milliner, MD  Prescribed MEDD : 45  Last PDMP Review: 04/03/2018  9:58 AM  Last Pain Agreement Signed Date: Not Found         Naloxone Ordered: Not Found     Initial refill went with incorrect do not fill until date

## 2021-01-02 MED ORDER — OXYCODONE-ACETAMINOPHEN 5 MG-325 MG TABLET
ORAL_TABLET | ORAL | 0 refills | 0.00000 days | Status: CP | PRN
Start: 2021-01-02 — End: 2021-02-02

## 2021-01-24 DIAGNOSIS — J449 Chronic obstructive pulmonary disease, unspecified: Principal | ICD-10-CM

## 2021-01-24 MED ORDER — ALBUTEROL SULFATE HFA 90 MCG/ACTUATION AEROSOL INHALER
Freq: Four times a day (QID) | RESPIRATORY_TRACT | 0 refills | 0 days | Status: CP | PRN
Start: 2021-01-24 — End: 2022-01-24

## 2021-01-29 DIAGNOSIS — G8929 Other chronic pain: Principal | ICD-10-CM

## 2021-01-29 DIAGNOSIS — M5440 Lumbago with sciatica, unspecified side: Principal | ICD-10-CM

## 2021-01-29 DIAGNOSIS — M5136 Other intervertebral disc degeneration, lumbar region: Principal | ICD-10-CM

## 2021-01-29 NOTE — Unmapped (Signed)
Patient is requesting the following refill  Requested Prescriptions     Pending Prescriptions Disp Refills   ??? oxyCODONE-acetaminophen (PERCOCET) 5-325 mg per tablet 120 tablet 0     Sig: Do not fill before 01/02/2021       Last OV: 05/23/2020     Last Virtual Visit: Visit date not found     Next OV: Visit date not found.     Labs: Not applicable this refill    last filled 01/02/21 do not fill until 02/01/21

## 2021-01-30 DIAGNOSIS — M5440 Lumbago with sciatica, unspecified side: Principal | ICD-10-CM

## 2021-01-30 DIAGNOSIS — M5136 Other intervertebral disc degeneration, lumbar region: Principal | ICD-10-CM

## 2021-01-30 DIAGNOSIS — G8929 Other chronic pain: Principal | ICD-10-CM

## 2021-02-01 MED ORDER — OXYCODONE-ACETAMINOPHEN 5 MG-325 MG TABLET
ORAL_TABLET | ORAL | 0 refills | 0.00000 days | Status: CP
Start: 2021-02-01 — End: 2021-01-30

## 2021-02-16 DIAGNOSIS — R39198 Other difficulties with micturition: Principal | ICD-10-CM

## 2021-02-16 MED ORDER — TAMSULOSIN 0.4 MG CAPSULE
ORAL_CAPSULE | 0 refills | 0 days | Status: CP
Start: 2021-02-16 — End: ?

## 2021-02-20 NOTE — Unmapped (Signed)
Reason for call:     Pt is requesting refill for Actemra syringe as he wull be taking his last dose on Thursday. He stated that pharmacy told him they have requested RX.     Pt is concerned about not having medication due to his eyesight.     Last ov: Visit date not found  Next ov: Visit date not found

## 2021-02-21 MED ORDER — ACTEMRA 162 MG/0.9 ML SUBCUTANEOUS SYRINGE
SUBCUTANEOUS | 3 refills | 56.00000 days | Status: CP
Start: 2021-02-21 — End: ?

## 2021-02-21 NOTE — Unmapped (Signed)
Spoke to patient and have him scheduled with Delorise Shiner for 02/27/21. Pt lost his daughter a few months ago and she was his primary transport so he asked for a phone visit. I hope that was okay. He indicated that he is doing good, just needed the refill.     Thank you,  Nettie Elm

## 2021-02-21 NOTE — Unmapped (Signed)
Actemra refill  Last ov: 02/22/2020  Next ov: Visit date not found     Please refill if appropriate.     Labs:   AST   Date Value Ref Range Status   09/16/2019 34 19 - 55 U/L Final     ALT   Date Value Ref Range Status   09/16/2019 26 <50 U/L Final     Creatinine Whole Blood, POC   Date Value Ref Range Status   10/11/2017 0.8 0.8 - 1.4 mg/dL Final     Creatinine   Date Value Ref Range Status   05/23/2020 1.09 0.70 - 1.30 mg/dL Final     WBC   Date Value Ref Range Status   05/23/2020 7.6 4.5 - 11.0 10*9/L Final     HGB   Date Value Ref Range Status   05/23/2020 13.4 (L) 13.5 - 17.5 g/dL Final     HCT   Date Value Ref Range Status   05/23/2020 39.9 (L) 41.0 - 53.0 % Final     MCV   Date Value Ref Range Status   05/23/2020 101.2 (H) 80.0 - 100.0 fL Final     RDW   Date Value Ref Range Status   05/23/2020 12.7 12.0 - 15.0 % Final     Platelet   Date Value Ref Range Status   05/23/2020 230 150 - 440 10*9/L Final     Neutrophils %   Date Value Ref Range Status   09/16/2019 58.3 % Final     Lymphocytes %   Date Value Ref Range Status   09/16/2019 25.0 % Final     Monocytes %   Date Value Ref Range Status   09/16/2019 7.9 % Final     Eosinophils %   Date Value Ref Range Status   09/16/2019 5.8 % Final     Basophils %   Date Value Ref Range Status   09/16/2019 0.6 % Final

## 2021-02-21 NOTE — Unmapped (Signed)
Addended by: Gerrit Halls on: 02/21/2021 12:31 PM     Modules accepted: Orders

## 2021-02-21 NOTE — Unmapped (Signed)
Addended by: Dow Adolph A on: 02/21/2021 02:28 PM     Modules accepted: Orders

## 2021-02-22 NOTE — Unmapped (Signed)
Enrollment forms for actemra expired with [employer] in December. Mechele Collin from [place] please call [phone] option 6 to get pt enrolled

## 2021-02-23 DIAGNOSIS — M316 Other giant cell arteritis: Principal | ICD-10-CM

## 2021-02-23 MED ORDER — ACTEMRA 162 MG/0.9 ML SUBCUTANEOUS SYRINGE
SUBCUTANEOUS | 3 refills | 84.00000 days | Status: CP
Start: 2021-02-23 — End: ?

## 2021-02-23 NOTE — Unmapped (Signed)
patient wants someone to call him and give an update of the [employer] app-Actemra because he only has 13 days before his next dose and he depends on this for his eyesight. he is completely out.

## 2021-02-26 NOTE — Unmapped (Signed)
Patient notified his information has been submitted to the pharmacy team for processing.   Patient also confirmed phone appointment for 02/27/2021

## 2021-02-26 NOTE — Unmapped (Signed)
[facility] Telephone Visit Note    Assessment/Recommendations:   87 y.o. smoker with h/o COPD, CAD s/p CABG,  HTN, active alcohol use, GIB 2/2 duodenal ulcer (likely 2/2 concurrent ibuprofen, alcohol and prednisone use) presenting for follow-up of  biopsy proven giant cell arteritis  complicated by ischemic optic neuropathy/ vision loss in left eye (dx 11/2017)    He was last seen 02/22/20 as telemedicine visit with Dr. Ilsa Iha    Currently taking tocilizumab 162 mg SQ q other week and tolerating without any side effects. Reports persistent vision loss of left eye. No new vision changes, headaches, temporal artery tenderness, scalp tenderness, jaw pain/claudication, shoulder/hip stiffness, fevers, night sweat, weight loss, excessive fatigue of concern.     - Continue tocilizumab 162 mg subcutaneous q2weeks. Refill sent in 02/23/21 by Dr. Ilsa Iha  - Update lab monitoring: CBCw/diff, Crt, AST, ALT, ESR, CRP. Pt agrees to complete at PCP. Orders placed.   -  Of note, patient with known h/o GIB/duodenal ulcer which does increase risk of GI perforation on tocilizumab - however, he is now >18 months out from this without any complications and wishes to continue treatment with tocilizumab  - Encouraged pt to receive third COVID-19 vaccine locally ASAP. Discussed option of Evusheld 2 weeks post vaccine. Will mail Evusheld pt fact sheet to pt to review. Asked pt to notify clinic if he'd like to receive 2 weeks post vaccine.     F/U in 6-8 months with Dr. Ilsa Iha          The patient reports they are currently: at home. I spent 21 minutes on the phone visit with the patient on the date of service. I spent an additional 5 minutes on pre- and post-visit activities on the date of service.     The patient was not located and I was located within 250 yards of a hospital based location during the phone visit. The patient was physically located in [place] or a state in which I am permitted to provide care. The patient and/or parent/guardian understood that s/he may incur co-pays and cost sharing, and agreed to the telemedicine visit. The visit was reasonable and appropriate under the circumstances given the patient's presentation at the time.    The patient and/or parent/guardian has been advised of the potential risks and limitations of this mode of treatment (including, but not limited to, the absence of in-person examination) and has agreed to be treated using telemedicine. The patient's/patient's family's questions regarding telemedicine have been answered.    If the visit was completed in an ambulatory setting, the patient and/or parent/guardian has also been advised to contact their provider???s office for worsening conditions, and seek emergency medical treatment and/or call 911 if the patient deems either necessary.      ___________________________________________________________________    Primary Care Provider: Jenell Milliner, MD    Reason for Visit: F/U [facility]     Identification: Pt self identified using name and date of birth    Patient location: [place]    HPI:  Erik [name] is a 87 y.o. smoker with h/o COPD, CAD s/p CABG,  HTN,  ongoing alcohol use and GIB 2/2 duodenal ulcer (likely 2/2 concurrent ibuprofen, alcohol and prednisone use) presenting for follow-up of biopsy proven giant cell arteritis complicated by ischemic optic neuropathy/ vision loss in left eye.       Last seen 02/22/20 as telemedicine visit with Dr. Ilsa Iha    He is taking Actemra 162 mg SQ weekly (takes every  Thursday).     Interim History:   Pt presents for f/u via phone today. He reports he has been well since last visit. He denies recent vision changes. He is unable to see out of L eye which is chronic. His R eye has been good. He reports occasionally noticing a film to this eye which resolves with an eye drop, may happen once a week. He denies HA, temporal tenderness, or jaw claudication.    He is taking Actemra every other week and tolerating well. He reports some chronic back pain but denies much AM stiffness. He denies hip or shoulder pain.     He denies recent fever or chills. He denies recent illness or infections. He denies unexplained weight loss.     He had two COVID vaccines but no booster yet. He struggles to get to visits/appointments since his daughter passed away 7 months ago. She was his main source of transportation previously. His sons help now but work.     Review of Systems:   All other systems were reviewed and were negative    Medical History:  Past Medical History:   Diagnosis Date   ??? Cataract 2005    cataract surgery   ??? COPD (chronic obstructive pulmonary disease) (CMS-HCC) 10/07/2016   ??? DJD (degenerative joint disease), lumbar    ??? Hypertension    ??? Tobacco abuse 02/15/2014       Allergies:  Patient has no known allergies.    Medications:     Current Outpatient Medications:   ???  albuterol HFA 90 mcg/actuation inhaler, Inhale 2 puffs every six (6) hours as needed for wheezing., Disp: 9 g, Rfl: 0  ???  amLODIPine (NORVASC) 10 MG tablet, Take 1 tablet (10 mg total) by mouth daily., Disp: 90 tablet, Rfl: 3  ???  ascorbic acid (VITAMIN C) 500 MG tablet, Take 500 mg by mouth daily., Disp: , Rfl:   ???  aspirin (ECOTRIN) 81 MG tablet, Take 81 mg by mouth daily., Disp: , Rfl:   ???  atenoloL (TENORMIN) 50 MG tablet, Take 1 tablet (50 mg total) by mouth Two (2) times a day., Disp: 180 tablet, Rfl: 3  ???  cyanocobalamin (VITAMIN B-12) 1000 MCG tablet, Take 1,000 mcg by mouth daily., Disp: , Rfl:   ???  hydrALAZINE (APRESOLINE) 25 MG tablet, Take 1 tablet (25 mg total) by mouth daily., Disp: 90 tablet, Rfl: 3  ???  losartan (COZAAR) 100 MG tablet, Take 1 tablet (100 mg total) by mouth daily., Disp: 90 tablet, Rfl: 3  ???  multivitamin (TAB-A-VITE/THERAGRAN) per tablet, Take 1 tablet by mouth daily., Disp: , Rfl:   ???  oxyCODONE-acetaminophen (PERCOCET) 5-325 mg per tablet, Take 1 tablet by mouth every four (4) hours as needed for pain.  Do not fill before 02/01/2021, Disp: 120 tablet, Rfl: 0  ???  simvastatin (ZOCOR) 10 MG tablet, Take 1 tablet (10 mg total) by mouth daily., Disp: 90 tablet, Rfl: 3  ???  tamsulosin (FLOMAX) 0.4 mg capsule, TAKE 1 CAPSULE BY MOUTH EVERY DAY AT BEDTIME, Disp: 30 capsule, Rfl: 0  ???  tocilizumab (ACTEMRA) 162 mg/0.9 mL Syrg subcutaneous injection, Inject 0.9 mL (162 mg total) under the skin every fourteen (14) days., Disp: 6 mL, Rfl: 3    Surgical History:  Past Surgical History:   Procedure Laterality Date   ??? CARDIAC VALVE REPLACEMENT      triple bypass   ??? CATARACT EXTRACTION     ??? CORONARY ARTERY BYPASS GRAFT  2001    x3   ??? EYE SURGERY  2005    cataracts   ??? PR REPAIR ING HERNIA,5+Y/O,REDUCIBL Left 06/19/2015    Procedure: REPAIR INITIAL INGUINAL HERNIA, AGE 21 YEARS OR OLDER; REDUCIBLE;  Surgeon: Shelda Jakes, MD;  [facility]: [facility];  Service: Trauma   ??? PR TEMPORAL ARTERY LIGATN OR BX Left 10/23/2017    Procedure: LIGATION OR BIOPSY, TEMPORAL ARTERY;  Surgeon: Vivien Rossetti, MD;  [facility];  Service: ENT   ??? PR UPPER GI ENDOSCOPY,DIAGNOSIS N/A 12/17/2017    Procedure: UGI ENDO, INCLUDE ESOPHAGUS, STOMACH, & DUODENUM &/OR JEJUNUM; DX W/WO COLLECTION SPECIMN, BY BRUSH OR WASH;  Surgeon: Bluford Kaufmann, MD;  [facility];  Service: Gastroenterology       Social History:  Lives in [place], [place]; lives with daughter  Social History     Tobacco Use   ??? Smoking status: Current Every Day Smoker     Packs/day: 0.50     Years: 4.00     Pack years: 2.00   ??? Smokeless tobacco: Never Used   ??? Tobacco comment: quit for 25 years, restarted 2014   Vaping Use   ??? Vaping Use: Never used   Substance Use Topics   ??? Alcohol use: Yes     Alcohol/week: 2.0 standard drinks     Types: 2 Cans of beer per week     Comment: daily   ??? Drug use: No       Family History:  Family History   Problem Relation Age of Onset   ??? Thyroid disease Sister    ??? Cancer Brother    ??? Glaucoma Neg Hx    ??? Macular degeneration Neg Hx    ??? Retinal detachment Neg Hx    ??? Substance Abuse Disorder Neg Hx    ??? Mental illness Neg Hx        Physical Exam:  General:  Alert and interactive; does not sound in distress  Pulmonary:  Speaking in full sentences; no cough, no wheezing  Psych: Mood and affect appropriate and congruent       Test Results    Final Diagnosis   A: Temporal artery, left, biopsy   - Segment of temporal artery, positive for active temporal arteritis.

## 2021-02-27 ENCOUNTER — Encounter: Admit: 2021-02-27 | Discharge: 2021-02-28 | Payer: MEDICARE | Attending: Family | Primary: Family

## 2021-02-27 DIAGNOSIS — M5440 Lumbago with sciatica, unspecified side: Principal | ICD-10-CM

## 2021-02-27 DIAGNOSIS — G8929 Other chronic pain: Principal | ICD-10-CM

## 2021-02-27 DIAGNOSIS — M5136 Other intervertebral disc degeneration, lumbar region: Principal | ICD-10-CM

## 2021-02-27 NOTE — Unmapped (Signed)
Patient is requesting the following refill  Requested Prescriptions     Pending Prescriptions Disp Refills   ??? oxyCODONE-acetaminophen (PERCOCET) 5-325 mg per tablet 120 tablet 0     Sig: Take 1 tablet by mouth every four (4) hours as needed for pain.   Do not fill before 02/01/2021       Recent Visits  Date Type Provider Dept   05/23/20 Office Visit Jenell Milliner, MD [facility]   Showing recent visits within past 365 days with a meds authorizing provider and meeting all other requirements  Future Appointments  Date Type Provider Dept   04/16/21 Appointment Jenell Milliner, MD [facility]   Showing future appointments within next 365 days with a meds authorizing provider and meeting all other requirements       Labs: Not applicable this refill   Last refill 02/02/20

## 2021-02-28 MED ORDER — OXYCODONE-ACETAMINOPHEN 5 MG-325 MG TABLET
ORAL_TABLET | ORAL | 0 refills | 0.00000 days | Status: CN
Start: 2021-02-28 — End: 2022-02-27

## 2021-03-19 DIAGNOSIS — R39198 Other difficulties with micturition: Principal | ICD-10-CM

## 2021-03-19 MED ORDER — TAMSULOSIN 0.4 MG CAPSULE
ORAL_CAPSULE | 3 refills | 0 days | Status: CP
Start: 2021-03-19 — End: ?

## 2021-03-29 DIAGNOSIS — G8929 Other chronic pain: Principal | ICD-10-CM

## 2021-03-29 DIAGNOSIS — M5440 Lumbago with sciatica, unspecified side: Principal | ICD-10-CM

## 2021-03-29 DIAGNOSIS — M5136 Other intervertebral disc degeneration, lumbar region: Principal | ICD-10-CM

## 2021-03-29 NOTE — Unmapped (Signed)
Patient is requesting the following refill  Requested Prescriptions     Pending Prescriptions Disp Refills   ??? oxyCODONE-acetaminophen (PERCOCET) 5-325 mg per tablet 120 tablet 0     Sig: Take 1 tablet by mouth every four (4) hours as needed for pain.   Do not fill before 02/01/2021       Recent Visits  Date Type Provider Dept   05/23/20 Office Visit Jenell Milliner, MD [facility]   Showing recent visits within past 365 days with a meds authorizing provider and meeting all other requirements  Future Appointments  Date Type Provider Dept   04/16/21 Appointment Jenell Milliner, MD [facility]   Showing future appointments within next 365 days with a meds authorizing provider and meeting all other requirements       Labs: Not applicable this refill    last refill given 03/02/21

## 2021-03-30 NOTE — Unmapped (Signed)
I filled it yesterday

## 2021-04-01 MED ORDER — OXYCODONE-ACETAMINOPHEN 5 MG-325 MG TABLET
ORAL_TABLET | 0 refills | 29 days | Status: CP
Start: 2021-04-01 — End: 2021-04-30

## 2021-04-05 MED ORDER — OXYCODONE-ACETAMINOPHEN 5 MG-325 MG TABLET
ORAL_TABLET | ORAL | 0 refills | 3.00000 days | Status: CP | PRN
Start: 2021-04-05 — End: ?

## 2021-04-05 NOTE — Unmapped (Signed)
Pt notified of refill

## 2021-04-05 NOTE — Unmapped (Signed)
Pt called stating his pharmacy [facility] partially filled his prescription of Percocet 5-325 they filled for 104 tablets he states he is missing 16 tablets, and [facility] told him he will need a new prescription to cover the 16 tablets missing    Called [facility] in [place] at [phone] they stated they gave [place] the option to wait for the full quantity or take what they got, he decided to take what they had in stock, since [facility] has dispensed off of the prescription, he will need a new prescription for a quantity of 16 tabs to total the 120 he was originally prescribed     Please advise

## 2021-04-05 NOTE — Unmapped (Signed)
Patient is requesting the following refill Oxycodone-Acetaminophen  Requested Prescriptions      No prescriptions requested or ordered in this encounter       Last refill given on: 04/01/21 with 104 count and 0 refills.     Recent Visits  Date Type Provider Dept   05/23/20 Office Visit Jenell Milliner, MD [facility]   Showing recent visits within past 365 days with a meds authorizing provider and meeting all other requirements  Future Appointments  Date Type Provider Dept   04/16/21 Appointment Jenell Milliner, MD [facility]   Showing future appointments within next 365 days with a meds authorizing provider and meeting all other requirements       Opioid Monitoring   Urine Tox ScreenLast Drug Screen Date: Not Found  Opiate Confirmation Test Last Drug Screen Date: 10/21/2019  Last Opioid Dispensed Provider: Jenell Milliner, MD  Prescribed MEDD : [phone]  Last PDMP Review: 04/03/2018  9:58 AM  Last Pain Agreement Signed Date: Not Found         Naloxone Ordered: Not Found  Last OV: 05/23/2020

## 2021-04-09 NOTE — Unmapped (Signed)
The patient reports they are currently: at home. I spent 24 minutes on the phone with the patient on the date of service. I spent an additional 10 minutes on pre- and post-visit activities on the date of service.     The patient was physically located in [place] or a state in which I am permitted to provide care. The patient and/or parent/guardian understood that s/he may incur co-pays and cost sharing, and agreed to the telemedicine visit. The visit was reasonable and appropriate under the circumstances given the patient's presentation at the time.    The patient and/or parent/guardian has been advised of the potential risks and limitations of this mode of treatment (including, but not limited to, the absence of in-person examination) and has agreed to be treated using telemedicine. The patient's/patient's family's questions regarding telemedicine have been answered.     If the visit was completed in an ambulatory setting, the patient and/or parent/guardian has also been advised to contact their provider???s office for worsening conditions, and seek emergency medical treatment and/or call 911 if the patient deems either necessary.        Erik [name] is a 87 y.o. male  is here for   Chief Complaint   Patient presents with   ??? Follow-up       Assessment/Plan:    [name] was seen today for follow-up.    Diagnoses and all orders for this visit:    Essential hypertension, benign  -     Comprehensive Metabolic Panel; Future  -     Lipid Panel; Future    Dyslipidemia  -     Comprehensive Metabolic Panel; Future  -     Lipid Panel; Future    Coronary artery disease involving autologous vein coronary bypass graft without angina pectoris    Macrocytosis without anemia  -     CBC; Future    DDD (degenerative disc disease), lumbar  -     Toxicology Screen, Urine; Future    Chronic bilateral low back pain with sciatica, sciatica laterality unspecified  -     Toxicology Screen, Urine; Future    Medication management  -     Toxicology Screen, Urine; Future    Giant cell arteritis (CMS-HCC)    Chronic obstructive pulmonary disease, unspecified COPD type (CMS-HCC)    Benign prostatic hyperplasia, unspecified whether lower urinary tract symptoms present    Tobacco abuse    Chronic pain syndrome      -BPH:  History of slow urinary flow- pt started on Flomax 0.4 mg at bedtime.  His symptoms remain stable.     - chronic low back pain in the setting of lumbar DDD: ??He insisted on keeping his percocet Rx at 120 tabs a month. Review of PDMP reflects compliance. ??Annual UDS done in 09/2019, confirmed presence of opioid and his annual controlled substance contract was renewed at that time. Review of pt's PDMP reflects med compliance.    ??He is due for repeat annual UDS and contract renewal today.      - COPD history: pt continue to smoke occasionally. ??He has no chronic SOB symptoms. ????Most inhalers prescribed have been to costly for the pt. ??He is given Rx for albuterol inhaler to start for prn/rescue purposes. He is advised on not smoking cigarettes. He uses the rescue inhaler about twice a day. ????He wishes to continue with this regimen since other inhalers attempted in the past are very expensive. Inhaler education has been performed in the  past.     - history of Giant Cell arteritis: he will continue follow up with his rheumatologist as scheduled along with medication regimen as directed.??He self administers every 2 weeks Actemra injections and he had a rheumatology follow up visit in 02/2021. He has a history of ischemic optic neuropathy and he was last seen by [facility] in 2019. Pt states he has his routine eye exam at [facility] optometry and is reminded to schedule an appt soon. Las rheumatology encounter details are as follows:   Assessment/Recommendations:   87 y.o. smoker with h/o COPD, CAD s/p CABG,  HTN, active alcohol use, GIB 2/2 duodenal ulcer (likely 2/2 concurrent ibuprofen, alcohol and prednisone use) presenting for follow-up of  biopsy proven giant cell arteritis  complicated by ischemic optic neuropathy/ vision loss in left eye (dx 11/2017)  He was last seen 02/22/20 as telemedicine visit with Dr. Ilsa Iha  Currently taking tocilizumab 162 mg SQ q other week and tolerating without any side effects. Reports persistent vision loss of left eye. No new vision changes, headaches, temporal artery tenderness, scalp tenderness, jaw pain/claudication, shoulder/hip stiffness, fevers, night sweat, weight loss, excessive fatigue of concern.   - Continue tocilizumab 162 mg subcutaneous q2weeks. Refill sent in 02/23/21 by Dr. Ilsa Iha  - Update lab monitoring: CBCw/diff, Crt, AST, ALT, ESR, CRP. Pt agrees to complete at PCP. Orders placed.   -  Of note, patient with known h/o GIB/duodenal ulcer which does increase risk of GI perforation on tocilizumab - however, he is now >18 months out from this without any complications and wishes to continue treatment with tocilizumab  - Encouraged pt to receive third COVID-19 vaccine locally ASAP. Discussed option of Evusheld 2 weeks post vaccine. Will mail Evusheld pt fact sheet to pt to review. Asked pt to notify clinic if he'd like to receive 2 weeks post vaccine.   F/U in 6-8 months with Dr. Ilsa Iha         - HTN/CAD/Dyslipidemia history: pt remains normotensive and chest pain free on current medication regimen. ??He had ??normal electrolytes, creatinine and LFT's in 08/2019.????Lipid panel done in October/2020 revealed HDL/LDL of 51/77. His home BP readings are always normal.  BMP result was normal in June/2021 except for nonfasting glucose of 111. He will return for annual CMP and  Lipid check.     - macrocytosis noted on CBC in 08/2019: this had been noted in the past. Pt states he was a heavy drinker in the past but more recently reduced his daily alcohol consumption to 3 beers a day.  Repeat CBC done in June/2021 revealed H/H of 13/39 with mildly elevated MCV at 101 with normal ferritin, iron, folate and vitamin B12 levels.  He had normal white blood cell and platelet counts.    He is due for repeat CBC today and he will return for lab appt this week,  for this along with annual UDS, CMP and lipid panel (future lab order done).       Next visit will need to be an in person visit and he can renew his Controlled substance contract then.      60 min pt encounter, greater then 50% counseling and coordination of care, review of medical history/records, recent specialists encounters, pre visit planning and post visit documentation.      Return in about 4 months (around 08/16/2021) for Recheck.  In person visit then and fasting lab visit this week.        Subjective:  HPI  The pt is seen today for routine telemedicine follow up visit. We  had a telemedicine encounter in 11/2020 for the following conditions:  Chronic obstructive pulmonary disease  Essential hypertension, benign  DDD (degenerative disc disease), lumbar  Benign prostatic hyperplasia  Dyslipidemia  Chronic pain syndrome  Tobacco abuse  Coronary artery disease   Macrocytosis without anemia      -BPH:  History of slow urinary flow- pt started on Flomax 0.4 mg at bedtime.  His symptoms remain stable.     - chronic low back pain in the setting of lumbar DDD: ??He insisted on keeping his percocet Rx at 120 tabs a month. Review of PDMP reflects compliance. ??Annual UDS done in 09/2019, confirmed presence of opioid and his annual controlled substance contract was renewed at that time. Review of pt's PDMP reflects med compliance.    ??He is due for repeat annual UDS and contract renewal today.      - COPD history: pt continue to smoke occasionally. ??He has no chronic SOB symptoms. ????Most inhalers prescribed have been to costly for the pt. ??He is given Rx for albuterol inhaler to start for prn/rescue purposes. He is advised on not smoking cigarettes. He uses the rescue inhaler about twice a day. ????He wishes to continue with this regimen since other inhalers attempted in the past are very expensive. Inhaler education has been performed in the past.     - history of Giant Cell arteritis: he will continue follow up with his rheumatologist as scheduled along with medication regimen as directed.??He self administers every 2 weeks Actemra injections and he had a rheumatology follow up visit in 02/2021. He has a history of ischemic optic neuropathy and he was last seen by [facility] in 2019. Pt states he has his routine eye exam at [facility] optometry and is reminded to schedule an appt soon. Las rheumatology encounter details are as follows:   Assessment/Recommendations:   87 y.o. smoker with h/o COPD, CAD s/p CABG,  HTN, active alcohol use, GIB 2/2 duodenal ulcer (likely 2/2 concurrent ibuprofen, alcohol and prednisone use) presenting for follow-up of  biopsy proven giant cell arteritis  complicated by ischemic optic neuropathy/ vision loss in left eye (dx 11/2017)  He was last seen 02/22/20 as telemedicine visit with Dr. Ilsa Iha  Currently taking tocilizumab 162 mg SQ q other week and tolerating without any side effects. Reports persistent vision loss of left eye. No new vision changes, headaches, temporal artery tenderness, scalp tenderness, jaw pain/claudication, shoulder/hip stiffness, fevers, night sweat, weight loss, excessive fatigue of concern.   - Continue tocilizumab 162 mg subcutaneous q2weeks. Refill sent in 02/23/21 by Dr. Ilsa Iha  - Update lab monitoring: CBCw/diff, Crt, AST, ALT, ESR, CRP. Pt agrees to complete at PCP. Orders placed.   -  Of note, patient with known h/o GIB/duodenal ulcer which does increase risk of GI perforation on tocilizumab - however, he is now >18 months out from this without any complications and wishes to continue treatment with tocilizumab  - Encouraged pt to receive third COVID-19 vaccine locally ASAP. Discussed option of Evusheld 2 weeks post vaccine. Will mail Evusheld pt fact sheet to pt to review. Asked pt to notify clinic if he'd like to receive 2 weeks post vaccine.   F/U in 6-8 months with Dr. Ilsa Iha         - HTN/CAD/Dyslipidemia history: pt remains normotensive and chest pain free on current medication regimen. ??He had ??normal electrolytes, creatinine and LFT's in 08/2019.????Lipid  panel done in October/2020 revealed HDL/LDL of 51/77. His home BP readings are always normal.  BMP result was normal in June/2021 except for nonfasting glucose of 111. He will return for annual CMP and  Lipid check.     - macrocytosis noted on CBC in 08/2019: this had been noted in the past. Pt states he was a heavy drinker in the past but more recently reduced his daily alcohol consumption to 3 beers a day.  Repeat CBC done in June/2021 revealed H/H of 13/39 with mildly elevated MCV at 101 with normal ferritin, iron, folate and vitamin B12 levels.  He had normal white blood cell and platelet counts.    He is due for repeat CBC today and he will return for lab appt for this along with annual UDS.       Next visit will need to be an in person visit and he can renew his Controlled substance contract then.      ROS  Constitutional:  Denies  unexpected weight loss or gain, or weakness   Eyes:  Denies visual changes  Respiratory:  Denies cough or shortness of breath. No change in exercise  tolerance  Cardiovascular:  Denies chest pain, palpitations or lower extremity swelling   GI:  Denies abdominal pain, diarrhea, constipation   Musculoskeletal:  Denies myalgias  Skin:  Denies nonhealing lesions  Neurologic:  Denies headache, focal weakness or numbness, tingling  Endocrine:  Denies polyuria or polydypsia   Psychiatric:  Denies depression, anxiety      Outpatient Medications Prior to Visit   Medication Sig Dispense Refill   ??? albuterol HFA 90 mcg/actuation inhaler Inhale 2 puffs every six (6) hours as needed for wheezing. 9 g 0   ??? amLODIPine (NORVASC) 10 MG tablet Take 1 tablet (10 mg total) by mouth daily. 90 tablet 3   ??? ascorbic acid (VITAMIN C) 500 MG tablet Take 500 mg by mouth daily.     ??? aspirin (ECOTRIN) 81 MG tablet Take 81 mg by mouth daily.     ??? atenoloL (TENORMIN) 50 MG tablet Take 1 tablet (50 mg total) by mouth Two (2) times a day. 180 tablet 3   ??? cyanocobalamin (VITAMIN B-12) 1000 MCG tablet Take 1,000 mcg by mouth daily.     ??? hydrALAZINE (APRESOLINE) 25 MG tablet Take 1 tablet (25 mg total) by mouth daily. 90 tablet 3   ??? losartan (COZAAR) 100 MG tablet Take 1 tablet (100 mg total) by mouth daily. 90 tablet 3   ??? multivitamin (TAB-A-VITE/THERAGRAN) per tablet Take 1 tablet by mouth daily.     ??? oxyCODONE-acetaminophen (PERCOCET) 5-325 mg per tablet Take 1 tablet by mouth every four (4) hours as needed for pain. 120 tablet 0   ??? oxyCODONE-acetaminophen (PERCOCET) 5-325 mg per tablet Take 1 tablet by mouth every four (4) hours as needed for pain. 16 tablet 0   ??? simvastatin (ZOCOR) 10 MG tablet Take 1 tablet (10 mg total) by mouth daily. 90 tablet 3   ??? tamsulosin (FLOMAX) 0.4 mg capsule TAKE 1 CAPSULE BY MOUTH EVERY DAY AT BEDTIME 30 capsule 3   ??? tocilizumab (ACTEMRA) 162 mg/0.9 mL Syrg subcutaneous injection Inject the contents of one syringe (162 mg total) under the skin every fourteen (14) days. 6 mL 3     No facility-administered medications prior to visit.         Objective:     Total time spent on telephone visit: 24 min  Total time  spent on pre and post visit: 10 min        Allergies:     Patient has no known allergies.    Current Medications:     Current Outpatient Medications   Medication Sig Dispense Refill   ??? albuterol HFA 90 mcg/actuation inhaler Inhale 2 puffs every six (6) hours as needed for wheezing. 9 g 0   ??? amLODIPine (NORVASC) 10 MG tablet Take 1 tablet (10 mg total) by mouth daily. 90 tablet 3   ??? ascorbic acid (VITAMIN C) 500 MG tablet Take 500 mg by mouth daily.     ??? aspirin (ECOTRIN) 81 MG tablet Take 81 mg by mouth daily.     ??? atenoloL (TENORMIN) 50 MG tablet Take 1 tablet (50 mg total) by mouth Two (2) times a day. 180 tablet 3   ??? cyanocobalamin (VITAMIN B-12) 1000 MCG tablet Take 1,000 mcg by mouth daily.     ??? hydrALAZINE (APRESOLINE) 25 MG tablet Take 1 tablet (25 mg total) by mouth daily. 90 tablet 3   ??? losartan (COZAAR) 100 MG tablet Take 1 tablet (100 mg total) by mouth daily. 90 tablet 3   ??? multivitamin (TAB-A-VITE/THERAGRAN) per tablet Take 1 tablet by mouth daily.     ??? oxyCODONE-acetaminophen (PERCOCET) 5-325 mg per tablet Take 1 tablet by mouth every four (4) hours as needed for pain. 120 tablet 0   ??? oxyCODONE-acetaminophen (PERCOCET) 5-325 mg per tablet Take 1 tablet by mouth every four (4) hours as needed for pain. 16 tablet 0   ??? simvastatin (ZOCOR) 10 MG tablet Take 1 tablet (10 mg total) by mouth daily. 90 tablet 3   ??? tamsulosin (FLOMAX) 0.4 mg capsule TAKE 1 CAPSULE BY MOUTH EVERY DAY AT BEDTIME 30 capsule 3   ??? tocilizumab (ACTEMRA) 162 mg/0.9 mL Syrg subcutaneous injection Inject the contents of one syringe (162 mg total) under the skin every fourteen (14) days. 6 mL 3     No current facility-administered medications for this visit.           Note - This record has been created using [employer]. Chart creation errors have been sought, but may not always have been located. Such creation errors do not reflect on the standard of medical care.    Jenell Milliner, MD

## 2021-04-12 NOTE — Unmapped (Signed)
Pt called stating he would like to move his appt with PCP on 04/16/21 to a telephone encounter as he states he is physically unable to come into the office. Pt appt has been changed to telephone visit. Pt notified.

## 2021-04-16 ENCOUNTER — Encounter: Admit: 2021-04-16 | Discharge: 2021-04-17 | Payer: MEDICARE

## 2021-04-16 DIAGNOSIS — D7589 Other specified diseases of blood and blood-forming organs: Principal | ICD-10-CM

## 2021-04-16 DIAGNOSIS — M316 Other giant cell arteritis: Principal | ICD-10-CM

## 2021-04-16 DIAGNOSIS — G8929 Other chronic pain: Principal | ICD-10-CM

## 2021-04-16 DIAGNOSIS — E785 Hyperlipidemia, unspecified: Principal | ICD-10-CM

## 2021-04-16 DIAGNOSIS — N400 Enlarged prostate without lower urinary tract symptoms: Principal | ICD-10-CM

## 2021-04-16 DIAGNOSIS — J449 Chronic obstructive pulmonary disease, unspecified: Principal | ICD-10-CM

## 2021-04-16 DIAGNOSIS — Z79899 Other long term (current) drug therapy: Principal | ICD-10-CM

## 2021-04-16 DIAGNOSIS — I10 Essential (primary) hypertension: Principal | ICD-10-CM

## 2021-04-16 DIAGNOSIS — Z720 Tobacco use: Principal | ICD-10-CM

## 2021-04-16 DIAGNOSIS — G894 Chronic pain syndrome: Principal | ICD-10-CM

## 2021-04-16 DIAGNOSIS — M5136 Other intervertebral disc degeneration, lumbar region: Principal | ICD-10-CM

## 2021-04-16 DIAGNOSIS — I25810 Atherosclerosis of coronary artery bypass graft(s) without angina pectoris: Principal | ICD-10-CM

## 2021-04-16 DIAGNOSIS — M5440 Lumbago with sciatica, unspecified side: Principal | ICD-10-CM

## 2021-04-20 ENCOUNTER — Encounter: Admit: 2021-04-20 | Discharge: 2021-04-21 | Payer: MEDICARE

## 2021-04-20 DIAGNOSIS — D7589 Other specified diseases of blood and blood-forming organs: Principal | ICD-10-CM

## 2021-04-20 DIAGNOSIS — Z79899 Other long term (current) drug therapy: Principal | ICD-10-CM

## 2021-04-20 DIAGNOSIS — M5440 Lumbago with sciatica, unspecified side: Principal | ICD-10-CM

## 2021-04-20 DIAGNOSIS — G8929 Other chronic pain: Principal | ICD-10-CM

## 2021-04-20 DIAGNOSIS — I10 Essential (primary) hypertension: Principal | ICD-10-CM

## 2021-04-20 DIAGNOSIS — E785 Hyperlipidemia, unspecified: Principal | ICD-10-CM

## 2021-04-20 DIAGNOSIS — M5136 Other intervertebral disc degeneration, lumbar region: Principal | ICD-10-CM

## 2021-04-20 DIAGNOSIS — Z5181 Encounter for therapeutic drug level monitoring: Principal | ICD-10-CM

## 2021-04-20 LAB — CREATININE
CREATININE: 1.11 mg/dL — ABNORMAL HIGH
EGFR CKD-EPI AA MALE: 70 mL/min/{1.73_m2} (ref >=60)
EGFR CKD-EPI NON-AA MALE: 60 mL/min/{1.73_m2} (ref >=60)

## 2021-04-20 LAB — CBC W/ AUTO DIFF
BASOPHILS ABSOLUTE COUNT: 0 10*9/L (ref 0.0–0.1)
BASOPHILS RELATIVE PERCENT: 0.5 %
EOSINOPHILS ABSOLUTE COUNT: 0.4 10*9/L (ref 0.0–0.5)
EOSINOPHILS RELATIVE PERCENT: 6.5 %
HEMATOCRIT: 34.8 % — ABNORMAL LOW (ref 39.0–48.0)
HEMOGLOBIN: 12.4 g/dL — ABNORMAL LOW (ref 12.9–16.5)
LYMPHOCYTES ABSOLUTE COUNT: 1.9 10*9/L (ref 1.1–3.6)
LYMPHOCYTES RELATIVE PERCENT: 27.2 %
MEAN CORPUSCULAR HEMOGLOBIN CONC: 35.6 g/dL (ref 32.0–36.0)
MEAN CORPUSCULAR HEMOGLOBIN: 34.4 pg — ABNORMAL HIGH (ref 25.9–32.4)
MEAN CORPUSCULAR VOLUME: 96.7 fL — ABNORMAL HIGH (ref 77.6–95.7)
MEAN PLATELET VOLUME: 7.7 fL (ref 6.8–10.7)
MONOCYTES ABSOLUTE COUNT: 0.9 10*9/L — ABNORMAL HIGH (ref 0.3–0.8)
MONOCYTES RELATIVE PERCENT: 13.2 %
NEUTROPHILS ABSOLUTE COUNT: 3.6 10*9/L (ref 1.8–7.8)
NEUTROPHILS RELATIVE PERCENT: 52.6 %
PLATELET COUNT: 167 10*9/L (ref 150–450)
RED BLOOD CELL COUNT: 3.6 10*12/L — ABNORMAL LOW (ref 4.26–5.60)
RED CELL DISTRIBUTION WIDTH: 13.3 % (ref 12.2–15.2)
WBC ADJUSTED: 6.9 10*9/L (ref 3.6–11.2)

## 2021-04-20 LAB — COMPREHENSIVE METABOLIC PANEL
ALBUMIN: 4.3 g/dL (ref 3.4–5.0)
ALKALINE PHOSPHATASE: 71 U/L (ref 46–116)
ALT (SGPT): 13 U/L (ref 10–49)
ANION GAP: 6 mmol/L (ref 5–14)
AST (SGOT): 21 U/L (ref <=34)
BILIRUBIN TOTAL: 0.6 mg/dL (ref 0.3–1.2)
BLOOD UREA NITROGEN: 18 mg/dL (ref 9–23)
BUN / CREAT RATIO: 16
CALCIUM: 9.7 mg/dL (ref 8.7–10.4)
CHLORIDE: 99 mmol/L (ref 98–107)
CO2: 28 mmol/L (ref 20.0–31.0)
CREATININE: 1.12 mg/dL — ABNORMAL HIGH
EGFR CKD-EPI AA MALE: 69 mL/min/{1.73_m2} (ref >=60)
EGFR CKD-EPI NON-AA MALE: 60 mL/min/{1.73_m2} (ref >=60)
GLUCOSE RANDOM: 97 mg/dL (ref 70–99)
POTASSIUM: 4.2 mmol/L (ref 3.4–4.8)
PROTEIN TOTAL: 6.6 g/dL (ref 5.7–8.2)
SODIUM: 133 mmol/L — ABNORMAL LOW (ref 135–145)

## 2021-04-20 LAB — C-REACTIVE PROTEIN: C-REACTIVE PROTEIN: <4 mg/L (ref <=10.0)

## 2021-04-20 LAB — LIPID PANEL
CHOLESTEROL/HDL RATIO SCREEN: 2.6 (ref 1.0–4.5)
CHOLESTEROL: 115 mg/dL (ref <=200)
HDL CHOLESTEROL: 45 mg/dL (ref 40–60)
LDL CHOLESTEROL CALCULATED: 55 mg/dL (ref 40–99)
NON-HDL CHOLESTEROL: 70 mg/dL (ref 70–130)
TRIGLYCERIDES: 75 mg/dL (ref 0–150)
VLDL CHOLESTEROL CAL: 15 mg/dL (ref 12–42)

## 2021-04-20 LAB — SEDIMENTATION RATE: ERYTHROCYTE SEDIMENTATION RATE: <1 mm/h (ref 0–20)

## 2021-04-20 LAB — TOXICOLOGY SCREEN, URINE
AMPHETAMINE SCREEN URINE: NEGATIVE
BARBITURATE SCREEN URINE: NEGATIVE
BENZODIAZEPINE SCREEN, URINE: NEGATIVE
BUPRENORPHINE, URINE SCREEN: NEGATIVE
CANNABINOID SCREEN URINE: NEGATIVE
COCAINE(METAB.)SCREEN, URINE: NEGATIVE
FENTANYL SCREEN, URINE: NEGATIVE
METHADONE SCREEN, URINE: NEGATIVE
OPIATE SCREEN URINE: NEGATIVE
OXYCODONE SCREEN URINE: POSITIVE — AB

## 2021-04-20 LAB — AST: AST (SGOT): 21 U/L (ref <=34)

## 2021-04-20 LAB — ALT: ALT (SGPT): 12 U/L (ref 10–49)

## 2021-04-22 NOTE — Unmapped (Signed)
UDS results consistent with patient's opioid use.

## 2021-04-30 DIAGNOSIS — M5440 Lumbago with sciatica, unspecified side: Principal | ICD-10-CM

## 2021-04-30 DIAGNOSIS — G8929 Other chronic pain: Principal | ICD-10-CM

## 2021-04-30 DIAGNOSIS — M5136 Other intervertebral disc degeneration, lumbar region: Principal | ICD-10-CM

## 2021-04-30 MED ORDER — OXYCODONE-ACETAMINOPHEN 5 MG-325 MG TABLET
ORAL_TABLET | ORAL | 0 refills | 29.00000 days | Status: CP | PRN
Start: 2021-04-30 — End: 2021-05-29

## 2021-04-30 NOTE — Unmapped (Signed)
Patient is requesting the following refill  Requested Prescriptions     Pending Prescriptions Disp Refills   ??? oxyCODONE-acetaminophen (PERCOCET) 5-325 mg per tablet 30 tablet 0     Sig: Take 1 tablet by mouth every four (4) hours as needed for pain.       Recent Visits  Date Type Provider Dept   05/23/20 Office Visit Jenell Milliner, MD [place] [facility]   Showing recent visits within past 365 days with a meds authorizing provider and meeting all other requirements  Future Appointments  Date Type Provider Dept   08/13/21 Appointment Jenell Milliner, MD [place] [facility]   Showing future appointments within next 365 days with a meds authorizing provider and meeting all other requirements       Labs: Not applicable this refill    REFILL 04/01/21

## 2021-04-30 NOTE — Unmapped (Signed)
Patient is requesting the following refill  Requested Prescriptions     Pending Prescriptions Disp Refills   ??? oxyCODONE-acetaminophen (PERCOCET) 5-325 mg per tablet 120 tablet 0     Sig: Take 1 tablet by mouth every four (4) hours as needed for pain.       Recent Visits  Date Type Provider Dept   05/23/20 Office Visit Jenell Milliner, MD [place] [facility]   Showing recent visits within past 365 days with a meds authorizing provider and meeting all other requirements  Future Appointments  Date Type Provider Dept   08/13/21 Appointment Jenell Milliner, MD [place] [facility]   Showing future appointments within next 365 days with a meds authorizing provider and meeting all other requirements       Labs: Not applicable this refill

## 2021-04-30 NOTE — Unmapped (Signed)
Pt called nurse advise line multiple times leaving angry message for call back. Pt states new percocet prescription only filled for 30 pills. This nurse explained to pt we have caught the error and resent new script with correct tablets to pharmacy.

## 2021-05-28 DIAGNOSIS — M5136 Other intervertebral disc degeneration, lumbar region: Principal | ICD-10-CM

## 2021-05-28 DIAGNOSIS — G8929 Other chronic pain: Principal | ICD-10-CM

## 2021-05-28 DIAGNOSIS — M5440 Lumbago with sciatica, unspecified side: Principal | ICD-10-CM

## 2021-05-28 NOTE — Unmapped (Signed)
Patient is requesting the following refill  Requested Prescriptions     Pending Prescriptions Disp Refills   ??? oxyCODONE-acetaminophen (PERCOCET) 5-325 mg per tablet 120 tablet 0     Sig: Take 1 tablet by mouth every four (4) hours as needed for pain. Take 1 tablet by mouth every four (4) hours as needed for pain.       Last refill given on: 04/30/21 with 120 count and 0 refills.     Recent Visits  No visits were found meeting these conditions.  Showing recent visits within past 365 days with a meds authorizing provider and meeting all other requirements  Future Appointments  Date Type Provider Dept   08/13/21 Appointment Jenell Milliner, MD [place] [facility]   Showing future appointments within next 365 days with a meds authorizing provider and meeting all other requirements       Opioid Monitoring   Urine Tox ScreenLast Drug Screen Date: 04/20/2021 appropriate  Opiate Confirmation Test Last Drug Screen Date: 10/21/2019  Last Opioid Dispensed Provider: Jenell Milliner, MD  Prescribed MEDD : [phone]  Last PDMP Review: 04/03/2018  9:58 AM  Last Pain Agreement Signed Date: Not Found 01/01/2019         Naloxone Ordered: Not Found  Last OV: Visit date not found

## 2021-05-28 NOTE — Unmapped (Signed)
Patient is requesting the following refill  Requested Prescriptions     Pending Prescriptions Disp Refills   ??? oxyCODONE-acetaminophen (PERCOCET) 5-325 mg per tablet 120 tablet 0     Sig: Take 1 tablet by mouth every four (4) hours as needed for pain.       Recent Visits  No visits were found meeting these conditions.  Showing recent visits within past 365 days with a meds authorizing provider and meeting all other requirements  Future Appointments  Date Type Provider Dept   08/13/21 Appointment Jenell Milliner, MD  [facility]   Showing future appointments within next 365 days with a meds authorizing provider and meeting all other requirements       Labs: Not applicable this refill    last filled 04/30/21

## 2021-05-30 MED ORDER — OXYCODONE-ACETAMINOPHEN 5 MG-325 MG TABLET
ORAL_TABLET | ORAL | 0 refills | 20 days | Status: CP | PRN
Start: 2021-05-30 — End: 2021-06-29

## 2021-05-31 DIAGNOSIS — G8929 Other chronic pain: Principal | ICD-10-CM

## 2021-05-31 DIAGNOSIS — M5440 Lumbago with sciatica, unspecified side: Principal | ICD-10-CM

## 2021-05-31 DIAGNOSIS — M5136 Other intervertebral disc degeneration, lumbar region: Principal | ICD-10-CM

## 2021-05-31 MED ORDER — OXYCODONE-ACETAMINOPHEN 5 MG-325 MG TABLET
ORAL_TABLET | ORAL | 0 refills | 20 days | Status: CP | PRN
Start: 2021-05-31 — End: 2021-06-30

## 2021-05-31 NOTE — Unmapped (Signed)
[facility] called stating pt's percocet is on backorder and do not know when they can fill pt's prescription. Pharmacy states they have called [facility] in [place] and they do have quantity on hand. Pharmacist request [facility]. New medication has been setup

## 2021-05-31 NOTE — Unmapped (Signed)
Abstraction Result Flowsheet Data    This patient's last AWV date: [facility] Last Medicare Wellness Visit Date: 05/23/2020  This patients last WCC/CPE date: : Not Found      Reason for Encounter  Reason for Encounter: Outreach  Primary Reason for Outreach: AWV  Outreach Call Outcome: No voicemail available (Not set up yet)  Text Message: No

## 2021-06-22 NOTE — Unmapped (Signed)
Abstraction Result Flowsheet Data    This patient's last AWV date: [facility] Last Medicare Wellness Visit Date: 05/23/2020  This patients last WCC/CPE date: : Not Found      Reason for Encounter  Reason for Encounter: Outreach  Primary Reason for Outreach: AWV  Outreach Call Outcome: Left message  Text Message: No

## 2021-06-27 NOTE — Unmapped (Signed)
Pt called nurse advise line 06/26/21 a well as 06/27/21 in regards to refill on percocet medication. Pt's prescription was not able to be fulfilled at preferred pharmacy last med refill. Per pt's pharmacy request  Previous script sent to back up  With cancellation on current script. Pt states going back to preferred pharmacy today as was told he had a script waiting, pt upset due to only being given 30 pills instead of usual 120. Upon review it has been determined pt's pharmacy did not cancel previous prescription as stated they would. Pt has been made aware that all though they have picked up prescription too soon as well as 30 pill supply which will last pt 7-8 days pt is to call back for remaining 90 pills. This nurse mad pt aware after 90 pills are complete, regular prescription will resume. Pt verbalized understanding

## 2021-07-04 DIAGNOSIS — G8929 Other chronic pain: Principal | ICD-10-CM

## 2021-07-04 DIAGNOSIS — M5440 Lumbago with sciatica, unspecified side: Principal | ICD-10-CM

## 2021-07-04 DIAGNOSIS — M5136 Other intervertebral disc degeneration, lumbar region: Principal | ICD-10-CM

## 2021-07-04 MED ORDER — OXYCODONE-ACETAMINOPHEN 5 MG-325 MG TABLET
ORAL_TABLET | ORAL | 0 refills | 15.00000 days | Status: CP | PRN
Start: 2021-07-04 — End: 2021-08-03

## 2021-07-04 NOTE — Unmapped (Signed)
Patient is requesting the following refill  Requested Prescriptions     Pending Prescriptions Disp Refills   ??? oxyCODONE-acetaminophen (PERCOCET) 5-325 mg per tablet 90 tablet 0     Sig: Take 1 tablet by mouth every four (4) hours as needed for pain. Take 1 tablet by mouth every four (4) hours as needed for pain.       Recent Visits  No visits were found meeting these conditions.  Showing recent visits within past 365 days with a meds authorizing provider and meeting all other requirements  Future Appointments  Date Type Provider Dept   08/13/21 Appointment Jenell Milliner, MD [place] [facility]   Showing future appointments within next 365 days with a meds authorizing provider and meeting all other requirements       Labs: Not applicable this refill    pt received 30 tablets from pharmacy  Before due to to pharmacy error. Pt is due for remaining 90. This has been discussed with pt prior to refill. Please see telephone encounter

## 2021-07-16 DIAGNOSIS — J449 Chronic obstructive pulmonary disease, unspecified: Principal | ICD-10-CM

## 2021-07-16 DIAGNOSIS — R39198 Other difficulties with micturition: Principal | ICD-10-CM

## 2021-07-16 MED ORDER — TAMSULOSIN 0.4 MG CAPSULE
ORAL_CAPSULE | 0 refills | 0 days | Status: CP
Start: 2021-07-16 — End: ?

## 2021-07-16 MED ORDER — ALBUTEROL SULFATE HFA 90 MCG/ACTUATION AEROSOL INHALER
0 refills | 0 days | Status: CP
Start: 2021-07-16 — End: ?

## 2021-07-16 NOTE — Unmapped (Signed)
Patient is requesting the following refill  Requested Prescriptions     Pending Prescriptions Disp Refills   ??? tamsulosin (FLOMAX) 0.4 mg capsule [Pharmacy Med Name: Tamsulosin HCl 0.4 MG Oral Capsule] 30 capsule 0     Sig: TAKE 1 CAPSULE BY MOUTH EVERY DAY AT BEDTIME   ??? albuterol HFA 90 mcg/actuation inhaler [Pharmacy Med Name: Albuterol Sulfate HFA 108 (90 Base) MCG/ACT Inhalation Aerosol Solution] 9 g 0     Sig: INHALE 2 PUFFS BY MOUTH EVERY 6 HOURS AS NEEDED FOR WHEEZING       Recent Visits  No visits were found meeting these conditions.  Showing recent visits within past 365 days with a meds authorizing provider and meeting all other requirements  Future Appointments  Date Type Provider Dept   08/13/21 Appointment Jenell Milliner, MD [place] [facility]   Showing future appointments within next 365 days with a meds authorizing provider and meeting all other requirements

## 2021-07-26 DIAGNOSIS — M5136 Other intervertebral disc degeneration, lumbar region: Principal | ICD-10-CM

## 2021-07-26 DIAGNOSIS — G8929 Other chronic pain: Principal | ICD-10-CM

## 2021-07-26 DIAGNOSIS — M5440 Lumbago with sciatica, unspecified side: Principal | ICD-10-CM

## 2021-07-26 NOTE — Unmapped (Signed)
Patient is requesting the following refill  Requested Prescriptions     Pending Prescriptions Disp Refills   ??? oxyCODONE-acetaminophen (PERCOCET) 5-325 mg per tablet 120 tablet 0     Sig: Take 1 tablet by mouth every four (4) hours as needed for pain. Take 1 tablet by mouth every four (4) hours as needed for pain.     Last refill was in two parts:  Last refill given on: 7/6 for 30 and 7/13 for 90 and 0 refills.     Recent Visits  No visits were found meeting these conditions.  Showing recent visits within past 365 days with a meds authorizing provider and meeting all other requirements  Future Appointments  Date Type Provider Dept   08/13/21 Appointment Jenell Milliner, MD [place] [facility]   Showing future appointments within next 365 days with a meds authorizing provider and meeting all other requirements       Opioid Monitoring   Urine Tox Screen Last Drug Screen Date: 04/20/2021  Opiate Confirmation Test Last Drug Screen Date: 10/21/2019  Last Opioid Dispensed Provider: Jenell Milliner, MD  Prescribed MEDD : 45  Last PDMP Review: 04/03/2018  9:58 AM  Last Opioid Pain Agreement Signed Date: Not Found  Last Non Opioid Controlled Substance Pain Agreement Signed Date: Not Found    Naloxone Ordered: Not Found  Last OV: Visit date not found

## 2021-07-28 MED ORDER — OXYCODONE-ACETAMINOPHEN 5 MG-325 MG TABLET
ORAL_TABLET | ORAL | 0 refills | 20 days | Status: CP | PRN
Start: 2021-07-28 — End: 2021-08-27

## 2021-08-01 NOTE — Unmapped (Signed)
Abstraction Result Flowsheet Data    This patient's last AWV date: [facility] Last Medicare Wellness Visit Date: 05/23/2020  This patients last WCC/CPE date: : Not Found      Reason for Encounter  Reason for Encounter: Outreach  Primary Reason for Outreach: AWV  Outreach Call Outcome: WTM/Shared Visit cannot be made due to CM/PCP scheduling conflict  Text Message: No                        Dear Erik [name],    [place] [facility] is committed to helping you stay healthy. Our records show you are due for a Medicare Annual Wellness Visit.    A benefit for anyone with Medicare, this visit is designed to help prevent illness based on your current health and risk factors--at no cost to you.     An Annual Wellness Visit may include education or counseling about immunizations, and important health measurements such as blood pressure checks, screenings, and referrals for other care if needed.    Please reply to this message with the word ???SCHEDULE,??? and we will contact you to schedule your appointment.    Thank you for the opportunity to care for you.    Jenell Milliner, MD  [facility]

## 2021-08-02 NOTE — Unmapped (Signed)
The patient reports they are currently: at home. I spent 25 minutes on the phone with the patient on the date of service. I spent an additional 10 minutes on pre- and post-visit activities on the date of service.     The patient was physically located in [place] or a state in which I am permitted to provide care. The patient and/or parent/guardian understood that s/he may incur co-pays and cost sharing, and agreed to the telemedicine visit. The visit was reasonable and appropriate under the circumstances given the patient's presentation at the time.    The patient and/or parent/guardian has been advised of the potential risks and limitations of this mode of treatment (including, but not limited to, the absence of in-person examination) and has agreed to be treated using telemedicine. The patient's/patient's family's questions regarding telemedicine have been answered.     If the visit was completed in an ambulatory setting, the patient and/or parent/guardian has also been advised to contact their provider???s office for worsening conditions, and seek emergency medical treatment and/or call 911 if the patient deems either necessary.        Erik [name] is a 87 y.o. male  is here for   Chief Complaint   Patient presents with   ??? Follow-up     Telephone encounter         Assessment/Plan:    Erik [name] was seen today for follow-up.    Diagnoses and all orders for this visit:    DDD (degenerative disc disease), lumbar    Slow urinary stream  -     tamsulosin (FLOMAX) 0.4 mg capsule; Take 1 capsule (0.4 mg total) by mouth daily.    Coronary artery disease involving autologous vein coronary bypass graft without angina pectoris  -     simvastatin (ZOCOR) 10 MG tablet; Take 1 tablet (10 mg total) by mouth daily.    Essential hypertension, benign  -     losartan (COZAAR) 100 MG tablet; Take 1 tablet (100 mg total) by mouth daily.  -     hydrALAZINE (APRESOLINE) 25 MG tablet; Take 1 tablet (25 mg total) by mouth daily.  -     atenoloL (TENORMIN) 50 MG tablet; Take 1 tablet (50 mg total) by mouth Two (2) times a day.  -     amLODIPine (NORVASC) 10 MG tablet; Take 1 tablet (10 mg total) by mouth daily.    Benign prostatic hyperplasia, unspecified whether lower urinary tract symptoms present    Tobacco abuse    Macrocytosis without anemia    Dyslipidemia    Giant cell arteritis (CMS-HCC)    Chronic obstructive pulmonary disease, unspecified COPD type (CMS-HCC)      -BPH:  History of slow urinary flow- pt started on Flomax 0.4 mg at bedtime.  His symptoms remain stable.  He denies retention symptoms.     - chronic low back pain in the setting of lumbar DDD: ??He insisted on keeping his percocet Rx at 120 tabs a month. Review of PDMP reflects compliance. ??Annual UDS done in 03/2021, confirmed presence of opioid and he is was reminded on our last telephone visit that he needed an in person visit to renew his  annual controlled substance contract .  Review of pt's PDMP reflects med compliance.   Pt advised that he will need to return for nurse visit for renewal and completion of controlled substance contract. Form left at the front desk.     - COPD history: pt continue  to smoke occasionally. ??He has no chronic SOB symptoms. ????Most inhalers prescribed have been to costly for the pt. ??He continues on albuterol inhaler prn/rescue purposes. He is advised on not smoking cigarettes. He uses the rescue inhaler about twice a day. ????He wishes to continue with this regimen since other inhalers attempted in the past are very expensive. Inhaler education has been performed in the past.  He has recent sinus infection last week and he reports his symptoms are much improved this week.    - history of Giant Cell arteritis: he will continue follow up with his rheumatologist as scheduled along with medication regimen as directed.??He self administers every 2 weeks Actemra injections and he had a rheumatology follow up visit in 02/2021. He has a history of ischemic optic neuropathy and he was last seen by [facility] in 2019. Pt states he has his routine eye exam at [facility] optometry and is reminded to schedule an appt soon. Last rheumatology encounter from 02/2021, details are as follows:   Assessment/Recommendations:   87 y.o. smoker with h/o COPD, CAD s/p CABG,  HTN, active alcohol use, GIB 2/2 duodenal ulcer (likely 2/2 concurrent ibuprofen, alcohol and prednisone use) presenting for follow-up of  biopsy proven giant cell arteritis  complicated by ischemic optic neuropathy/ vision loss in left eye (dx 11/2017)  He was last seen 02/22/20 as telemedicine visit with Dr. Ilsa [name]  Currently taking tocilizumab 162 mg SQ q other week and tolerating without any side effects. Reports persistent vision loss of left eye. No new vision changes, headaches, temporal artery tenderness, scalp tenderness, jaw pain/claudication, shoulder/hip stiffness, fevers, night sweat, weight loss, excessive fatigue of concern.   - Continue tocilizumab 162 mg subcutaneous q2weeks. Refill sent in 02/23/21 by Dr. Ilsa [name]  - Update lab monitoring: CBCw/diff, Crt, AST, ALT, ESR, CRP. Pt agrees to complete at PCP. Orders placed.   -  Of note, patient with known h/o GIB/duodenal ulcer which does increase risk of GI perforation on tocilizumab - however, he is now >18 months out from this without any complications and wishes to continue treatment with tocilizumab  - Encouraged pt to receive third COVID-19 vaccine locally ASAP. Discussed option of Evusheld 2 weeks post vaccine. Will mail Evusheld pt fact sheet to pt to review. Asked pt to notify clinic if he'd like to receive 2 weeks post vaccine.   F/U in 6-8 months with Dr. Ilsa [name]         - HTN/CAD/Dyslipidemia history: pt remains normotensive and chest pain free on current medication regimen. ??He had ??normal electrolytes, creatinine and LFT's in 08/2019.????Lipid panel done in 03/2021 revealed HDL/LDL of 45/55. His home BP readings are always normal.  CMP result was normal in 03/2021 revealed creatinine level of 1.12 with normal LFT's and potassium level. His home BP readings remain normal.        - macrocytosis noted on CBC in 08/2019: this had been noted in the past. Pt states he was a heavy drinker in the past but more recently reduced his daily alcohol consumption to 3 beers a day.  Repeat CBC done in 03/2021 revealed H/H of 12/34 with mildly elevated MCV at 96.7. he has had normal  normal ferritin, iron, folate and vitamin B12 levels in the past.      His last 3 visits have been telephone visit.  His next visit will need to be an in person visit AWV and pt is aware of this.    Return in about 4 months (around  12/13/2021), or 40 min AWV and a nuse visit in the next week for control substance contract renewal.  Form is at the front desk.     Subjective:     HPI  Reason for telephone encounter: routine follow up visit and medication refill request.   We had a telemedicine encounter in 03/2021 for the following medical conditions:    -BPH:  History of slow urinary flow- pt started on Flomax 0.4 mg at bedtime.  His symptoms remain stable.     - chronic low back pain in the setting of lumbar DDD: ??He insisted on keeping his percocet Rx at 120 tabs a month. Review of PDMP reflects compliance. ??Annual UDS done in 03/2021, confirmed presence of opioid and he is was reminded on our last telephone visit that he needed an in person visit to renew his  annual controlled substance contract .  Review of pt's PDMP reflects med compliance.   Pt advised that he will need to return for nurse visit for renewal and completion of controlled substance contract. Form left at the front desk.     - COPD history: pt continue to smoke occasionally. ??He has no chronic SOB symptoms. ????Most inhalers prescribed have been to costly for the pt. ??He continues on albuterol inhaler prn/rescue purposes. He is advised on not smoking cigarettes. He uses the rescue inhaler about twice a day. ????He wishes to continue with this regimen since other inhalers attempted in the past are very expensive. Inhaler education has been performed in the past.  He has recent sinus infection last week and he reports his symptoms are much improved this week.    - history of Giant Cell arteritis: he will continue follow up with his rheumatologist as scheduled along with medication regimen as directed.??He self administers every 2 weeks Actemra injections and he had a rheumatology follow up visit in 02/2021. He has a history of ischemic optic neuropathy and he was last seen by [facility] in 2019. Pt states he has his routine eye exam at [facility] optometry and is reminded to schedule an appt soon. Last rheumatology encounter from 02/2021, details are as follows:   Assessment/Recommendations:   87 y.o. smoker with h/o COPD, CAD s/p CABG,  HTN, active alcohol use, GIB 2/2 duodenal ulcer (likely 2/2 concurrent ibuprofen, alcohol and prednisone use) presenting for follow-up of  biopsy proven giant cell arteritis  complicated by ischemic optic neuropathy/ vision loss in left eye (dx 11/2017)  He was last seen 02/22/20 as telemedicine visit with Dr. Ilsa [name]  Currently taking tocilizumab 162 mg SQ q other week and tolerating without any side effects. Reports persistent vision loss of left eye. No new vision changes, headaches, temporal artery tenderness, scalp tenderness, jaw pain/claudication, shoulder/hip stiffness, fevers, night sweat, weight loss, excessive fatigue of concern.   - Continue tocilizumab 162 mg subcutaneous q2weeks. Refill sent in 02/23/21 by Dr. Ilsa [name]  - Update lab monitoring: CBCw/diff, Crt, AST, ALT, ESR, CRP. Pt agrees to complete at PCP. Orders placed.   -  Of note, patient with known h/o GIB/duodenal ulcer which does increase risk of GI perforation on tocilizumab - however, he is now >18 months out from this without any complications and wishes to continue treatment with tocilizumab  - Encouraged pt to receive third COVID-19 vaccine locally ASAP. Discussed option of Evusheld 2 weeks post vaccine. Will mail Evusheld pt fact sheet to pt to review. Asked pt to notify clinic if he'd like to receive 2 weeks post vaccine.  F/U in 6-8 months with Dr. Ilsa [name]         - HTN/CAD/Dyslipidemia history: pt remains normotensive and chest pain free on current medication regimen. ??He had ??normal electrolytes, creatinine and LFT's in 08/2019.????Lipid panel done in 03/2021 revealed HDL/LDL of 45/55. His home BP readings are always normal.  CMP result was normal in 03/2021 revealed creatinine level of 1.12 with normal LFT's and potassium level. His home BP readings remain normal.        - macrocytosis noted on CBC in 08/2019: this had been noted in the past. Pt states he was a heavy drinker in the past but more recently reduced his daily alcohol consumption to 3 beers a day.  Repeat CBC done in 03/2021 revealed H/H of 12/34 with mildly elevated MCV at 96.7. he has had normal  normal ferritin, iron, folate and vitamin B12 levels in the past.              ROS  Constitutional:  Denies  unexpected weight loss or gain, or weakness   Eyes:  Denies visual changes  Respiratory:  Denies cough or shortness of breath. No change in exercise  tolerance  Cardiovascular:  Denies chest pain, palpitations or lower extremity swelling   GI:  Denies abdominal pain, diarrhea, constipation   Musculoskeletal:  Denies myalgias  Skin:  Denies nonhealing lesions  Neurologic:  Denies headache, focal weakness or numbness, tingling  Endocrine:  Denies polyuria or polydypsia   Psychiatric:  Denies depression, anxiety      Outpatient Medications Prior to Visit   Medication Sig Dispense Refill   ??? albuterol HFA 90 mcg/actuation inhaler INHALE 2 PUFFS BY MOUTH EVERY 6 HOURS AS NEEDED FOR WHEEZING 9 g 0   ??? ascorbic acid (VITAMIN C) 500 MG tablet Take 500 mg by mouth daily.     ??? aspirin (ECOTRIN) 81 MG tablet Take 81 mg by mouth daily.     ??? cyanocobalamin, vitamin B-12, 1000 MCG tablet Take 1,000 mcg by mouth daily.     ??? multivitamin (TAB-A-VITE/THERAGRAN) per tablet Take 1 tablet by mouth daily.     ??? oxyCODONE-acetaminophen (PERCOCET) 5-325 mg per tablet Take 1 tablet by mouth every four (4) hours as needed for pain. Take 1 tablet by mouth every four (4) hours as needed for pain. 120 tablet 0   ??? tocilizumab (ACTEMRA) 162 mg/0.9 mL Syrg subcutaneous injection Inject the contents of one syringe (162 mg total) under the skin every fourteen (14) days. 6 mL 3   ??? amLODIPine (NORVASC) 10 MG tablet Take 1 tablet (10 mg total) by mouth daily. 90 tablet 3   ??? atenoloL (TENORMIN) 50 MG tablet Take 1 tablet (50 mg total) by mouth Two (2) times a day. 180 tablet 3   ??? hydrALAZINE (APRESOLINE) 25 MG tablet Take 1 tablet (25 mg total) by mouth daily. 90 tablet 3   ??? losartan (COZAAR) 100 MG tablet Take 1 tablet (100 mg total) by mouth daily. 90 tablet 3   ??? simvastatin (ZOCOR) 10 MG tablet Take 1 tablet (10 mg total) by mouth daily. 90 tablet 3   ??? tamsulosin (FLOMAX) 0.4 mg capsule TAKE 1 CAPSULE BY MOUTH EVERY DAY AT BEDTIME 30 capsule 0   ??? oxyCODONE-acetaminophen (PERCOCET) 5-325 mg per tablet Take 1 tablet by mouth every four (4) hours as needed for pain. 30 tablet 0     No facility-administered medications prior to visit.         Objective:  Time spent on telephone encounter: 25 min  Time spent pre and post visit encounter: 10 min      Allergies:     Patient has no known allergies.    Current Medications:     Current Outpatient Medications   Medication Sig Dispense Refill   ??? albuterol HFA 90 mcg/actuation inhaler INHALE 2 PUFFS BY MOUTH EVERY 6 HOURS AS NEEDED FOR WHEEZING 9 g 0   ??? ascorbic acid (VITAMIN C) 500 MG tablet Take 500 mg by mouth daily.     ??? aspirin (ECOTRIN) 81 MG tablet Take 81 mg by mouth daily.     ??? cyanocobalamin, vitamin B-12, 1000 MCG tablet Take 1,000 mcg by mouth daily.     ??? multivitamin (TAB-A-VITE/THERAGRAN) per tablet Take 1 tablet by mouth daily. ??? oxyCODONE-acetaminophen (PERCOCET) 5-325 mg per tablet Take 1 tablet by mouth every four (4) hours as needed for pain. Take 1 tablet by mouth every four (4) hours as needed for pain. 120 tablet 0   ??? tocilizumab (ACTEMRA) 162 mg/0.9 mL Syrg subcutaneous injection Inject the contents of one syringe (162 mg total) under the skin every fourteen (14) days. 6 mL 3   ??? amLODIPine (NORVASC) 10 MG tablet Take 1 tablet (10 mg total) by mouth daily. 90 tablet 3   ??? atenoloL (TENORMIN) 50 MG tablet Take 1 tablet (50 mg total) by mouth Two (2) times a day. 180 tablet 3   ??? hydrALAZINE (APRESOLINE) 25 MG tablet Take 1 tablet (25 mg total) by mouth daily. 90 tablet 3   ??? losartan (COZAAR) 100 MG tablet Take 1 tablet (100 mg total) by mouth daily. 90 tablet 3   ??? oxyCODONE-acetaminophen (PERCOCET) 5-325 mg per tablet Take 1 tablet by mouth every four (4) hours as needed for pain. 30 tablet 0   ??? simvastatin (ZOCOR) 10 MG tablet Take 1 tablet (10 mg total) by mouth daily. 90 tablet 3   ??? tamsulosin (FLOMAX) 0.4 mg capsule Take 1 capsule (0.4 mg total) by mouth daily. 90 capsule 3     No current facility-administered medications for this visit.           Note - This record has been created using [employer]. Chart creation errors have been sought, but may not always have been located. Such creation errors do not reflect on the standard of medical care.    Jenell Milliner, MD

## 2021-08-09 NOTE — Unmapped (Signed)
Wants to change appt 08/13/2021 to a phone visit.  BP 136/56 recheck after meds 127/55.  Transferred to [place] to make into phone visit.

## 2021-08-13 ENCOUNTER — Institutional Professional Consult (permissible substitution): Admit: 2021-08-13 | Discharge: 2021-08-14 | Payer: MEDICARE

## 2021-08-13 DIAGNOSIS — N400 Enlarged prostate without lower urinary tract symptoms: Principal | ICD-10-CM

## 2021-08-13 DIAGNOSIS — M5136 Other intervertebral disc degeneration, lumbar region: Principal | ICD-10-CM

## 2021-08-13 DIAGNOSIS — R39198 Other difficulties with micturition: Principal | ICD-10-CM

## 2021-08-13 DIAGNOSIS — E785 Hyperlipidemia, unspecified: Principal | ICD-10-CM

## 2021-08-13 DIAGNOSIS — Z720 Tobacco use: Principal | ICD-10-CM

## 2021-08-13 DIAGNOSIS — I25810 Atherosclerosis of coronary artery bypass graft(s) without angina pectoris: Principal | ICD-10-CM

## 2021-08-13 DIAGNOSIS — J449 Chronic obstructive pulmonary disease, unspecified: Principal | ICD-10-CM

## 2021-08-13 DIAGNOSIS — M316 Other giant cell arteritis: Principal | ICD-10-CM

## 2021-08-13 DIAGNOSIS — I10 Essential (primary) hypertension: Principal | ICD-10-CM

## 2021-08-13 DIAGNOSIS — D7589 Other specified diseases of blood and blood-forming organs: Principal | ICD-10-CM

## 2021-08-13 MED ORDER — AMLODIPINE 10 MG TABLET
ORAL_TABLET | Freq: Every day | ORAL | 3 refills | 90 days | Status: CP
Start: 2021-08-13 — End: ?

## 2021-08-13 MED ORDER — HYDRALAZINE 25 MG TABLET
ORAL_TABLET | Freq: Every day | ORAL | 3 refills | 90 days | Status: CP
Start: 2021-08-13 — End: 2022-08-13

## 2021-08-13 MED ORDER — SIMVASTATIN 10 MG TABLET
ORAL_TABLET | Freq: Every day | ORAL | 3 refills | 90.00000 days | Status: CP
Start: 2021-08-13 — End: 2022-08-14

## 2021-08-13 MED ORDER — ATENOLOL 50 MG TABLET
ORAL_TABLET | Freq: Two times a day (BID) | ORAL | 3 refills | 90 days | Status: CP
Start: 2021-08-13 — End: 2022-08-13

## 2021-08-13 MED ORDER — TAMSULOSIN 0.4 MG CAPSULE
ORAL_CAPSULE | Freq: Every day | ORAL | 3 refills | 90.00000 days | Status: CP
Start: 2021-08-13 — End: ?

## 2021-08-13 MED ORDER — LOSARTAN 100 MG TABLET
ORAL_TABLET | Freq: Every day | ORAL | 3 refills | 90 days | Status: CP
Start: 2021-08-13 — End: 2022-08-13

## 2021-08-24 DIAGNOSIS — M5440 Lumbago with sciatica, unspecified side: Principal | ICD-10-CM

## 2021-08-24 DIAGNOSIS — M5136 Other intervertebral disc degeneration, lumbar region: Principal | ICD-10-CM

## 2021-08-24 DIAGNOSIS — G8929 Other chronic pain: Principal | ICD-10-CM

## 2021-08-24 NOTE — Unmapped (Signed)
Patient called make sure medication will be able available to be picked up, as he will be out of medication as of Monday morning. Patient was very insistent that he be able to get his medication.

## 2021-08-24 NOTE — Unmapped (Signed)
Patient is requesting the following refill  Requested Prescriptions     Pending Prescriptions Disp Refills   ??? oxyCODONE-acetaminophen (PERCOCET) 5-325 mg per tablet 120 tablet 0     Sig: Take 1 tablet by mouth every four (4) hours as needed for pain. Take 1 tablet by mouth every four (4) hours as needed for pain.       Last refill given on: 07/28/2021 with 120 count and 0 refills. Next refill is due 08/27/21, however that is a holiday and pharmacy's will be closed, so I added a note that he may pick up 1 day early due due to this.     Recent Visits  No visits were found meeting these conditions.  Showing recent visits within past 365 days with a meds authorizing provider and meeting all other requirements  Future Appointments  Date Type Provider Dept   12/11/21 Appointment Jenell Milliner, MD [place] [facility]   Showing future appointments within next 365 days with a meds authorizing provider and meeting all other requirements       Opioid Monitoring   Urine Tox Screen Last Drug Screen Date: 04/20/2021  Opiate Confirmation Test Last Drug Screen Date: 10/21/2019  Last Opioid Dispensed Provider: Jenell Milliner, MD  Prescribed MEDD : 45  Last PDMP Review: 04/03/2018  9:58 AM  Last Opioid Contract signed: 08/21/21  Last Non Opioid Controlled Substance Pain Agreement Signed Date: Not Found    Naloxone Ordered: Not Found  Last OV: Visit date not found

## 2021-08-26 DIAGNOSIS — M5136 Other intervertebral disc degeneration, lumbar region: Principal | ICD-10-CM

## 2021-08-26 DIAGNOSIS — M5440 Lumbago with sciatica, unspecified side: Principal | ICD-10-CM

## 2021-08-26 DIAGNOSIS — G8929 Other chronic pain: Principal | ICD-10-CM

## 2021-08-26 NOTE — Unmapped (Signed)
I have attempted twice to send his percocet Rx electronically, but it will not go through and it prints instead.  The office is closed for the holiday weekend so he may be delayed in picking up his Rx.

## 2021-08-27 MED ORDER — OXYCODONE-ACETAMINOPHEN 5 MG-325 MG TABLET
ORAL_TABLET | ORAL | 0 refills | 20.00000 days | Status: CP | PRN
Start: 2021-08-27 — End: 2021-09-26

## 2021-08-27 NOTE — Unmapped (Addendum)
PCP was able to send new script in electronically. Tried to update pt x 2 but no VM noted.     [facility] [address] [place] - [address]  P: [phone]  F: [phone]    Address    [address]   [place] [address]   Store number: [phone]      Operation       Hours: Not open 24 hours   E-Prescribing   E-Prescribing controlled substances   Mode: Retail   Type: External            Recent:   What is the date of your last related visit?  Annual Wellness Visit 08/01/2021  Related acute medications Rx'd:  NA   Home treatment tried:  Took Percocet x 1 this AM       Relevant:   Allergies: Patient has no known allergies.  Medications: Percocet   Health History: Degenerative Disc Disease  Weight: NA       Reason for Disposition  ??? Caller requesting a CONTROLLED substance prescription refill (e.g., narcotics, ADHD medicines)    Answer Assessment - Initial Assessment Questions  1. DRUG NAME: What medicine do you need to have refilled?      Percocet   2. REFILLS REMAINING: How many refills are remaining? (Note: The label on the medicine or pill bottle will show how many refills are remaining. If there are no refills remaining, then a renewal may be needed.)      No refills remaining. Cannot get new RX until after 9/5  3. EXPIRATION DATE: What is the expiration date? (Note: The label states when the prescription will expire, and thus can no longer be refilled.)     Received RX for one month supply on 8/6 but 31 days in month so he will be short today   4. PRESCRIBING HCP: Who prescribed it? Reason: If prescribed by specialist, call should be referred to that group.      Dr. Vinson Moselle   5. SYMPTOMS: Do you have any symptoms?      States he has arthritic pain for years and declined triage advice   6. PREGNANCY: Is there any chance that you are pregnant? When was your last menstrual period?      NA    Protocols used: MEDICATION REFILL AND RENEWAL CALL-A-AH

## 2021-09-24 NOTE — Unmapped (Signed)
Patient is requesting the following refill  Requested Prescriptions     Pending Prescriptions Disp Refills   ??? oxyCODONE-acetaminophen (PERCOCET) 5-325 mg per tablet 120 tablet 0     Sig: Take 1 tablet by mouth every six (6) hours as needed for pain.       Last refill given on: 08/27/21 with 120 count and 0 refills.     Recent Visits  No visits were found meeting these conditions.  Showing recent visits within past 365 days with a meds authorizing provider and meeting all other requirements  Future Appointments  Date Type Provider Dept   12/11/21 Appointment Jenell Milliner, MD [place] [facility]   Showing future appointments within next 365 days with a meds authorizing provider and meeting all other requirements       Opioid Monitoring   Urine Tox Screen Last Drug Screen Date: 04/20/2021  Opiate Confirmation Test Last Drug Screen Date: 10/21/2019  Last Opioid Dispensed Provider: Jenell Milliner, MD  Prescribed MEDD : 30  Last PDMP Review: 09/24/2021 11:25 AM  Last Opioid Pain Agreement Signed Date: Not Found  Last Non Opioid Controlled Substance Pain Agreement Signed Date: Not Found    Naloxone Ordered: Not Found  Last OV: Visit date not found

## 2021-09-26 MED ORDER — OXYCODONE-ACETAMINOPHEN 5 MG-325 MG TABLET
ORAL_TABLET | Freq: Four times a day (QID) | ORAL | 0 refills | 30 days | Status: CP | PRN
Start: 2021-09-26 — End: ?

## 2021-10-04 NOTE — Unmapped (Signed)
Abstraction Result Flowsheet Data    This patient's last AWV date: [facility] Last Medicare Wellness Visit Date: 05/23/2020  This patients last WCC/CPE date: : Not Found      Reason for Encounter  Reason for Encounter: Outreach  Primary Reason for Outreach: AWV  Outreach Call Outcome: Unable to contact  Text Message: No                                        Dear Erik [name],    [facility] is committed to helping you stay healthy. Our records show you are due for a Medicare Annual Wellness Visit.    A benefit for anyone with Medicare, this visit is designed to help prevent illness based on your current health and risk factors--at no cost to you.     An Annual Wellness Visit may include education or counseling about immunizations, and important health measurements such as blood pressure checks, screenings, and referrals for other care if needed.    Please reply to this message with the word ???SCHEDULE,??? and we will contact you to schedule your appointment.    Thank you for the opportunity to care for you.    Jenell Milliner, MD  [facility]

## 2021-10-25 MED ORDER — OXYCODONE-ACETAMINOPHEN 5 MG-325 MG TABLET
ORAL_TABLET | Freq: Four times a day (QID) | ORAL | 0 refills | 30 days | Status: CP | PRN
Start: 2021-10-25 — End: ?

## 2021-10-25 NOTE — Unmapped (Signed)
Patient is requesting the following refill  Requested Prescriptions     Pending Prescriptions Disp Refills   ??? oxyCODONE-acetaminophen (PERCOCET) 5-325 mg per tablet 120 tablet 0     Sig: Take 1 tablet by mouth every six (6) hours as needed for pain.       Last refill given on: 09/26/21 with 120 count and 0 refills.     Recent Visits  No visits were found meeting these conditions.  Showing recent visits within past 365 days with a meds authorizing provider and meeting all other requirements  Future Appointments  Date Type Provider Dept   12/11/21 Appointment Jenell Milliner, MD [facility]   Showing future appointments within next 365 days with a meds authorizing provider and meeting all other requirements       Opioid Monitoring   Urine Tox Screen Last Drug Screen Date: 04/20/2021  Opiate Confirmation Test Last Drug Screen Date: 10/21/2019  Last Opioid Dispensed Provider: Jenell Milliner, MD  Prescribed MEDD : 30  Last PDMP Review: 09/24/2021 11:25 AM  Last Opioid agreement signed: 08/21/21  Last Non Opioid Controlled Substance     Naloxone Ordered: Not Found  Last OV: Visit date not found

## 2021-10-26 ENCOUNTER — Ambulatory Visit: Admit: 2021-10-26 | Discharge: 2021-10-27 | Payer: MEDICARE

## 2021-10-26 LAB — COMPREHENSIVE METABOLIC PANEL
ALBUMIN: 4.1 g/dL (ref 3.4–5.0)
ALKALINE PHOSPHATASE: 42 U/L — ABNORMAL LOW (ref 46–116)
ALT (SGPT): 16 U/L (ref 10–49)
ANION GAP: 8 mmol/L (ref 5–14)
AST (SGOT): 27 U/L (ref <=34)
BILIRUBIN TOTAL: 1 mg/dL (ref 0.3–1.2)
BLOOD UREA NITROGEN: 10 mg/dL (ref 9–23)
BUN / CREAT RATIO: 11
CALCIUM: 9.7 mg/dL (ref 8.7–10.4)
CHLORIDE: 101 mmol/L (ref 98–107)
CO2: 24.8 mmol/L (ref 20.0–31.0)
CREATININE: 0.93 mg/dL
EGFR CKD-EPI (2021) MALE: 80 mL/min/{1.73_m2} (ref >=60)
GLUCOSE RANDOM: 105 mg/dL (ref 70–179)
POTASSIUM: 4.3 mmol/L (ref 3.4–4.8)
PROTEIN TOTAL: 5.9 g/dL (ref 5.7–8.2)
SODIUM: 134 mmol/L — ABNORMAL LOW (ref 135–145)

## 2021-10-26 LAB — CBC W/ AUTO DIFF
BASOPHILS ABSOLUTE COUNT: 0 10*9/L (ref 0.0–0.1)
BASOPHILS RELATIVE PERCENT: 0.2 %
EOSINOPHILS ABSOLUTE COUNT: 0 10*9/L (ref 0.0–0.5)
EOSINOPHILS RELATIVE PERCENT: 0.4 %
HEMATOCRIT: 33.2 % — ABNORMAL LOW (ref 39.0–48.0)
HEMOGLOBIN: 11.6 g/dL — ABNORMAL LOW (ref 12.9–16.5)
LYMPHOCYTES ABSOLUTE COUNT: 0.4 10*9/L — ABNORMAL LOW (ref 1.1–3.6)
LYMPHOCYTES RELATIVE PERCENT: 4.1 %
MEAN CORPUSCULAR HEMOGLOBIN CONC: 34.9 g/dL (ref 32.0–36.0)
MEAN CORPUSCULAR HEMOGLOBIN: 34.5 pg — ABNORMAL HIGH (ref 25.9–32.4)
MEAN CORPUSCULAR VOLUME: 99 fL — ABNORMAL HIGH (ref 77.6–95.7)
MEAN PLATELET VOLUME: 7.6 fL (ref 6.8–10.7)
MONOCYTES ABSOLUTE COUNT: 0.6 10*9/L (ref 0.3–0.8)
MONOCYTES RELATIVE PERCENT: 5.9 %
NEUTROPHILS ABSOLUTE COUNT: 8.9 10*9/L — ABNORMAL HIGH (ref 1.8–7.8)
NEUTROPHILS RELATIVE PERCENT: 89.4 %
NUCLEATED RED BLOOD CELLS: 0 /100{WBCs} (ref <=4)
PLATELET COUNT: 150 10*9/L (ref 150–450)
RED BLOOD CELL COUNT: 3.35 10*12/L — ABNORMAL LOW (ref 4.26–5.60)
RED CELL DISTRIBUTION WIDTH: 13.2 % (ref 12.2–15.2)
WBC ADJUSTED: 10 10*9/L (ref 3.6–11.2)

## 2021-10-26 LAB — HIGH SENSITIVITY TROPONIN I - SINGLE
HIGH SENSITIVITY TROPONIN I: 21 ng/L (ref <=53)
HIGH SENSITIVITY TROPONIN I: 20 ng/L (ref <=53)
HIGH SENSITIVITY TROPONIN I: 20 ng/L (ref <=53)

## 2021-10-26 MED ORDER — SPIRIVA RESPIMAT 2.5 MCG/ACTUATION SOLUTION FOR INHALATION
Freq: Every day | RESPIRATORY_TRACT | 0 refills | 0.00000 days | Status: CP
Start: 2021-10-26 — End: 2021-10-26

## 2021-10-26 MED ADMIN — calcium carbonate (TUMS) chewable tablet 200 mg of elem calcium: 200 mg | ORAL | @ 20:00:00

## 2021-10-26 MED ADMIN — polyethylene glycol (MIRALAX) packet 17 g: 17 g | ORAL | @ 16:00:00

## 2021-10-26 MED ADMIN — losartan (COZAAR) tablet 100 mg: 100 mg | ORAL | @ 13:00:00

## 2021-10-26 MED ADMIN — methylPREDNISolone sodium succinate (PF) (Solu-MEDROL) injection 125 mg: 125 mg | INTRAVENOUS | @ 06:00:00 | Stop: 2021-10-26

## 2021-10-26 MED ADMIN — azithromycin (ZITHROMAX) tablet 500 mg: 500 mg | ORAL | @ 09:00:00 | Stop: 2021-10-26

## 2021-10-26 MED ADMIN — albuterol 2.5 mg /3 mL (0.083 %) nebulizer solution 5 mg: 5 mg | RESPIRATORY_TRACT | @ 07:00:00 | Stop: 2021-10-26

## 2021-10-26 MED ADMIN — tamsulosin (FLOMAX) 24 hr capsule 0.4 mg: .4 mg | ORAL | @ 13:00:00

## 2021-10-26 MED ADMIN — ipratropium-albuteroL (DUO-NEB) 0.5-2.5 mg/3 mL nebulizer solution 3 mL: 3 mL | RESPIRATORY_TRACT | @ 12:00:00

## 2021-10-26 MED ADMIN — ipratropium-albuteroL (DUO-NEB) 0.5-2.5 mg/3 mL nebulizer solution 3 mL: 3 mL | RESPIRATORY_TRACT | @ 16:00:00

## 2021-10-26 MED ADMIN — amLODIPine (NORVASC) tablet 10 mg: 10 mg | ORAL | @ 13:00:00

## 2021-10-26 MED ADMIN — ipratropium (ATROVENT) 0.02 % nebulizer solution 500 mcg: 500 ug | RESPIRATORY_TRACT | @ 06:00:00 | Stop: 2021-10-26

## 2021-10-26 MED ADMIN — enoxaparin (LOVENOX) syringe 40 mg: 40 mg | SUBCUTANEOUS | @ 22:00:00

## 2021-10-26 MED ADMIN — lactated ringers bolus 1,000 mL: 1000 mL | INTRAVENOUS | @ 09:00:00 | Stop: 2021-10-26

## 2021-10-26 MED ADMIN — cefTRIAXone (ROCEPHIN) 1 g in sodium chloride 0.9 % (NS) 100 mL IVPB-connector bag: 1 g | INTRAVENOUS | @ 09:00:00 | Stop: 2021-10-26

## 2021-10-26 MED ADMIN — ketorolac (TORADOL) injection 15 mg: 15 mg | INTRAVENOUS | @ 08:00:00 | Stop: 2021-10-26

## 2021-10-26 MED ADMIN — albuterol 2.5 mg /3 mL (0.083 %) nebulizer solution 5 mg: 5 mg | RESPIRATORY_TRACT | @ 08:00:00 | Stop: 2021-10-26

## 2021-10-26 MED ADMIN — oxyCODONE-acetaminophen (PERCOCET) 5-325 mg tablet 1 tablet: 1 | ORAL | @ 13:00:00 | Stop: 2021-11-09

## 2021-10-26 MED ADMIN — aspirin chewable tablet 81 mg: 81 mg | ORAL | @ 13:00:00

## 2021-10-26 MED ADMIN — oxyCODONE-acetaminophen (PERCOCET) 5-325 mg tablet 1 tablet: 1 | ORAL | @ 20:00:00 | Stop: 2021-11-09

## 2021-10-26 MED ADMIN — ipratropium (ATROVENT) 0.02 % nebulizer solution 500 mcg: 500 ug | RESPIRATORY_TRACT | @ 08:00:00 | Stop: 2021-10-26

## 2021-10-26 MED ADMIN — albuterol 2.5 mg /3 mL (0.083 %) nebulizer solution 5 mg: 5 mg | RESPIRATORY_TRACT | @ 06:00:00 | Stop: 2021-10-26

## 2021-10-26 MED ADMIN — atenoloL (TENORMIN) tablet 50 mg: 50 mg | ORAL | @ 13:00:00

## 2021-10-26 MED ADMIN — MORPhine injection 2 mg: 2 mg | INTRAVENOUS | @ 09:00:00 | Stop: 2021-10-26

## 2021-10-26 MED ADMIN — ipratropium (ATROVENT) 0.02 % nebulizer solution 500 mcg: 500 ug | RESPIRATORY_TRACT | @ 07:00:00 | Stop: 2021-10-26

## 2021-10-26 MED ADMIN — cyanocobalamin (vitamin B-12) tablet 1,000 mcg: 1000 ug | ORAL | @ 13:00:00

## 2021-10-26 MED ADMIN — ipratropium-albuteroL (DUO-NEB) 0.5-2.5 mg/3 mL nebulizer solution 3 mL: 3 mL | RESPIRATORY_TRACT | @ 20:00:00

## 2021-10-26 MED ADMIN — hydrALAZINE (APRESOLINE) tablet 25 mg: 25 mg | ORAL | @ 13:00:00

## 2021-10-26 NOTE — Unmapped (Signed)
Patient positive for rhinovirus and on droplet/contact precautions. His cough is more productive today. Patient c/o shoulder pain this morning, doctor gave PRN percocet (same as home med). Took all meds without any adverse reactions. VSS. Will continue to monitor.     Problem: Adult Inpatient Plan of Care  Goal: Plan of Care Review  Outcome: Progressing  Goal: Patient-Specific Goal (Individualized)  Outcome: Progressing  Goal: Absence of Hospital-Acquired Illness or Injury  Outcome: Progressing  Goal: Optimal Comfort and Wellbeing  Outcome: Progressing  Goal: Readiness for Transition of Care  Outcome: Progressing  Goal: Rounds/Family Conference  Outcome: Progressing     Problem: Self-Care Deficit  Goal: Improved Ability to Complete Activities of Daily Living  Outcome: Progressing     Problem: Fall Injury Risk  Goal: Absence of Fall and Fall-Related Injury  Outcome: Progressing     Problem: Infection  Goal: Absence of Infection Signs and Symptoms  Outcome: Progressing     Problem: Impaired Wound Healing  Goal: Optimal Wound Healing  Outcome: Progressing

## 2021-10-26 NOTE — Unmapped (Signed)
PHYSICAL THERAPY  Evaluation (10/26/21 0905)          Patient Name:?? Erik [name]????????   Medical Record Number: [id number]   Date of Birth: [birth year]  Sex: Male??  ??    Treatment Diagnosis: order is for eval and treat     Activity Tolerance: Tolerated treatment well     ASSESSMENT  Problem List: Decreased endurance, Postural Weakness      Assessment : Erik [name] is a 87 y.o. male with PMHx COPD hypertension, CAD status post CABG,, BPH, tobacco use, hyperlipidemia, giant cell arteritis on Actemra, as reviewed in the EMR who presented to [facility] with COPD exacerbation (CMS-HCC). pt presents to PT with decreased endurance from baseline who will benefit from PT intervention to address. Based on the AM-PAC 5 item raw score of 18/20, the patient is considered to be 29.65% impaired with basic mobility. This indicates pt will benefit from 3x/week at discharge to progress. After review of contributing co-morbitities and personal factors, clinical presentation and exam findings, patient demonstrates low complexity for evaluation and development of POC.         Today's Interventions: pt ed re PT POC, mobility, breathing ex, ambulation, all questions answered for safe transition homem eval AM-PAC 18/20                            PLAN  Planned Frequency of Treatment:?? 1-2x per day for: 3-4x week       Planned Interventions: Diaphragmatic / Pursed-lip breathing, Endurance activities, Education - Patient, Education - Family / caregiver, Functional mobility, Therapeutic exercise, Therapeutic activity     Post-Discharge Physical Therapy Recommendations:?? 3x weekly     PT DME Recommendations: None??????????       Goals:   Patient and Family Goals: get better, go home     Long Term Goal #1: 3 weeks PLOF        SHORT GOAL #1: pt will ambulate 150 ft indep, SpO2>90%  ?????????????????????? Time Frame : 1 week  SHORT GOAL #2: pt will negotiate 2 steps with rails mod I  ?????????????????????? Time Frame : 1 week ??????????????????????       Prognosis:?? Good  Positive Indicators: family support, PLOF  Barriers to Discharge: Endurance deficits     SUBJECTIVE  Patient reports: I am careful, I don't go out like I used to anymore.  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 Cataract 2005    cataract surgery   ??? COPD (chronic obstructive pulmonary disease) (CMS-HCC) 10/07/2016   ??? DJD (degenerative joint disease), lumbar    ??? Hypertension    ??? Tobacco abuse 02/15/2014            Social History     Tobacco Use   ??? Smoking status: Current Every Day Smoker     Packs/day: 1.00     Years: 4.00     Pack years: 4.00     Types: Cigarettes   ??? Smokeless tobacco: Never Used   ??? Tobacco comment: quit for 25 years, restarted  in 2014   Substance Use Topics   ??? Alcohol use: Yes     Alcohol/week: 2.0 standard drinks     Types: 2 Cans of beer per week     Comment: daily       Past Surgical History:   Procedure Laterality Date   ??? CARDIAC VALVE REPLACEMENT      triple bypass   ??? CATARACT EXTRACTION     ??? CORONARY ARTERY BYPASS GRAFT  2001    x3   ??? EYE SURGERY  2005    cataracts   ??? PR REPAIR ING HERNIA,5+Y/O,REDUCIBL Left 06/19/2015    Procedure: REPAIR INITIAL INGUINAL HERNIA, AGE 74 YEARS OR OLDER; REDUCIBLE;  Surgeon: Shelda Jakes, MD;  Location: [facility] OR [facility];  Service: Trauma   ??? PR TEMPORAL ARTERY LIGATN OR BX Left 10/23/2017    Procedure: LIGATION OR BIOPSY, TEMPORAL ARTERY;  Surgeon: Vivien Rossetti, MD;  Location[facility];  Service: ENT   ??? PR UPPER GI ENDOSCOPY,DIAGNOSIS N/A 12/17/2017    Procedure: UGI ENDO, INCLUDE ESOPHAGUS, STOMACH, & DUODENUM &/OR JEJUNUM; DX W/WO COLLECTION SPECIMN, BY BRUSH OR WASH;  Surgeon: Bluford Kaufmann, MD;  [facility];  Service: Gastroenterology             Family History   Problem Relation Age of Onset   ??? Thyroid disease Sister    ??? Cancer Brother    ??? Glaucoma Neg Hx    ??? Macular degeneration Neg Hx    ??? Retinal detachment Neg Hx    ??? Substance Abuse Disorder Neg Hx    ??? Mental illness Neg Hx         Allergies: Patient has no known allergies.                  Objective Findings  Precautions / Restrictions  Precautions: Non-applicable  Weight Bearing Status: Non-applicable  Required Braces or Orthoses: Non-applicable     Communication Preference: Verbal          Pain Comments: no complaints of pain  Medical Tests / Procedures: reviewed in EPIC  Equipment / Environment: Patient not wearing mask for full session, Caregiver wearing mask for full session, Vascular access (PIV, TLC, Port-a-cath, PICC), Supplemental oxygen     At Rest: SpO2 91% on 2 L [place]  With Activity: SpO2 91-94% on 2 L [place]  Orthostatics: asymptomatic  Airway Clearance: mobility, purse lip breathing     Living Situation  Living Environment: House  Lives With: Alone (many family members close by who can assist; granddaughter manages finances)  Home Living: One level home, Tub/shower unit, Standard height toilet, Stairs to enter with rails  Rail placement (outside): Bilateral rails  Number of Stairs to Enter (outside): 2      Cognition: WFL  Visual/Perception: Wears Glasses/Contacts     Skin Inspection: Intact where visualized     Upper Extremities  UE ROM: Left Impaired/Limited, Right Impaired/Limited  RUE ROM Impairment: Limited AROM  LUE ROM Impairment: Limited AROM  UE comment: limitations but functional with shoulder flexion B    Lower Extremities  LE Strength: Right WFL, Left WFL     Coordination: WFL  Sensation: WFL  Balance: Impaired dynamic standing balance  Posture comment: kyphosis, unable to stand upright      Bed Mobility: Supine to Sit  Supine to Sit assistance level: Modified independent, requires aide device or extra time  Bed Mobility: from semi-fowlers  Transfers: Sit to Stand  Sit to Stand assistance level: Standby assist, set-up cues, supervision of patient - no hands on  Transfer comments: no AD      Gait Level of Assistance: Standby assist, set-up cues, supervision of patient - no hands on  Gait Distance Ambulated (ft): 60 ft  Gait: steady, hunched over                  Endurance: new oxygen requirement without SOB     Physical Therapy Session Duration  PT Individual [mins]: 35     Medical Staff Made Aware: RN     I attest that I have reviewed the above information.  SignedDelbert Phenix, PT  Filed 10/26/2021
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
## 2021-10-26 NOTE — Unmapped (Signed)
CM spoke to Erik [name] as he was finishing up his OT session. Erik [name] lives alone but has a lot of support from his children and grandchildren. Erik [name] is alert and oriented. Erik [name] is unsure of having HH come to his home and is also not sure about going home with O2. Erik [name] son will take him home at discharge.        Care Management  Initial Transition Planning Assessment   CM met with patient in Erik [name] room.  Erik [name]/visitors were wearing hospital provided masks for the duration of the interaction.   CM was wearing hospital provided surgical mask and hospital provided eye protection.  CM was not within 6 foot of the patient/visitors during this interaction.       Type of Residence: Mailing Address:  [address]  [place] [address]  Contacts: Accompanied by: Alone  Patient Phone Number: [phone] (home)           Medical Provider(s): Jenell Milliner, MD  Reason for Admission: Admitting Diagnosis:  COPD exacerbation (CMS-HCC) [J44.1]  Pneumonia of left lower lobe due to infectious organism [J18.9]  Past Medical History:   has a past medical history of Cataract (2005), COPD (chronic obstructive pulmonary disease) (CMS-HCC) (10/07/2016), DJD (degenerative joint disease), lumbar, Hypertension, and Tobacco abuse (02/15/2014).  Past Surgical History:   has a past surgical history that includes Coronary artery bypass graft (2001); Eye surgery (2005); pr repair ing hernia,7+y/o,reducibl (Left, 06/19/2015); Cataract extraction; pr temporal artery ligatn or bx (Left, 10/23/2017); Cardiac valve replacement; and pr upper gi endoscopy,diagnosis (N/A, 12/17/2017).   Previous admit date: 12/15/2017    Primary Insurance- Payor: Advertising copywriter MEDICARE ADV / Plan: Advertising copywriter MEDICARE ADV / Product Type: *No Product type* /   Secondary Insurance - None  Prescription Coverage - see above  Preferred Pharmacy - [id number] - [place], [place] - [address] [place] [address]  [facility]  [name] [place] - [address]  [id number] - [place], [place] - [address]  [facility]    Transportation home: Private vehicle, son                   Social research officer, government assessed the patient by : In person interview with patient  Orientation Level: Oriented X4  Functional level prior to admission: Independent  Reason for referral: Discharge Planning, Home Health, Durable Medical Equipment    Contact/Decision Maker  Extended Emergency Contact Information  Primary Emergency Contact: Joycelyn Das [place]  Home Phone: [phone]  Relation: Son  Secondary Emergency Contact: Beryle Beams [place]  Home Phone: [phone]  Relation: Son    Armed forces operational officer Next of Kin / Guardian / POA / Advance Directives     HCDM (patient stated preference): Rea, Reser - [phone]    Advance Directive (Medical Treatment)  Does patient have an advance directive covering medical treatment?: Patient has advance directive covering medical treatment, copy not in chart.  Advance directive covering medical treatment not in Chart:: Copy requested from medical records    Health Care Decision Maker [HCDM] (Medical & Mental Health Treatment)  Healthcare Decision Maker: HCDM documented in the HCDM/Contact Info section.  Information offered on HCDM, Medical & Mental Health advance directives:: Patient declined information.    Advance Directive (Mental Health Treatment)  Does patient have an advance directive covering mental  health treatment?: Patient does not have advance directive covering mental health treatment.  Reason patient does not have an advance directive covering mental health treatment:: Patient does not wish to complete one at this time.    Readmission Information    Have you been hospitalized in the last 30 days?: No         Patient Information  Lives with: Alone    Type of Residence: Private residence        Location/Detail: [address] [name] [address]    Support Systems/Concerns: Children, Family Members, Friends/Neighbors    Responsibilities/Dependents at home?: No    Home Care services in place prior to admission?: No          Outpatient/Community Resources in place prior to admission: Clinic  Agency detail (Name/Phone #): Jenell Milliner    Equipment Currently Used at Home: cane, quad       Currently receiving outpatient dialysis?: No       Financial Information       Need for financial assistance?: No       Social Determinants of Health  Social Determinants of Health were addressed in provider documentation.  Please refer to patient history.  Social Determinants of Health     Tobacco Use: High Risk   ??? Smoking Tobacco Use: Current Every Day Smoker   ??? Smokeless Tobacco Use: Never Used   Alcohol Use: Not on file   Financial Resource Strain: Low Risk    ??? Difficulty of Paying Living Expenses: Not hard at all   Food Insecurity: No Food Insecurity   ??? Worried About Programme researcher, broadcasting/film/video in the Last Year: Never true   ??? Ran Out of Food in the Last Year: Never true   Transportation Needs: No Transportation Needs   ??? Lack of Transportation (Medical): No   ??? Lack of Transportation (Non-Medical): No   Physical Activity: Not on file   Stress: Not on file   Social Connections: Not on file   Intimate Partner Violence: Not on file   Depression: Not at risk   ??? PHQ-2 Score: 0   Housing/Utilities: Low Risk    ??? Within the past 12 months, have you ever stayed: outside, in a car, in a tent, in an overnight shelter, or temporarily in someone else's home (i.e. couch-surfing)?: No   ??? Are you worried about losing your housing?: No   ??? Within the past 12 months, have you been unable to get utilities (heat, electricity) when it was really needed?: No   Substance Use: Not on file   Health Literacy: Not on file       Complex Discharge Information    Is patient identified as a difficult/complex discharge?: No     Interventions:       Discharge Needs Assessment  Concerns to be Addressed: discharge planning    Clinical Risk Factors: > 65, Lives Alone or Absence of Caregiver to Assist with Discharge and Home Care, Poor Health Literacy, Principal Diagnosis: Cancer, Stroke, COPD, Heart Failure, AMI, Pneumonia, Joint Replacment    Barriers to taking medications: No    Prior overnight hospital stay or ED visit in last 90 days: No              Anticipated Changes Related to Illness: none    Equipment Needed After Discharge: other (see comments) (TBD)    Discharge Facility/Level of Care Needs:      Readmission  Risk of Unplanned Readmission Score:  %  Predictive Model Details   No score data available for [facility] Risk of Unplanned Readmission     Readmitted Within the Last 30 Days? (No if blank)   Patient at risk for readmission?: Yes    Discharge Plan  Screen findings are: Discharge planning needs identified or anticipated (Comment).    Expected Discharge Date:     Expected Transfer from Critical Care:  (N/A)    Quality data for continuing care services shared with patient and/or representative?: Yes  Patient and/or family were provided with choice of facilities / services that are available and appropriate to meet post hospital care needs?: Yes       Initial Assessment complete?: Yes

## 2021-10-26 NOTE — Unmapped (Signed)
[facility] History and Physical    Assessment/Plan:    Principal Problem:    COPD exacerbation (CMS-HCC)  Active Problems:    Essential hypertension, benign    Chronic back pain    CAD (coronary artery disease), autologous vein bypass graft    Tobacco abuse counseling    Tobacco abuse    BPH (benign prostatic hyperplasia)  Resolved Problems:    * No resolved hospital problems. *      Erik [name] is a 87 y.o. male with PMHx COPD hypertension, CAD status post CABG,, BPH, tobacco use, hyperlipidemia, giant cell arteritis on Actemra, as reviewed in the EMR who presented to [facility] with COPD exacerbation (CMS-HCC).      Acute Hypoxic respiratory failure - COPD Exacerbation, presents with 24 hours of worsening productive cough and dyspnea in the setting of new oxygen requirement with 2 L nasal cannula in the ED. He has a 30-pack-year smoking history and current active tobacco use over the past 6 years. Exam with reduced air movement and diffuse expiratory wheezes. Chest x-ray with left basilar opacity, and in the setting of his productive cough could represent bacterial pneumonia.  No PFTs have been completed previously but does carry a diagnosis of COPD, only has albuterol inhaler at home.  Will treat empirically for CAP and COPD exacerbation, with RPP to rule out viral causes so could de-escalate antibiotics if positive.  Likely falls within group C COPD and would benefit from LAMA at discharge.  Does appear that affordability has been issue in the past.   -Consider [facility] as patient would like to go home  -Ceftriaxone (11/4-  -Azithromycin 500 mg x 3 days (11/4-  -Status post IV Solu-Medrol in ED, start prednisone 40 mg daily x4 days (11/5-  -Send RPP  -4 times daily DuoNebs  -Airway clearance with [employer]  -Goal O2 sat > 88%  -Inpatient consult to tobacco cessation  -LAMA at discharge, outpatient PFTs    HTN  -Continue home amlodipine 10 mg daily, hydralazine 25 mg daily, losartan 100 mg daily    CAD s/p CABG  -Continue home atenolol 50 mg twice daily  - Continue home aspirin  - Continue home statin with formulary equivalent    Giant cell arteritis, receives infusions of Actemra, has 1 being delivered tomorrow.    BPH  - Continue home Flomax      Cognitive assessment:  CAM (Confusion Assessment Method): Negative for evidence of delirium  A positive CAM is defined by the presence of:  BOTH Acute onset & fluctuating course AND Inattention  PLUS EITHER Disorganized thinking OR Altered level of consciousness    Other cognitive assessment: N/A    FEN:  N/AA    Prophylaxis:  - DVT:SQ enoxaparin    Advance Care Planning & Code Status:  Advance Care Planning   Discussion/Action: He currently is living independently in a house in [place].  He greatly values his independence, and states he does not want to suffer .  States he lived my life .  Discussed CPR/intubation, and how they would likely not lead to him being readily independent.  Discussed with patient and he decided that he would like to be DNR/DNI.    Health Care Decision Maker as of 10/26/2021    HCDM (patient stated preference): Erik [name], Erik [name] - [phone]                 Surrogate decision maker: Adult children    Disposition:  Med a floor  Emergency Contact:  Extended Emergency Contact Information  Primary Emergency Contact: Erik [name] [place]  Home Phone: [phone]  Relation: Son  Secondary Emergency Contact: Erik [name] [place]  Home Phone: [phone]  Relation: Son    ___________________________________________________________________    Chief Complaint:  Chief Complaint   Patient presents with   ??? Shortness of Breath     COPD exacerbation (CMS-HCC)    PCP: Erik Milliner, MD    HPI:  Erik [name] is a 87 y.o. male with PMHx as reviewed in the EMR who presented to [facility] with COPD exacerbation (CMS-HCC).    States that yesterday morning, he woke up feeling fine in his usual state of health.  3 hours after he woke up, he started having a productive cough and runny nose.  This progressively worsened throughout the day, and at 11:30 PM called his son because he was not able to sleep.  He had to prop up the head of the bed because he was feeling so short of breath and gasping for air.  Denies any fevers but says his ears felt warm, no nausea, vomiting, chest pain, lightheadedness/dizziness/syncope.    In the ED, patient was initially satting in the 80s on room air, satting 95% on 2 L nasal cannula.  He was afebrile, nontachycardic, normotensive.  Initial labs significant for No leukocytosis, mildly depressed hemoglobin to 11.6, unremarkable CMP and 3 negative troponins.  He was COVID/flu/RSV negative.  Chest x-ray with hazy opacity at the left base, EKG sinus rhythm with mild ST depressions in the anterolateral leads.  He was administered DuoNeb x3, ceftriaxone, azithromycin, 1 L LR, 125 mg IV Solu-Medrol, and 15 mg of Toradol.  He was admitted to medicine for further management.    He is a current smoker for the past 6 years 1 pack a day, and previously had quit for 20 years prior but has a total of 30-pack-year smoking history.  He lives by himself and [facility].  He used to work at a [employer] in [place].  His daughter passed away last year after an accidental drug overdose, and he has been living independently since then.      Allergies:  Patient has no known allergies.    Medications:   Prior to Admission medications    Medication Dose, Route, Frequency   albuterol HFA 90 mcg/actuation inhaler INHALE 2 PUFFS BY MOUTH EVERY 6 HOURS AS NEEDED FOR WHEEZING   amLODIPine (NORVASC) 10 MG tablet 10 mg, Oral, Daily (standard)   ascorbic acid (VITAMIN C) 500 MG tablet 500 mg, Oral, Daily (standard)   aspirin (ECOTRIN) 81 MG tablet 81 mg, Oral, Daily (standard)   atenoloL (TENORMIN) 50 MG tablet 50 mg, Oral, 2 times a day (standard)   cyanocobalamin, vitamin B-12, 1000 MCG tablet 1,000 mcg, Oral, Daily (standard)   hydrALAZINE (APRESOLINE) 25 MG tablet 25 mg, Oral, Daily (standard)   losartan (COZAAR) 100 MG tablet 100 mg, Oral, Daily (standard)   multivitamin (TAB-A-VITE/THERAGRAN) per tablet 1 tablet, Oral, Daily (standard)   oxyCODONE-acetaminophen (PERCOCET) 5-325 mg per tablet 1 tablet, Oral, Every 6 hours PRN   simvastatin (ZOCOR) 10 MG tablet 10 mg, Oral, Daily (standard)   tamsulosin (FLOMAX) 0.4 mg capsule 0.4 mg, Oral, Daily (standard)   tocilizumab (ACTEMRA) 162 mg/0.9 mL Syrg subcutaneous injection Inject the contents of one syringe (162 mg total) under the skin every fourteen (14) days.       Medical History:  Past Medical History:   Diagnosis Date   ??? Cataract 2005    cataract surgery   ??? COPD (chronic obstructive pulmonary disease) (CMS-HCC) 10/07/2016   ??? DJD (degenerative joint disease), lumbar    ??? Hypertension    ??? Tobacco abuse 02/15/2014       Surgical History:  Past Surgical History:   Procedure Laterality Date   ??? CARDIAC VALVE REPLACEMENT      triple bypass   ??? CATARACT EXTRACTION     ??? CORONARY ARTERY BYPASS GRAFT  2001    x3   ??? EYE SURGERY  2005    cataracts   ??? PR REPAIR ING HERNIA,5+Y/O,REDUCIBL Left 06/19/2015    Procedure: REPAIR INITIAL INGUINAL HERNIA, AGE 79 YEARS OR OLDER; REDUCIBLE;  Surgeon: Shelda Jakes, MD;  [facility];  Service: Trauma   ??? PR TEMPORAL ARTERY LIGATN OR BX Left 10/23/2017    Procedure: LIGATION OR BIOPSY, TEMPORAL ARTERY;  Surgeon: Vivien Rossetti, MD;  Location: [facility] OR [facility];  Service: ENT   ??? PR UPPER GI ENDOSCOPY,DIAGNOSIS N/A 12/17/2017    Procedure: UGI ENDO, INCLUDE ESOPHAGUS, STOMACH, & DUODENUM &/OR JEJUNUM; DX W/WO COLLECTION SPECIMN, BY BRUSH OR WASH;  Surgeon: Bluford Kaufmann, MD;  [facility]: [facility];  Service: Gastroenterology       Social History:  Social History     Socioeconomic History   ??? Marital status: Widowed   Tobacco Use   ??? Smoking status: Current Every Day Smoker     Packs/day: 0.50 Years: 4.00     Pack years: 2.00   ??? Smokeless tobacco: Never Used   ??? Tobacco comment: quit for 25 years, restarted 2014   Vaping Use   ??? Vaping Use: Never used   Substance and Sexual Activity   ??? Alcohol use: Yes     Alcohol/week: 2.0 standard drinks     Types: 2 Cans of beer per week     Comment: daily   ??? Drug use: No   Other Topics Concern   ??? Do you use sunscreen? No   ??? Tanning bed use? No   ??? Are you easily burned? No   ??? Excessive sun exposure? No   ??? Blistering sunburns? Yes     Comment: Past   Social History Narrative    Lives in [place], wife passed 7 years ago. His daughter lives with him. Granddaughter, Victorino Dike, manages his finances.        Living situation:   Patient lives in own home    ADLs:  Feeding: Independent  Dressing: Independent  Ambulation: Independent  Toileting: Independent  Bathing: Independent    IADLs:  Using the telephone: Independent  Shopping: Independent  Meal preparation: Independent  Medication management: Independent  Managing finances: Independent  Housework: Solicitor (driving or navigating public transit): Unknown     Assistive devices: None    Family History:  Family History   Problem Relation Age of Onset   ??? Thyroid disease Sister    ??? Cancer Brother    ??? Glaucoma Neg Hx    ??? Macular degeneration Neg Hx    ??? Retinal detachment Neg Hx    ??? Substance Abuse Disorder Neg Hx    ??? Mental illness Neg Hx        Review of Systems:  10 systems reviewed and are negative unless otherwise mentioned in HPI    Labs/Studies:  Labs and Studies from the last 24hrs per EMR  and Reviewed    Physical Exam:  Temp:  [37.1 ??C (98.7 ??F)-37.3 ??C (99.1 ??F)] 37.3 ??C (99.1 ??F)  Heart Rate:  [73-115] 104  Resp:  [12-20] 20  BP: (112-161)/(46-60) 115/55  SpO2:  [94 %-99 %] 94 %    General: Alert and oriented x3, in no acute distress, patient resting comfortably in bed  HEENT: PERRLA  Heart: RRR no murmurs, no JVD  Lung: Diffuse end expiratory wheezes bilaterally, reduced air movement, normal work of breathing  Abd: Soft, non-tender, non-distended, non-tympanic, +Bowel sounds  Ext: No edema,  Neuro: Cranial Nerves II-XII grossly intact  Skin: No rashes    Hubbard Robinson  Internal Medicine PGY-3

## 2021-10-26 NOTE — Unmapped (Signed)
[facility]  Emergency Department Provider Note       HPI, ED Course, Assessment and Plan     Erik [name] is a 87 y.o. male BIB EMS with a history of COPD, DJD, and hypertension who presents for evaluation of shortness of breath.     The patient reports a new productive cough with yellow sputum that began after he drank coffee this morning. He also endorses shortness of breath today. The patient was satting in the 80s upon EMS arrival. EMS gave 1 duo-neb treatment. He has a 6 year history of smoking about 1/2 pack of tobacco cigarettes per day.  He smoked prior to this, but then stopped for a number years.  He does not wear O2 at home. He has an inhaler at home. He denies chest pain or fevers.     BP 112/46  - Pulse 108  - Temp 37.1 ??C (98.7 ??F) (Oral)  - Resp 12  - SpO2 95%      On exam, he is chronically-ill appearing and coughing in the bed. He has expiratory wheezing with diminished breath sounds bilaterally.  He is currently wearing 2 L nasal cannula.  No murmurs. Abdomen is soft and non-tender. Lower extremities without tenderness or edema.     Differential includes COPD exacerbation, pneumonia, ACS, viral infection amongst others    Further ED updates and updates to plan as per ED Course below:    ED Course as of 10/26/21 0452   [facility] Oct 26, 2021   0149 EKG in sinus rhythm, rate of 70.  No ST segment changes.  Inverted T waves in V5 and V6 consistent with prior EKGs   0445 After 3 DuoNeb's patient started complaining of neck, chest, back pain.  Repeat EKG shows ST segment depressions in V4, V5, V6.  ST segment elevation in aVR.  Possibly related to a subendocardial ischemia secondary to his increased heart rate after albuterol.  Fortunately patient has had 2 negative troponins thus far.  We will give him 1 L fluids and reassess.  Additionally after 3 DuoNeb's patient still hypoxic to 87% on room air requiring 2 L nasal cannula.   0450 Chest x-ray with a hazy opacity in the left lower lung base. Considering this, his shortness of breath, increased sputum production.  Will treat for pneumonia.          Past History     PAST MEDICAL HISTORY/PAST SURGICAL HISTORY:   Past Medical History:   Diagnosis Date   ??? Cataract 2005    cataract surgery   ??? COPD (chronic obstructive pulmonary disease) (CMS-HCC) 10/07/2016   ??? DJD (degenerative joint disease), lumbar    ??? Hypertension    ??? Tobacco abuse 02/15/2014       Past Surgical History:   Procedure Laterality Date   ??? CARDIAC VALVE REPLACEMENT      triple bypass   ??? CATARACT EXTRACTION     ??? CORONARY ARTERY BYPASS GRAFT  2001    x3   ??? EYE SURGERY  2005    cataracts   ??? PR REPAIR ING HERNIA,5+Y/O,REDUCIBL Left 06/19/2015    Procedure: REPAIR INITIAL INGUINAL HERNIA, AGE 29 YEARS OR OLDER; REDUCIBLE;  Surgeon: Shelda Jakes, MD;  [facility]: [facility];  Service: Trauma   ??? PR TEMPORAL ARTERY LIGATN OR BX Left 10/23/2017    Procedure: LIGATION OR BIOPSY, TEMPORAL ARTERY;  Surgeon: Vivien Rossetti, MD;  [facility];  Service: ENT   ???  PR UPPER GI ENDOSCOPY,DIAGNOSIS N/A 12/17/2017    Procedure: UGI ENDO, INCLUDE ESOPHAGUS, STOMACH, & DUODENUM &/OR JEJUNUM; DX W/WO COLLECTION SPECIMN, BY BRUSH OR WASH;  Surgeon: Bluford Kaufmann, MD;  Location: [facility];  Service: Gastroenterology       MEDICATIONS:     Current Facility-Administered Medications:   ???  azithromycin (ZITHROMAX) tablet 500 mg, 500 mg, Oral, Once, Carmelia Bake., MD  ???  cefTRIAXone (ROCEPHIN) 1 g in sodium chloride 0.9 % (NS) 100 mL IVPB-connector bag, 1 g, Intravenous, Once, Carmelia Bake., MD  ???  lactated ringers bolus 1,000 mL, 1,000 mL, Intravenous, Once, Carmelia Bake., MD, 1,000 mL at 10/26/21 0447    Current Outpatient Medications:   ???  albuterol HFA 90 mcg/actuation inhaler, INHALE 2 PUFFS BY MOUTH EVERY 6 HOURS AS NEEDED FOR WHEEZING, Disp: 9 g, Rfl: 0  ???  amLODIPine (NORVASC) 10 MG tablet, Take 1 tablet (10 mg total) by mouth daily., Disp: 90 tablet, Rfl: 3  ???  ascorbic acid (VITAMIN C) 500 MG tablet, Take 500 mg by mouth daily., Disp: , Rfl:   ???  aspirin (ECOTRIN) 81 MG tablet, Take 81 mg by mouth daily., Disp: , Rfl:   ???  atenoloL (TENORMIN) 50 MG tablet, Take 1 tablet (50 mg total) by mouth Two (2) times a day., Disp: 180 tablet, Rfl: 3  ???  cyanocobalamin, vitamin B-12, 1000 MCG tablet, Take 1,000 mcg by mouth daily., Disp: , Rfl:   ???  hydrALAZINE (APRESOLINE) 25 MG tablet, Take 1 tablet (25 mg total) by mouth daily., Disp: 90 tablet, Rfl: 3  ???  losartan (COZAAR) 100 MG tablet, Take 1 tablet (100 mg total) by mouth daily., Disp: 90 tablet, Rfl: 3  ???  multivitamin (TAB-A-VITE/THERAGRAN) per tablet, Take 1 tablet by mouth daily., Disp: , Rfl:   ???  oxyCODONE-acetaminophen (PERCOCET) 5-325 mg per tablet, Take 1 tablet by mouth every six (6) hours as needed for pain., Disp: 120 tablet, Rfl: 0  ???  simvastatin (ZOCOR) 10 MG tablet, Take 1 tablet (10 mg total) by mouth daily., Disp: 90 tablet, Rfl: 3  ???  tamsulosin (FLOMAX) 0.4 mg capsule, Take 1 capsule (0.4 mg total) by mouth daily., Disp: 90 capsule, Rfl: 3  ???  tocilizumab (ACTEMRA) 162 mg/0.9 mL Syrg subcutaneous injection, Inject the contents of one syringe (162 mg total) under the skin every fourteen (14) days., Disp: 6 mL, Rfl: 3    ALLERGIES:   Patient has no known allergies.    SOCIAL HISTORY:   Social History     Tobacco Use   ??? Smoking status: Current Every Day Smoker     Packs/day: 0.50     Years: 4.00     Pack years: 2.00   ??? Smokeless tobacco: Never Used   ??? Tobacco comment: quit for 25 years, restarted 2014   Substance Use Topics   ??? Alcohol use: Yes     Alcohol/week: 2.0 standard drinks     Types: 2 Cans of beer per week     Comment: daily       FAMILY HISTORY:  Family History   Problem Relation Age of Onset   ??? Thyroid disease Sister    ??? Cancer Brother    ??? Glaucoma Neg Hx    ??? Macular degeneration Neg Hx    ??? Retinal detachment Neg Hx    ??? Substance Abuse Disorder Neg Hx    ??? Mental illness Neg Hx  Review of Systems    Constitutional: Negative for fever.  Eyes: Negative for visual changes.  ENT: Negative for sore throat.  Cardiovascular: Negative for chest pain.  Respiratory: Positive for shortness of breath and cough.  Gastrointestinal: Negative for abdominal pain  Genitourinary: Negative for dysuria.  Musculoskeletal: Negative for back pain.  Skin: Negative for rash.  Neurological: Negative focal weakness or numbness     Physical Exam     Vitals:    10/26/21 0044 10/26/21 0047 10/26/21 0408 10/26/21 0443   BP:  161/60 112/46    Pulse: 73  115 108   Resp: 18  15 12    Temp: 37.1 ??C (98.7 ??F)      TempSrc: Oral      SpO2: 95%  99% 95%        Constitutional: Chronically-ill appearing and coughing in the bed.   Eyes: Conjunctivae are normal.  HEENT: Normocephalic and atraumatic.   Cardiovascular: See heart rate listed above. Normal skin perfusion. No murmurs.  Respiratory: See respiratory rate listed above. Speaking easily in full sentences. He has expiratory wheezing with diminished breath sounds bilaterally.   Gastrointestinal: Abdomen is soft and non-tender.  Musculoskeletal: Lower extremities without tenderness or edema.   Neurologic: Normal speech and language. No gross focal neurologic deficits are appreciated. Patient is moving all extremities equally, face is symmetric at rest and with speech.   Skin: Skin is warm, dry and intact.  Psychiatric: Mood and affect are normal.       Radiology     XR Chest Portable   Preliminary Result      Hazy opacity at the left lung base, which may represent atelectasis versus infection.            Laboratory Data     Lab Results   Component Value Date    WBC 10.0 10/26/2021    HGB 11.6 (L) 10/26/2021    HCT 33.2 (L) 10/26/2021    PLT 150 10/26/2021       Lab Results   Component Value Date    NA 134 (L) 10/26/2021    K 4.3 10/26/2021    CL 101 10/26/2021    CO2 24.8 10/26/2021    BUN 10 10/26/2021    CREATININE 0.93 10/26/2021    GLU 105 10/26/2021 CALCIUM 9.7 10/26/2021    MG 1.7 12/19/2017       Lab Results   Component Value Date    BILITOT 1.0 10/26/2021    PROT 5.9 10/26/2021    ALBUMIN 4.1 10/26/2021    ALT 16 10/26/2021    AST 27 10/26/2021    ALKPHOS 42 (L) 10/26/2021       Lab Results   Component Value Date    INR 0.86 12/15/2017    APTT 27.1 (L) 12/15/2017       Portions of this record have been created using [employer]. Dictation errors have been sought, but may not have been identified and corrected.    Documentation assistance was provided by Johnathan Hausen, Scribe on October 26, 2021 at 12:49 AM for Jamelle Rushing, MD.       Documentation assistance was provided by the scribe in my presence.  The documentation recorded by the scribe has been reviewed by me and accurately reflects the services I personally performed.     Carmelia Bake., MD  Resident  10/26/21 [phone]

## 2021-10-26 NOTE — Unmapped (Signed)
GERIATRICS SPECIFIC ADVANCED CARE PLANNING NOTE    Below we outline various components of a comprehensive geriatric assessment.  This includes a brief synopsis of patient's baseline level of function, living situation, social support and previously expressed or current goals for their medical care.  We used this information to guide the advanced care planning discussion that is described in detail below.    [name] is a 87 year old man admitted with COPD exacerbation complicated by a finding of a lower lobe infiltrate. He is presently being treated for pneumonia. He reports that he is already feeling better.     Erik [name] presently lives in [place], [place]. He has two adult sons as well as several grandchildren all within the area and one grand daughter who lives three blocks away. He currently lives in his own house which he used to share with an adult daughter, she unfortunately passed away a year and 1/2 ago. He does not need day to day assistance with any of his ADLS, he primarily will rely on his grandchildren to drive him to the store for groceries, other errands or MD visits. He reports that he is able to walk all around his house and empty his garbage without getting short of breath. If he does prolonged heavy housecleaning, then he will get short of breath and need to take frequent brakes. He enjoys watching college football and is looking forward to getting home tomorrow (Saturday) so he can watch [facility].     Erik [name] graduated high school then joined the [employer] for 4 years. 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           Geriatric ROS     Vision impairment:     No  Hearing impairment:     Mild  Speech impairment:   Swallowing impairment:    No Urinary incontinence:    No  Fecal incontinence:     No Constipation:     Denies  Falls (in last 12 mths):  ? 2 falls:  None yes/no  1 injurious fall:   yes/no PHQ 2: Negative      Sleep disturbance:     None  Unintentional weight loss:    Unsure.   Wt Readings from Last 12 Encounters:   10/26/21 60.2 kg (132 lb 11.2 oz)   04/16/21 64.9 kg (143 lb)   05/23/20 64.9 kg (143 lb)   01/11/20 65.8 kg (145 lb)   10/15/19 63 kg (139 lb)   04/14/19 61.2 kg (135 lb)   01/01/19 60.8 kg (134 lb)   11/05/18 59.4 kg (131 lb)   07/03/18 62.6 kg (138 lb)   04/03/18 63.5 kg (140 lb)   02/04/18 59 kg (130 lb)   01/02/18 56.7 kg (125 lb)    Behavior disturbance:     NA        Functional Assessment        ADLs:  IADLs:   Feeding: Independent  Dressing: Independent  Ambulation: Independent  Toileting: Independent  Bathing: Independent   Using the phone: Independent  Shopping: requires assistance   Meal preparation: Independent  Medication mgmt: Independent  Managing finances: Independent  Housework: Independent  Transportation (driving or navigating public transit): Requires Assistance     Living situation: Patient lives in own  home with noone (lives alone).     Changes in ADLs during hospitalization:  None     Assistive devices: None     Additional services recommended at discharge: Home health     Cognitive Assessment     Delirium Assessment: CAM (Confusion Assessment Method):    On discharge, the patient did not show evidence of delirium. .      Other cognitive assessment:  The patient scored 23/40/30 [facility] Mental Status Exam (SLUMS). This is suggestive of cognitive impairment and warrants further follow-up as an outpatient.     Advance Care Planning     Patient has decisional capacity:  Yes    Patient has selected a Health Care Decision-Maker if loses capacity: Yes      HCDM (patient stated preference): Erik [name], Erik [name] - [phone]         Communication of Medical Status/Prognosis:     Discussed current pulmonary condition. He is in agreement for treatment as we are doing. Discussed hospital at home, but he is not quite able to fully grasp this concept as he is more familiar with traditional hospital treatment in an acute care setting (as he is currently in), outpatient care or home health. He also admits to having difficulty and not using computers too much. He has a flip phone which he uses to communicate with his family.        Treatment Decisions:   Code Status: DNR and DNI  The patient has NOT completed a living will, HCPOA or MOST form. .       I spent between 16-45 minutes providing voluntary advance care planning services for this patient.       Marland Kitchen

## 2021-10-26 NOTE — Unmapped (Signed)
Pt arrived by EMS for difficulty breathing . On scene pt was in 80s rcvd 1 Duo Neb by EMS. Pt endorses coughing w yellow phlegm. Denies pain, denies N/V.

## 2021-10-26 NOTE — Unmapped (Signed)
OCCUPATIONAL THERAPY  Evaluation (10/26/21 1020)    Patient Name:  Erik [name]       Medical Record Number: [id number]   Date of Birth: Aug 23, 1934  Sex: Male          OT Treatment Diagnosis:  Decreased endurance and activity tolerance, SOB, COPD exacerbation, new supplemental O2 requirement    Assessment  Problem List: Decreased endurance, Fall Risk, Impaired balance    Assessment: Erik [name] is a 87 y.o. male with PMHx COPD hypertension, CAD status post CABG,, BPH, tobacco use, hyperlipidemia, giant cell arteritis on Actemra, as reviewed in the EMR who presented to [facility] with COPD exacerbation.    Pt presents to OT slightly below functional baseline with above stated deficits impacting functional safety and Independence in self care and ADL tasks. Pt reports Independent baseline, sons assist with transportation/errands, and pt completes all ADLs and med management Independently (able to list all medications this session). Pt required initial Supervision transitioning to Mod I without assistive device for functional transfers/mobility and bADLs without assistive device. Pt initially required cues for safety re: O2 tubing management, then able to demonstrate teachback with further functional mobility. No further acute OT needs identified, recommend post-acute OT at 3x/wk [facility] to maximize functional safety, participation, and home safety assessment.    Review of client factors, occupational history, assessment of occupational performance, and development of POC required Low complexity OT evaluation.    Today's Interventions: AMPAC 23/24, role of OT, POC, functional transfers and mobility household distance, standing tolerance and balance, standing grooming tasks Mod I, safety cues for managing O2 tubing, endurance, activity tolerance, energy coservation strategies    Activity Tolerance During Today's Session  Tolerated treatment well    Plan  Planned Frequency of Treatment:  D/C Services for: D/C Services Post-Discharge Occupational Therapy Recommendations:   3x weekly   OT DME Recommendations: Shower stool -        GOALS:   Patient and Family Goals: Get back home and Independence.    IP Long Term Goal #1: Pt will maximize functional Independence in self care routines in 2 weeks.     Prognosis:  Good  Positive Indicators:  PLOF, motivation, participation  Barriers to Discharge: None    Subjective  Current Status Received/left in recliner, all needs within reach, CM in room, RN aware  Prior Functional Status Pt reports Independence with all self care and ADLs, does not use assistive device. Pt used to enjoy gardening but has not done so recently 2/2 safety concerns and decreased endurance. Pt completes cooking, cleaning, and med management Independently- sons pick up prescriptions and drive pt as pt no longer drives. Pt enjoys drinking beer, coffee, and smoking cigarettes.    Medical Tests / Procedures: Reviewed  Services patient receives: PT    Patient / Caregiver reports: Man, I used to drink quite a bit    Past Medical History:   Diagnosis Date   ??? Cataract 2005    cataract surgery   ??? COPD (chronic obstructive pulmonary disease) (CMS-HCC) 10/07/2016   ??? DJD (degenerative joint disease), lumbar    ??? Hypertension    ??? Tobacco abuse 02/15/2014    Social History     Tobacco Use   ??? Smoking status: Current Every Day Smoker     Packs/day: 1.00     Years: 4.00     Pack years: 4.00     Types: Cigarettes   ??? Smokeless tobacco: Never Used   ??? Tobacco comment:  quit for 25 years, restarted in 2014   Substance Use Topics   ??? Alcohol use: Yes     Alcohol/week: 2.0 standard drinks     Types: 2 Cans of beer per week     Comment: daily      Past Surgical History:   Procedure Laterality Date   ??? CARDIAC VALVE REPLACEMENT      triple bypass   ??? CATARACT EXTRACTION     ??? CORONARY ARTERY BYPASS GRAFT  2001    x3   ??? EYE SURGERY  2005    cataracts   ??? PR REPAIR ING HERNIA,5+Y/O,REDUCIBL Left 06/19/2015    Procedure: REPAIR INITIAL INGUINAL HERNIA, AGE 41 YEARS OR OLDER; REDUCIBLE;  Surgeon: Shelda Jakes, MD;  [facility];  Service: Trauma   ??? PR TEMPORAL ARTERY LIGATN OR BX Left 10/23/2017    Procedure: LIGATION OR BIOPSY, TEMPORAL ARTERY;  Surgeon: Vivien Rossetti, MD;  [facility];  Service: ENT   ??? PR UPPER GI ENDOSCOPY,DIAGNOSIS N/A 12/17/2017    Procedure: UGI ENDO, INCLUDE ESOPHAGUS, STOMACH, & DUODENUM &/OR JEJUNUM; DX W/WO COLLECTION SPECIMN, BY BRUSH OR WASH;  Surgeon: Bluford Kaufmann, MD;  [facility];  Service: Gastroenterology    Family History   Problem Relation Age of Onset   ??? Thyroid disease Sister    ??? Cancer Brother    ??? Glaucoma Neg Hx    ??? Macular degeneration Neg Hx    ??? Retinal detachment Neg Hx    ??? Substance Abuse Disorder Neg Hx    ??? Mental illness Neg Hx         Patient has no known allergies.     Objective Findings  Precautions / Restrictions  Falls precautions    Weight Bearing  Non-applicable    Required Braces or Orthoses  Non-applicable    Communication Preference  Verbal    Pain  No c/o pain this session    Equipment / Environment  Patient not wearing mask for full session, Vascular access (PIV, TLC, Port-a-cath, PICC) (OT wearing mask and eye protection)    Living Situation  Living Environment: House  Lives With: Alone (sons close by and assist with transportation/running errands)  Home Living: One level home, Tub/shower unit, Standard height toilet, Stairs to enter with rails  Rail placement (outside): Bilateral rails  Number of Stairs to Enter (outside): 2  Equipment available at home: [employer]   Orientation Level:  Oriented x 4   Arousal/Alertness:  Appropriate responses to stimuli   Attention Span:  Appears intact   Memory:  Appears intact   Following Commands:  Follows all commands and directions without difficulty   Safety Judgment:  Good awareness of safety precautions   Awareness of Errors:  Good awareness of safety precautions   Problem Solving:  Able to problem solve independently   Comments:      Vision / Hearing   Vision: Wears glasses all the time     Hearing: Mild impairment         Hand Function:  Right Hand Function: Right hand grip strength, ROM and coordination WNL  Left Hand Function: Left hand grip strength, ROM and coordination WNL  Hand Dominance: Right    Skin Inspection:  Skin Inspection: Intact where visualized    ROM / Strength:  UE ROM/Strength: Left WFL, Right WFL  LE ROM/Strength: Left WFL, Right WFL    Coordination:  Coordination:  WFL    Sensation:  RUE Sensation: RUE intact  LUE Sensation: LUE intact  RLE Sensation: RLE intact  LLE Sensation: LLE intact    Balance:  Static and dynamic sitting balance=Mod I; static and dynamic standing balance=Initial Supervision progressing to Mod I without assistive device; pt with kyphotic posture (reports his baseline for ~20 years)    Functional Mobility  Transfer Assistance Needed: No (sit<>stand from recliner Mod I without assistive device)  Bed Mobility Assistance Needed:  (NT)  Ambulation: Functional mobility household distance with kyphotic, forward-flexed posture with initial Supervision progressing to Mod I without assistive device; min safety cues initially for O2 tubing management      ADLs  ADLs: Modified Independent, Supervision (Recommend Supervision for bathing/showers)      Vitals / Orthostatics  At Rest: NAD on 2L O2 [place]  With Activity: SpO2 95% after functional activity on 2L O2 [place]; no SOB or WOB noted, though pt coughing frequently  Orthostatics: No signs/symptoms      Medical Staff Made Aware: RN Alma Downs, CM Tiffany      Occupational Therapy Session Duration  OT Individual [mins]: 30         I attest that I have reviewed the above information.  Signed: Jessica Priest, OT  [facility] 10/26/2021

## 2021-10-27 LAB — HEMOGLOBIN AND HEMATOCRIT, BLOOD
HEMATOCRIT: 29.9 % — ABNORMAL LOW (ref 39.0–48.0)
HEMOGLOBIN: 10.2 g/dL — ABNORMAL LOW (ref 12.9–16.5)

## 2021-10-27 LAB — CBC
HEMATOCRIT: 29.4 % — ABNORMAL LOW (ref 39.0–48.0)
HEMOGLOBIN: 10.1 g/dL — ABNORMAL LOW (ref 12.9–16.5)
MEAN CORPUSCULAR HEMOGLOBIN CONC: 34.4 g/dL (ref 32.0–36.0)
MEAN CORPUSCULAR HEMOGLOBIN: 33.9 pg — ABNORMAL HIGH (ref 25.9–32.4)
MEAN CORPUSCULAR VOLUME: 98.7 fL — ABNORMAL HIGH (ref 77.6–95.7)
MEAN PLATELET VOLUME: 7.9 fL (ref 6.8–10.7)
PLATELET COUNT: 156 10*9/L (ref 150–450)
RED BLOOD CELL COUNT: 2.97 10*12/L — ABNORMAL LOW (ref 4.26–5.60)
RED CELL DISTRIBUTION WIDTH: 13.2 % (ref 12.2–15.2)
WBC ADJUSTED: 11.7 10*9/L — ABNORMAL HIGH (ref 3.6–11.2)

## 2021-10-27 LAB — MAGNESIUM: MAGNESIUM: 1.6 mg/dL (ref 1.6–2.6)

## 2021-10-27 LAB — BASIC METABOLIC PANEL
ANION GAP: 7 mmol/L (ref 5–14)
BLOOD UREA NITROGEN: 16 mg/dL (ref 9–23)
BUN / CREAT RATIO: 16
CALCIUM: 9.3 mg/dL (ref 8.7–10.4)
CHLORIDE: 99 mmol/L (ref 98–107)
CO2: 25.7 mmol/L (ref 20.0–31.0)
CREATININE: 0.99 mg/dL
EGFR CKD-EPI (2021) MALE: 75 mL/min/{1.73_m2} (ref >=60)
GLUCOSE RANDOM: 120 mg/dL (ref 70–179)
POTASSIUM: 4.5 mmol/L (ref 3.4–4.8)
SODIUM: 132 mmol/L — ABNORMAL LOW (ref 135–145)

## 2021-10-27 MED ORDER — SPIRIVA WITH HANDIHALER 18 MCG AND INHALATION CAPSULES
ORAL_CAPSULE | Freq: Every day | RESPIRATORY_TRACT | 2 refills | 30 days | Status: CP
Start: 2021-10-27 — End: 2022-10-27

## 2021-10-27 MED ORDER — CEFDINIR 300 MG CAPSULE
ORAL_CAPSULE | Freq: Two times a day (BID) | ORAL | 0 refills | 3 days | Status: CP
Start: 2021-10-27 — End: ?

## 2021-10-27 MED ADMIN — hydrALAZINE (APRESOLINE) tablet 25 mg: 25 mg | ORAL | @ 13:00:00 | Stop: 2021-10-27

## 2021-10-27 MED ADMIN — atenoloL (TENORMIN) tablet 50 mg: 50 mg | ORAL | @ 01:00:00

## 2021-10-27 MED ADMIN — nicotine (NICODERM CQ) 21 mg/24 hr patch 1 patch: 1 | TRANSDERMAL | @ 13:00:00 | Stop: 2021-10-27

## 2021-10-27 MED ADMIN — atenoloL (TENORMIN) tablet 50 mg: 50 mg | ORAL | @ 13:00:00 | Stop: 2021-10-27

## 2021-10-27 MED ADMIN — ipratropium-albuteroL (DUO-NEB) 0.5-2.5 mg/3 mL nebulizer solution 3 mL: 3 mL | RESPIRATORY_TRACT | @ 12:00:00 | Stop: 2021-10-27

## 2021-10-27 MED ADMIN — losartan (COZAAR) tablet 100 mg: 100 mg | ORAL | @ 13:00:00 | Stop: 2021-10-27

## 2021-10-27 MED ADMIN — polyethylene glycol (MIRALAX) packet 17 g: 17 g | ORAL | @ 13:00:00 | Stop: 2021-10-27

## 2021-10-27 MED ADMIN — aspirin chewable tablet 81 mg: 81 mg | ORAL | @ 13:00:00 | Stop: 2021-10-27

## 2021-10-27 MED ADMIN — amLODIPine (NORVASC) tablet 10 mg: 10 mg | ORAL | @ 13:00:00 | Stop: 2021-10-27

## 2021-10-27 MED ADMIN — ipratropium-albuteroL (DUO-NEB) 0.5-2.5 mg/3 mL nebulizer solution 3 mL: 3 mL | RESPIRATORY_TRACT | @ 20:00:00 | Stop: 2021-10-27

## 2021-10-27 MED ADMIN — magnesium oxide (MAG-OX) tablet 400 mg: 400 mg | ORAL | @ 17:00:00 | Stop: 2021-10-27

## 2021-10-27 MED ADMIN — senna (SENOKOT) tablet 2 tablet: 2 | ORAL | @ 01:00:00

## 2021-10-27 MED ADMIN — predniSONE (DELTASONE) tablet 40 mg: 40 mg | ORAL | @ 13:00:00 | Stop: 2021-10-27

## 2021-10-27 MED ADMIN — cefTRIAXone (ROCEPHIN) 1 g in sodium chloride 0.9 % (NS) 100 mL IVPB-connector bag: 1 g | INTRAVENOUS | @ 10:00:00 | Stop: 2021-10-27

## 2021-10-27 MED ADMIN — ipratropium-albuteroL (DUO-NEB) 0.5-2.5 mg/3 mL nebulizer solution 3 mL: 3 mL | RESPIRATORY_TRACT | @ 01:00:00

## 2021-10-27 MED ADMIN — cefdinir (OMNICEF) capsule 300 mg: 300 mg | ORAL | @ 13:00:00 | Stop: 2021-10-27

## 2021-10-27 MED ADMIN — pravastatin (PRAVACHOL) tablet 20 mg: 20 mg | ORAL | @ 01:00:00

## 2021-10-27 MED ADMIN — ipratropium-albuteroL (DUO-NEB) 0.5-2.5 mg/3 mL nebulizer solution 3 mL: 3 mL | RESPIRATORY_TRACT | @ 16:00:00 | Stop: 2021-10-27

## 2021-10-27 MED ADMIN — tamsulosin (FLOMAX) 24 hr capsule 0.4 mg: .4 mg | ORAL | @ 13:00:00 | Stop: 2021-10-27

## 2021-10-27 MED ADMIN — cyanocobalamin (vitamin B-12) tablet 1,000 mcg: 1000 ug | ORAL | @ 13:00:00 | Stop: 2021-10-27

## 2021-10-27 MED ADMIN — azithromycin (ZITHROMAX) tablet 500 mg: 500 mg | ORAL | @ 10:00:00 | Stop: 2021-10-27

## 2021-10-27 NOTE — Unmapped (Signed)
Pt admitted with COPD exacerbation, continues to cough with no sputum production. Using oxygen and VSWN. Medication administered as ordered, and activities adjusted as per tolerance, Pt is mobile and with 1 assist. Bed table and call light within reach and bed in low position. Isolation measures maintained.  Problem: Adult Inpatient Plan of Care  Goal: Plan of Care Review  Outcome: Ongoing - Unchanged  Goal: Patient-Specific Goal (Individualized)  Outcome: Ongoing - Unchanged  Goal: Absence of Hospital-Acquired Illness or Injury  Outcome: Ongoing - Unchanged  Intervention: Identify and Manage Fall Risk  Recent Flowsheet Documentation  Taken 10/26/2021 2200 by Amil Amen, RN  Safety Interventions: fall reduction program maintained  Taken 10/26/2021 2000 by Roetta Sessions Jovonte Commins, RN  Safety Interventions: fall reduction program maintained  Intervention: Prevent and Manage VTE (Venous Thromboembolism) Risk  Recent Flowsheet Documentation  Taken 10/26/2021 2000 by Roetta Sessions Nalleli Largent, RN  Activity Management: activity adjusted per tolerance  Goal: Optimal Comfort and Wellbeing  Outcome: Ongoing - Unchanged  Goal: Readiness for Transition of Care  Outcome: Ongoing - Unchanged  Goal: Rounds/Family Conference  Outcome: Ongoing - Unchanged     Problem: Self-Care Deficit  Goal: Improved Ability to Complete Activities of Daily Living  Outcome: Ongoing - Unchanged     Problem: Fall Injury Risk  Goal: Absence of Fall and Fall-Related Injury  Outcome: Ongoing - Unchanged  Intervention: Promote Injury-Free Environment  Recent Flowsheet Documentation  Taken 10/26/2021 2200 by Roetta Sessions Madaleine Simmon, RN  Safety Interventions: fall reduction program maintained  Taken 10/26/2021 2000 by Roetta Sessions Trena Dunavan, RN  Safety Interventions: fall reduction program maintained     Problem: Infection  Goal: Absence of Infection Signs and Symptoms  Outcome: Ongoing - Unchanged     Problem: Impaired Wound Healing  Goal: Optimal Wound Healing  Outcome: Ongoing - Unchanged  Intervention: Promote Wound Healing  Recent Flowsheet Documentation  Taken 10/26/2021 2000 by Roetta Sessions Marlen Mollica, RN  Activity Management: activity adjusted per tolerance

## 2021-10-27 NOTE — Unmapped (Addendum)
[ ]   Consider delaying next dose of tocilizumab (last dose reported 1 week ago) given current infection  [ ]  Needs repeat 6 minute walk test as needed O2 on discharge but do not anticipate long term need  [ ]  Recommend further outpatient workup of anemia      Erik [name] is a 87 y.o. male with PMHx COPD (not on home O2), HTN, CAD status post CABG, BPH, tobacco use, hyperlipidemia, giant cell arteritis on Actemra, who presented to [facility] with COPD exacerbation 2/2 CAP and rhinovirus infection     Acute Hypoxic respiratory failure - COPD Exacerbation - Rhinovirus infection - CAP  Presented with 24 hours of worsening productive cough and dyspnea in the setting of new oxygen requirement with 2 L in the setting of suspected COPD (no formal PFTs). CXR w/ infiltrate c/w CAP and viral panel positive for rhinovirus. Treated with 5 days of prednisone, cefdinir x 5 days and azithromycin x 3 days, and discharged with spiriva, duonebs, and required home O2 (2L with exertion). Will need repeat on f/u with PCP. Discussed tobacco cessation and patient not ready to quit. Discussed not smoking and using home O2 given fire risk and patient acknowledges understanding.    Debility - pt declined home health services. Sent with outpatient PT and OT referrals     HTN  Continued home amlodipine 10 mg daily, hydralazine 25 mg daily, losartan 100 mg daily     CAD s/p CABG  Continued home atenolol 50 mg twice daily, asa, statin  Giant cell arteritis, receives infusions of Actemra, has 1 being delivered this week. Should discuss whether this infection should be delayed given current infection with rheumatologist.  BPH Continued home Flomax

## 2021-10-28 MED ORDER — PREDNISONE 20 MG TABLET
ORAL_TABLET | Freq: Every day | ORAL | 0 refills | 3 days | Status: CP
Start: 2021-10-28 — End: 2021-10-31

## 2021-10-28 MED ORDER — AZITHROMYCIN 500 MG TABLET
ORAL_TABLET | ORAL | 0 refills | 1 days | Status: CP
Start: 2021-10-28 — End: ?

## 2021-10-28 NOTE — Unmapped (Signed)
[facility] Geriatrics Discharge Summary     Identifying Information:   Erik [name]  01-Jun-1934  [id number]    PCP: Jenell Milliner, MD    Admit date: 10/26/2021     Discharge date: 10/27/2021     Discharge Attending Physician: Dr. Jarrett Soho; (P): [phone]    Discharge to: Home without Home Health (declined [facility])    Discharge Diagnoses:  Principal Problem:    COPD exacerbation (CMS-HCC)  Active Problems:    Essential hypertension, benign    Chronic back pain    CAD (coronary artery disease), autologous vein bypass graft    Tobacco abuse counseling    Tobacco use disorder    BPH (benign prostatic hyperplasia)  Resolved Problems:    * No resolved hospital problems. [facility] Course     PCP Post-Discharge Follow Up Issues:   [ ]  Consider delaying next dose of tocilizumab (last dose reported 1 week ago) given current infection  [ ]  Needs repeat 6 minute walk test as needed 2L O2 on discharge but do not anticipate long term need  [ ]  Recommend further outpatient workup of anemia   To contact the discharging attending physician with regard to the patient's hospitalization or follow-up needs, please call: [facility] Division of Geriatrics [phone]).     Erik [name]??is a 87 y.o.??male??with PMHx COPD (not on home O2), HTN, CAD status post CABG, BPH, tobacco use, hyperlipidemia, giant cell arteritis??on Actemra,??who presented to [facility] with??COPD exacerbation 2/2 CAP and rhinovirus infection  ??  Acute Hypoxic respiratory failure -??COPD Exacerbation - Rhinovirus infection - CAP  Presented with 24 hours of worsening productive cough and dyspnea in the setting of new oxygen requirement with 2 L in the setting of suspected COPD (no formal PFTs). CXR w/ infiltrate c/w CAP and viral panel positive for rhinovirus. Treated with 5 days of prednisone, cefdinir x 5 days and azithromycin x 3 days, and discharged with spiriva, duonebs, and required home O2 (2L with exertion). Will need repeat on f/u with PCP. Discussed tobacco cessation and patient not ready to quit. Discussed not smoking and using home O2 given fire risk and patient acknowledges understanding.  Oxygen walk test:  At rest on room air 92%  Walk without oxygen 90 then dropped to 86%  Back on oxygen 2L 91%   ??  Debility - pt declined home health services. Sent with outpatient PT and OT referrals  ??  HTN  Continued home amlodipine 10 mg daily, hydralazine 25 mg daily, losartan 100 mg daily  ??  CAD s/p CABG  Continued home atenolol 50 mg twice daily, asa, statin  Giant cell arteritis,??receives infusions of Actemra, has 1 being delivered this week. Should discuss whether this infection should be delayed given current infection with rheumatologist.  BPH Continued home Flomax        **GERIATRIC ASSESSMENT:     [name] is a 87 year old man admitted with COPD exacerbation complicated by a finding of a lower lobe infiltrate. ??  Erik [name] presently lives in [place], [place]. He has two adult sons as well as several grandchildren all within the area and one grand daughter who lives three blocks away. He currently lives in his own house which he used to share with an adult daughter, she unfortunately passed away a year and 1/2 ago. He does not need day to day assistance with any of his ADLS, he primarily will rely on his grandchildren to drive him  to the store for groceries, other errands or MD visits. He reports that he is able to walk all around his house and empty his garbage without getting short of breath. If he does prolonged heavy housecleaning, then he will get short of breath and need to take frequent brakes. He enjoys watching college football and is looking forward to getting home tomorrow (Saturday) so he can watch Erik [name].   ??  Erik [name] graduated high school then joined the [employer] for 4 years. 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 Shortly after that he began working in an [employer] shop in [place] and did that up until he retired.     ??  Functional Assessment                                         ADLs:  IADLs:   Feeding: Independent  Dressing: Independent  Ambulation: Independent  Toileting: Independent  Bathing: Independent  ?? Using the phone: Independent  Shopping: requires assistance   Meal preparation: Independent  Medication mgmt: Independent  Managing finances: Independent  Housework: Independent  Transportation (driving or navigating public transit): Requires Assistance   ??  Living situation: Patient lives in own home with noone (lives alone).   ??  Changes in ADLs during hospitalization:  None   ??  Assistive devices: None   ??  Additional services recommended at discharge: Home health   ??  Cognitive Assessment   ??  Delirium Assessment: CAM (Confusion Assessment Method):    On discharge, the patient did not show evidence of delirium. .    ??  Other cognitive assessment:  The patient scored 23/40/30 [facility] Mental Status Exam (SLUMS). This is suggestive of cognitive impairment and warrants further follow-up as an outpatient.   ??  Advance Care Planning   ??  Patient has decisional capacity:  Yes  ??  Patient has selected a Health Care Decision-Maker if loses capacity: Yes  ??    HCDM (patient stated preference): Erik [name], Erik [name] Son - [phone]  ????  Communication of Medical Status/Prognosis:   ??  Discussed current pulmonary condition. He is in agreement for treatment as we are doing. Discussed hospital at home, but he is not quite able to fully grasp this concept as he is more familiar with traditional hospital treatment in an acute care setting (as he is currently in), outpatient care or home health. He also admits to having difficulty and not using computers too much. He has a flip phone which he uses to communicate with his family.   ??   Treatment Decisions:   Code Status: DNR and DNI  The patient has NOT completed a living will, HCPOA or MOST form.     Procedures   None  No admission procedures for hospital encounter.    Discharge Day Services:   BP 147/77  - Pulse 65  - Temp 37.3 ??C (99.1 ??F) (Oral)  - Resp 18  - Ht 157.5 cm (5' 2)  - Wt 60.2 kg (132 lb 11.2 oz)  - SpO2 90%  - BMI 24.27 kg/m??    Last 5 Recorded Weights    10/26/21 0617   Weight: 60.2 kg (132 lb 11.2 oz)       Pt seen on the day of discharge and determined appropriate  for discharge.    Condition at Discharge: stable    Length of Discharge: I spent greater than 30 mins in the discharge of this patient.    Discharge Medication List:        Your Medication List      START taking these medications    azithromycin 500 MG tablet  Commonly known as: ZITHROMAX  Take 1 tablet (500 mg total) by mouth daily.  Start taking on: October 28, 2021     cefdinir 300 MG capsule  Commonly known as: OMNICEF  Take 1 capsule (300 mg total) by mouth every twelve (12) hours.     predniSONE 20 MG tablet  Commonly known as: DELTASONE  Take 2 tablets (40 mg total) by mouth in the morning for 3 days.  Start taking on: October 28, 2021     [facility] WITH HANDIHALER 18 mcg inhalation capsule  Generic drug: tiotropium  Place 1 capsule (18 mcg total) into inhaler and inhale daily.        CONTINUE taking these medications    ACTEMRA 162 mg/0.9 mL Syrg subcutaneous injection  Generic drug: tocilizumab  Inject the contents of one syringe (162 mg total) under the skin every fourteen (14) days.     albuterol 90 mcg/actuation inhaler  Commonly known as: PROVENTIL HFA;VENTOLIN HFA  INHALE 2 PUFFS BY MOUTH EVERY 6 HOURS AS NEEDED FOR WHEEZING     amLODIPine 10 MG tablet  Commonly known as: NORVASC  Take 1 tablet (10 mg total) by mouth daily.     ascorbic acid (vitamin C) 500 MG tablet  Commonly known as: VITAMIN C  Take 500 mg by mouth daily.     aspirin 81 MG tablet  Commonly known as: ECOTRIN  Take 81 mg by mouth daily.     atenoloL 50 MG tablet  Commonly known as: TENORMIN  Take 1 tablet (50 mg total) by mouth Two (2) times a day.     calcium carbonate 1,500 mg (600 mg elem calcium) tablet  Commonly known as: OS-CAL  Take 1 tablet by mouth Two (2) times a day.     calcium-vitamin D 500 mg-5 mcg (200 unit) per tablet  Generic drug: calcium-vitamin D  Take 1 tablet by mouth daily.     cyanocobalamin (vitamin B-12) 1000 MCG tablet  Take 1,000 mcg by mouth daily.     hydrALAZINE 25 MG tablet  Commonly known as: APRESOLINE  Take 1 tablet (25 mg total) by mouth daily.     losartan 100 MG tablet  Commonly known as: COZAAR  Take 1 tablet (100 mg total) by mouth daily.     multivitamin per tablet  Commonly known as: TAB-A-VITE/THERAGRAN  Take 1 tablet by mouth daily.     oxyCODONE-acetaminophen 5-325 mg per tablet  Commonly known as: PERCOCET  Take 1 tablet by mouth every six (6) hours as needed for pain.     simvastatin 10 MG tablet  Commonly known as: ZOCOR  Take 1 tablet (10 mg total) by mouth daily.     tamsulosin 0.4 mg capsule  Commonly known as: FLOMAX  Take 1 capsule (0.4 mg total) by mouth daily.               Pending Test Results (if blank, then none):      Most Recent Labs:  Microbiology Results (last day)     ** No results found for the last 24 hours. **  Lab Results   Component Value Date    WBC 11.7 (H) 10/27/2021    HGB 10.2 (L) 10/27/2021    HCT 29.9 (L) 10/27/2021    PLT 156 10/27/2021       Lab Results   Component Value Date    NA 132 (L) 10/27/2021    K 4.5 10/27/2021    CL 99 10/27/2021    CO2 25.7 10/27/2021    BUN 16 10/27/2021    CREATININE 0.99 10/27/2021    CALCIUM 9.3 10/27/2021    MG 1.6 10/27/2021       Lab Results   Component Value Date    ALKPHOS 42 (L) 10/26/2021    BILITOT 1.0 10/26/2021    PROT 5.9 10/26/2021    ALBUMIN 4.1 10/26/2021    ALT 16 10/26/2021    AST 27 10/26/2021       Lab Results   Component Value Date    PT 9.8 (L) 12/15/2017    INR 0.86 12/15/2017    APTT 27.1 (L) 12/15/2017     Hospital Radiology:  ECG 12 Lead    Result Date: 10/26/2021  NORMAL SINUS RHYTHM BASELINE ARTIFACT NON-SPECIFIC ST/T WAVE CHANGES ABNORMAL ECG WHEN COMPARED WITH ECG OF 15-Dec-2017 01:10, PREMATURE ATRIAL BEATS ARE NO LONGER PRESENT NONSPECIFIC T WAVE ABNORMALITY HAS REPLACED INVERTED T WAVES IN LATERAL LEADS Confirmed by Christella Noa ([id number]) on 10/26/2021 3:11:38 PM    XR Chest Portable    Result Date: 10/26/2021  EXAM: XR CHEST PORTABLE DATE: 10/26/2021 1:34 AM ACCESSION: [id number] UN DICTATED: 10/26/2021 2:21 AM INTERPRETATION LOCATION: Main Campus CLINICAL INDICATION: 87 years old Male with SHORTNESS OF BREATH  COMPARISON: Chest radiograph 04/16/2018 TECHNIQUE: Portable Chest Radiograph. FINDINGS: Hazy opacity at the left lung base. No pleural effusion or pneumothorax. Stable cardiomediastinal silhouette. Median sternotomy wires.     Hazy opacity at the left lung base, which may represent atelectasis versus infection.       Discharge Instructions:      Activity Instructions     Activity as tolerated                Other Instructions     Call MD for:  difficulty breathing, headache or visual disturbances      Call MD for:  persistent nausea or vomiting      Call MD for:  severe uncontrolled pain      Call MD for:  temperature >38.5 Celsius      Discharge instructions      Why was I admitted?   You have been admitted with shortness of breath. This is caused by your lungs not working well due to COPD as well as a viral infection (rhinovirus) and bacterial pneumonia. We treated you with antibiotics and steroids. We will start you on a new inhaler for your COPD. You needed oxygen while in the hospital--please get reevaluated by your primary care doctor at your next appointment in case you still need the oxygen. Do not smoke and use oxygen as there is a risk of starting a fire.    What should I watch for in the next few weeks?  - Worsening cough, fever, difficulty breathing, dropping oxygen levels    Medication changes:  - take azithromycin (an antibiotic) until you run out  - take cefdinir (an antibiotic) until you run out  - Take 40mg  of prednisone (a steroid) until you run out  - start using spiriva (a capsule in an inhaler) every day  - use albuterol  as needed for wheezing      Who will I follow up with?  Your primary care doctor in 1-2 weeks. Please ask your doctor when you should next take your injection for your giant cell arteritis given your recent infection.    It is important to discuss healthcare issues with your primary care doctor at an upcoming visit, so that your doctor knows what is important to you and what you would or would not want done if you were to become very ill    What should I do if I start to feel sick again?  If the shortness of breath worsens, we would recommend that you use your albuterol.    In general, if you have specific questions about these instructions or your hospital stay in the next 48 hours, please call [phone] and ask for the geriatrics doctor on call. Let them know that you were just discharged from the hospital.     For new symptoms or problems, call your primary care provider.     If you believe that this is a life-threatening emergency, call 911 or go to the emergency room.       Thank you for allowing [place] to participate in your care.          Follow Up instructions and Outpatient Referrals     Ambulatory referral to Occupational Therapy      Suggest Treatment: Evaluation with suggestions for treatment    Procedures: Strengthening    Ambulatory referral to Physical Therapy      Suggest Treatment: Evaluation with suggestions for treatment    Call MD for:  difficulty breathing, headache or visual disturbances      Call MD for:  persistent nausea or vomiting      Call MD for:  severe uncontrolled pain      Call MD for:  temperature >38.5 Celsius      Discharge instructions          Appointments which have been scheduled for you    Oct 30, 2021 12:00 PM  (Arrive by 11:45 AM)  RETURN TELEPHONE with Rockwell Alexandria, FNP  [facility] [place] [facility]) [address]  [place] [address]  [phone]   You will receive a phone call from your provider at the time of your visit.    If this is an emergency, call 911 or go to the nearest urgent care or emergency room.          Dec 11, 2021 10:40 AM  (Arrive by 10:25 AM)  MEDICARE ANNUAL WELLNESS with Jenell Milliner, MD  [facility] at [facility]) [address]  [place] [address]  [phone]

## 2021-10-29 MED ORDER — OXYCODONE-ACETAMINOPHEN 5 MG-325 MG TABLET
ORAL_TABLET | Freq: Four times a day (QID) | ORAL | 0 refills | 30.00000 days | Status: CP | PRN
Start: 2021-10-29 — End: ?

## 2021-10-29 NOTE — Unmapped (Signed)
[facility] Telephone Visit Note    Assessment/Recommendations:   87 y.o. 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     Currently taking tocilizumab 162 mg SQ q other week and tolerating without any side effects. Pt was recently hospitalized for COPD exacerbation, symptoms improving today and will complete ABX Wednesday, no fever since discharge. Scheduled to take Actemra Thursday. Discussed delaying this injection but pt resistant. Symptoms from [facility] standpoint are overall stable. Continues to have L eye vision loss which has been present since 2018. No new vision changes, headaches, temporal artery tenderness, scalp tenderness, jaw pain/claudication, or shoulder/hip stiffness.    - Continue tocilizumab 162 mg subcutaneous q2weeks.   - Update lab monitoring: CBCw/diff, Crt, AST, ALT, ESR, CRP. Pt will complete next month at PCP visit.   -  Of note, patient with known h/o GIB/duodenal ulcer which does increase risk of GI perforation on tocilizumab - however, he is now >18 months out from this without any complications and wishes to continue treatment with tocilizumab  - Continue F/U with PCP regarding COPD  - Discussed with pt that we haven't seen him in person since 2019 so his next visit must be in person to allow for full evaluation. Pt verbalized understanding.       F/U in 4-6 months with Dr. Ilsa Iha-- in person        The patient reports they are currently: at home. I spent 16 minutes on the phone visit with the patient on the date of service. I spent an additional 5 minutes on pre- and post-visit activities on the date of service.     The patient was not located and I was located within 250 yards of a hospital based location during the phone visit. The patient was physically located in [place] or a state in which I am permitted to provide care. The patient and/or parent/guardian understood that s/he may incur co-pays and cost sharing, and agreed to the telemedicine visit. The visit was reasonable and appropriate under the circumstances given the patient's presentation at the time.    The patient and/or parent/guardian has been advised of the potential risks and limitations of this mode of treatment (including, but not limited to, the absence of in-person examination) and has agreed to be treated using telemedicine. The patient's/patient's family's questions regarding telemedicine have been answered.    If the visit was completed in an ambulatory setting, the patient and/or parent/guardian has also been advised to contact their provider???s office for worsening conditions, and seek emergency medical treatment and/or call 911 if the patient deems either necessary.          ___________________________________________________________________    Primary Care Provider: Jenell Milliner, MD    Reason for Visit: F/U [facility]     Identification: Pt self identified using name and date of birth    Patient location: [place]    HPI:  Erik [name] is an 87 y.o. smoker with h/o COPD, CAD s/p CABG,  HTN,  ongoing alcohol use and GIB 2/2 duodenal ulcer (likely 2/2 concurrent ibuprofen, alcohol and prednisone use) presenting for follow-up of biopsy proven giant cell arteritis complicated by ischemic optic neuropathy/ vision loss in  left eye.     Last seen 02/27/21 as telemedicine visit with myself.     He is taking Actemra 162 mg SQ weekly (takes every Thursday).     Interim History:   Pt presents for f/u via phone today. He recently was in the hospital for COPD exacerbation. He reports his symptoms are improving and has one day left in his antibiotics. He denies recent fever. He is using oxygen at home. He has been in close contact with his PCP who is aware of recent hospitalization. He has a f/u scheduled next month.     He is taking Actemra every other week and is due to have this Thursday. He denies recent headache or changes in vision. No jaw claudication.     He is unsure if he has lost weight but possibly some with being sick recently. He denies hip or shoulder stiffness.     Review of Systems:   All other systems were reviewed and were negative    Medical History:  Past Medical History:   Diagnosis Date   ??? Cataract 2005    cataract surgery   ??? COPD (chronic obstructive pulmonary disease) (CMS-HCC) 10/07/2016   ??? DJD (degenerative joint disease), lumbar    ??? Hypertension    ??? Tobacco abuse 02/15/2014       Allergies:  Patient has no known allergies.    Medications:     Current Outpatient Medications:   ???  albuterol HFA 90 mcg/actuation inhaler, INHALE 2 PUFFS BY MOUTH EVERY 6 HOURS AS NEEDED FOR WHEEZING, Disp: 9 g, Rfl: 0  ???  amLODIPine (NORVASC) 10 MG tablet, Take 1 tablet (10 mg total) by mouth daily., Disp: 90 tablet, Rfl: 3  ???  ascorbic acid (VITAMIN C) 500 MG tablet, Take 500 mg by mouth daily., Disp: , Rfl:   ???  aspirin (ECOTRIN) 81 MG tablet, Take 81 mg by mouth daily., Disp: , Rfl:   ???  atenoloL (TENORMIN) 50 MG tablet, Take 1 tablet (50 mg total) by mouth Two (2) times a day., Disp: 180 tablet, Rfl: 3  ???  azithromycin (ZITHROMAX) 500 MG tablet, Take 1 tablet (500 mg total) by mouth daily., Disp: 1 tablet, Rfl: 0  ???  calcium carbonate (OS-CAL) 1,500 mg (600 mg elem calcium) tablet, Take 1 tablet by mouth Two (2) times a day., Disp: , Rfl:   ???  calcium-vitamin D (CALCIUM-VITAMIN D) 500 mg-5 mcg (200 unit) per tablet, Take 1 tablet by mouth daily., Disp: , Rfl:   ???  cefdinir (OMNICEF) 300 MG capsule, Take 1 capsule (300 mg total) by mouth every twelve (12) hours., Disp: 6 capsule, Rfl: 0  ???  cyanocobalamin, vitamin B-12, 1000 MCG tablet, Take 1,000 mcg by mouth daily., Disp: , Rfl:   ???  hydrALAZINE (APRESOLINE) 25 MG tablet, Take 1 tablet (25 mg total) by mouth daily., Disp: 90 tablet, Rfl: 3  ???  losartan (COZAAR) 100 MG tablet, Take 1 tablet (100 mg total) by mouth daily., Disp: 90 tablet, Rfl: 3  ???  multivitamin (TAB-A-VITE/THERAGRAN) per tablet, Take 1 tablet by mouth daily., Disp: , Rfl:   ???  oxyCODONE-acetaminophen (PERCOCET) 5-325 mg per tablet, Take 1 tablet by mouth every six (6) hours as needed for pain., Disp: 120 tablet, Rfl: 0  ???  predniSONE (DELTASONE) 20 MG tablet, Take 2 tablets (40 mg total) by mouth in the morning for 3 days., Disp: 6 tablet, Rfl: 0  ???  simvastatin (ZOCOR) 10 MG tablet, Take  1 tablet (10 mg total) by mouth daily., Disp: 90 tablet, Rfl: 3  ???  tamsulosin (FLOMAX) 0.4 mg capsule, Take 1 capsule (0.4 mg total) by mouth daily., Disp: 90 capsule, Rfl: 3  ???  tiotropium (SPIRIVA WITH HANDIHALER) 18 mcg inhalation capsule, Place 1 capsule (18 mcg total) into inhaler and inhale daily., Disp: 30 capsule, Rfl: 2  ???  tocilizumab (ACTEMRA) 162 mg/0.9 mL Syrg subcutaneous injection, Inject the contents of one syringe (162 mg total) under the skin every fourteen (14) days., Disp: 6 mL, Rfl: 3  ???  triamcinolone (KENALOG) 0.1 % cream, Apply topically Two (2) times a day., Disp: 80 g, Rfl: 1    Surgical History:  Past Surgical History:   Procedure Laterality Date   ??? CARDIAC VALVE REPLACEMENT      triple bypass   ??? CATARACT EXTRACTION     ??? CORONARY ARTERY BYPASS GRAFT  2001    x3   ??? EYE SURGERY  2005    cataracts   ??? PR REPAIR ING HERNIA,5+Y/O,REDUCIBL Left 06/19/2015    Procedure: REPAIR INITIAL INGUINAL HERNIA, AGE 65 YEARS OR OLDER; REDUCIBLE;  Surgeon: Shelda Jakes, MD;  [facility];  Service: Trauma   ??? PR TEMPORAL ARTERY LIGATN OR BX Left 10/23/2017    Procedure: LIGATION OR BIOPSY, TEMPORAL ARTERY;  Surgeon: Vivien Rossetti, MD;  Location: [facility];  Service: ENT   ??? PR UPPER GI ENDOSCOPY,DIAGNOSIS N/A 12/17/2017    Procedure: UGI ENDO, INCLUDE ESOPHAGUS, STOMACH, & DUODENUM &/OR JEJUNUM; DX W/WO COLLECTION SPECIMN, BY BRUSH OR WASH;  Surgeon: Bluford Kaufmann, MD;  [facility];  Service: Gastroenterology       Social History:  Lives in [place], [place]  Social History     Tobacco Use   ??? Smoking status: Current Every Day Smoker     Packs/day: 1.00     Years: 4.00     Pack years: 4.00     Types: Cigarettes   ??? Smokeless tobacco: Never Used   ??? Tobacco comment: quit for 25 years, restarted in 2014   Vaping Use   ??? Vaping Use: Never used   Substance Use Topics   ??? Alcohol use: Yes     Alcohol/week: 2.0 standard drinks     Types: 2 Cans of beer per week     Comment: daily   ??? Drug use: No       Family History:  Family History   Problem Relation Age of Onset   ??? Thyroid disease Sister    ??? Cancer Brother    ??? Glaucoma Neg Hx    ??? Macular degeneration Neg Hx    ??? Retinal detachment Neg Hx    ??? Substance Abuse Disorder Neg Hx    ??? Mental illness Neg Hx        Physical Exam:  General:  Alert and interactive; does not sound in distress  Pulmonary:  Speaking in full sentences: no wheezing  Psych: Mood and affect appropriate and congruent       Test Results    Final Diagnosis   A: Temporal artery, left, biopsy   - Segment of temporal artery, positive for active temporal arteritis.

## 2021-10-29 NOTE — Unmapped (Signed)
Police report reference # [id number]  [employer] [place] Department- Officer Orson Slick   [phone]    Patient states that while he was in the hospital, a woman picked up his pain medication and there is an open case with the local police department and an arrest warrant has been issued. The family is needing to receive advice as to how to proceed with another refill on his Percocet.     I have contacted [place] [employer], I spoke with Bonita Quin to verify the case is open and active, currently ongoing.     I have queued the order for approval.

## 2021-10-30 ENCOUNTER — Institutional Professional Consult (permissible substitution): Admit: 2021-10-30 | Discharge: 2021-10-31 | Payer: MEDICARE | Attending: Family | Primary: Family

## 2021-10-30 MED ORDER — TRIAMCINOLONE ACETONIDE 0.1 % TOPICAL CREAM
Freq: Two times a day (BID) | TOPICAL | 1 refills | 0 days | Status: CP
Start: 2021-10-30 — End: 2022-10-30

## 2021-10-30 NOTE — Unmapped (Signed)
Patient is requesting the following refill  Requested Prescriptions      No prescriptions requested or ordered in this encounter       Recent Visits  No visits were found meeting these conditions.  Showing recent visits within past 365 days with a meds authorizing provider and meeting all other requirements  Future Appointments  Date Type Provider Dept   12/11/21 Appointment Jenell Milliner, MD [facility]   12/28/21 Appointment Deneise Lever, FNP [place] [facility]   Showing future appointments within next 365 days with a meds authorizing provider and meeting all other requirements       Labs: Not applicable this refill     Patient is requesting a refill on a cream he had previously due to a rash he has noticed since he was discharged from the hospital. I have queued the medication for approval.

## 2021-11-22 NOTE — Unmapped (Signed)
Patient is requesting the following refill  Requested Prescriptions      No prescriptions requested or ordered in this encounter       Recent Visits  No visits were found meeting these conditions.  Showing recent visits within past 365 days with a meds authorizing provider and meeting all other requirements  Future Appointments  Date Type Provider Dept   12/11/21 Appointment Jenell Milliner, MD [place] [facility]   12/28/21 Appointment Deneise Lever, FNP [place] [facility]   Showing future appointments within next 365 days with a meds authorizing provider and meeting all other requirements       Labs: Not applicable this refill

## 2021-11-26 MED ORDER — FUROSEMIDE 20 MG TABLET
ORAL_TABLET | 1 refills | 0 days | Status: CP
Start: 2021-11-26 — End: ?

## 2021-11-27 MED ORDER — OXYCODONE-ACETAMINOPHEN 5 MG-325 MG TABLET
ORAL_TABLET | Freq: Four times a day (QID) | ORAL | 0 refills | 30.00000 days | Status: CP | PRN
Start: 2021-11-27 — End: ?

## 2021-11-27 NOTE — Unmapped (Signed)
Patient notified

## 2021-11-27 NOTE — Unmapped (Signed)
Patient called stating that he is having foot/leg swelling that is also in his hands/fingers. Patient admits to having elevated blood pressure readings this am 157/65. Patient also states that he is having symptoms of slight leg pain and jumping but does not know if it could be due to the weather. Patient is wondering if fluid pills would be an option. Please advise.     Patient states he took someone else's fluid pills for a day or two and was urinating frequently and the swelling improved but has not been prescribed this medication.

## 2021-11-27 NOTE — Unmapped (Signed)
Vernona Rieger from [facility] called and stated that the oxycodone that the patient is on is currently on back order and needs the script [facility] where it has been verified that it is in stock. [facility] is cancelling the prescription on that end. I have queued medication for approval.

## 2021-12-05 NOTE — Unmapped (Unsigned)
Medicare Annual Wellness Visit    Risks identified  {AWV Risks Identified/Plan:29166::There were no significant safety risks identified at today's visit.}  Treatment options and their associated risks/benefits were reviewed with the patient.    End of Life Care Planning  Advance Directives-in place, patient is a DNR and DNI    Personalized Prevention Plan  During the course of the visit the patient was educated and counseled about appropriate screening and preventive services.  A personalized prevention plan was reviewed with the patient and a written copy was provided for personal records (available for review in Patient Instructions).  The following screening tests were ordered today:    Health Maintenance-  PSA screening-defer as per guidelines  CRS/Colonoscopy-defer as per guidelines  Tdap vaccine-  iinfluenza vaccine-  Pneumovax-2011  Prevnar-2016  ShingRix-recommended and patient to receive at local pharmacy  Eye exam-  Covid 19 vaccine-Moderna series completed 05/2020      PMH/HPI  Patient and I had a telemedicine encounter in August/2022 for the following medical conditions:    -BPH:  History of slow urinary flow- pt started on Flomax 0.4 mg at bedtime.  His symptoms remain stable.  He denies retention symptoms.     - chronic low back pain in the setting of lumbar DDD: ??He insisted on keeping his percocet Rx at 120 tabs a month. Review of PDMP reflects compliance. ??Annual UDS done in 03/2021, confirmed presence of opioid and he has renewed his annual controlled substance contract this year.   Review of pt's PDMP reflects med compliance.       - COPD history: pt continue to smoke occasionally. ??He has no chronic SOB symptoms. ????Most inhalers prescribed have been to costly for the pt. ??He continues on albuterol inhaler prn/rescue purposes. He is advised on not smoking cigarettes. He uses the rescue inhaler about twice a day. ????He wishes to continue with this regimen since other inhalers attempted in the past are very expensive. Inhaler education has been performed in the past.     - history of Giant Cell arteritis: he will continue follow up with his rheumatologist as scheduled along with medication regimen as directed.??He self administers every 85 weeks Actemra injections and he had a rheumatology follow up visit in 02/2021. He has a history of ischemic optic neuropathy and he was last seen by [facility] in 2019. Pt states he has his routine eye exam at [facility] optometry and is reminded to schedule an appt soon. Last rheumatology encounter from 02/2021, details are as follows:   Assessment/Recommendations:   87 y.o. smoker with h/o COPD, CAD s/p CABG,  HTN, active alcohol use, GIB 2/2 duodenal ulcer (likely 2/2 concurrent ibuprofen, alcohol and prednisone use) presenting for follow-up of  biopsy proven giant cell arteritis  complicated by ischemic optic neuropathy/ vision loss in left eye (dx 11/2017)  He was last seen 02/22/20 as telemedicine visit with Dr. Ilsa Iha  Currently taking tocilizumab 162 mg SQ q other week and tolerating without any side effects. Reports persistent vision loss of left eye. No new vision changes, headaches, temporal artery tenderness, scalp tenderness, jaw pain/claudication, shoulder/hip stiffness, fevers, night sweat, weight loss, excessive fatigue of concern.   - Continue tocilizumab 162 mg subcutaneous q2weeks. Refill sent in 02/23/21 by Dr. Ilsa Iha  - Update lab monitoring: CBCw/diff, Crt, AST, ALT, ESR, CRP. Pt agrees to complete at PCP. Orders placed.   -  Of note, patient with known h/o GIB/duodenal ulcer which does increase risk of GI perforation on tocilizumab -  however, he is now >18 months out from this without any complications and wishes to continue treatment with tocilizumab  - Encouraged pt to receive third COVID-19 vaccine locally ASAP. Discussed option of Evusheld 2 weeks post vaccine. Will mail Evusheld pt fact sheet to pt to review. Asked pt to notify clinic if he'd like to receive 2 weeks post vaccine.   F/U in 6-8 months with Dr. Ilsa Iha         - HTN/CAD/Dyslipidemia history: pt remains normotensive and chest pain free on current medication regimen. ??He had ??normal electrolytes, creatinine and LFT's in 08/2019.????Lipid panel done in 03/2021 revealed HDL/LDL of 45/55. His home BP readings are always normal.  CMP result was normal in 03/2021 revealed creatinine level of 1.12 with normal LFT's and potassium level. His home BP readings remain normal.        - macrocytosis noted on CBC in 08/2019: this had been noted in the past. Pt states he was a heavy drinker in the past but more recently reduced his daily alcohol consumption to 3 beers a day.  Repeat CBC done in 03/2021 revealed H/H of 12/34 with mildly elevated MCV at 96.7. he has had normal  normal ferritin, iron, folate and vitamin B12 levels in the past.           Subjective:     HPI  Reason for telephone encounter: routine follow up visit and medication refill request.   We had a telemedicine encounter in 03/2021 for the following medical conditions:    -BPH:  History of slow urinary flow- pt started on Flomax 0.4 mg at bedtime.  His symptoms remain stable.     - chronic low back pain in the setting of lumbar DDD: ??He insisted on keeping his percocet Rx at 120 tabs a month. Review of PDMP reflects compliance. ??Annual UDS done in 03/2021, confirmed presence of opioid and he is was reminded on our last telephone visit that he needed an in person visit to renew his  annual controlled substance contract .  Review of pt's PDMP reflects med compliance.   Pt advised that he will need to return for nurse visit for renewal and completion of controlled substance contract. Form left at the front desk.     - COPD history: pt continue to smoke occasionally. ??He has no chronic SOB symptoms. ????Most inhalers prescribed have been to costly for the pt. ??He continues on albuterol inhaler prn/rescue purposes. He is advised on not smoking cigarettes. He uses the rescue inhaler about twice a day. ????He wishes to continue with this regimen since other inhalers attempted in the past are very expensive. Inhaler education has been performed in the past.  He has recent sinus infection last week and he reports his symptoms are much improved this week.    - history of Giant Cell arteritis: he will continue follow up with his rheumatologist as scheduled along with medication regimen as directed.??He self administers every 85 weeks Actemra injections and he had a rheumatology follow up visit in 02/2021. He has a history of ischemic optic neuropathy and he was last seen by [facility] in 2019. Pt states he has his routine eye exam at [facility] optometry and is reminded to schedule an appt soon. Last rheumatology encounter from 10/2021, details are as follows:   Assessment/Recommendations:   87 y.o. smoker with h/o COPD, CAD s/p CABG,  HTN, active alcohol use, GIB 2/2 duodenal ulcer (likely 2/2 concurrent ibuprofen, alcohol and prednisone use) presenting for  follow-up of  biopsy proven giant cell arteritis  complicated by ischemic optic neuropathy/ vision loss in left eye (dx 11/2017)  He was last seen 02/27/21 as telemedicine visit with myself.   Currently taking tocilizumab 162 mg SQ q other week and tolerating without any side effects. Pt was recently hospitalized for COPD exacerbation, symptoms improving today and will complete ABX Wednesday, no fever since discharge. Scheduled to take Actemra Thursday. Discussed delaying this injection but pt resistant. Symptoms from GCA standpoint are overall stable. Continues to have L eye vision loss which has been present since 2018. No new vision changes, headaches, temporal artery tenderness, scalp tenderness, jaw pain/claudication, or shoulder/hip stiffness.  - Continue tocilizumab 162 mg subcutaneous q2weeks.   - Update lab monitoring: CBCw/diff, Crt, AST, ALT, ESR, CRP. Pt will complete next month at PCP visit.   -  Of note, patient with known h/o GIB/duodenal ulcer which does increase risk of GI perforation on tocilizumab - however, he is now >18 months out from this without any complications and wishes to continue treatment with tocilizumab  - Continue F/U with PCP regarding COPD  - Discussed with pt that we haven't seen him in person since 2019 so his next visit must be in person to allow for full evaluation. Pt verbalized understanding.   F/U in 4-6 months with Dr. Ilsa Iha-- in person   The patient has future lab order placed by rheumatologist as noted above and this blood draw can be done today.     - HTN/CAD/Dyslipidemia history: pt remains normotensive and chest pain free on current medication regimen. ??He had ??normal electrolytes, creatinine and LFT's in 08/2019.????Lipid panel done in 03/2021 revealed HDL/LDL of 45/55. His home BP readings are always normal.  CMP result was normal in 03/2021 revealed creatinine level of 1.12 with normal LFT's and potassium level. His home BP readings remain normal.        - macrocytosis noted on CBC in 08/2019: this had been noted in the past. Pt states he was a heavy drinker in the past but more recently reduced his daily alcohol consumption to 3 beers a day.  Repeat CBC done in 03/2021 revealed H/H of 12/34 with mildly elevated MCV at 96.7. he has had normal  normal ferritin, iron, folate and vitamin B12 levels in the past.                No orders of the defined types were placed in this encounter.      Subjective:     Erik [name] is a 87 y.o. male who presents for a Medicare Wellness Visit.      Medicare eligibility date:  ***  Type of visit:  {Type of Medicare Wellness Visit:28849}    Health Risk Assessment:  The patient's Health Risk Assessment forms were completed/reviewed.      Current Opioid use:yes  Risk of Opioid medication reviewed:{yes:28281}  Pain control on current Opioid:{yes:28281}  Other pain medication options discussed:{yes:28281}  Follow up with Pain management if applicable:{yes:28281}    Comprehensive Medical History  Patient Active Problem List   Diagnosis   ??? Essential hypertension, benign   ??? Chronic back pain   ??? CAD (coronary artery disease), autologous vein bypass graft   ??? Right carotid bruit   ??? Tobacco abuse counseling   ??? Tobacco use disorder   ??? Chronic pain syndrome   ??? COPD exacerbation (CMS-HCC)   ??? Risk for falls   ??? Vision, loss, sudden, left   ???  Giant cell arteritis (CMS-HCC)   ??? Duodenitis   ??? Melena   ??? Constipation   ??? DDD (degenerative disc disease), lumbar   ??? Dyslipidemia   ??? Macrocytosis without anemia   ??? BPH (benign prostatic hyperplasia)     Past Medical History:   Diagnosis Date   ??? Cataract 2005    cataract surgery   ??? COPD (chronic obstructive pulmonary disease) (CMS-HCC) 10/07/2016   ??? DJD (degenerative joint disease), lumbar    ??? Hypertension    ??? Tobacco abuse 02/15/2014     Past Surgical History:   Procedure Laterality Date   ??? CARDIAC VALVE REPLACEMENT      triple bypass   ??? CATARACT EXTRACTION     ??? CORONARY ARTERY BYPASS GRAFT  2001    x3   ??? EYE SURGERY  2005    cataracts   ??? PR REPAIR ING HERNIA,5+Y/O,REDUCIBL Left 06/19/2015    Procedure: REPAIR INITIAL INGUINAL HERNIA, AGE 18 YEARS OR OLDER; REDUCIBLE;  Surgeon: Shelda Jakes, MD;  Location: [facility];  Service: Trauma   ??? PR TEMPORAL ARTERY LIGATN OR BX Left 10/23/2017    Procedure: LIGATION OR BIOPSY, TEMPORAL ARTERY;  Surgeon: Vivien Rossetti, MD;  Location[facility];  Service: ENT   ??? PR UPPER GI ENDOSCOPY,DIAGNOSIS N/A 12/17/2017    Procedure: UGI ENDO, INCLUDE ESOPHAGUS, STOMACH, & DUODENUM &/OR JEJUNUM; DX W/WO COLLECTION SPECIMN, BY BRUSH OR WASH;  Surgeon: Bluford Kaufmann, MD;  [facility];  Service: Gastroenterology     Family History   Problem Relation Age of Onset   ??? Thyroid disease Sister    ??? Cancer Brother    ??? Glaucoma Neg Hx    ??? Macular degeneration Neg Hx    ??? Retinal detachment Neg Hx    ??? Substance Abuse Disorder Neg Hx ??? Mental illness Neg Hx      No Known Allergies  Current Outpatient Medications   Medication Sig Dispense Refill   ??? albuterol HFA 90 mcg/actuation inhaler INHALE 2 PUFFS BY MOUTH EVERY 6 HOURS AS NEEDED FOR WHEEZING 9 g 0   ??? amLODIPine (NORVASC) 10 MG tablet Take 1 tablet (10 mg total) by mouth daily. 90 tablet 3   ??? ascorbic acid (VITAMIN C) 500 MG tablet Take 500 mg by mouth daily.     ??? aspirin (ECOTRIN) 81 MG tablet Take 81 mg by mouth daily.     ??? atenoloL (TENORMIN) 50 MG tablet Take 1 tablet (50 mg total) by mouth Two (2) times a day. 180 tablet 3   ??? azithromycin (ZITHROMAX) 500 MG tablet Take 1 tablet (500 mg total) by mouth daily. 1 tablet 0   ??? calcium carbonate (OS-CAL) 1,500 mg (600 mg elem calcium) tablet Take 1 tablet by mouth Two (2) times a day.     ??? calcium-vitamin D (CALCIUM-VITAMIN D) 500 mg-5 mcg (200 unit) per tablet Take 1 tablet by mouth daily.     ??? cefdinir (OMNICEF) 300 MG capsule Take 1 capsule (300 mg total) by mouth every twelve (12) hours. 6 capsule 0   ??? cyanocobalamin, vitamin B-12, 1000 MCG tablet Take 1,000 mcg by mouth daily.     ??? furosemide (LASIX) 20 MG tablet 1/2 to 1 tab daily prn edema 30 tablet 1   ??? hydrALAZINE (APRESOLINE) 25 MG tablet Take 1 tablet (25 mg total) by mouth daily. 90 tablet 3   ??? losartan (COZAAR) 100 MG tablet Take 1  tablet (100 mg total) by mouth daily. 90 tablet 3   ??? multivitamin (TAB-A-VITE/THERAGRAN) per tablet Take 1 tablet by mouth daily.     ??? oxyCODONE-acetaminophen (PERCOCET) 5-325 mg per tablet Take 1 tablet by mouth every six (6) hours as needed for pain. 120 tablet 0   ??? simvastatin (ZOCOR) 10 MG tablet Take 1 tablet (10 mg total) by mouth daily. 90 tablet 3   ??? tamsulosin (FLOMAX) 0.4 mg capsule Take 1 capsule (0.4 mg total) by mouth daily. 90 capsule 3   ??? tiotropium (SPIRIVA WITH HANDIHALER) 18 mcg inhalation capsule Place 1 capsule (18 mcg total) into inhaler and inhale daily. 30 capsule 2   ??? tocilizumab (ACTEMRA) 162 mg/0.9 mL Syrg subcutaneous injection Inject the contents of one syringe (162 mg total) under the skin every fourteen (14) days. 6 mL 3   ??? triamcinolone (KENALOG) 0.1 % cream Apply topically Two (2) times a day. 80 g 1     No current facility-administered medications for this visit.       Hospitalizations:  {None:28564::None}    Current Providers:   Patient Care Team:  Jenell Milliner, MD as PCP - General (Family Medicine)  Jenell Milliner, MD as PCP - General-ATTRIBUTED    Other Specialists, Providers, Medical Suppliers:  {AWV Specialists, Suppliers:28850:a}    Social History:   Occupation:  ***   Marital Status:  {MARITAL STATUS:25425:a}   Lives with:  {lives UJWJ:191478}   Diet:  {AWV Diet Options:28851}   Physical Activity:  {AWV Activity GNFAO:13086}  Social History     Socioeconomic History   ??? Marital status: Widowed   Tobacco Use   ??? Smoking status: Every Day     Packs/day: 1.00     Years: 4.00     Pack years: 4.00     Types: Cigarettes   ??? Smokeless tobacco: Never   ??? Tobacco comments:     quit for 25 years, restarted in 2014   Vaping Use   ??? Vaping Use: Never used   Substance and Sexual Activity   ??? Alcohol use: Yes     Alcohol/week: 2.0 standard drinks     Types: 2 Cans of beer per week     Comment: daily   ??? Drug use: No   Other Topics Concern   ??? Do you use sunscreen? No   ??? Tanning bed use? No   ??? Are you easily burned? No   ??? Excessive sun exposure? No   ??? Blistering sunburns? Yes     Comment: Past   Social History Narrative    Lives in [place], wife passed 7 years ago. His daughter lives with him. Granddaughter, Victorino Dike, manages his finances.      Social Determinants of Health     Financial Resource Strain: Low Risk    ??? Difficulty of Paying Living Expenses: Not hard at all   Food Insecurity: No Food Insecurity   ??? Worried About Programme researcher, broadcasting/film/video in the Last Year: Never true   ??? Ran Out of Food in the Last Year: Never true   Transportation Needs: No Transportation Needs   ??? Lack of Transportation (Medical): No ??? Lack of Transportation (Non-Medical): No       Preventive Care:  Health Maintenance   Topic Date Due   ??? COPD Spirometry  Never done   ??? DTaP/Tdap/Td Vaccines (1 - Tdap) Never done   ??? Zoster Vaccines (1 of 2) Never done   ??? COVID-19 Vaccine (3 -  Moderna risk series) 06/20/2020   ??? Influenza Vaccine (1) 08/23/2021   ??? Serum Creatinine Monitoring  10/27/2022   ??? Potassium Monitoring  10/27/2022   ??? Pneumococcal Vaccine 65+  Completed     Immunization History   Administered Date(s) Administered   ??? COVID-19 VACCINE,MRNA(MODERNA)(PF) 04/25/2020, 05/23/2020   ??? Influenza Vaccine Quad (IIV4 PF) 38mo+ injectable 01/15/2017, 11/05/2018, 09/16/2019   ??? Influenza Virus Vaccine, unspecified formulation 08/23/2014   ??? PNEUMOCOCCAL POLYSACCHARIDE 23 12/23/2009   ??? Pneumococcal Conjugate 13-Valent 04/06/2015       Depression Screen:  1.  Over the past two weeks, have you felt down, depressed or hopeless?  {No/Yes:28706:a:No}  2.  Over the past two weeks, have you felt little interest or pleasure in doing things?  {No/Yes:28706:a:No}    Safety Screen:  1.  Do you need help with the phone, transportation, shopping, preparing meals, housework, laundry, medications, or managing money?  {No/Yes:28706:a:No}  2.  Does your home have rugs in the hallway, lack grab bars in the bathroom, lack handrails on the stairs, or have poor lighting?  {No/Yes:28706:a:No}       Objective:     There were no vitals taken for this visit.  There is no height or weight on file to calculate BMI.    Functional Ability:  Mobility Test : normal get up and go testing  Hearing Assessment: normal to finger rub testing  Memory Assessment: normal clock drawing with time and able to recall 3 words      Physical Exam:  Neck exam- no masses  Lung exam- clear to auscultation  Cardiac exam- RRR nl s1 and s2  abd exam- no HSM, non tender, no masses  Ext exam- no edema    Assessment and Plan-  AWV  Screening labs-as per rheumatology lab order the patient will have creatinine, CBC ALT/AST, CRP and ESR drawn today.  He has had CMP and lipid panel done in the last 6 months.  Referrals-  Vaccines-  Follow up-

## 2021-12-25 NOTE — Unmapped (Signed)
Patient is requesting the following refill  Requested Prescriptions     Pending Prescriptions Disp Refills   ??? oxyCODONE-acetaminophen (PERCOCET) 5-325 mg per tablet 120 tablet 0     Sig: Take 1 tablet by mouth every six (6) hours as needed for pain.       Last refill given on:11/27/21 with 120 count and 0 refills.     Recent Visits  No visits were found meeting these conditions.  Showing recent visits within past 365 days with a meds authorizing provider and meeting all other requirements  Future Appointments  Date Type Provider Dept   12/28/21 Appointment Deneise Lever, FNP [place] [facility]   01/15/22 Appointment Jenell Milliner, MD [place] [facility]   Showing future appointments within next 365 days with a meds authorizing provider and meeting all other requirements       Opioid Monitoring   Urine Tox Screen Last Drug Screen Date: 04/20/2021  Opiate Confirmation Test Last Drug Screen Date: 10/21/2019  Last Opioid Dispensed Provider: Jenell Milliner, MD  Prescribed MEDD : 30  Last PDMP Review: 09/24/2021 11:25 AM  12/25/21  Last Opioid Pain Agreement Signed Date: Not Found  Last Non Opioid Controlled Substance Pain Agreement Signed Date: Not Found    Naloxone Ordered: Not Found  Last OV: Visit date not found

## 2021-12-27 DIAGNOSIS — J449 Chronic obstructive pulmonary disease, unspecified: Principal | ICD-10-CM

## 2021-12-27 MED ORDER — ALBUTEROL SULFATE HFA 90 MCG/ACTUATION AEROSOL INHALER
Freq: Four times a day (QID) | RESPIRATORY_TRACT | 3 refills | 0 days | Status: CP | PRN
Start: 2021-12-27 — End: 2022-12-27

## 2021-12-27 MED ORDER — OXYCODONE-ACETAMINOPHEN 5 MG-325 MG TABLET
ORAL_TABLET | Freq: Four times a day (QID) | ORAL | 0 refills | 30 days | Status: CP | PRN
Start: 2021-12-27 — End: ?

## 2021-12-27 NOTE — Unmapped (Signed)
Patient is requesting the following refill  Requested Prescriptions     Pending Prescriptions Disp Refills   ??? albuterol HFA 90 mcg/actuation inhaler [Pharmacy Med Name: Albuterol Sulfate HFA 108 (90 Base) MCG/ACT Inhalation Aerosol Solution] 9 g 0     Sig: INHALE 2 PUFFS BY MOUTH EVERY 6 HOURS AS NEEDED FOR WHEEZING       Recent Visits  No visits were found meeting these conditions.  Showing recent visits within past 365 days with a meds authorizing provider and meeting all other requirements  Future Appointments  Date Type Provider Dept   12/28/21 Appointment Deneise Lever, FNP [place] [facility]   01/15/22 Appointment Jenell Milliner, MD [place] [facility]   Showing future appointments within next 365 days with a meds authorizing provider and meeting all other requirements

## 2021-12-28 ENCOUNTER — Ambulatory Visit: Admit: 2021-12-28 | Discharge: 2021-12-29 | Payer: MEDICARE

## 2021-12-28 DIAGNOSIS — M316 Other giant cell arteritis: Principal | ICD-10-CM

## 2021-12-28 DIAGNOSIS — D7589 Other specified diseases of blood and blood-forming organs: Principal | ICD-10-CM

## 2021-12-28 DIAGNOSIS — I10 Essential (primary) hypertension: Principal | ICD-10-CM

## 2021-12-28 DIAGNOSIS — F17200 Nicotine dependence, unspecified, uncomplicated: Principal | ICD-10-CM

## 2021-12-28 DIAGNOSIS — Z09 Encounter for follow-up examination after completed treatment for conditions other than malignant neoplasm: Principal | ICD-10-CM

## 2021-12-28 DIAGNOSIS — J449 Chronic obstructive pulmonary disease, unspecified: Principal | ICD-10-CM

## 2021-12-28 DIAGNOSIS — N400 Enlarged prostate without lower urinary tract symptoms: Principal | ICD-10-CM

## 2021-12-28 DIAGNOSIS — Z7185 Vaccine counseling: Principal | ICD-10-CM

## 2021-12-28 DIAGNOSIS — I25810 Atherosclerosis of coronary artery bypass graft(s) without angina pectoris: Principal | ICD-10-CM

## 2021-12-28 DIAGNOSIS — M5440 Lumbago with sciatica, unspecified side: Principal | ICD-10-CM

## 2021-12-28 DIAGNOSIS — G8929 Other chronic pain: Principal | ICD-10-CM

## 2021-12-28 LAB — ALT: ALT (SGPT): 26 U/L (ref 10–49)

## 2021-12-28 LAB — CBC W/ AUTO DIFF
BASOPHILS ABSOLUTE COUNT: 0.1 10*9/L (ref 0.0–0.1)
BASOPHILS RELATIVE PERCENT: 0.8 %
EOSINOPHILS ABSOLUTE COUNT: 0.8 10*9/L — ABNORMAL HIGH (ref 0.0–0.5)
EOSINOPHILS RELATIVE PERCENT: 8.5 %
HEMATOCRIT: 36.5 % — ABNORMAL LOW (ref 39.0–48.0)
HEMOGLOBIN: 12.3 g/dL — ABNORMAL LOW (ref 12.9–16.5)
LYMPHOCYTES ABSOLUTE COUNT: 2.5 10*9/L (ref 1.1–3.6)
LYMPHOCYTES RELATIVE PERCENT: 27.2 %
MEAN CORPUSCULAR HEMOGLOBIN CONC: 33.8 g/dL (ref 32.0–36.0)
MEAN CORPUSCULAR HEMOGLOBIN: 33.2 pg — ABNORMAL HIGH (ref 25.9–32.4)
MEAN CORPUSCULAR VOLUME: 98.1 fL — ABNORMAL HIGH (ref 77.6–95.7)
MEAN PLATELET VOLUME: 8.2 fL (ref 6.8–10.7)
MONOCYTES ABSOLUTE COUNT: 1 10*9/L — ABNORMAL HIGH (ref 0.3–0.8)
MONOCYTES RELATIVE PERCENT: 11.5 %
NEUTROPHILS ABSOLUTE COUNT: 4.7 10*9/L (ref 1.8–7.8)
NEUTROPHILS RELATIVE PERCENT: 52 %
PLATELET COUNT: 216 10*9/L (ref 150–450)
RED BLOOD CELL COUNT: 3.72 10*12/L — ABNORMAL LOW (ref 4.26–5.60)
RED CELL DISTRIBUTION WIDTH: 13.1 % (ref 12.2–15.2)
WBC ADJUSTED: 9 10*9/L (ref 3.6–11.2)

## 2021-12-28 LAB — CREATININE
CREATININE: 1.15 mg/dL — ABNORMAL HIGH
EGFR CKD-EPI (2021) MALE: 62 mL/min/{1.73_m2} (ref >=60)

## 2021-12-28 LAB — AST: AST (SGOT): 30 U/L (ref <=34)

## 2021-12-28 LAB — SEDIMENTATION RATE: ERYTHROCYTE SEDIMENTATION RATE: <1 mm/h (ref 0–20)

## 2021-12-28 LAB — C-REACTIVE PROTEIN: C-REACTIVE PROTEIN: <4 mg/L (ref <=10.0)

## 2021-12-28 NOTE — Unmapped (Signed)
[place] [facility] (CAMP) Clinic-- Other Population    Brief chart review conducted to assess eligibility and potential benefit from enrollment in the [place] Assessment of Medications Program (CAMP). Potential impact from pharmacist intervention warrants patient outreach. CAMP team will complete outreach to discuss eligibility and enrollment.      Vergie Living, PharmD, [place], CPP  Clinical Pharmacist - [place] [facility] of [facility]ations [facility] (CAMP)  CAMP Clinic: [phone] - Fax: [phone]

## 2021-12-28 NOTE — Unmapped (Signed)
[place] [facility] of [facility]ations Program (CAMP)  Referral RECRUITMENT SUMMARY NOTE     Patient was outreached for CAMP Services. Patient scheduled.      Other - affordability (Spiriva) - ref by Deneise Lever, FNP - 30 min    Camp Scheduled    Med Pre Visit Patient Level Data    PCP confirmed on care team?: Yes  Prescription insurance scanned into EPIC?: Yes  How many pharmacies do you obtain medications from?: 1  What type of pharmacies do you use?: Retail Pharmacy  Who helps you with your medications?: Manage Myself  Would it be helpful for [place] to talk with the person who helps you with your medications?: No  Do you have issues affording the cost of any of your medications?: Yes  DO you have fills for 90 day supplies?: Yes  Are there any other things you hope to discuss with your pharmacist during your visit?: No  Additional comments for Pre Visit Planning: Copay $47              [facility]  Clinical Operations Specialist/CPhT  [place] [facility] of Medication Program (CAMP)  (P) [phone]

## 2021-12-28 NOTE — Unmapped (Signed)
Please consider: flu shot, COVID booster, consider referral to blood doctor if blood counts low.    Referred you to pharmacy program to help with Spiriva co-pays.  Thanks!

## 2021-12-28 NOTE — Unmapped (Signed)
Assessment and Plan:     Erik [name] was seen today for hospitalization follow-up.    Diagnoses and all orders for this visit:    Chronic obstructive pulmonary disease, unspecified COPD type (CMS-HCC)  Follow-up exam          -     Feeling better after hospitalized 10/26/21-10/27/21 for pneumonia, COPD exacerbation        -     Breathing improved with daily spiriva, SABA inhaler TID        -     Uses O2 per [place], uncertain liters per minute        -     Able to perform ADLs, needs to take breaks        -     Finding co-pay for spiriva high, agreeable to speak with CAMP CPP, 'as long as I do not have to go anywhere'        -     Wanted prescription for abx and steroids on hand 'just in case'-will message PCP to discuss at upcoming visit  -     [place] Assessment of [facility]ication Program (CAMP) Visit; Future      Giant cell arteritis (CMS-HCC)           -    Last seen by Rheumatology (11/22), stable on tocilizumab        -    Agreeable for recommended labs today (ESR, CRP, AST, ALT, CBC, Cr)    Coronary artery disease involving autologous vein coronary bypass graft without angina pectoris  Essential hypertension, benign    Complaint and stable on:   -Amlodipine 10 mg daily  -Hydralazine 25 mg daily   -Losartan 100 mg daily  -Atenolol 50 mg BID, ASA, Statin  -Not taking lasix--finds ineffective, causes side effects    Tobacco use disorder      - Has cut back, only 2 packs in 2 months    - lights cigarette, but 'does not inhale'    -Aware needs to quit    Chronic bilateral low back pain with sciatica, sciatica laterality unspecified       -Percocet helpful to control pain    Macrocytosis without anemia       -Reviewed low blood counts, iron panel, B12/folate normal in 06/21     -Discussed referral to hematology versus expectant management     - Pt declined referral to day, prefers watchful waiting    Benign prostatic hyperplasia, unspecified whether lower urinary tract symptoms present       -Stable on Flomax    Vaccine counseling    - Discussed risks, benefits, advantages of recommended vaccines for age.(flu, COVID booster)  Despite patient centered discussion, patient declined recommended vaccines.  Patient aware of risks regarding decision.  Aware patient can call or return to clinic/health department/outside pharmacy when ready.        Barriers to recommended plan: transportation, financial    Return if symptoms worsen or fail to improve, for As scheduled wtih PCP.      Subjective:     HPI: Erik [name] is a 88 y.o. male here for Hospitalization Follow-up (Patient is here to follow up on pneumonia ).    Hospital follow-up:     Hospitalized at [facility] 10/26/21--10/27/21: respiratory failure secondary to COPD/pneumonia  -Discharged home on oral abx, prednisone  -Started on spirivia, nuo-neb  -Home O2 requirement: remained on 2 Liters during hospitalization, O2 sate: 91% on 2L  -No formal spirometry,  pt continued to smoke/use tobacco    COPD:  Using albuterol MDI  Using spirivia: co-pay is $47/month, finding expensive, but using and feeling better with inhaler  Able to perform ADLs, has to take breaks  Home O2 -nasal cannula, does not know liters/minute  Wants to know if he can have oral steroids/abx on hand for future illnesses  Not keen on returning to hospital    HTN:  Amlodipine 10 mg daily  Hydralazine 25 mg daily   Losartan 100 mg daily  Wants to know if he can stop lasix--not effective, causing excessive urination    CAD w/hx CABG:  Complaint with atenolol 50 mg BID, ASA, 10 mg simvastatin    BPH:  Continues on flomax    Giant cell arteritis:   Tocilizumab 162 mg subcutaneous q2weeks, seen by Rhuematology 10/30/21  Needs labs at today's visit  Following up with specialist, in person: 4/23 or 5/23    Cognitive impairment:  Noted on SLUMs during hospitalization, which warranting further work-up  Lives alone, but family near [place], grand-daughter live nearby  1st time out of house since hospitalized      Anemia:  Hemoglobin 10.2--13.4  Hematocrit 29.9-40.0  B12, iron, folate, ferritin: normal 06/21  PCP thought low levels due to ETOH use, recommended hematology follow-up if ongoing.  In past, drank 18 beers when 'back in service', currently 1-2 beers/day, no liquor  Discussed hematology referral-pt 'wonders what they will do'    Tobacco:  2 packs over 2 months, cutting back, not inhaling  Quit for 25 years--'way back'    Discussed flu shot, will consider and obtain at visit with PCP 01/15/21  Discussed COVID booster-plans to get at outside pharmacy  I have reviewed past medical, surgical, medications, allergies, social and family histories today and updated them in Epic where appropriate.    ROS:       Review of systems negative unless otherwise noted as per HPI.      Objective:     Vitals:    12/28/21 1300   BP: 132/60   Pulse: 65   Temp: 36.7 ??C (98.1 ??F)   SpO2: 97%     Body mass index is 25.44 kg/m??.    Physical Exam  Vitals and nursing note reviewed.   Constitutional:       General: He is not in acute distress.     Appearance: Normal appearance. He is not ill-appearing, toxic-appearing or diaphoretic.   HENT:      Head: Normocephalic and atraumatic.   Cardiovascular:      Rate and Rhythm: Normal rate and regular rhythm.      Pulses: Normal pulses.      Heart sounds: Normal heart sounds.   Pulmonary:      Effort: Pulmonary effort is normal.      Breath sounds: Normal breath sounds.   Musculoskeletal:      Cervical back: Normal range of motion and neck supple.      Comments: Gait steady, slow   Skin:     Capillary Refill: Capillary refill takes less than 2 seconds.      Comments: Trace edema noted BLE   Neurological:      General: No focal deficit present.      Mental Status: He is alert and oriented to person, place, and time. Mental status is at baseline.   Psychiatric:         Mood and Affect: Mood normal.  Behavior: Behavior normal.         Thought Content: Thought content normal. Judgment: Judgment normal.            Medication adherence and barriers to the treatment plan have been addressed. Opportunities to optimize healthy behaviors have been discussed. Patient / caregiver voiced understanding.   I personally spent 45 minutes face-to-face and non-face-to-face in the care of this patient, which includes all pre, intra, and post visit time on the date of service.  Erik Dew, DNP, FNP-C  [facility]  [phone] (F)    Note - This record has been created using [employer]. Chart creation errors have been sought, but may not always have been located. Such creation errors do not reflect on the standard of medical care.

## 2021-12-31 NOTE — Unmapped (Signed)
[place] Assessment of Medications Program (CAMP)      Referral RECRUITMENT SUMMARY NOTE     Patient was outreached to remind about upcoming CAMP appointment. Patient confirmed appointment.    Benna Dunks [place]  Clinical Operations Specialist/CPhT  [place] Assessment of Medication Program (CAMP)  (P) [phone]

## 2022-01-01 ENCOUNTER — Ambulatory Visit: Admit: 2022-01-01 | Discharge: 2022-01-02

## 2022-01-01 DIAGNOSIS — J449 Chronic obstructive pulmonary disease, unspecified: Principal | ICD-10-CM

## 2022-01-01 NOTE — Unmapped (Signed)
[place] Assessment of Medications Program (CAMP) Clinic-- Other Population    To patients reading this note: Please be advised that the primary purpose of this note is for [place] to keep track of your care and communication with other members of your medical team. These suggested changes are intended to enhance the overall care you receive . Approved changes will be communicated to you by your care team.      Erik Erik [name], a 88 y.o. male who was referred by Erik Dew, FNP for an initial telephone visit with the CAMP Clinic as part of the Other Population for affordability issues (Spiriva).                                                         Assessment/Plan:      No problem-specific Assessment & Plan notes found for this encounter.     Affordability   Patient reports the following medications are unaffordable: Spiriva $47  Prescription insurance status: [employer] Medicare Advantage     Assessment / Plan  Reviewed patient's formulary. Spiriva Handihaler tier 3 ($47.00 in coverage phase). Therapeutic alternatives such as Incruse Ellipta are also Tier 3 ($47.00 in coverage phase)  Goal of therapy to lower medication cost and improve adherence.   ?? Reviewed plan findings with the patient. Counseled patient that therapeutic alternatives such as Incruse Ellipta are also tier 3 and would be same price  ?? Therefore, at this time the only the resources available to reduce the cost of the medication would include MAP or Extra Help.  Provided patient with the information to apply for the manufacturer assistance program for Spiriva  to see if they will qualify to get the medication for free from the manufacturer.  _____________________________________________________      Medication Management  Medications reviewed with patient & education provided regarding medications and CAMP clinic. Reviewed the indication, dose, and frequency of each medication with patient. Patient verbalized understanding.         The patient reports they are currently: at home. I spent 11 minutes on the phone with the patient on the date of service. I spent an additional 15 minutes on pre- and post-visit activities on the date of service.     The patient was physically located in [place] or a state in which I am permitted to provide care. The patient and/or parent/Erik [name] understood that s/he may incur co-pays and cost sharing, and agreed to the telemedicine visit. The visit was reasonable and appropriate under the circumstances given the patient's presentation at the time.    The patient and/or parent/Erik [name] has been advised of the potential risks and limitations of this mode of treatment (including, but not limited to, the absence of in-person examination) and has agreed to be treated using telemedicine. The patient's/patient's family's questions regarding telemedicine have been answered.     If the visit was completed in an ambulatory setting, the patient and/or parent/Erik [name] has also been advised to contact their provider???s office for worsening conditions, and seek emergency medical treatment and/or call 911 if the patient deems either necessary.          Future Appointments   Date Time Provider Department Center   01/15/2022  3:20 PM Erik Milliner, MD [facility]   05/08/2022 12:00 PM Erik Das, MD [facility]         Notes and recommendations from today's visit will be sent via Epic to Dr. Vinson Erik [name] (PCP) and Erik Dew, FNP (referring provider)    Erik Erik [name], PharmD, CPP  Clinical Pharmacist - [place] Assessment of Medications Program (CAMP)  Direct Phone Number: [phone]  CAMP Clinic: [phone] - Fax [phone]                                                               Subjective      Alcohol/Tobacco/Drug use:  Tobacco use:   reports that he has been smoking cigarettes. He has a 4.00 pack-year smoking history. He has never used smokeless tobacco.  Alcohol use: reports current alcohol use of about 2.0 standard drinks per week.  Drug use:  reports no history of drug use.    HPI:   Please see problem based charting above for details related to specific disease states.      Med Pre Visit Patient Level Data    PCP confirmed on care team?: Yes  Prescription insurance scanned into EPIC?: Yes  How many pharmacies do you obtain medications from?: 1  What type of pharmacies do you use?: Retail Pharmacy  Who helps you with your medications?: Manage Myself  Would it be helpful for [place] to talk with the person who helps you with your medications?: No  Do you have issues affording the cost of any of your medications?: Yes  DO you have fills for 90 day supplies?: Yes  Are there any other things you hope to discuss with your pharmacist during your visit?: No  Additional comments for Pre Visit Planning: Copay $47            Objective    Patient Active Problem List   Diagnosis   ??? Essential hypertension, benign   ??? Chronic back pain   ??? CAD (coronary artery disease), autologous vein bypass graft   ??? Right carotid bruit   ??? Tobacco abuse counseling   ??? Tobacco use disorder   ??? Chronic pain syndrome   ??? COPD exacerbation (CMS-HCC)   ??? Risk for falls   ??? Vision, loss, sudden, left   ??? Giant cell arteritis (CMS-HCC)   ??? Duodenitis   ??? Melena   ??? Constipation   ??? DDD (degenerative disc disease), lumbar   ??? Dyslipidemia   ??? Macrocytosis without anemia   ??? BPH (benign prostatic hyperplasia)      Current Outpatient Medications on File Prior to Visit   Medication Sig Dispense Refill   ??? albuterol HFA 90 mcg/actuation inhaler Inhale 2 puffs every six (6) hours as needed for wheezing. 9 g 3   ??? amLODIPine (NORVASC) 10 MG tablet Take 1 tablet (10 mg total) by mouth daily. 90 tablet 3   ??? ascorbic acid (VITAMIN C) 500 MG tablet Take 500 mg by mouth daily.     ??? aspirin (ECOTRIN) 81 MG tablet Take 81 mg by mouth daily.     ??? atenoloL (TENORMIN) 50 MG tablet Take 1 tablet (50 mg total) by mouth Two (2) times a day. 180 tablet 3   ??? calcium carbonate (OS-CAL) 1,500 mg (600 mg elem calcium) tablet Take 1 tablet by mouth Two (2) times a day.     ???  calcium-vitamin D (CALCIUM-VITAMIN D) 500 mg-5 mcg (200 unit) per tablet Take 1 tablet by mouth daily.     ??? cyanocobalamin, vitamin B-12, 1000 MCG tablet Take 1,000 mcg by mouth daily.     ??? furosemide (LASIX) 20 MG tablet 1/2 to 1 tab daily prn edema 30 tablet 1   ??? hydrALAZINE (APRESOLINE) 25 MG tablet Take 1 tablet (25 mg total) by mouth daily. 90 tablet 3   ??? losartan (COZAAR) 100 MG tablet Take 1 tablet (100 mg total) by mouth daily. 90 tablet 3   ??? multivitamin (TAB-A-VITE/THERAGRAN) per tablet Take 1 tablet by mouth daily.     ??? oxyCODONE-acetaminophen (PERCOCET) 5-325 mg per tablet Take 1 tablet by mouth every six (6) hours as needed for pain. 120 tablet 0   ??? simvastatin (ZOCOR) 10 MG tablet Take 1 tablet (10 mg total) by mouth daily. 90 tablet 3   ??? tamsulosin (FLOMAX) 0.4 mg capsule Take 1 capsule (0.4 mg total) by mouth daily. 90 capsule 3   ??? tiotropium (SPIRIVA WITH HANDIHALER) 18 mcg inhalation capsule Place 1 capsule (18 mcg total) into inhaler and inhale daily. 30 capsule 2   ??? tocilizumab (ACTEMRA) 162 mg/0.9 mL Syrg subcutaneous injection Inject the contents of one syringe (162 mg total) under the skin every fourteen (14) days. 6 mL 3   ??? triamcinolone (KENALOG) 0.1 % cream Apply topically Two (2) times a day. 80 g 1     No current facility-administered medications on file prior to visit.      Lab Results   Component Value Date    BUN 16 10/27/2021    BUN 10 01/12/2015    Creatinine Whole Blood, POC 0.8 10/11/2017    Creatinine 1.15 (H) 12/28/2021    Sodium 132 (L) 10/27/2021    Sodium 135 01/12/2015    Potassium 4.5 10/27/2021    Potassium 4.3 01/12/2015      No results found for: A1C, MICROALBQTUR, MALBCRERAT, CREATUR   Lab Results   Component Value Date    Cholesterol 115 04/20/2021    Cholesterol, Total 124 01/12/2015    HDL 45 04/20/2021    HDL 60 (H) 01/12/2015    LDL Calculated 55 04/20/2021    LDL Cholesterol, Calculated 53 01/12/2015      The 10-yr ASCVD Risk score Denman George DC Jr., et al., 2013) failed to calculate due to the following reason:  The 2013 10-yr ASCVD risk score is only valid if the patient does not have prior/existing clinical ASCVD (myocardial infarction, stroke, CABG, coronary angioplasty, angina or peripheral arterial disease, coronary atherosclerosis, ischemic heart disease, or cerebrovascular disease). The patient has prior/existing ASCVD.  Had CABG  Has Coronary Atherosclerosis

## 2022-01-07 NOTE — Unmapped (Signed)
Medicare Annual Wellness Visit    Risks identified- no recent falls history    End of Life Care Planning  Advance Directives-in place, patient is a DNR and DNI    Personalized Prevention Plan  During the course of the visit the patient was educated and counseled about appropriate screening and preventive services.  A personalized prevention plan was reviewed with the patient and a written copy was provided for personal records (available for review in Patient Instructions).  The following screening tests were ordered today:    Health Maintenance-  PSA screening-defer as per guidelines  CRS/Colonoscopy-defer as per guidelines  Tdap vaccine-  influenza vaccine- pt declined  Pneumovax-2011  Prevnar-2016  ShingRix-recommended and patient to receive at local pharmacy  Eye exam- pt reminded to schedule but he declines due to cost  Covid 19 vaccine-Moderna series completed 05/2020; declined booster      PMH/HPI  Patient and I had a telemedicine encounter in August/2022 for the following medical conditions:    -BPH:  History of slow urinary flow- pt started on Flomax 0.4 mg at bedtime.  His symptoms remain stable.  He denies retention symptoms.     - chronic low back pain in the setting of lumbar DDD: ??He insisted on keeping his percocet Rx at 120 tabs a month. Review of PDMP reflects compliance. ??Annual UDS done in 03/2021, confirmed presence of opioid and he  renewed his annual controlled substance contract at that time.   Review of pt's PDMP reflects med compliance.   Contract and UDS done today.     - COPD history: pt continue to smoke occasionally. ??He has had no chronic SOB symptoms but he was admitted to [facility] hospital for COPD exacerbation in the setting of Rhinovirus infection and CAP. ??He has had no formal PFT's.  ??Most inhalers prescribed in the past have been to costly for the pt.  CXR w/ infiltrate c/w CAP and viral panel positive for rhinovirus. Treated with 5 days of prednisone, cefdinir x 5 days and azithromycin x 3 days, and discharged with spiriva, duonebs, and required home O2 (2L with exertion).  He has been doing well since hospital discharge and he has continued on Spiriva but costly.  I have completed Pt Assistance program application today to get Rx through [facility].   He is currently not on duoneb and he has the albuterol inhaler. He uses rescue inhaler twice daily.  If pt is unable to get Spiriva through pt assistance program, I can restart Duoneb which pt received during hospitalization.     - history of Giant Cell arteritis: he will continue follow up with his rheumatologist as scheduled along with medication regimen as directed.??He self administers every 2 weeks Actemra injections and he had a rheumatology follow up visit in 02/2021. He has a history of ischemic optic neuropathy and he was last seen by [facility] in 2019. Pt states he has his routine eye exam at [facility] optometry and is reminded to schedule an appt soon. Last rheumatology encounter from 02/2021, details are as follows:   Assessment/Recommendations:   88 y.o. smoker with h/o COPD, CAD s/p CABG,  HTN, active alcohol use, GIB 2/2 duodenal ulcer (likely 2/2 concurrent ibuprofen, alcohol and prednisone use) presenting for follow-up of  biopsy proven giant cell arteritis  complicated by ischemic optic neuropathy/ vision loss in left eye (dx 11/2017)  He was last seen 02/22/20 as telemedicine visit with Dr. Ilsa Iha  Currently taking tocilizumab 162 mg SQ q other  week and tolerating without any side effects. Reports persistent vision loss of left eye. No new vision changes, headaches, temporal artery tenderness, scalp tenderness, jaw pain/claudication, shoulder/hip stiffness, fevers, night sweat, weight loss, excessive fatigue of concern.   - Continue tocilizumab 162 mg subcutaneous q2weeks. Refill sent in 02/23/21 by Dr. Ilsa Iha  - Update lab monitoring: CBCw/diff, Crt, AST, ALT, ESR, CRP. Pt agrees to complete at PCP. Orders placed.   - Of note, patient with known h/o GIB/duodenal ulcer which does increase risk of GI perforation on tocilizumab - however, he is now >18 months out from this without any complications and wishes to continue treatment with tocilizumab  - Encouraged pt to receive third COVID-19 vaccine locally ASAP. Discussed option of Evusheld 2 weeks post vaccine. Will mail Evusheld pt fact sheet to pt to review. Asked pt to notify clinic if he'd like to receive 2 weeks post vaccine.   F/U in 6-8 months with Dr. Ilsa Iha    ESR, CRP were normal in January/2023. He has a scheduled Rheumatology visit in 04/2022.      - HTN/CAD/Dyslipidemia history: pt remains normotensive and chest pain free on current medication regimen. His home BP readings are normal.  Creatinine level was 1.15 in January/2023, ALT/AST. ??He had essentially??normal electrolytes, creatinine and LFT's in 10/2021.????Lipid panel done in 03/2021 revealed HDL/LDL of 45/55. His home BP readings are always normal.  He denies chest pain and he is currently not followed by cardiology.  He is due for lipid panel today.    -Macrocytic anemia: Noted on serial CBCs in recent past pt states he was a heavy drinker in the past but more recently reduced his daily alcohol consumption to 3 beers a day.  Repeat CBC done in 03/2021 revealed H/H of 12/34 with mildly elevated MCV at 96.7. he has had normal  normal ferritin, iron, folate and vitamin B12 levels in 2021.   Recent CBC done in November/2022 revealed stable H/H at 11/33 with MCV of 99 and normal white blood cell and platelet counts. I will check a TSH today.               Orders Placed This Encounter   Procedures   ??? Lipid Panel     Order Specific Question:   Fasting required for collection?     Answer:   Preferred   ??? TSH   ??? Toxicology Screen, Urine     Order Specific Question:   Release to patient     Answer:   Immediate       Subjective:     Erik [name] is a 88 y.o. male who presents for a Medicare Wellness Visit.      Medicare eligibility date: Age 29  Type of visit: Subsequent AWV    Health Risk Assessment:  The patient's Health Risk Assessment forms were completed/reviewed.      Current Opioid use:yes  Risk of Opioid medication reviewed:yes  Pain control on current Opioidyes  Other pain medication options discussed:yes  Follow up with Pain management if applicable:yes    Comprehensive Medical History  Patient Active Problem List   Diagnosis   ??? Essential hypertension, benign   ??? Chronic back pain   ??? CAD (coronary artery disease), autologous vein bypass graft   ??? Right carotid bruit   ??? Tobacco abuse counseling   ??? Tobacco use disorder   ??? Chronic pain syndrome   ??? COPD exacerbation (CMS-HCC)   ??? Risk for falls   ??? Vision,  loss, sudden, left   ??? Giant cell arteritis (CMS-HCC)   ??? Duodenitis   ??? Melena   ??? Constipation   ??? DDD (degenerative disc disease), lumbar   ??? Dyslipidemia   ??? Macrocytosis without anemia   ??? BPH (benign prostatic hyperplasia)   ??? COPD (chronic obstructive pulmonary disease) (CMS-HCC)   ??? Mild anemia     Past Medical History:   Diagnosis Date   ??? Cataract 2005    cataract surgery   ??? COPD (chronic obstructive pulmonary disease) (CMS-HCC) 10/07/2016   ??? DJD (degenerative joint disease), lumbar    ??? Hypertension    ??? Mild anemia 01/15/2022   ??? Tobacco abuse 02/15/2014     Past Surgical History:   Procedure Laterality Date   ??? CARDIAC VALVE REPLACEMENT      triple bypass   ??? CATARACT EXTRACTION     ??? CORONARY ARTERY BYPASS GRAFT  2001    x3   ??? EYE SURGERY  2005    cataracts   ??? PR REPAIR ING HERNIA,5+Y/O,REDUCIBL Left 06/19/2015    Procedure: REPAIR INITIAL INGUINAL HERNIA, AGE 34 YEARS OR OLDER; REDUCIBLE;  Surgeon: Shelda Jakes, MD;  [facility];  Service: Trauma   ??? PR TEMPORAL ARTERY LIGATN OR BX Left 10/23/2017    Procedure: LIGATION OR BIOPSY, TEMPORAL ARTERY;  Surgeon: Vivien Rossetti, MD;  Location[facility];  Service: ENT   ??? PR UPPER GI ENDOSCOPY,DIAGNOSIS N/A 12/17/2017 Procedure: UGI ENDO, INCLUDE ESOPHAGUS, STOMACH, & DUODENUM &/OR JEJUNUM; DX W/WO COLLECTION SPECIMN, BY BRUSH OR WASH;  Surgeon: Bluford Kaufmann, MD;  [facility];  Service: Gastroenterology     Family History   Problem Relation Age of Onset   ??? Thyroid disease Sister    ??? Cancer Brother    ??? Glaucoma Neg Hx    ??? Macular degeneration Neg Hx    ??? Retinal detachment Neg Hx    ??? Substance Abuse Disorder Neg Hx    ??? Mental illness Neg Hx      No Known Allergies  Current Outpatient Medications   Medication Sig Dispense Refill   ??? albuterol HFA 90 mcg/actuation inhaler Inhale 2 puffs every six (6) hours as needed for wheezing. 9 g 3   ??? amLODIPine (NORVASC) 10 MG tablet Take 1 tablet (10 mg total) by mouth daily. 90 tablet 3   ??? ascorbic acid (VITAMIN C) 500 MG tablet Take 500 mg by mouth daily.     ??? aspirin (ECOTRIN) 81 MG tablet Take 81 mg by mouth daily.     ??? atenoloL (TENORMIN) 50 MG tablet Take 1 tablet (50 mg total) by mouth Two (2) times a day. 180 tablet 3   ??? calcium carbonate (OS-CAL) 1,500 mg (600 mg elem calcium) tablet Take 1 tablet by mouth Two (2) times a day.     ??? calcium-vitamin D 500 mg-5 mcg (200 unit) per tablet Take 1 tablet by mouth daily.     ??? cyanocobalamin, vitamin B-12, 1000 MCG tablet Take 1,000 mcg by mouth daily.     ??? furosemide (LASIX) 20 MG tablet 1/2 to 1 tab daily prn edema 30 tablet 1   ??? hydrALAZINE (APRESOLINE) 25 MG tablet Take 1 tablet (25 mg total) by mouth daily. 90 tablet 3   ??? losartan (COZAAR) 100 MG tablet Take 1 tablet (100 mg total) by mouth daily. 90 tablet 3   ??? multivitamin (TAB-A-VITE/THERAGRAN) per tablet Take 1 tablet  by mouth daily.     ??? mv-mins/folic/lycopene/ginkgo (ONE-A-DAY MEN'S 50+ ADVANTAGE ORAL) Take by mouth.     ??? oxyCODONE-acetaminophen (PERCOCET) 5-325 mg per tablet Take 1 tablet by mouth every six (6) hours as needed for pain. 120 tablet 0   ??? simvastatin (ZOCOR) 10 MG tablet Take 1 tablet (10 mg total) by mouth daily. 90 tablet 3   ??? tamsulosin (FLOMAX) 0.4 mg capsule Take 1 capsule (0.4 mg total) by mouth daily. 90 capsule 3   ??? tiotropium (SPIRIVA WITH HANDIHALER) 18 mcg inhalation capsule Place 1 capsule (18 mcg total) into inhaler and inhale daily. 30 capsule 2   ??? tocilizumab (ACTEMRA) 162 mg/0.9 mL Syrg subcutaneous injection Inject the contents of one syringe (162 mg total) under the skin every fourteen (14) days. 6 mL 3   ??? triamcinolone (KENALOG) 0.1 % cream Apply topically Two (2) times a day. 80 g 1     No current facility-administered medications for this visit.       Hospitalizations:  Yes, in 10/2021    Current Providers:   Patient Care Team:  Jenell Milliner, MD as PCP - General (Family Medicine)  Jenell Milliner, MD as PCP - General-ATTRIBUTED    Other Specialists, Providers, Medical Suppliers:  [facility] rheumatology    Social History:   Occupation:  retired   Marital Status:  widowed   Lives with:  lives alone   Diet:  Low Sodium   Physical Activity:  Unable to exercise due to pain  Social History     Socioeconomic History   ??? Marital status: Widowed     Spouse name: None   ??? Number of children: None   ??? Years of education: None   ??? Highest education level: None   Tobacco Use   ??? Smoking status: Every Day     Packs/day: 1.00     Years: 4.00     Pack years: 4.00     Types: Cigarettes   ??? Smokeless tobacco: Never   ??? Tobacco comments:     quit for 25 years, restarted in 2014   Vaping Use   ??? Vaping Use: Never used   Substance and Sexual Activity   ??? Alcohol use: Yes     Alcohol/week: 2.0 standard drinks     Types: 2 Cans of beer per week     Comment: daily   ??? Drug use: No   Other Topics Concern   ??? Do you use sunscreen? No   ??? Tanning bed use? No   ??? Are you easily burned? No   ??? Excessive sun exposure? No   ??? Blistering sunburns? Yes     Comment: Past   Social History Narrative    Lives in [place], wife passed 7 years ago. His daughter lives with him. Granddaughter, Victorino Dike, manages his finances.      Social Determinants of Health     Financial Resource Strain: Low Risk    ??? Difficulty of Paying Living Expenses: Not hard at all   Food Insecurity: No Food Insecurity   ??? Worried About Programme researcher, broadcasting/film/video in the Last Year: Never true   ??? Ran Out of Food in the Last Year: Never true   Transportation Needs: No Transportation Needs   ??? Lack of Transportation (Medical): No   ??? Lack of Transportation (Non-Medical): No       Preventive Care:  Health Maintenance   Topic Date Due   ??? COPD Spirometry  Never done   ???  DTaP/Tdap/Td Vaccines (1 - Tdap) Never done   ??? Zoster Vaccines (1 of 2) Never done   ??? COVID-19 Vaccine (3 - Moderna risk series) 06/20/2020   ??? Influenza Vaccine (1) 08/23/2021   ??? Potassium Monitoring  10/27/2022   ??? Serum Creatinine Monitoring  12/28/2022   ??? Pneumococcal Vaccine 65+  Completed     Immunization History   Administered Date(s) Administered   ??? COVID-19 VACCINE,MRNA(MODERNA)(PF) 04/25/2020, 05/23/2020   ??? Influenza Vaccine Quad (IIV4 PF) 65mo+ injectable 01/15/2017, 11/05/2018, 09/16/2019   ??? Influenza Virus Vaccine, unspecified formulation 08/23/2014   ??? PNEUMOCOCCAL POLYSACCHARIDE 23 12/23/2009   ??? Pneumococcal Conjugate 13-Valent 04/06/2015       Depression Screen:  1.  Over the past two weeks, have you felt down, depressed or hopeless?  No  2.  Over the past two weeks, have you felt little interest or pleasure in doing things?  No    Safety Screen:  1.  Do you need help with the phone, transportation, shopping, preparing meals, housework, laundry, medications, or managing money?  No  2.  Does your home have rugs in the hallway, lack grab bars in the bathroom, lack handrails on the stairs, or have poor lighting? No       Objective:     Blood pressure 138/58, pulse 62, temperature 36.6 ??C (97.9 ??F), height 157.5 cm (5' 2), weight 65.3 kg (144 lb), SpO2 96 %.  Body mass index is 26.34 kg/m??.    Functional Ability:  Mobility Test : limited due to chronic low back pain  Hearing Assessment: normal to finger rub testing  Memory Assessment: normal clock drawing with time       Physical Exam:  Neck exam- no masses  Lung exam- clear to auscultation  Cardiac exam- RRR nl s1 and s2  abd exam- no HSM, non tender, no masses  Ext exam- no edema    Assessment and Plan-  AWV  Screening labs-the patient had recent CBC, BMP, LFTs done in November/2022.  He is due for lipid panel and TSH today.  Addition he is due for his annual UDS and controlled substance contract renewal.  Referrals- none needed  Vaccines- Tdap recommended  Follow up- 4 month

## 2022-01-15 ENCOUNTER — Ambulatory Visit: Admit: 2022-01-15 | Discharge: 2022-01-16 | Payer: MEDICARE

## 2022-01-15 DIAGNOSIS — Z0000 Encounter for general adult medical examination without abnormal findings: Principal | ICD-10-CM

## 2022-01-15 DIAGNOSIS — M316 Other giant cell arteritis: Principal | ICD-10-CM

## 2022-01-15 DIAGNOSIS — Z79899 Other long term (current) drug therapy: Principal | ICD-10-CM

## 2022-01-15 DIAGNOSIS — G894 Chronic pain syndrome: Principal | ICD-10-CM

## 2022-01-15 DIAGNOSIS — J449 Chronic obstructive pulmonary disease, unspecified: Principal | ICD-10-CM

## 2022-01-15 DIAGNOSIS — I10 Essential (primary) hypertension: Principal | ICD-10-CM

## 2022-01-15 DIAGNOSIS — G8929 Other chronic pain: Principal | ICD-10-CM

## 2022-01-15 DIAGNOSIS — N400 Enlarged prostate without lower urinary tract symptoms: Principal | ICD-10-CM

## 2022-01-15 DIAGNOSIS — F17200 Nicotine dependence, unspecified, uncomplicated: Principal | ICD-10-CM

## 2022-01-15 DIAGNOSIS — E785 Hyperlipidemia, unspecified: Principal | ICD-10-CM

## 2022-01-15 DIAGNOSIS — I25810 Atherosclerosis of coronary artery bypass graft(s) without angina pectoris: Principal | ICD-10-CM

## 2022-01-15 DIAGNOSIS — D649 Anemia, unspecified: Principal | ICD-10-CM

## 2022-01-15 DIAGNOSIS — M5440 Lumbago with sciatica, unspecified side: Principal | ICD-10-CM

## 2022-01-15 DIAGNOSIS — M5136 Other intervertebral disc degeneration, lumbar region: Principal | ICD-10-CM

## 2022-01-15 LAB — TSH: THYROID STIMULATING HORMONE: 5.544 u[IU]/mL — ABNORMAL HIGH (ref 0.550–4.780)

## 2022-01-15 LAB — TOXICOLOGY SCREEN, URINE
AMPHETAMINE SCREEN URINE: NEGATIVE
BARBITURATE SCREEN URINE: NEGATIVE
BENZODIAZEPINE SCREEN, URINE: NEGATIVE
BUPRENORPHINE, URINE SCREEN: NEGATIVE
CANNABINOID SCREEN URINE: NEGATIVE
COCAINE(METAB.)SCREEN, URINE: NEGATIVE
FENTANYL SCREEN, URINE: NEGATIVE
METHADONE SCREEN, URINE: NEGATIVE
OPIATE SCREEN URINE: NEGATIVE
OXYCODONE SCREEN URINE: POSITIVE — AB

## 2022-01-15 LAB — LIPID PANEL
CHOLESTEROL/HDL RATIO SCREEN: 2.5 (ref 1.0–4.5)
CHOLESTEROL: 138 mg/dL (ref <=200)
HDL CHOLESTEROL: 55 mg/dL (ref 40–60)
LDL CHOLESTEROL CALCULATED: 66 mg/dL (ref 40–99)
NON-HDL CHOLESTEROL: 83 mg/dL (ref 70–130)
TRIGLYCERIDES: 87 mg/dL (ref 0–150)
VLDL CHOLESTEROL CAL: 17.4 mg/dL (ref 12–42)

## 2022-01-15 NOTE — Unmapped (Signed)
Get Tdap (tetanus ) vaccine at local pharmacy

## 2022-01-16 NOTE — Unmapped (Signed)
UDS is consistent with the pt's percocet usage

## 2022-01-18 NOTE — Unmapped (Signed)
Reason for call:   PT is requesting for a call back from Dr. Ilsa Iha or a nurse regarding his refills for Actemra. Please call him at [phone]    Last ov: Visit date not found  Next ov: 05/08/2022

## 2022-01-21 MED ORDER — ACTEMRA 162 MG/0.9 ML SUBCUTANEOUS SYRINGE
SUBCUTANEOUS | 3 refills | 84 days
Start: 2022-01-21 — End: ?

## 2022-01-21 NOTE — Unmapped (Signed)
Actemra Refill  Last Visit Date: Visit date not found  Next Visit Date: 05/08/2022    Lab Results   Component Value Date    ALT 26 12/28/2021    AST 30 12/28/2021    ALBUMIN 4.1 10/26/2021    CREATININE 1.15 (H) 12/28/2021     Lab Results   Component Value Date    WBC 9.0 12/28/2021    HGB 12.3 (L) 12/28/2021    HCT 36.5 (L) 12/28/2021    PLT 216 12/28/2021     Lab Results   Component Value Date    NEUTROPCT 52.0 12/28/2021    LYMPHOPCT 27.2 12/28/2021    MONOPCT 11.5 12/28/2021    EOSPCT 8.5 12/28/2021    BASOPCT 0.8 12/28/2021

## 2022-01-21 NOTE — Unmapped (Signed)
Addended by: Rolly Salter on: 01/21/2022 01:34 PM     Modules accepted: Orders

## 2022-01-22 MED ORDER — ACTEMRA 162 MG/0.9 ML SUBCUTANEOUS SYRINGE
SUBCUTANEOUS | 3 refills | 84.00000 days | Status: CP
Start: 2022-01-22 — End: ?

## 2022-01-23 NOTE — Unmapped (Signed)
Addended by: Dow Adolph A on: 01/22/2022 04:09 PM     Modules accepted: Orders

## 2022-01-23 NOTE — Unmapped (Signed)
Spoke to patient and let him know that Actemra refill has been sent to the pharmacy.   Patient stated he will call them to get his refills.

## 2022-01-24 DIAGNOSIS — M316 Other giant cell arteritis: Principal | ICD-10-CM

## 2022-01-25 NOTE — Unmapped (Signed)
Patient is requesting the following refill  Requested Prescriptions     Pending Prescriptions Disp Refills   ??? oxyCODONE-acetaminophen (PERCOCET) 5-325 mg per tablet 120 tablet 0     Sig: Take 1 tablet by mouth every six (6) hours as needed for pain.       Last refill given on:  12/27/21 with 120 count and 0 refills.     Recent Visits  Date Type Provider Dept   01/15/22 Office Visit Jenell Milliner, MD [facility]   12/28/21 Office Visit Deneise Lever, FNP [place] [facility]   Showing recent visits within past 365 days with a meds authorizing provider and meeting all other requirements  Future Appointments  Date Type Provider Dept   05/16/22 Appointment Jenell Milliner, MD [place] [facility]   Showing future appointments within next 365 days with a meds authorizing provider and meeting all other requirements       Opioid Monitoring   Urine Tox Screen Last Drug Screen Date: 01/15/2022  Opiate Confirmation Test Last Drug Screen Date: 10/21/2019  Last Opioid Dispensed Provider: Jenell Milliner, MD  Prescribed MEDD : 30  Last PDMP Review: 09/24/2021 11:25 AM  Last Opioid Pain Agreement Signed Date: Not Found  Last Non Opioid Controlled Substance Pain Agreement Signed Date: Not Found    Naloxone Ordered: Not Found  Last OV: 01/15/2022

## 2022-01-25 NOTE — Unmapped (Signed)
Patient requesting a refill on percocet. Please call the patient with any issues.

## 2022-01-26 MED ORDER — OXYCODONE-ACETAMINOPHEN 5 MG-325 MG TABLET
ORAL_TABLET | Freq: Four times a day (QID) | ORAL | 0 refills | 30.00000 days | PRN
Start: 2022-01-26 — End: 2022-02-25

## 2022-01-26 NOTE — Unmapped (Signed)
Recent:   What is the date of your last related visit? Had a routine visit on 01/15/22 per patient. No other visits since that time per patient. Needs RX refill of Oxycodone.   Related acute medications Rx'd:  No new RX meds   Home treatment tried:  NA       Relevant:   Allergies: Patient has no known allergies.  Medications: Oxycodone   Health History:   Weight: NA         Reason for Disposition  ??? Caller requesting a CONTROLLED substance prescription refill (e.g., narcotics, ADHD medicines)    Answer Assessment - Initial Assessment Questions  1. DRUG NAME: What medicine do you need to have refilled?      Oxycodone   2. REFILLS REMAINING: How many refills are remaining? (Note: The label on the medicine or pill bottle will show how many refills are remaining. If there are no refills remaining, then a renewal may be needed.)      No refills remaining   3. EXPIRATION DATE: What is the expiration date? (Note: The label states when the prescription will expire, and thus can no longer be refilled.)      06/2022   4. PRESCRIBING HCP: Who prescribed it? Reason: If prescribed by specialist, call should be referred to that group.      Dr. Vinson Moselle   5. SYMPTOMS: Do you have any symptoms?      Same symptoms feels the same as he always does per patient   6. PREGNANCY: Is there any chance that you are pregnant? When was your last menstrual period?      NA    Protocols used: MEDICATION REFILL AND RENEWAL CALL-A-AH

## 2022-01-27 ENCOUNTER — Ambulatory Visit
Admit: 2022-01-27 | Discharge: 2022-01-27 | Disposition: A | Discharge status: Home / Self Care | Payer: MEDICARE | Attending: Emergency Medicine

## 2022-01-27 ENCOUNTER — Emergency Department
Admit: 2022-01-27 | Discharge: 2022-01-27 | Disposition: A | Discharge status: Home / Self Care | Payer: MEDICARE | Attending: Emergency Medicine

## 2022-01-27 DIAGNOSIS — M25429 Effusion, unspecified elbow: Principal | ICD-10-CM

## 2022-01-27 MED ORDER — OXYCODONE-ACETAMINOPHEN 5 MG-325 MG TABLET
ORAL_TABLET | Freq: Four times a day (QID) | ORAL | 0 refills | 30 days | Status: CP | PRN
Start: 2022-01-27 — End: 2022-02-26

## 2022-01-27 NOTE — Unmapped (Signed)
Patient is requesting the following refill  Requested Prescriptions     Pending Prescriptions Disp Refills   ??? oxyCODONE-acetaminophen (PERCOCET) 5-325 mg per tablet 120 tablet 0     Sig: Take 1 tablet by mouth every six (6) hours as needed for pain.       Last refill given on: 12/25/21 with 120 count and 0 refills.     Recent Visits  Date Type Provider Dept   01/15/22 Office Visit Jenell Milliner, MD [place] [facility]   12/28/21 Office Visit Deneise Lever, FNP [place] [facility]   Showing recent visits within past 365 days with a meds authorizing provider and meeting all other requirements  Future Appointments  Date Type Provider Dept   05/16/22 Appointment Jenell Milliner, MD [place] [facility]   Showing future appointments within next 365 days with a meds authorizing provider and meeting all other requirements       Opioid Monitoring   Urine Tox Screen Last Drug Screen Date: 01/15/2022 appropriate  Opiate Confirmation Test Last Drug Screen Date: 10/21/2019  Last Opioid Dispensed Provider: Jenell Milliner, MD  Prescribed MEDD : 30  Last PDMP Review: 09/24/2021 11:25 AM  Last Opioid controlled Substance pain agreement 01/15/22  Last Non Opioid Controlled Substance Pain Agreement Signed Date: Not Found    Naloxone Ordered: Not Found  Last OV: 01/15/2022

## 2022-01-27 NOTE — Unmapped (Signed)
Pt reports having a bruise and swelling to R arm that he noticed this morning. sts he is unsure how he got the bruise.   +blood thinner

## 2022-01-27 NOTE — Unmapped (Signed)
[facility]  Emergency Department Provider Note       ED Clinical Impression     Final diagnoses:   Elbow swelling, unspecified laterality (Primary)        Impression, ED Course, Assessment and Plan     Impression:     Patient is a 88 y.o. male with PMH of COPD, HTN, CAD, BPH, and DJD on aspirin without anticoagulation otherwise presenting for evaluation of bleeding/bruising to the R elbow and upper arm which appeared this morning after he slid out of bed. Denies fevers or chills.  No head trauma, LOC, no pain at the elbow.    On exam, the patient appears well and is in no acute distress. VS are within normal limits. On exam, there is an area of fluid collection approximately 5 x 5 cm at the lateral elbow, and associated ecchymosis to the R elbow without tenderness to palpation or erythema. No evidence of compartment syndrome on exam.  His compartments are soft, without any pain, without any numbness, tingling or weakness, able to provide an okay sign, and oppose all digits without difficulty, no pain on passive stretch, 2+ radial and ulnar pulses.  Area of hematoma is marked out with a skin marker.     Differential diagnosis I suspect this is a hematoma, likely secondary to his trauma upon slipping in bed. I do not suspect compartment syndrome at this time. I have low suspicion for bony injury given his full range of motion and no bony tenderness to palpation.  Not consistent with DVT given no other swelling otherwise, localized swelling to the lateral elbow    Plan for XR R elbow, ED POCUS, coags, BMP, and CBC.     11:39 AM  Point-of-care ultrasound with what appears to be heterogeneous fluid collection the right elbow. Patient has adamantly but politely declined labs and X-rays here in the [facility] ED today, as well as serial checks over the next several hours and also declines transfer to [facility] for ultrasound to rule out potential DVT.  Arm wrapped in an Ace bandage and elevated at the bedside and discussed the importance of compression and elevation as well as continued close monitoring of his arm. I discussed the risks of leaving against my advice, including an undiagnosed blood clot, compartment syndrome, or other need for emergent surgical intervention, however patient is adamant that he would like to leave today and expresses understanding of the risks. Patient's granddaughter affirms that she will assist transporting patient to follow-up appointments with orthopedic surgery.  He is AAO x4, without signs of acute intoxication or distracting injuries.  Ambulatory without difficulty.  Strict return precautions discussed, patient and granddaughter express understanding and agreement with plan.     Additional Medical Decision Making       Discussion of Management with other Physicians, QHP or Appropriate Source:N/A  Independent Interpretation of Studies:   EKG: as below    RADIOLOGY: as below  Labs: N/A (pt declined)   External Records Reviewed: Patient's most recent discharge summary  Escalation of Care, Consideration of Admission/Observation/Transfer: N/A  Social determinants that significantly affected care: None applicable  Prescription drug(s) considered but not prescribed: N/A  Diagnostic tests considered but not performed: N/A  History obtained from other sources: Family    Labs and radiology results that were available during my care of the patient were independently reviewed by me and considered in my medical decision making.    Portions of this record have been created using  Scientist, clinical (histocompatibility and immunogenetics). Dictation errors have been sought, but may not have been identified and corrected.  ____________________________________________    Time seen: January 27, 2022 11:28 AM     History     Chief Complaint  Bleeding/Bruising      HPI   Erik [name] is a 88 y.o. male with a past medical history of COPD, HTN, CAD, BPH, and DJD presenting for evaluation of bleeding/bruising. Patient reports that he slid out of his bed this morning, striking his R upper arm, and noticed worsened R elbow swelling and bruising throughout the morning. Denies numbness or tingling.  No chest pain, shortness of breath, no pain to his hand, no difficulty moving his arm or numbness, tingling or weakness.  He only takes aspirin at home. Otherwise, denies fevers or chills.  No recent illnesses.  It has been fairly steady throughout the morning without significant worsening or increase in swelling.    Past Medical History:   Diagnosis Date   ??? Cataract 2005    cataract surgery   ??? COPD (chronic obstructive pulmonary disease) (CMS-HCC) 10/07/2016   ??? DJD (degenerative joint disease), lumbar    ??? Hypertension    ??? Mild anemia 01/15/2022   ??? Tobacco abuse 02/15/2014       Past Surgical History:   Procedure Laterality Date   ??? CARDIAC VALVE REPLACEMENT      triple bypass   ??? CATARACT EXTRACTION     ??? CORONARY ARTERY BYPASS GRAFT  2001    x3   ??? EYE SURGERY  2005    cataracts   ??? PR REPAIR ING HERNIA,5+Y/O,REDUCIBL Left 06/19/2015    Procedure: REPAIR INITIAL INGUINAL HERNIA, AGE 39 YEARS OR OLDER; REDUCIBLE;  Surgeon: Shelda Jakes, MD;  Location: [facility] OR [facility];  Service: Trauma   ??? PR TEMPORAL ARTERY LIGATN OR BX Left 10/23/2017    Procedure: LIGATION OR BIOPSY, TEMPORAL ARTERY;  Surgeon: Vivien Rossetti, MD;  [facility];  Service: ENT   ??? PR UPPER GI ENDOSCOPY,DIAGNOSIS N/A 12/17/2017    Procedure: UGI ENDO, INCLUDE ESOPHAGUS, STOMACH, & DUODENUM &/OR JEJUNUM; DX W/WO COLLECTION SPECIMN, BY BRUSH OR WASH;  Surgeon: Bluford Kaufmann, MD;  Location: [facility];  Service: Gastroenterology       No current facility-administered medications for this encounter.    Current Outpatient Medications:   ???  albuterol HFA 90 mcg/actuation inhaler, Inhale 2 puffs every six (6) hours as needed for wheezing., Disp: 9 g, Rfl: 3  ???  amLODIPine (NORVASC) 10 MG tablet, Take 1 tablet (10 mg total) by mouth daily., Disp: 90 tablet, Rfl: 3  ???  ascorbic acid (VITAMIN C) 500 MG tablet, Take 500 mg by mouth daily., Disp: , Rfl:   ???  aspirin (ECOTRIN) 81 MG tablet, Take 81 mg by mouth daily., Disp: , Rfl:   ???  atenoloL (TENORMIN) 50 MG tablet, Take 1 tablet (50 mg total) by mouth Two (2) times a day., Disp: 180 tablet, Rfl: 3  ???  calcium carbonate (OS-CAL) 1,500 mg (600 mg elem calcium) tablet, Take 1 tablet by mouth Two (2) times a day., Disp: , Rfl:   ???  calcium-vitamin D 500 mg-5 mcg (200 unit) per tablet, Take 1 tablet by mouth daily., Disp: , Rfl:   ???  cyanocobalamin, vitamin B-12, 1000 MCG tablet, Take 1,000 mcg by mouth daily., Disp: , Rfl:   ???  furosemide (LASIX) 20 MG tablet, 1/2 to 1 tab daily  prn edema, Disp: 30 tablet, Rfl: 1  ???  hydrALAZINE (APRESOLINE) 25 MG tablet, Take 1 tablet (25 mg total) by mouth daily., Disp: 90 tablet, Rfl: 3  ???  losartan (COZAAR) 100 MG tablet, Take 1 tablet (100 mg total) by mouth daily., Disp: 90 tablet, Rfl: 3  ???  multivitamin (TAB-A-VITE/THERAGRAN) per tablet, Take 1 tablet by mouth daily., Disp: , Rfl:   ???  mv-mins/folic/lycopene/ginkgo (ONE-A-DAY MEN'S 50+ ADVANTAGE ORAL), Take by mouth., Disp: , Rfl:   ???  oxyCODONE-acetaminophen (PERCOCET) 5-325 mg per tablet, Take 1 tablet by mouth every six (6) hours as needed for pain., Disp: 120 tablet, Rfl: 0  ???  simvastatin (ZOCOR) 10 MG tablet, Take 1 tablet (10 mg total) by mouth daily., Disp: 90 tablet, Rfl: 3  ???  tamsulosin (FLOMAX) 0.4 mg capsule, Take 1 capsule (0.4 mg total) by mouth daily., Disp: 90 capsule, Rfl: 3  ???  tiotropium (SPIRIVA WITH HANDIHALER) 18 mcg inhalation capsule, Place 1 capsule (18 mcg total) into inhaler and inhale daily., Disp: 30 capsule, Rfl: 2  ???  tocilizumab (ACTEMRA) 162 mg/0.9 mL Syrg subcutaneous injection, Inject the contents of one syringe (162 mg total) under the skin every fourteen (14) days., Disp: 6 mL, Rfl: 3  ???  triamcinolone (KENALOG) 0.1 % cream, Apply topically Two (2) times a day., Disp: 80 g, Rfl: 1    Allergies  Patient has no known allergies.    Family History   Problem Relation Age of Onset   ??? Thyroid disease Sister    ??? Cancer Brother    ??? Glaucoma Neg Hx    ??? Macular degeneration Neg Hx    ??? Retinal detachment Neg Hx    ??? Substance Abuse Disorder Neg Hx    ??? Mental illness Neg Hx        Social History  Social History     Tobacco Use   ??? Smoking status: Every Day     Packs/day: 1.00     Years: 4.00     Pack years: 4.00     Types: Cigarettes   ??? Smokeless tobacco: Never   ??? Tobacco comments:     quit for 25 years, restarted in 2014   Vaping Use   ??? Vaping Use: Never used   Substance Use Topics   ??? Alcohol use: Yes     Alcohol/week: 2.0 standard drinks     Types: 2 Cans of beer per week     Comment: daily   ??? Drug use: No        Physical Exam     VITAL SIGNS:    ED Triage Vitals [01/27/22 1049]   Enc Vitals Group      BP 154/63      Heart Rate 78      SpO2 Pulse       Resp 18      Temp 36.6 ??C (97.9 ??F)      Temp Source Temporal      SpO2 98 %     Constitutional: Alert and oriented. Well appearing and in no distress.  Eyes: Conjunctivae are normal.  ENT       Head: Normocephalic and atraumatic.       Nose: No congestion.       Mouth/Throat: Mucous membranes are moist.       Neck: No stridor.  Cardiovascular: Normal rate, regular rhythm.   Respiratory: Normal respiratory effort. Breath sounds are normal.  Gastrointestinal: Soft and nontender, non-distended  Genitourinary:   Musculoskeletal: No lower extremity tenderness or edema. There is an area of fluid collection and ecchymosis to the R elbow without tenderness to palpation or erythema. No evidence of compartment syndrome on exam. 2+ radial end pulse.  Neurologic: Normal speech and language. No gross focal neurologic deficits are appreciated.  Skin: Area of hematoma is marked out with a skin marker.   Psychiatric: Mood and affect are normal. Speech and behavior are normal.       EKG     And     Radiology     XR Elbow 2 Views Right   Preliminary Result No acute osseous abnormality.      Diffuse soft tissue swelling over the medial aspect of the elbow and proximal forearm, likely represents hematoma.      ED POCUS Extremity Nonvasc LTD (Effusion, Mass, Fluid) Right    (Results Pending)         I independently visualized these images.     Procedures       Pertinent labs & imaging results that were available during my care of the patient were reviewed by me and considered in my medical decision making (see chart for details).    Carolee Rota, MD  Clinical Assistant Professor  [facility] Department of Emergency Medicine      Documentation assistance was provided by Everlene Balls, Scribe, on January 27, 2022 at 11:28 AM for Carolee Rota, MD.     Documentation assistance was provided by the scribe in my presence.  The documentation recorded by the scribe has been reviewed by me and accurately reflects the services I personally performed.           Berkley Harvey, MD  01/27/22 1320

## 2022-01-30 ENCOUNTER — Ambulatory Visit: Admit: 2022-01-30 | Discharge: 2022-01-31 | Payer: MEDICARE

## 2022-01-30 DIAGNOSIS — M7989 Other specified soft tissue disorders: Principal | ICD-10-CM

## 2022-01-30 NOTE — Unmapped (Signed)
Patient assistance program needs a new rx sent to them. rx printed and faxed. sct

## 2022-01-30 NOTE — Unmapped (Signed)
Thank you for choosing [facility]!  We appreciate the opportunity to participate in your care.      If any questions or concerns arise after your visit, please do not hesitant to contact me by [place] MyChart or by calling the hand team at [phone].      Voicemail messages: Messages are checked between 8:00 am- 4:00 pm Monday- Friday.    MyChart messages: These messages are checked by the nurses during normal business hours 8:30 am-4:30 pm Monday-Friday every 24-48 hours and are for non-urgent, non-emergent concerns. You may be asked to return for a follow up visit if it is deemed your questions are best handled in the clinic setting.    Please let me know if I can be of assistance with this or other orthopaedic issues in the future.

## 2022-01-30 NOTE — Unmapped (Signed)
[facility]  [employer]. Ida Rogue, PA-C    ASSESSMENT:  Right arm ecchymosis  PLAN:  I discussed with patient there is clinical concern for hematoma along the medial aspect of the elbow with diffuse ecchymosis.  We discussed treatment options in detail including further evaluation with an ultrasound for DVT which patient has declined.  I have provided patient with a vascular surgery referral for further evaluation management.  We will treat patient conservatively with a splint for immobilization and compression.  He will follow-up in clinic in 1 week for repeat evaluation.  He was advised that if symptoms worsen he should seek medical attention immediately at the emergency department  We will proceed with the following treatment plan:  1. Medications: OTC Tylenol  2. DME/Cast: long arm splint  3. PT/OT: none  4. Injections: none  5.   Follow-up: Return in about 1 week (around 02/06/2022).   6.   X-rays at next visit: eval first    SUBJECTIVE:  Chief Complaint: Right elbow ED follow-up  History of Present Illness:   Erik [name] is a 88 y.o. right hand dominant male  who presents for evaluation of right elbow swelling and bruising that began on 2/5.  Patient reports he slid out of bed that morning and is unsure if he bumped his right elbow in the process.  Since that time he had had bruising and swelling along the medial aspect of the elbow and sought medical attention at the [facility] emergency department.  X-rays and ultrasound were performed with no evidence of acute fracture.  There was concern for possible hematoma and transferred to the main was considered for further evaluation of possible DVT of the right upper extremity which patient declined.  Patient denies any pain about the right upper extremity today and notes that bruising has spread along the forearm.  He has been elevating and applying an Ace bandage with mild improvement of symptoms.  He notes that bruising is beginning to resolve about the elbow.  He denies any numbness and tingling the right upper extremity.  He presents the clinic today with his granddaughter who aids in history.  He denies any chest pain or difficulty breathing.  He lives alone but in close proximity to his family    Medical History Past Medical History:   Diagnosis Date   ??? Cataract 2005    cataract surgery   ??? COPD (chronic obstructive pulmonary disease) (CMS-HCC) 10/07/2016   ??? DJD (degenerative joint disease), lumbar    ??? Hypertension    ??? Mild anemia 01/15/2022   ??? Tobacco abuse 02/15/2014      Surgical History Past Surgical History:   Procedure Laterality Date   ??? CARDIAC VALVE REPLACEMENT      triple bypass   ??? CATARACT EXTRACTION     ??? CORONARY ARTERY BYPASS GRAFT  2001    x3   ??? EYE SURGERY  2005    cataracts   ??? PR REPAIR ING HERNIA,5+Y/O,REDUCIBL Left 06/19/2015    Procedure: REPAIR INITIAL INGUINAL HERNIA, AGE 60 YEARS OR OLDER; REDUCIBLE;  Surgeon: Shelda Jakes, MD;  Location: [facility];  Service: Trauma   ??? PR TEMPORAL ARTERY LIGATN OR BX Left 10/23/2017    Procedure: LIGATION OR BIOPSY, TEMPORAL ARTERY;  Surgeon: Vivien Rossetti, MD;  Location: [facility];  Service: ENT   ??? PR UPPER GI ENDOSCOPY,DIAGNOSIS N/A 12/17/2017    Procedure: UGI ENDO, INCLUDE ESOPHAGUS, STOMACH, & DUODENUM &/OR JEJUNUM;  DX W/WO COLLECTION SPECIMN, BY BRUSH OR WASH;  Surgeon: Bluford Kaufmann, MD;  [facility]: [facility];  Service: Gastroenterology      Medications   Current Outpatient Medications:   ???  albuterol HFA 90 mcg/actuation inhaler, Inhale 2 puffs every six (6) hours as needed for wheezing., Disp: 9 g, Rfl: 3  ???  amLODIPine (NORVASC) 10 MG tablet, Take 1 tablet (10 mg total) by mouth daily., Disp: 90 tablet, Rfl: 3  ???  ascorbic acid (VITAMIN C) 500 MG tablet, Take 500 mg by mouth daily., Disp: , Rfl:   ???  aspirin (ECOTRIN) 81 MG tablet, Take 81 mg by mouth daily., Disp: , Rfl:   ???  atenoloL (TENORMIN) 50 MG tablet, Take 1 tablet (50 mg total) by mouth Two (2) times a day., Disp: 180 tablet, Rfl: 3  ???  calcium carbonate (OS-CAL) 1,500 mg (600 mg elem calcium) tablet, Take 1 tablet by mouth Two (2) times a day., Disp: , Rfl:   ???  calcium-vitamin D 500 mg-5 mcg (200 unit) per tablet, Take 1 tablet by mouth daily., Disp: , Rfl:   ???  cyanocobalamin, vitamin B-12, 1000 MCG tablet, Take 1,000 mcg by mouth daily., Disp: , Rfl:   ???  furosemide (LASIX) 20 MG tablet, 1/2 to 1 tab daily prn edema, Disp: 30 tablet, Rfl: 1  ???  hydrALAZINE (APRESOLINE) 25 MG tablet, Take 1 tablet (25 mg total) by mouth daily., Disp: 90 tablet, Rfl: 3  ???  losartan (COZAAR) 100 MG tablet, Take 1 tablet (100 mg total) by mouth daily., Disp: 90 tablet, Rfl: 3  ???  multivitamin (TAB-A-VITE/THERAGRAN) per tablet, Take 1 tablet by mouth daily., Disp: , Rfl:   ???  mv-mins/folic/lycopene/ginkgo (ONE-A-DAY MEN'S 50+ ADVANTAGE ORAL), Take by mouth., Disp: , Rfl:   ???  oxyCODONE-acetaminophen (PERCOCET) 5-325 mg per tablet, Take 1 tablet by mouth every six (6) hours as needed for pain., Disp: 120 tablet, Rfl: 0  ???  simvastatin (ZOCOR) 10 MG tablet, Take 1 tablet (10 mg total) by mouth daily., Disp: 90 tablet, Rfl: 3  ???  tamsulosin (FLOMAX) 0.4 mg capsule, Take 1 capsule (0.4 mg total) by mouth daily., Disp: 90 capsule, Rfl: 3  ???  tiotropium (SPIRIVA WITH HANDIHALER) 18 mcg inhalation capsule, Place 1 capsule (18 mcg total) into inhaler and inhale daily., Disp: 30 capsule, Rfl: 2  ???  tocilizumab (ACTEMRA) 162 mg/0.9 mL Syrg subcutaneous injection, Inject the contents of one syringe (162 mg total) under the skin every fourteen (14) days., Disp: 6 mL, Rfl: 3  ???  triamcinolone (KENALOG) 0.1 % cream, Apply topically Two (2) times a day., Disp: 80 g, Rfl: 1   Allergies Patient has no known allergies.     Social History Social History     Tobacco Use   ??? Smoking status: Every Day     Packs/day: 1.00     Years: 4.00     Pack years: 4.00     Types: Cigarettes   ??? Smokeless tobacco: Never   ??? Tobacco comments:     quit for 25 years, restarted in 2014   Vaping Use   ??? Vaping Use: Never used   Substance Use Topics   ??? Alcohol use: Yes     Alcohol/week: 2.0 standard drinks     Types: 2 Cans of beer per week     Comment: daily   ??? Drug use: No        Family History family history includes  Cancer in his brother; Thyroid disease in his sister.     Review of Systems 12 point review of systems were obtained in clinic and all pertinent postives/negatives were documented in the HPI.       OBJECTIVE:  Physical Exam:  General Appearance ?? well-nourished and no acute distress   Mood and Affect ?? alert, cooperative and pleasant   Gait and Station ?? neutral standing alignment   Cardiovascular ?? well-perfused distally and no swelling   Sensation ?? Sensation intact to light touch distally  ?? in the bilateral median, ulnar, and radial nerve distribution    MUSCULOSKELETAL    Right upper extremity ?? Inspection: Evidence of significant ecchymosis diffusely about the right lower arm, elbow, forearm, and hand.  Evidence of swelling along the medial aspect of the elbow  ?? Tenderness: Nontender about the wrist/hand diffusely and Nontender about the elbow diffusely  ?? ROM: Able to make a full fist and bring fingers to full extension and ROM of the elbow from 0-150 degrees  ?? Stability Exam: stable to varus and valgus stress of the elbow  ?? Strength: 5/5 grip strength   ?? Special Test: 2+ distal pulses and brisk capillary refill about the right upper extremity   Left upper extremity ?? Inspection: Skin clean, dry, and intact  ?? Tenderness: Nontender about the wrist/hand diffusely  ?? ROM: Able to make a full fist and bring fingers to full extension  ?? Stability Exam: stable to varus and valgus stress of the elbow  ?? Strength: 5/5 grip strength   ?? Special Test: none     DME ORDER:  Dx: M25.429, M25.429, Elbow swelling, unspecified laterality, Elbow swelling, unspecified laterality  Location: [facility]  Body Location: Hand/Wrist/Elbow  Hand/Wrist/Elbow Orthotics: Hinged ROM (Elbow)  Laterality: Right    DME Upper Extremity,  , The patient was prescribed this orthosis to be used on the upper extremity for the purpose of reducing pain and providing support and protection.

## 2022-02-02 NOTE — Unmapped (Unsigned)
[facility] VASCULAR SURGERY    HPI:  Erik [name] is a 88 y.o. male with past medical history of HTN, CAD, giant cell arteritis, chronic pain syndrome, HLD, DDD, and COPD who presents today for evaluation of right upper extremity swelling.     - Evidence of significant ecchymosis about the right upper extremity with no reported trauma    ROS: A 12 system review of systems was negative except as noted in HPI    Past Medical History:   Diagnosis Date   ??? Cataract 2005    cataract surgery   ??? COPD (chronic obstructive pulmonary disease) (CMS-HCC) 10/07/2016   ??? DJD (degenerative joint disease), lumbar    ??? Hypertension    ??? Mild anemia 01/15/2022   ??? Tobacco abuse 02/15/2014       Past Surgical History:   Procedure Laterality Date   ??? CARDIAC VALVE REPLACEMENT      triple bypass   ??? CATARACT EXTRACTION     ??? CORONARY ARTERY BYPASS GRAFT  2001    x3   ??? EYE SURGERY  2005    cataracts   ??? PR REPAIR ING HERNIA,5+Y/O,REDUCIBL Left 06/19/2015    Procedure: REPAIR INITIAL INGUINAL HERNIA, AGE 53 YEARS OR OLDER; REDUCIBLE;  Surgeon: Shelda Jakes, MD;  Location: [facility] OR [facility];  Service: Trauma   ??? PR TEMPORAL ARTERY LIGATN OR BX Left 10/23/2017    Procedure: LIGATION OR BIOPSY, TEMPORAL ARTERY;  Surgeon: Vivien Rossetti, MD;  Location: [facility] OR [facility];  Service: ENT   ??? PR UPPER GI ENDOSCOPY,DIAGNOSIS N/A 12/17/2017    Procedure: UGI ENDO, INCLUDE ESOPHAGUS, STOMACH, & DUODENUM &/OR JEJUNUM; DX W/WO COLLECTION SPECIMN, BY BRUSH OR WASH;  Surgeon: Bluford Kaufmann, MD;  [facility];  Service: Gastroenterology       Family History   Problem Relation Age of Onset   ??? Thyroid disease Sister    ??? Cancer Brother    ??? Glaucoma Neg Hx    ??? Macular degeneration Neg Hx    ??? Retinal detachment Neg Hx    ??? Substance Abuse Disorder Neg Hx    ??? Mental illness Neg Hx        Social History     Socioeconomic History   ??? Marital status: Widowed   Tobacco Use   ??? Smoking status: Every Day     Packs/day: 1.00     Years: 4.00     Pack years: 4.00     Types: Cigarettes   ??? Smokeless tobacco: Never   ??? Tobacco comments:     quit for 25 years, restarted in 2014   Vaping Use   ??? Vaping Use: Never used   Substance and Sexual Activity   ??? Alcohol use: Yes     Alcohol/week: 2.0 standard drinks     Types: 2 Cans of beer per week     Comment: daily   ??? Drug use: No   Other Topics Concern   ??? Do you use sunscreen? No   ??? Tanning bed use? No   ??? Are you easily burned? No   ??? Excessive sun exposure? No   ??? Blistering sunburns? Yes     Comment: Past   Social History Narrative    Lives in [place], wife passed 7 years ago. His daughter lives with him. Granddaughter, Victorino Dike, manages his finances.      Social Determinants of Health     Financial Resource Strain: Low Risk    ???  Difficulty of Paying Living Expenses: Not hard at all   Food Insecurity: No Food Insecurity   ??? Worried About Programme researcher, broadcasting/film/video in the Last Year: Never true   ??? Ran Out of Food in the Last Year: Never true   Transportation Needs: No Transportation Needs   ??? Lack of Transportation (Medical): No   ??? Lack of Transportation (Non-Medical): No       Current Outpatient Medications   Medication Instructions   ??? albuterol HFA 90 mcg/actuation inhaler 2 puffs, Inhalation, Every 6 hours PRN   ??? amLODIPine (NORVASC) 10 mg, Oral, Daily (standard)   ??? ascorbic acid (vitamin C) (VITAMIN C) 500 mg, Oral, Daily (standard)   ??? aspirin (ECOTRIN) 81 mg, Oral, Daily (standard)   ??? atenoloL (TENORMIN) 50 mg, Oral, 2 times a day (standard)   ??? calcium carbonate (OS-CAL) 1,500 mg (600 mg elem calcium) tablet 1 tablet, Oral, 2 times a day (standard)   ??? calcium-vitamin D 500 mg-5 mcg (200 unit) per tablet 1 tablet, Oral, Daily (standard)   ??? cyanocobalamin (vitamin B-12) 1,000 mcg, Oral, Daily (standard)   ??? furosemide (LASIX) 20 MG tablet 1/2 to 1 tab daily prn edema   ??? hydrALAZINE (APRESOLINE) 25 mg, Oral, Daily (standard)   ??? losartan (COZAAR) 100 mg, Oral, Daily (standard)   ??? multivitamin (TAB-A-VITE/THERAGRAN) per tablet 1 tablet, Oral, Daily (standard)   ??? mv-mins/folic/lycopene/ginkgo (ONE-A-DAY MEN'S 50+ ADVANTAGE ORAL) Oral   ??? oxyCODONE-acetaminophen (PERCOCET) 5-325 mg per tablet 1 tablet, Oral, Every 6 hours PRN   ??? simvastatin (ZOCOR) 10 mg, Oral, Daily (standard)   ??? SPIRIVA WITH HANDIHALER 18 mcg, Inhalation, Daily (standard)   ??? tamsulosin (FLOMAX) 0.4 mg, Oral, Daily (standard)   ??? tocilizumab (ACTEMRA) 162 mg/0.9 mL Syrg subcutaneous injection Inject the contents of one syringe (162 mg total) under the skin every fourteen (14) days.   ??? triamcinolone (KENALOG) 0.1 % cream Topical, 2 times a day (standard)       No Known Allergies    PE:  There were no vitals filed for this visit.  General: 88 y.o. malein NAD  Psych : A,A Ox 3  Neuro: Grossly Intact  Neck: Supple, no evidence of JVD , no evidence of thyromegaly  Chest : CTAB  Heart: RRR, No M/G/R  Abdomen: soft, NT,ND BS+  Upper extremities : warm and well perfused. 2+ radial pulses to BUE.   Lower extremities: warm and well perfused. Palpable DP/PT pulses.     ASSESSMENT:  88 y.o. male who presents for evaluation of ***.    PLAN:  - ***  - RTC in ***.    DIAGNOSTICS:    Venous Duplex Upper Extremity Right (02/04/22):        XR Elbow Right (01/27/22):    No acute fracture or malalignment of the right elbow. Small effusion.  ??  Joint spaces are preserved. Small ossified density at the lateral aspect of the capitellum may represent a loose body. Medial and lateral epicondylar enthesophytes, compatible with chronic tendinopathy.  ??  Marked soft tissue swelling of the medial and posterior proximal forearm and elbow. No soft tissue gas or radiopaque foreign body.

## 2022-02-04 ENCOUNTER — Ambulatory Visit
Admit: 2022-02-04 | Discharge: 2022-02-05 | Payer: MEDICARE | Attending: Nurse Practitioner | Primary: Nurse Practitioner

## 2022-02-04 ENCOUNTER — Ambulatory Visit: Admit: 2022-02-04 | Discharge: 2022-02-05 | Payer: MEDICARE

## 2022-02-04 DIAGNOSIS — M25429 Effusion, unspecified elbow: Principal | ICD-10-CM

## 2022-02-05 ENCOUNTER — Ambulatory Visit: Admit: 2022-02-05 | Discharge: 2022-02-06 | Payer: MEDICARE

## 2022-02-05 NOTE — Unmapped (Signed)
Patient came into clinic today for RUE venous duplex. Duplex negative for DVT. Phone call to patient and his granddaughter, Erik [name] to give results. They will follow-up with vascular surgery as needed.

## 2022-02-21 NOTE — Unmapped (Signed)
Patient is requesting the following refill  Requested Prescriptions     Pending Prescriptions Disp Refills   ??? oxyCODONE-acetaminophen (PERCOCET) 5-325 mg per tablet 120 tablet 0     Sig: Take 1 tablet by mouth every six (6) hours as needed for pain.       Last refill given on: 01/27/22 with 120 count and 0 refills.     Recent Visits  Date Type Provider Dept   01/15/22 Office Visit Jenell Milliner, MD [facility]   12/28/21 Office Visit Deneise Lever, FNP [facility]   Showing recent visits within past 365 days with a meds authorizing provider and meeting all other requirements  Future Appointments  Date Type Provider Dept   05/16/22 Appointment Jenell Milliner, MD [place] [facility]   Showing future appointments within next 365 days with a meds authorizing provider and meeting all other requirements       Opioid Monitoring   Urine Tox Screen Last Drug Screen Date: 01/15/2022  Opiate Confirmation Test Last Drug Screen Date: 10/21/2019  Last Opioid Dispensed Provider: Jenell Milliner, MD  Prescribed MEDD : 30  Last PDMP Review: 09/24/2021 11:25 AM  Last Opioid Pain Agreement Signed Date: Not Found  Last Non Opioid Controlled Substance Pain Agreement Signed Date: Not Found    Naloxone Ordered: Not Found  Last OV: 01/15/2022

## 2022-02-21 NOTE — Unmapped (Signed)
Due over weekend.  Wants called when refilled.

## 2022-02-22 NOTE — Unmapped (Signed)
error 

## 2022-02-24 MED ORDER — OXYCODONE-ACETAMINOPHEN 5 MG-325 MG TABLET
ORAL_TABLET | Freq: Four times a day (QID) | ORAL | 0 refills | 30 days | Status: CP | PRN
Start: 2022-02-24 — End: 2022-03-26

## 2022-03-07 MED ORDER — FUROSEMIDE 20 MG TABLET
ORAL_TABLET | Freq: Every day | ORAL | 0 refills | 90 days | Status: CP | PRN
Start: 2022-03-07 — End: 2023-03-08

## 2022-03-25 MED ORDER — OXYCODONE-ACETAMINOPHEN 5 MG-325 MG TABLET
ORAL_TABLET | Freq: Four times a day (QID) | ORAL | 0 refills | 30.00000 days | Status: CP | PRN
Start: 2022-03-25 — End: 2022-04-24

## 2022-03-25 NOTE — Unmapped (Signed)
Addended by: Barbaraann Boys on: 03/25/2022 10:04 AM     Modules accepted: Orders

## 2022-03-25 NOTE — Unmapped (Signed)
Patient is requesting the following refill  Requested Prescriptions     Pending Prescriptions Disp Refills    oxyCODONE-acetaminophen (PERCOCET) 5-325 mg per tablet 120 tablet 0     Sig: Take 1 tablet by mouth every six (6) hours as needed for pain.       Last refill given on: 02/21/22 with 120 count and 0 refills.     Recent Visits  Date Type Provider Dept   01/15/22 Office Visit Jenell Milliner, MD [facility]   12/28/21 Office Visit Deneise Lever, FNP [facility]   Showing recent visits within past 365 days with a meds authorizing provider and meeting all other requirements  Future Appointments  Date Type Provider Dept   05/16/22 Appointment Jenell Milliner, MD [facility] [address] [facility]   Showing future appointments within next 365 days with a meds authorizing provider and meeting all other requirements       Opioid Monitoring   Urine Tox Screen Last Drug Screen Date: 01/15/2022  Opiate Confirmation Test Last Drug Screen Date: 10/21/2019  Last Opioid Dispensed Provider: Jenell Milliner, MD  Prescribed MEDD : 30  Last PDMP Review: 09/24/2021 11:25 AM  Last Opioid 01/15/22  Last Non Opioid Controlled Substance Pain Agreement Signed Date: Not Found    Naloxone Ordered: Not Found  Last OV: Visit date not found

## 2022-03-25 NOTE — Unmapped (Signed)
Pt called requesting refills of percocet. He would like to be called when this rx is sent in.     Please send to new pharmacy, states its cheaper here:       [facility]  [address], [place], [place] [address]

## 2022-04-19 NOTE — Unmapped (Addendum)
Patient calls to request refill on Percocet. Patient states he does not contact the pharmacy first for his refills and only calls the office. Please send script to [facility].    Patient also requesting script be changed to 5/325 mg tablets as other strengths are too expensive.    Please call patient to assist.

## 2022-04-19 NOTE — Unmapped (Signed)
Patient is requesting the following refill  Requested Prescriptions     Pending Prescriptions Disp Refills   ??? oxyCODONE-acetaminophen (PERCOCET) 5-325 mg per tablet 120 tablet 0     Sig: Take 1 tablet by mouth every six (6) hours as needed for pain.       Last refill given on: 03/25/22 with 120 count and 0 refills.     Recent Visits  Date Type Provider Dept   01/15/22 Office Visit Jenell Milliner, MD [facility]   12/28/21 Office Visit Deneise Lever, FNP [facility]   Showing recent visits within past 365 days with a meds authorizing provider and meeting all other requirements  Future Appointments  Date Type Provider Dept   05/16/22 Appointment Jenell Milliner, MD [facility] [address] [facility]   Showing future appointments within next 365 days with a meds authorizing provider and meeting all other requirements       Opioid Monitoring   Urine Tox Screen Last Drug Screen Date: 01/15/2022  Opiate Confirmation Test Last Drug Screen Date: 10/21/2019  Last Opioid Dispensed Provider: Jenell Milliner, MD  Prescribed MEDD : 30  Last PDMP Review: 09/24/2021 11:25 AM 04/19/22  Last Opioid Pain Agreement Signed Date: Not Found  Last Non Opioid Controlled Substance Pain Agreement Signed Date: Not Found    Naloxone Ordered: Not Found  Last OV: Visit date not found

## 2022-04-22 DIAGNOSIS — R39198 Other difficulties with micturition: Principal | ICD-10-CM

## 2022-04-22 MED ORDER — TAMSULOSIN 0.4 MG CAPSULE
ORAL_CAPSULE | 2 refills | 0 days | Status: CP
Start: 2022-04-22 — End: ?

## 2022-04-24 MED ORDER — OXYCODONE-ACETAMINOPHEN 5 MG-325 MG TABLET
ORAL_TABLET | Freq: Four times a day (QID) | ORAL | 0 refills | 30.00000 days | Status: CP | PRN
Start: 2022-04-24 — End: 2022-05-24

## 2022-05-07 NOTE — Unmapped (Unsigned)
Assessment/Plan:    There are no diagnoses linked to this encounter.      No follow-ups on file.    Subjective:     HPI  The patient is seen today for routine follow-up visit.  I last saw the patient January/2023 for annual Medicare wellness visit and the following medical conditions:    -BPH:  History of slow urinary flow- pt started on Flomax 0.4 mg at bedtime.  His symptoms remain stable.  He denies retention symptoms.     - chronic low back pain in the setting of lumbar DDD:  He insisted on keeping his percocet Rx at 120 tabs a month. Review of PDMP reflects compliance.  Annual UDS done in 12/2021, confirmed presence of opioid and he  renewed his annual controlled substance contract at that time.   Review of pt's PDMP reflects med compliance.       - COPD history: pt continue to smoke occasionally.  He has had no chronic SOB symptoms but he was admitted to [facility] hospital for COPD exacerbation in the setting of Rhinovirus infection and CAP.  He has had no formal PFT's.   Most inhalers prescribed in the past have been to costly for the pt.  CXR w/ infiltrate c/w CAP and viral panel positive for rhinovirus. Treated with 5 days of prednisone, cefdinir x 5 days and azithromycin x 3 days, and discharged with spiriva, duonebs, and required home O2 (2L with exertion).  He has been doing well since hospital discharge and he has continued on Spiriva but costly.  I have completed Pt Assistance program application today to get Rx through [facility].   He is currently not on duoneb and he has the albuterol inhaler. He uses rescue inhaler twice daily.  If pt is unable to get Spiriva through pt assistance program, I can restart Duoneb which pt received during hospitalization.     - history of Giant Cell arteritis: he will continue follow up with his rheumatologist as scheduled along with medication regimen as directed. He self administers every 2 weeks Actemra injections and he had a rheumatology follow up visit in 02/2021. He has a history of ischemic optic neuropathy and he was last seen by [facility] in 2019. Pt states he has his routine eye exam at [facility] and is reminded to schedule an appt soon. Last rheumatology encounter from 02/2021 and encounter details were reviewed with the patient.  He is scheduled for annual rheumatology visit in June/2023.     - HTN/CAD/Dyslipidemia history: pt remains normotensive and chest pain free on current medication regimen. His home BP readings are normal.  Creatinine level was 1.15 in January/2023, ALT/AST.  He had essentially normal electrolytes, creatinine and LFT's in 10/2021.  Lipid panel done in 12/2021 was within normal limits with HDL 55 and LDL of 66.  He reports normal home blood pressure readings.  He is currently not followed by cardiology.    -Macrocytic anemia: Noted on serial CBCs in recent past pt states he was a heavy drinker in the past but more recently reduced his daily alcohol consumption to 3 beers a day.  Repeat CBC done in 03/2021 revealed H/H of 12/34 with mildly elevated MCV at 96.7. he has had normal  normal ferritin, iron, folate and vitamin B12 levels in 2021.   Recent CBC done in November/2022 revealed stable H/H at 11/33 with MCV of 99 and normal white blood cell and platelet counts.  TSH was mildly elevated at  5.5 in January/2023 and he is due for repeat today along with free T4 level.      -He was seen at [facility] ED in February/2023 for right elbow ecchymosis/hematoma.  He was treated conservatively with a splint for immobilization and compression and he was advised to follow-up with orthopedics in 1 week time.      ROS  Constitutional:  Denies  unexpected weight loss or gain, or weakness   Eyes:  Denies visual changes  Respiratory:  Denies cough or shortness of breath. No change in exercise  tolerance  Cardiovascular:  Denies chest pain, palpitations or lower extremity swelling   GI:  Denies abdominal pain, diarrhea, constipation Musculoskeletal:  Denies myalgias  Skin:  Denies nonhealing lesions  Neurologic:  Denies headache, focal weakness or numbness, tingling  Endocrine:  Denies polyuria or polydypsia   Psychiatric:  Denies depression, anxiety      Outpatient Medications Prior to Visit   Medication Sig Dispense Refill    albuterol HFA 90 mcg/actuation inhaler Inhale 2 puffs every six (6) hours as needed for wheezing. 9 g 3    amLODIPine (NORVASC) 10 MG tablet Take 1 tablet (10 mg total) by mouth daily. 90 tablet 3    ascorbic acid (VITAMIN C) 500 MG tablet Take 500 mg by mouth daily.      aspirin (ECOTRIN) 81 MG tablet Take 81 mg by mouth daily.      atenoloL (TENORMIN) 50 MG tablet Take 1 tablet (50 mg total) by mouth Two (2) times a day. 180 tablet 3    calcium carbonate (OS-CAL) 1,500 mg (600 mg elem calcium) tablet Take 1 tablet by mouth Two (2) times a day.      calcium-vitamin D 500 mg-5 mcg (200 unit) per tablet Take 1 tablet by mouth daily.      cyanocobalamin, vitamin B-12, 1000 MCG tablet Take 1,000 mcg by mouth daily.      furosemide (LASIX) 20 MG tablet Take 0.5-1 tablets (10-20 mg total) by mouth daily as needed. TAKE 1/2 TO 1 (ONE-HALF TO ONE) TABLET BY MOUTH ONCE DAILY AS NEEDED FOR EDEMA 90 tablet 0    hydrALAZINE (APRESOLINE) 25 MG tablet Take 1 tablet (25 mg total) by mouth daily. 90 tablet 3    losartan (COZAAR) 100 MG tablet Take 1 tablet (100 mg total) by mouth daily. 90 tablet 3    multivitamin (TAB-A-VITE/THERAGRAN) per tablet Take 1 tablet by mouth daily.      mv-mins/folic/lycopene/ginkgo (ONE-A-DAY MEN'S 50+ ADVANTAGE ORAL) Take by mouth.      oxyCODONE-acetaminophen (PERCOCET) 5-325 mg per tablet Take 1 tablet by mouth every six (6) hours as needed for pain. 120 tablet 0    simvastatin (ZOCOR) 10 MG tablet Take 1 tablet (10 mg total) by mouth daily. 90 tablet 3    tamsulosin (FLOMAX) 0.4 mg capsule Take 1 capsule by mouth once daily 90 capsule 2    tiotropium (SPIRIVA WITH HANDIHALER) 18 mcg inhalation capsule Place 1 capsule (18 mcg total) into inhaler and inhale daily. 90 capsule 3    tocilizumab (ACTEMRA) 162 mg/0.9 mL Syrg subcutaneous injection Inject the contents of one syringe (162 mg total) under the skin every fourteen (14) days. 6 mL 3    triamcinolone (KENALOG) 0.1 % cream Apply topically Two (2) times a day. 80 g 1     No facility-administered medications prior to visit.         Objective:       Vital Signs  There  were no vitals taken for this visit.     Exam  General: normal appearance  EYES: Anicteric sclerae.  ENT: Oropharynx moist.  RESP: Relaxed respiratory effort. Clear to auscultation without wheezes or crackles.   CV: Regular rate and rhythm. Normal S1 and S2. No murmurs or gallops.  No lower extremity edema. Posterior tibial pulses are 2+ and symmetric.  abd exam: non tender, no masses, no HSM   MSK: No focal muscle tenderness.  SKIN: Appropriately warm and moist.  NEURO: Stable gait and coordination.    Allergies:     Patient has no known allergies.    Current Medications:     Current Outpatient Medications   Medication Sig Dispense Refill    albuterol HFA 90 mcg/actuation inhaler Inhale 2 puffs every six (6) hours as needed for wheezing. 9 g 3    amLODIPine (NORVASC) 10 MG tablet Take 1 tablet (10 mg total) by mouth daily. 90 tablet 3    ascorbic acid (VITAMIN C) 500 MG tablet Take 500 mg by mouth daily.      aspirin (ECOTRIN) 81 MG tablet Take 81 mg by mouth daily.      atenoloL (TENORMIN) 50 MG tablet Take 1 tablet (50 mg total) by mouth Two (2) times a day. 180 tablet 3    calcium carbonate (OS-CAL) 1,500 mg (600 mg elem calcium) tablet Take 1 tablet by mouth Two (2) times a day.      calcium-vitamin D 500 mg-5 mcg (200 unit) per tablet Take 1 tablet by mouth daily.      cyanocobalamin, vitamin B-12, 1000 MCG tablet Take 1,000 mcg by mouth daily.      furosemide (LASIX) 20 MG tablet Take 0.5-1 tablets (10-20 mg total) by mouth daily as needed. TAKE 1/2 TO 1 (ONE-HALF TO ONE) TABLET BY MOUTH ONCE DAILY AS NEEDED FOR EDEMA 90 tablet 0    hydrALAZINE (APRESOLINE) 25 MG tablet Take 1 tablet (25 mg total) by mouth daily. 90 tablet 3    losartan (COZAAR) 100 MG tablet Take 1 tablet (100 mg total) by mouth daily. 90 tablet 3    multivitamin (TAB-A-VITE/THERAGRAN) per tablet Take 1 tablet by mouth daily.      mv-mins/folic/lycopene/ginkgo (ONE-A-DAY MEN'S 50+ ADVANTAGE ORAL) Take by mouth.      oxyCODONE-acetaminophen (PERCOCET) 5-325 mg per tablet Take 1 tablet by mouth every six (6) hours as needed for pain. 120 tablet 0    simvastatin (ZOCOR) 10 MG tablet Take 1 tablet (10 mg total) by mouth daily. 90 tablet 3    tamsulosin (FLOMAX) 0.4 mg capsule Take 1 capsule by mouth once daily 90 capsule 2    tiotropium (SPIRIVA WITH HANDIHALER) 18 mcg inhalation capsule Place 1 capsule (18 mcg total) into inhaler and inhale daily. 90 capsule 3    tocilizumab (ACTEMRA) 162 mg/0.9 mL Syrg subcutaneous injection Inject the contents of one syringe (162 mg total) under the skin every fourteen (14) days. 6 mL 3    triamcinolone (KENALOG) 0.1 % cream Apply topically Two (2) times a day. 80 g 1     No current facility-administered medications for this visit.           Note - This record has been created using [employer]. Chart creation errors have been sought, but may not always have been located. Such creation errors do not reflect on the standard of medical care.    Jenell Milliner, MD

## 2022-05-21 NOTE — Unmapped (Signed)
Pt states he is calling for a refill on his medication. Please contact and advise when it has been refilled.     oxyCODONE-acetaminophen (PERCOCET) 5-325 mg per tablet [[id number]]     Pharmacy    [facility] [address] [place] - [address]   [address] [place] [address]   Phone:  [phone] ??Fax:  [phone]

## 2022-05-23 DIAGNOSIS — J449 Chronic obstructive pulmonary disease, unspecified: Principal | ICD-10-CM

## 2022-05-23 MED ORDER — OXYCODONE-ACETAMINOPHEN 5 MG-325 MG TABLET
ORAL_TABLET | Freq: Four times a day (QID) | ORAL | 0 refills | 30 days | Status: CP | PRN
Start: 2022-05-23 — End: 2022-06-22

## 2022-05-23 NOTE — Unmapped (Signed)
Patient called again regarding his refill. Patient states he will be out of medication tomorrow and needs this medication.  Please call patient to assist

## 2022-06-10 NOTE — Unmapped (Signed)
Assessment/Plan:    Erik [name] was seen today for follow-up.    Diagnoses and all orders for this visit:    Giant cell arteritis (CMS-HCC)    Essential hypertension, benign  -     amLODIPine (NORVASC) 10 MG tablet; Take 1 tablet (10 mg total) by mouth daily.  -     atenoloL (TENORMIN) 50 MG tablet; Take 1 tablet (50 mg total) by mouth Two (2) times a day.  -     hydrALAZINE (APRESOLINE) 25 MG tablet; Take 1 tablet (25 mg total) by mouth daily.  -     losartan (COZAAR) 100 MG tablet; Take 1 tablet (100 mg total) by mouth daily.  -     Comprehensive Metabolic Panel    Coronary artery disease involving autologous vein coronary bypass graft without angina pectoris  -     simvastatin (ZOCOR) 10 MG tablet; Take 1 tablet (10 mg total) by mouth daily.    Slow urinary stream  -     tamsulosin (FLOMAX) 0.4 mg capsule; Take 1 capsule (0.4 mg total) by mouth daily.    Chronic obstructive pulmonary disease, unspecified COPD type (CMS-HCC)  -     albuterol HFA 90 mcg/actuation inhaler; Inhale 2 puffs every six (6) hours as needed for wheezing.    DDD (degenerative disc disease), lumbar    Benign prostatic hyperplasia, unspecified whether lower urinary tract symptoms present    Chronic pain syndrome    Dyslipidemia  -     Comprehensive Metabolic Panel    Macrocytosis without anemia  -     CBC    Borderline abnormal thyroid function test  -     TSH  -     T4, Free      -BPH:  History of slow urinary flow- pt started on Flomax 0.4 mg at bedtime.  His symptoms remain stable.  He denies retention symptoms.     - chronic low back pain in the setting of lumbar DDD:  He insisted on keeping his percocet Rx at 120 tabs a month. Review of PDMP reflects compliance.  Annual UDS done in 12/2021, confirmed presence of opioid and he  renewed his annual controlled substance contract at that time.   Review of pt's PDMP reflects med compliance.   He will be due for percocet Rx by end of week.     - COPD history: pt continue to smoke occasionally.  He has had no chronic SOB symptoms but he was admitted to [facility] hospital in 10/2021 for COPD exacerbation in the setting of Rhinovirus infection and CAP.  He has had no formal PFT's.   Most inhalers prescribed in the past have been to costly for the pt.  CXR w/ infiltrate c/w CAP and viral panel positive for rhinovirus. Treated with 5 days of prednisone, cefdinir x 5 days and azithromycin x 3 days, and discharged with spiriva, duonebs, and required home O2 (2L with exertion).  He has been doing well since hospital discharge and he has continued on Spiriva but costly.  I have completed Pt Assistance program application last year to get Rx through [facility]. This was never approved and he stopped it due to cost.   He is currently not on duoneb and he has the albuterol inhaler. He never refilled albuterol inhaler and he has not needed to use it.  New rx sent to his pharmacy.    - history of Giant Cell arteritis: he will continue follow up with his rheumatologist  as scheduled along with Actemra self administered injections regimen  every 2 weeks He had a rheumatology follow up visit in 02/2021. He has a history of ischemic optic neuropathy and he was last seen by [facility] in 2019. Pt states he has his routine eye exam at [facility] optometry and is reminded to schedule an appt soon. Last rheumatology encounter from 02/2021 and encounter details were reviewed with the patient.  He is scheduled for annual rheumatology visit schedule for 07/18/2022.      - HTN/CAD/Dyslipidemia history: pt remains normotensive and chest pain free on current medication regimen. His home BP readings are normal. He reports no dizziness or lightheadedness.  Creatinine level was 1.15 in January/2023, ALT/AST.  He had essentially normal electrolytes, creatinine and LFT's in 10/2021.  Lipid panel done in 12/2021 was within normal limits with HDL 55 and LDL of 66.  He reports normal home blood pressure readings. He is due for CMP today.   He is currently not followed by cardiology.    -Macrocytic anemia: Noted on serial CBCs in recent past pt states he was a heavy drinker in the past but more recently reduced his daily alcohol consumption to 3 beers a day.  Repeat CBC done in 03/2021 revealed H/H of 12/34 with mildly elevated MCV at 96.7. he has had normal  normal ferritin, iron, folate and vitamin B12 levels in 2021.   Recent CBC done in November/2022 revealed stable H/H at 11/33 with MCV of 99 and normal white blood cell and platelet counts.  TSH was mildly elevated at 5.5 in January/2023 and he is due for repeat CBC, TSH today along with free T4 level.        I personally spent 40 minutes, face to face, and non face to face, in the care of this patient, which includes pre , intra and post visit time on date of service.  All documented time is specific to EM and dose not include any procedures performed today.    Return in about 4 months (around 10/17/2022) for Recheck.    Subjective:     HPI  The patient is seen today for routine follow-up visit.  I last saw the patient January/2023 for annual Medicare wellness visit and the following medical conditions:    -BPH:  History of slow urinary flow- pt started on Flomax 0.4 mg at bedtime.  His symptoms remain stable.  He denies retention symptoms.     - chronic low back pain in the setting of lumbar DDD:  He insisted on keeping his percocet Rx at 120 tabs a month. Review of PDMP reflects compliance.  Annual UDS done in 12/2021, confirmed presence of opioid and he  renewed his annual controlled substance contract at that time.   Review of pt's PDMP reflects med compliance.   He will be due for percocet Rx by end of week.     - COPD history: pt continue to smoke occasionally.  He has had no chronic SOB symptoms but he was admitted to [facility] hospital in 10/2021 for COPD exacerbation in the setting of Rhinovirus infection and CAP.  He has had no formal PFT's.   Most inhalers prescribed in the past have been to costly for the pt.  CXR w/ infiltrate c/w CAP and viral panel positive for rhinovirus. Treated with 5 days of prednisone, cefdinir x 5 days and azithromycin x 3 days, and discharged with spiriva, duonebs, and required home O2 (2L with exertion).  He has been doing  well since hospital discharge and he has continued on Spiriva but costly.  I have completed Pt Assistance program application last year to get Rx through [facility]. This was never approved and he stopped it due to cost.   He is currently not on duoneb and he has the albuterol inhaler. He never refilled albuterol inhaler and he has not needed to use it.  New rx sent to his pharmacy.    - history of Giant Cell arteritis: he will continue follow up with his rheumatologist as scheduled along with Actemra self administered injections regimen  every 2 weeks He had a rheumatology follow up visit in 02/2021. He has a history of ischemic optic neuropathy and he was last seen by [facility] in 2019. Pt states he has his routine eye exam at [facility] optometry and is reminded to schedule an appt soon. Last rheumatology encounter from 02/2021 and encounter details were reviewed with the patient.  He is scheduled for annual rheumatology visit schedule for 07/18/2022.      - HTN/CAD/Dyslipidemia history: pt remains normotensive and chest pain free on current medication regimen. His home BP readings are normal. He reports no dizziness or lightheadedness.  Creatinine level was 1.15 in January/2023, ALT/AST.  He had essentially normal electrolytes, creatinine and LFT's in 10/2021.  Lipid panel done in 12/2021 was within normal limits with HDL 55 and LDL of 66.  He reports normal home blood pressure readings. He is due for CMP today.   He is currently not followed by cardiology.    -Macrocytic anemia: Noted on serial CBCs in recent past pt states he was a heavy drinker in the past but more recently reduced his daily alcohol consumption to 3 beers a day.  Repeat CBC done in 03/2021 revealed H/H of 12/34 with mildly elevated MCV at 96.7. he has had normal  normal ferritin, iron, folate and vitamin B12 levels in 2021.   Recent CBC done in November/2022 revealed stable H/H at 11/33 with MCV of 99 and normal white blood cell and platelet counts.  TSH was mildly elevated at 5.5 in January/2023 and he is due for repeat CBC, TSH today along with free T4 level.      -He was seen at [facility] ED in February/2023 for right elbow ecchymosis/hematoma.  He was treated conservatively with a splint for immobilization and compression and he was advised to follow-up with orthopedics in 1 week time. Area has healed.       ROS  Constitutional:  Denies  unexpected weight loss or gain, or weakness   Eyes:  Denies visual changes  Respiratory:  Denies cough or shortness of breath. No change in exercise  tolerance  Cardiovascular:  Denies chest pain, palpitations or lower extremity swelling   GI:  Denies abdominal pain, diarrhea, constipation   Musculoskeletal:  Denies myalgias  Skin:  Denies nonhealing lesions  Neurologic:  Denies headache, focal weakness or numbness, tingling  Endocrine:  Denies polyuria or polydypsia   Psychiatric:  Denies depression, anxiety      Outpatient Medications Prior to Visit   Medication Sig Dispense Refill    ascorbic acid (VITAMIN C) 500 MG tablet Take 1 tablet (500 mg total) by mouth daily.      aspirin (ECOTRIN) 81 MG tablet Take 1 tablet (81 mg total) by mouth daily.      calcium carbonate (OS-CAL) 1,500 mg (600 mg elem calcium) tablet Take 1 tablet (600 mg of elem calcium total) by mouth Two (2)  times a day.      calcium-vitamin D 500 mg-5 mcg (200 unit) per tablet Take 1 tablet by mouth daily.      cyanocobalamin, vitamin B-12, 1000 MCG tablet Take 1 tablet (1,000 mcg total) by mouth daily.      furosemide (LASIX) 20 MG tablet Take 0.5-1 tablets (10-20 mg total) by mouth daily as needed. TAKE 1/2 TO 1 (ONE-HALF TO ONE) TABLET BY MOUTH ONCE DAILY AS NEEDED FOR EDEMA 90 tablet 0    multivitamin (TAB-A-VITE/THERAGRAN) per tablet Take 1 tablet by mouth daily.      mv-mins/folic/lycopene/ginkgo (ONE-A-DAY MEN'S 50+ ADVANTAGE ORAL) Take by mouth.      oxyCODONE-acetaminophen (PERCOCET) 5-325 mg per tablet Take 1 tablet by mouth every six (6) hours as needed for pain. 120 tablet 0    tocilizumab (ACTEMRA) 162 mg/0.9 mL Syrg subcutaneous injection Inject the contents of one syringe (162 mg total) under the skin every fourteen (14) days. 6 mL 3    triamcinolone (KENALOG) 0.1 % cream Apply topically Two (2) times a day. 80 g 1    albuterol HFA 90 mcg/actuation inhaler Inhale 2 puffs every six (6) hours as needed for wheezing. 9 g 3    amLODIPine (NORVASC) 10 MG tablet Take 1 tablet (10 mg total) by mouth daily. 90 tablet 3    atenoloL (TENORMIN) 50 MG tablet Take 1 tablet (50 mg total) by mouth Two (2) times a day. 180 tablet 3    hydrALAZINE (APRESOLINE) 25 MG tablet Take 1 tablet (25 mg total) by mouth daily. 90 tablet 3    losartan (COZAAR) 100 MG tablet Take 1 tablet (100 mg total) by mouth daily. 90 tablet 3    simvastatin (ZOCOR) 10 MG tablet Take 1 tablet (10 mg total) by mouth daily. 90 tablet 3    tamsulosin (FLOMAX) 0.4 mg capsule Take 1 capsule by mouth once daily 90 capsule 2    tiotropium (SPIRIVA WITH HANDIHALER) 18 mcg inhalation capsule Place 1 capsule (18 mcg total) into inhaler and inhale daily. (Patient not taking: Reported on 06/17/2022) 90 capsule 3     No facility-administered medications prior to visit.         Objective:       Vital Signs  BP 130/52  - Pulse 65  - Temp 36.8 ??C (98.2 ??F)  - Wt 62.5 kg (137 lb 11.2 oz)  - SpO2 92%  - BMI 25.19 kg/m??      Exam  General: normal appearance  EYES: Anicteric sclerae.  ENT: Oropharynx moist.  RESP: Relaxed respiratory effort. Clear to auscultation without wheezes or crackles.   CV: Regular rate and rhythm. Normal S1 and S2. No murmurs or gallops.  No lower extremity edema. Posterior tibial pulses are 2+ and symmetric.  abd exam: non tender, no masses, no HSM   MSK: No focal muscle tenderness.  SKIN: Appropriately warm and moist.  NEURO: Stable gait and coordination.    Allergies:     Patient has no known allergies.    Current Medications:     Current Outpatient Medications   Medication Sig Dispense Refill    ascorbic acid (VITAMIN C) 500 MG tablet Take 1 tablet (500 mg total) by mouth daily.      aspirin (ECOTRIN) 81 MG tablet Take 1 tablet (81 mg total) by mouth daily.      calcium carbonate (OS-CAL) 1,500 mg (600 mg elem calcium) tablet Take 1 tablet (600 mg of elem calcium total) by mouth Two (  2) times a day.      calcium-vitamin D 500 mg-5 mcg (200 unit) per tablet Take 1 tablet by mouth daily.      cyanocobalamin, vitamin B-12, 1000 MCG tablet Take 1 tablet (1,000 mcg total) by mouth daily.      furosemide (LASIX) 20 MG tablet Take 0.5-1 tablets (10-20 mg total) by mouth daily as needed. TAKE 1/2 TO 1 (ONE-HALF TO ONE) TABLET BY MOUTH ONCE DAILY AS NEEDED FOR EDEMA 90 tablet 0    multivitamin (TAB-A-VITE/THERAGRAN) per tablet Take 1 tablet by mouth daily.      mv-mins/folic/lycopene/ginkgo (ONE-A-DAY MEN'S 50+ ADVANTAGE ORAL) Take by mouth.      oxyCODONE-acetaminophen (PERCOCET) 5-325 mg per tablet Take 1 tablet by mouth every six (6) hours as needed for pain. 120 tablet 0    tocilizumab (ACTEMRA) 162 mg/0.9 mL Syrg subcutaneous injection Inject the contents of one syringe (162 mg total) under the skin every fourteen (14) days. 6 mL 3    triamcinolone (KENALOG) 0.1 % cream Apply topically Two (2) times a day. 80 g 1    albuterol HFA 90 mcg/actuation inhaler Inhale 2 puffs every six (6) hours as needed for wheezing. 9 g 3    amLODIPine (NORVASC) 10 MG tablet Take 1 tablet (10 mg total) by mouth daily. 90 tablet 3    atenoloL (TENORMIN) 50 MG tablet Take 1 tablet (50 mg total) by mouth Two (2) times a day. 180 tablet 3    hydrALAZINE (APRESOLINE) 25 MG tablet Take 1 tablet (25 mg total) by mouth daily. 90 tablet 3    losartan (COZAAR) 100 MG tablet Take 1 tablet (100 mg total) by mouth daily. 90 tablet 3    simvastatin (ZOCOR) 10 MG tablet Take 1 tablet (10 mg total) by mouth daily. 90 tablet 3    tamsulosin (FLOMAX) 0.4 mg capsule Take 1 capsule (0.4 mg total) by mouth daily. 90 capsule 3     No current facility-administered medications for this visit.           Note - This record has been created using [employer]. Chart creation errors have been sought, but may not always have been located. Such creation errors do not reflect on the standard of medical care.    Jenell Milliner, MD

## 2022-06-13 NOTE — Unmapped (Unsigned)
Rheumatology return visit    REASON FOR VISIT: F/U GCA    HISTORY: Erik [name] is a 88 y.o. male smoker with h/o COPD, CAD s/p CABG, HTN, ongoing alcohol use and GIB 2/2 duodenal ulcer (likely 2/2 concurrent ibuprofen, alcohol and prednisone use) presenting for follow-up of biopsy proven giant cell arteritis complicated by ischemic optic neuropathy/ vision loss in left eye.     Last seen 10/30/21 via telemedicine with myself.     He is taking Actemra 162 mg SQ weekly (takes every Thursday).      Interim History:     Actemra?    GCA symptoms?    Fever?  Illness?  Infections?  Wt loss?      CURRENT MEDICATIONS:  Current Outpatient Medications   Medication Sig Dispense Refill    albuterol HFA 90 mcg/actuation inhaler Inhale 2 puffs every six (6) hours as needed for wheezing. 9 g 3    amLODIPine (NORVASC) 10 MG tablet Take 1 tablet (10 mg total) by mouth daily. 90 tablet 3    ascorbic acid (VITAMIN C) 500 MG tablet Take 500 mg by mouth daily.      aspirin (ECOTRIN) 81 MG tablet Take 81 mg by mouth daily.      atenoloL (TENORMIN) 50 MG tablet Take 1 tablet (50 mg total) by mouth Two (2) times a day. 180 tablet 3    calcium carbonate (OS-CAL) 1,500 mg (600 mg elem calcium) tablet Take 1 tablet by mouth Two (2) times a day.      calcium-vitamin D 500 mg-5 mcg (200 unit) per tablet Take 1 tablet by mouth daily.      cyanocobalamin, vitamin B-12, 1000 MCG tablet Take 1,000 mcg by mouth daily.      furosemide (LASIX) 20 MG tablet Take 0.5-1 tablets (10-20 mg total) by mouth daily as needed. TAKE 1/2 TO 1 (ONE-HALF TO ONE) TABLET BY MOUTH ONCE DAILY AS NEEDED FOR EDEMA 90 tablet 0    hydrALAZINE (APRESOLINE) 25 MG tablet Take 1 tablet (25 mg total) by mouth daily. 90 tablet 3    losartan (COZAAR) 100 MG tablet Take 1 tablet (100 mg total) by mouth daily. 90 tablet 3    multivitamin (TAB-A-VITE/THERAGRAN) per tablet Take 1 tablet by mouth daily.      mv-mins/folic/lycopene/ginkgo (ONE-A-DAY MEN'S 50+ ADVANTAGE ORAL) Take by mouth.      oxyCODONE-acetaminophen (PERCOCET) 5-325 mg per tablet Take 1 tablet by mouth every six (6) hours as needed for pain. 120 tablet 0    simvastatin (ZOCOR) 10 MG tablet Take 1 tablet (10 mg total) by mouth daily. 90 tablet 3    tamsulosin (FLOMAX) 0.4 mg capsule Take 1 capsule by mouth once daily 90 capsule 2    tiotropium (SPIRIVA WITH HANDIHALER) 18 mcg inhalation capsule Place 1 capsule (18 mcg total) into inhaler and inhale daily. 90 capsule 3    tocilizumab (ACTEMRA) 162 mg/0.9 mL Syrg subcutaneous injection Inject the contents of one syringe (162 mg total) under the skin every fourteen (14) days. 6 mL 3    triamcinolone (KENALOG) 0.1 % cream Apply topically Two (2) times a day. 80 g 1     No current facility-administered medications for this visit.       Past Medical History:   Diagnosis Date    Cataract 2005    cataract surgery    COPD (chronic obstructive pulmonary disease) (CMS-HCC) 10/07/2016    DJD (degenerative joint disease), lumbar     Hypertension  Mild anemia 01/15/2022    Tobacco abuse 02/15/2014        Record Review: Available records were reviewed, including pertinent office visits, labs, and imaging.      REVIEW OF SYSTEMS: Ten system were reviewed and negative except as noted above.    PHYSICAL EXAM:***  VITAL SIGNS: There were no vitals filed for this visit.  General:   Pleasant 88 y.o.male in no acute distress, WDWN   Eyes:   PERRL, conjunctiva and sclera not inflamed. Tears appear adequate.    ENT:   No oropharyngeal lesions. Mucous membranes moist.    Lymph:   No masses or cervical lymphadenopathy.    Cardiovascular:  Regular rate and rhythm. No murmur, rub, or gallop. No lower extremity edema.    Lungs:  Clear to auscultation.Normal respiratory effort.    Musculoskeletal:   General: Ambulates w/o assistance   Hands: No swelling or tenderness. Able to make a tight fist b/l   Wrists:FROM w/o swelling or tenderness   Elbows: FROM w/o swelling or tenderness   Shoulders: FROM w/o pain Hips: FROM w/o pain   Knees: FROM w/o effusions   Ankles: No swelling or tenderness   Feet: No pain with MTP squeeze    Neurological:  CN 2-12 grossly intact. 5/5 strength on extremities.   Psych:  Appropriate affect and mood   Skin:  No rashes.         ASSESSMENT/PLAN:***  88 y.o. smoker with h/o COPD, CAD s/p CABG,  HTN, active alcohol use, GIB 2/2 duodenal ulcer (likely 2/2 concurrent ibuprofen, alcohol and prednisone use) presenting for follow-up of  biopsy proven giant cell arteritis  complicated by ischemic optic neuropathy/ vision loss in left eye (dx 11/2017)     He was last seen 10/30/21 as telemedicine visit with myself.     ***     Currently taking tocilizumab 162 mg SQ q other week and tolerating without any side effects. Pt was recently hospitalized for COPD exacerbation, symptoms improving today and will complete ABX Wednesday, no fever since discharge. Scheduled to take Actemra Thursday. Discussed delaying this injection but pt resistant. Symptoms from GCA standpoint are overall stable. Continues to have L eye vision loss which has been present since 2018. No new vision changes, headaches, temporal artery tenderness, scalp tenderness, jaw pain/claudication, or shoulder/hip stiffness.     - Continue tocilizumab 162 mg subcutaneous q2weeks.   - Update lab monitoring: CBCw/diff, Crt, AST, ALT, ESR, CRP. Pt will complete next month at PCP visit.   -  Of note, patient with known h/o GIB/duodenal ulcer which does increase risk of GI perforation on tocilizumab - however, he is now >18 months out from this without any complications and wishes to continue treatment with tocilizumab  - Continue F/U with PCP regarding COPD    HCM:   - PCV13 Status:04/06/2015  - PPSV 23 Status:12/23/2009  - COVID-19 vaccine status: ***  - Annual Influenza vaccine. Status: UTD  - Bone health: not on prednisone       F/U in 4 months with Dr. Ilsa Iha    I personally spent *** minutes face-to-face and non-face-to-face in the care of this patient, which includes all pre, intra, and post visit time on the date of service.  All documented time was specific to the E/M visit and does not include any procedures that may have been performed.

## 2022-06-17 ENCOUNTER — Ambulatory Visit: Admit: 2022-06-17 | Discharge: 2022-06-18 | Payer: MEDICARE

## 2022-06-17 DIAGNOSIS — M316 Other giant cell arteritis: Principal | ICD-10-CM

## 2022-06-17 DIAGNOSIS — M5136 Other intervertebral disc degeneration, lumbar region: Principal | ICD-10-CM

## 2022-06-17 DIAGNOSIS — J449 Chronic obstructive pulmonary disease, unspecified: Principal | ICD-10-CM

## 2022-06-17 DIAGNOSIS — G894 Chronic pain syndrome: Principal | ICD-10-CM

## 2022-06-17 DIAGNOSIS — E785 Hyperlipidemia, unspecified: Principal | ICD-10-CM

## 2022-06-17 DIAGNOSIS — D7589 Other specified diseases of blood and blood-forming organs: Principal | ICD-10-CM

## 2022-06-17 DIAGNOSIS — I10 Essential (primary) hypertension: Principal | ICD-10-CM

## 2022-06-17 DIAGNOSIS — R946 Abnormal results of thyroid function studies: Principal | ICD-10-CM

## 2022-06-17 DIAGNOSIS — R39198 Other difficulties with micturition: Principal | ICD-10-CM

## 2022-06-17 DIAGNOSIS — N400 Enlarged prostate without lower urinary tract symptoms: Principal | ICD-10-CM

## 2022-06-17 DIAGNOSIS — I25810 Atherosclerosis of coronary artery bypass graft(s) without angina pectoris: Principal | ICD-10-CM

## 2022-06-17 LAB — COMPREHENSIVE METABOLIC PANEL
ALBUMIN: 4.3 g/dL (ref 3.4–5.0)
ALKALINE PHOSPHATASE: 54 U/L (ref 46–116)
ALT (SGPT): 16 U/L (ref 10–49)
ANION GAP: 9 mmol/L (ref 5–14)
AST (SGOT): 22 U/L (ref <=34)
BILIRUBIN TOTAL: 0.5 mg/dL (ref 0.3–1.2)
BLOOD UREA NITROGEN: 18 mg/dL (ref 9–23)
BUN / CREAT RATIO: 12
CALCIUM: 9.4 mg/dL (ref 8.7–10.4)
CHLORIDE: 104 mmol/L (ref 98–107)
CO2: 25 mmol/L (ref 20.0–31.0)
CREATININE: 1.45 mg/dL — ABNORMAL HIGH
EGFR CKD-EPI (2021) MALE: 47 mL/min/{1.73_m2} — ABNORMAL LOW (ref >=60)
GLUCOSE RANDOM: 98 mg/dL (ref 70–179)
POTASSIUM: 4.3 mmol/L (ref 3.4–4.8)
PROTEIN TOTAL: 6.4 g/dL (ref 5.7–8.2)
SODIUM: 138 mmol/L (ref 135–145)

## 2022-06-17 LAB — CBC
HEMATOCRIT: 32.9 % — ABNORMAL LOW (ref 39.0–48.0)
HEMOGLOBIN: 11.3 g/dL — ABNORMAL LOW (ref 12.9–16.5)
MEAN CORPUSCULAR HEMOGLOBIN CONC: 34.5 g/dL (ref 32.0–36.0)
MEAN CORPUSCULAR HEMOGLOBIN: 33.4 pg — ABNORMAL HIGH (ref 25.9–32.4)
MEAN CORPUSCULAR VOLUME: 96.8 fL — ABNORMAL HIGH (ref 77.6–95.7)
MEAN PLATELET VOLUME: 8.6 fL (ref 6.8–10.7)
PLATELET COUNT: 183 10*9/L (ref 150–450)
RED BLOOD CELL COUNT: 3.39 10*12/L — ABNORMAL LOW (ref 4.26–5.60)
RED CELL DISTRIBUTION WIDTH: 14.5 % (ref 12.2–15.2)
WBC ADJUSTED: 8.3 10*9/L (ref 3.6–11.2)

## 2022-06-17 LAB — T4, FREE: FREE T4: 1.5 ng/dL (ref 0.89–1.76)

## 2022-06-17 LAB — TSH: THYROID STIMULATING HORMONE: 3.865 u[IU]/mL (ref 0.550–4.780)

## 2022-06-17 MED ORDER — ATENOLOL 50 MG TABLET
ORAL_TABLET | Freq: Two times a day (BID) | ORAL | 3 refills | 90 days | Status: CP
Start: 2022-06-17 — End: 2023-06-17

## 2022-06-17 MED ORDER — AMLODIPINE 10 MG TABLET
ORAL_TABLET | Freq: Every day | ORAL | 3 refills | 90 days | Status: CP
Start: 2022-06-17 — End: ?

## 2022-06-17 MED ORDER — TAMSULOSIN 0.4 MG CAPSULE
ORAL_CAPSULE | Freq: Every day | ORAL | 3 refills | 90 days | Status: CP
Start: 2022-06-17 — End: ?

## 2022-06-17 MED ORDER — LOSARTAN 100 MG TABLET
ORAL_TABLET | Freq: Every day | ORAL | 3 refills | 90 days | Status: CP
Start: 2022-06-17 — End: 2023-06-17

## 2022-06-17 MED ORDER — ALBUTEROL SULFATE HFA 90 MCG/ACTUATION AEROSOL INHALER
Freq: Four times a day (QID) | RESPIRATORY_TRACT | 3 refills | 0 days | Status: CP | PRN
Start: 2022-06-17 — End: 2023-06-17

## 2022-06-17 MED ORDER — HYDRALAZINE 25 MG TABLET
ORAL_TABLET | Freq: Every day | ORAL | 3 refills | 90.00000 days | Status: CP
Start: 2022-06-17 — End: 2023-06-17

## 2022-06-17 MED ORDER — SIMVASTATIN 10 MG TABLET
ORAL_TABLET | Freq: Every day | ORAL | 3 refills | 90 days | Status: CP
Start: 2022-06-17 — End: 2023-06-18

## 2022-06-18 LAB — FERRITIN: FERRITIN: 109 ng/mL

## 2022-06-18 LAB — IRON: IRON: 132 ug/dL

## 2022-06-18 LAB — VITAMIN B12: VITAMIN B-12: 908 pg/mL (ref 211–911)

## 2022-06-18 NOTE — Unmapped (Signed)
Addended by: Jenell Milliner on: 06/18/2022 07:30 AM     Modules accepted: Orders

## 2022-06-18 NOTE — Unmapped (Signed)
Notify the patient that CMP result reveals elevated kidney function/creatinine of 1.45.  Other CMP results are normal.  I recommend he reduce losartan dose to half of a 100 mg tablet daily.  He will need nurse and lab visit in 2 weeks for BP check and repeat serum creatinine level.  Future lab order done.  Repeat TSH and free T4 results normal.  CBC remains stable with mild macrocytic anemia noted.  Add on lab request for iron/ferritin/vitamin B12 and folate levels-done and I will contact the patient with these results.

## 2022-06-18 NOTE — Unmapped (Signed)
Ferritin, iron and  Vit B12 levels are normal. Stann Mainland continue to monitor CBC

## 2022-06-20 NOTE — Unmapped (Signed)
duplicate

## 2022-06-20 NOTE — Unmapped (Signed)
Patient is requesting the following refill  Requested Prescriptions     Pending Prescriptions Disp Refills    oxyCODONE-acetaminophen (PERCOCET) 5-325 mg per tablet 120 tablet 0     Sig: Take 1 tablet by mouth every six (6) hours as needed for pain.       Recent Visits  Date Type Provider Dept   06/17/22 Office Visit Jenell Milliner, MD [facility] [address] [facility]   01/15/22 Office Visit Jenell Milliner, MD [place] [facility]   12/28/21 Office Visit Deneise Lever, FNP [facility] [facility]   Showing recent visits within past 365 days with a meds authorizing provider and meeting all other requirements  Future Appointments  Date Type Provider Dept   10/17/22 Appointment Jenell Milliner, MD [place] [facility] [address] [facility]   Showing future appointments within next 365 days with a meds authorizing provider and meeting all other requirements     Patient is requesting the following refill  Requested Prescriptions     Pending Prescriptions Disp Refills    oxyCODONE-acetaminophen (PERCOCET) 5-325 mg per tablet 120 tablet 0     Sig: Take 1 tablet by mouth every six (6) hours as needed for pain.       Last refill given on: 05/23/2022 with 120 count and 0 refills.     Recent Visits  Date Type Provider Dept   06/17/22 Office Visit Jenell Milliner, MD [place] [facility] [address] [facility]   01/15/22 Office Visit Jenell Milliner, MD [place] [facility]   12/28/21 Office Visit Deneise Lever, FNP [place] [facility]   Showing recent visits within past 365 days with a meds authorizing provider and meeting all other requirements  Future Appointments  Date Type Provider Dept   10/17/22 Appointment Jenell Milliner, MD [place] [facility] [address] [facility]   Showing future appointments within next 365 days with a meds authorizing provider and meeting all other requirements       Opioid Monitoring   Urine Tox Screen Last Drug Screen Date: 01/15/2022  Opiate Confirmation Test Last Drug Screen Date: 10/21/2019  Last Opioid Dispensed Provider: Desmond Dike, NP  Prescribed MEDD : 30  Last PDMP Review: 05/23/2022  2:55 PM  Last Opioid controlled substance pain agreement signed date:01/15/2022  Last Non Opioid Controlled Substance Pain Agreement Signed Date: Not Found    Naloxone Ordered: Not Found  Last OV: 06/17/2022

## 2022-06-20 NOTE — Unmapped (Signed)
Addended by: Heron Sabins on: 06/20/2022 02:55 PM     Modules accepted: Orders

## 2022-06-20 NOTE — Unmapped (Signed)
Patient is requesting the following medication(s) for refill: oxyCODONE-acetaminophen (PERCOCET) 5-325 mg  and a quantity for 1 month supply    Pharmacy name and address:   [id number] - [place], [place] -       Please contact patient by Cell Phone

## 2022-06-22 MED ORDER — OXYCODONE-ACETAMINOPHEN 5 MG-325 MG TABLET
ORAL_TABLET | Freq: Four times a day (QID) | ORAL | 0 refills | 30 days | Status: CP | PRN
Start: 2022-06-22 — End: 2022-07-22

## 2022-06-27 NOTE — Unmapped (Signed)
Patient states he received a call from the office on Tuesday stating he needs to come into the office in 1 week for a BP check and lab work. He needs to come in on Monday 07/01/22 because his grandchild is off from work and able to provide transportation.    Patient states he was told to call the office and tell someone when he would be available to come in and that no appointment is needed. Per One Note, both of these things need to be scheduled.    Please call patient to discuss and schedule.

## 2022-07-01 ENCOUNTER — Other Ambulatory Visit: Admit: 2022-07-01 | Discharge: 2022-07-02 | Payer: MEDICARE

## 2022-07-01 DIAGNOSIS — R7989 Other specified abnormal findings of blood chemistry: Principal | ICD-10-CM

## 2022-07-01 DIAGNOSIS — D7589 Other specified diseases of blood and blood-forming organs: Principal | ICD-10-CM

## 2022-07-01 LAB — FOLATE: FOLATE: >24 ng/mL (ref >=5.4)

## 2022-07-01 LAB — CREATININE
CREATININE: 1.35 mg/dL — ABNORMAL HIGH
EGFR CKD-EPI (2021) MALE: 51 mL/min/{1.73_m2} — ABNORMAL LOW (ref >=60)

## 2022-07-01 NOTE — Unmapped (Signed)
Patient was in today for labs and a BP after reducing his hypertension medication 2 weeks ago. Here are his home readings: 135/52 this AM, 120/47 after taking his medication. He is concerned about the bottom number being so low. Please call and advise him. Sct.

## 2022-07-02 NOTE — Unmapped (Signed)
Patient notified of results.

## 2022-07-02 NOTE — Unmapped (Signed)
Notify the pt that his kidney function is improved a bit with reduction of losartan dose. How is his BP doing?  I will continue to monitor his renal function closely.

## 2022-07-02 NOTE — Unmapped (Signed)
Patient returning a call regarding lab results  Please call patient to assist

## 2022-07-18 MED ORDER — OXYCODONE-ACETAMINOPHEN 5 MG-325 MG TABLET
ORAL_TABLET | Freq: Four times a day (QID) | ORAL | 0 refills | 30 days | Status: CN | PRN
Start: 2022-07-18 — End: 2022-08-17

## 2022-07-18 NOTE — Unmapped (Addendum)
error 

## 2022-07-18 NOTE — Unmapped (Signed)
Addended by: Lance Coon on: 07/18/2022 10:23 AM     Modules accepted: Orders

## 2022-07-18 NOTE — Unmapped (Signed)
PDMP reviewed. Refill sent.

## 2022-07-18 NOTE — Unmapped (Signed)
Patient calls for refill on Percocet.     Requesting script [facility] [place] as cost is lower there.    Also requesting phone call to advise when script is sent and can be filled.

## 2022-07-18 NOTE — Unmapped (Signed)
Patient is requesting the following refill  Requested Prescriptions     Pending Prescriptions Disp Refills   ??? oxyCODONE-acetaminophen (PERCOCET) 5-325 mg per tablet 120 tablet 0     Sig: Take 1 tablet by mouth every six (6) hours as needed for pain.       Last refill given on:06/22/22 with 120 count and 0 refills.     Recent Visits  Date Type Provider Dept   06/17/22 Office Visit Jenell Milliner, MD [facility] [facility] [address] [facility]   01/15/22 Office Visit Jenell Milliner, MD [place] [facility]   12/28/21 Office Visit Deneise Lever, FNP [facility]   Showing recent visits within past 365 days with a meds authorizing provider and meeting all other requirements  Future Appointments  Date Type Provider Dept   10/17/22 Appointment Jenell Milliner, MD  [facility] [address] At [facility]   Showing future appointments within next 365 days with a meds authorizing provider and meeting all other requirements       Opioid Monitoring   Urine Tox Screen Last Drug Screen Date: 01/15/2022  Opiate Confirmation Test Last Drug Screen Date: 10/21/2019  Last Opioid Dispensed Provider: Jenell Milliner, MD  Prescribed MEDD : 30  Last PDMP Review: 05/23/2022  2:55 PM  Last Opioid Pain Agreement Signed Date: Not Found  Last Non Opioid Controlled Substance Pain Agreement Signed Date: Not Found    Naloxone Ordered: Not Found  Last OV: 06/17/2022

## 2022-07-22 MED ORDER — OXYCODONE-ACETAMINOPHEN 5 MG-325 MG TABLET
ORAL_TABLET | Freq: Four times a day (QID) | ORAL | 0 refills | 30 days | Status: CP | PRN
Start: 2022-07-22 — End: 2022-08-21

## 2022-07-23 NOTE — Unmapped (Signed)
Rheumatology return visit    REASON FOR VISIT: F/U GCA    HISTORY: Erik [name] is a 88 y.o. 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      Last seen 10/30/21 via telemedicine with myself.     He is taking Actemra 162 mg SQ q other week.     Interim history:  Pt presents for f/u today. He reports he is doing well overall. He reports vision as stable since last visit. He is unsure when last eye exam was. He is taking Actemra every other week without missed doses. He is still tolerating Actemra well. He denies recent eye pain, headache, jaw pain or temporal tenderness. No recent fever, illness or infections. No unexplained weight loss.     CURRENT MEDICATIONS:  Current Outpatient Medications   Medication Sig Dispense Refill    albuterol HFA 90 mcg/actuation inhaler Inhale 2 puffs every six (6) hours as needed for wheezing. 9 g 3    amLODIPine (NORVASC) 10 MG tablet Take 1 tablet (10 mg total) by mouth daily. 90 tablet 3    ascorbic acid (VITAMIN C) 500 MG tablet Take 1 tablet (500 mg total) by mouth daily.      aspirin (ECOTRIN) 81 MG tablet Take 1 tablet (81 mg total) by mouth daily.      atenoloL (TENORMIN) 50 MG tablet Take 1 tablet (50 mg total) by mouth Two (2) times a day. 180 tablet 3    calcium carbonate (OS-CAL) 1,500 mg (600 mg elem calcium) tablet Take 1 tablet (600 mg of elem calcium total) by mouth Two (2) times a day.      calcium-vitamin D 500 mg-5 mcg (200 unit) per tablet Take 1 tablet by mouth daily.      cyanocobalamin, vitamin B-12, 1000 MCG tablet Take 1 tablet (1,000 mcg total) by mouth daily.      furosemide (LASIX) 20 MG tablet Take 0.5-1 tablets (10-20 mg total) by mouth daily as needed. TAKE 1/2 TO 1 (ONE-HALF TO ONE) TABLET BY MOUTH ONCE DAILY AS NEEDED FOR EDEMA 90 tablet 0    hydrALAZINE (APRESOLINE) 25 MG tablet Take 1 tablet (25 mg total) by mouth daily. 90 tablet 3    losartan (COZAAR) 100 MG tablet Take 1 tablet (100 mg total) by mouth daily. (Patient taking differently: Take 1 tablet (100 mg total) by mouth daily. 1/2 tab daily) 90 tablet 3    multivitamin (TAB-A-VITE/THERAGRAN) per tablet Take 1 tablet by mouth daily.      mv-mins/folic/lycopene/ginkgo (ONE-A-DAY MEN'S 50+ ADVANTAGE ORAL) Take by mouth.      oxyCODONE-acetaminophen (PERCOCET) 5-325 mg per tablet Take 1 tablet by mouth every six (6) hours as needed for pain. 120 tablet 0    simvastatin (ZOCOR) 10 MG tablet Take 1 tablet (10 mg total) by mouth daily. 90 tablet 3    tamsulosin (FLOMAX) 0.4 mg capsule Take 1 capsule (0.4 mg total) by mouth daily. 90 capsule 3    tocilizumab (ACTEMRA) 162 mg/0.9 mL Syrg subcutaneous injection Inject the contents of one syringe (162 mg total) under the skin every fourteen (14) days. 6 mL 3    triamcinolone (KENALOG) 0.1 % cream Apply topically Two (2) times a day. 80 g 1     No current  facility-administered medications for this visit.       Past Medical History:   Diagnosis Date    Cataract 2005    cataract surgery    COPD (chronic obstructive pulmonary disease) (CMS-HCC) 10/07/2016    DJD (degenerative joint disease), lumbar     Hypertension     Mild anemia 01/15/2022    Tobacco abuse 02/15/2014        Record Review: Available records were reviewed, including pertinent office visits, labs, and imaging.      REVIEW OF SYSTEMS: Ten system were reviewed and negative except as noted above.    PHYSICAL EXAM:  VITAL SIGNS:   Vitals:    07/25/22 1221   BP: 144/50   BP Site: L Arm   BP Position: Sitting   BP Cuff Size: Medium   Pulse: 59   Temp: 36.5 ??C (97.7 ??F)   TempSrc: Temporal   Weight: 63.8 kg (140 lb 9.6 oz)     General:   Pleasant 88 y.o.male in no acute distress, WDWN   Eyes:   PERRL, conjunctiva and sclera not inflamed. Tears appear adequate.    ENT: No oropharyngeal lesions. Mucous membranes moist. No temporal tenderness.    Lymph:   No masses or cervical lymphadenopathy.    Cardiovascular:  Regular rate and rhythm. No murmur, rub, or gallop. No lower extremity edema.    Lungs:  Clear to auscultation.Normal respiratory effort.    Musculoskeletal:   General: Ambulates w/o assistance   Hands: No swelling or tenderness. Able to make a tight fist b/l   Wrists:FROM w/o swelling or tenderness   Elbows: FROM w/o swelling or tenderness   Shoulders: FROM w/o pain   Knees: FROM w/o effusions   Ankles: No swelling or tenderness   Feet: No pain with MTP squeeze    Neurological:  CN 2-12 grossly intact. 5/5 strength on extremities.   Psych:  Appropriate affect and mood   Skin:  No rashes.         ASSESSMENT/PLAN:  88 y.o. smoker with h/o COPD, CAD s/p CABG,  HTN, active alcohol use, GIB 2/2 duodenal ulcer (likely 2/2 concurrent ibuprofen, alcohol and prednisone use) presenting for follow-up of  biopsy proven giant cell arteritis  complicated by ischemic optic neuropathy/ vision loss in left eye (dx 11/2017)     He was last seen 10/30/21 as telemedicine visit with myself.     Currently taking tocilizumab 162 mg SQ q other week and tolerating without any side effects. Today he reports overall stable symptoms without vision changes. No s/s of active GCA or PMR.   - Continue tocilizumab 162 mg subcutaneous q2weeks.   - Recent CBC and CMP from 06/17/22 independently reviewed today and overall stable. Mild elevation in Crt but improved 07/01/22, PCP is following this.   -  Of note, patient with known h/o GIB/duodenal ulcer which does increase risk of GI perforation on tocilizumab - however, he is now >18 months out from this without any complications and wishes to continue treatment with tocilizumab  - Referred to [facility] to have eye exam. Stressed to pt the importance of regular eye exams with his h/o GCA. Pt verbalized understanding and agrees to schedule and attend visit with Ophthalmology.        F/U in 6 months with Dr. Ilsa Iha-- in person     I personally spent 32 minutes face-to-face and non-face-to-face in the care of this patient, which includes all pre, intra, and post visit time on  the date of service.  All documented time was specific to the E/M visit and does not include any procedures that may have been performed.

## 2022-07-25 ENCOUNTER — Ambulatory Visit: Admit: 2022-07-25 | Discharge: 2022-07-26 | Payer: MEDICARE | Attending: Family | Primary: Family

## 2022-07-25 DIAGNOSIS — M316 Other giant cell arteritis: Principal | ICD-10-CM

## 2022-07-25 NOTE — Unmapped (Signed)
Call [facility] at [phone] to schedule f/u.

## 2022-08-19 NOTE — Unmapped (Signed)
Patient is requesting the following medication(s) for refill:     oxyCODONE-acetaminophen (PERCOCET) 5-325 mg per tablet [[id number]]         [facility]    Please contact patient by [phone] and let him know

## 2022-08-19 NOTE — Unmapped (Signed)
Patient is requesting the following refill  Requested Prescriptions     Pending Prescriptions Disp Refills    oxyCODONE-acetaminophen (PERCOCET) 5-325 mg per tablet 120 tablet 0     Sig: Take 1 tablet by mouth every six (6) hours as needed for pain.       Recent Visits  Date Type Provider Dept   06/17/22 Office Visit Jenell Milliner, MD [place] [facility] [address] [facility]   01/15/22 Office Visit Jenell Milliner, MD [place] [facility]   12/28/21 Office Visit Deneise Lever, FNP [place] [facility]   Showing recent visits within past 365 days with a meds authorizing provider and meeting all other requirements  Future Appointments  Date Type Provider Dept   10/17/22 Appointment Jenell Milliner, MD [place] [facility] [address] [facility]   Showing future appointments within next 365 days with a meds authorizing provider and meeting all other requirements       Opioid Monitoring   Urine Tox Screen Last Drug Screen Date: 01/15/2022  Opiate Confirmation Test Last Drug Screen Date: 10/21/2019  Last Opioid Dispensed Provider: Loran Senters, FNP  Prescribed MEDD : 30  Last PDMP Review: 07/18/2022 12:00 PM  Last Opioid Pain Agreement Signed Date: Not Found  Last Non Opioid Controlled Substance Pain Agreement Signed Date: Not Found    Naloxone Ordered: Not Found  Last OV: 06/17/2022    07/22/2022 07/18/2022 2 Oxycodone-Acetaminophen 5-325 120.00 30 Ke Mac   06/22/2022 06/20/2022 2 Oxycodone-Acetaminophen 5-325 120.00 30 Yv Luy   05/23/2022 05/23/2022 2 Oxycodone-Acetaminophen 5-325 120.00 30 Eu Far   04/24/2022 04/21/2022 2 Oxycodone-Acetaminophen 5-325 120.00 30 Yv Luy   03/25/2022 03/25/2022 2 Oxycodone-Acetaminophen 5-325 120.00 30 Yv Luy

## 2022-08-20 NOTE — Unmapped (Signed)
Patient is calling in about his medication:    oxyCODONE-acetaminophen (PERCOCET) 5-325 mg per tablet [[id number]]     [facility]  [email or web address]  [address] [place], [place] [address] ?? ~23.2 [address]  [phone]    Patient states he has to arrange transportation for him to get his prescription.    Please see if possible to [facility] and confirm with patient so that he is able to have it picked up. He stated he will be out of it by tomorrow and wanting a call back as soon as possible.

## 2022-08-20 NOTE — Unmapped (Signed)
Prescription sent 08/19/22. Pt called and made aware

## 2022-08-22 MED ORDER — OXYCODONE-ACETAMINOPHEN 5 MG-325 MG TABLET
ORAL_TABLET | Freq: Four times a day (QID) | ORAL | 0 refills | 30.00000 days | Status: CP | PRN
Start: 2022-08-22 — End: 2022-09-21

## 2022-08-27 MED ORDER — FUROSEMIDE 20 MG TABLET
ORAL_TABLET | Freq: Every day | ORAL | 1 refills | 90 days | Status: CP | PRN
Start: 2022-08-27 — End: 2023-08-27

## 2022-09-18 NOTE — Unmapped (Signed)
Patient is requesting the following refill  Requested Prescriptions     Pending Prescriptions Disp Refills    oxyCODONE-acetaminophen (PERCOCET) 5-325 mg per tablet 120 tablet 0     Sig: Take 1 tablet by mouth every six (6) hours as needed for pain.       Last refill given on: 08/19/22 with 120 count and 0 refills.     Recent Visits  Date Type Provider Dept   06/17/22 Office Visit Jenell Milliner, MD [place] [facility] [address] [facility]   01/15/22 Office Visit Jenell Milliner, MD [place] [facility]   12/28/21 Office Visit Johnn Hai, Loleta Rose, FNP [place] [facility]   Showing recent visits within past 365 days with a meds authorizing provider and meeting all other requirements  Future Appointments  Date Type Provider Dept   10/17/22 Appointment Jenell Milliner, MD [place] [facility] [address] [facility]   Showing future appointments within next 365 days with a meds authorizing provider and meeting all other requirements       Opioid Monitoring   Urine Tox Screen Last Drug Screen Date: 01/15/2022  Opiate Confirmation Test Last Drug Screen Date: 10/21/2019  Last Opioid Dispensed Provider: Jenell Milliner, MD  Prescribed MEDD : 30  Last PDMP Review: 09/18/2022 12:57 PM  Last Opioid Pain Agreement Signed Date: Not Found  Last Non Opioid Controlled Substance Pain Agreement Signed Date: Not Found    Naloxone Ordered: Not Found  Last OV: 01/15/2022

## 2022-09-18 NOTE — Unmapped (Deleted)
Patient reports that they have previously requested refill from pharmacy, but have not received authorization yet.   Patient is requesting the following medication(s) for refill: oxyCODONE-acetaminophen (PERCOCET) 5-325 mg per tablet  and a quantity for 1 month supply    Pharmacy name and address: [facility] [address], [place] - [address]  [address] [place] [address]  Phone: [phone]  Fax: [phone]     Please contact patient by Cell Phone    Last office visit: Visit date not found

## 2022-09-18 NOTE — Unmapped (Signed)
Addended by: Barbaraann Boys on: 09/18/2022 12:58 PM     Modules accepted: Orders

## 2022-09-18 NOTE — Unmapped (Signed)
Patient reports that they have previously requested refill from pharmacy, but have not received authorization yet.   Patient is requesting the following medication(s) for refill: oxyCODONE-acetaminophen (PERCOCET) 5-325 mg per tablet  and a quantity for 1 month supply    [facility] [address]   Phone: [phone]   Fax: [phone]     Previously has been [facility] the [facility]     Please contact patient by Cell Phone  Last office visit: 01/15/2022      *Patient would like a phone call when this has been completed and confirm which pharmacy it has been sent to.   *Patient states he was prescribed 30 days last month, but the month had 31 days and he is running out.

## 2022-09-19 NOTE — Unmapped (Signed)
Patient is needing an update on his medication refill for:    oxyCODONE-acetaminophen (PERCOCET)     In the refill medication, he has [facility] It is [facility] for [facility] (around [facility] (around $40) per patient.     Please see if possible to contact patient to assist he has not received a call in regards to this refill information and confirm which location he is able to pick up the medication.

## 2022-09-20 NOTE — Unmapped (Signed)
Was sent to wrong pharmacy, could you resend, please?

## 2022-09-21 MED ORDER — OXYCODONE-ACETAMINOPHEN 5 MG-325 MG TABLET
ORAL_TABLET | Freq: Four times a day (QID) | ORAL | 0 refills | 30.00000 days | Status: CP | PRN
Start: 2022-09-21 — End: 2022-10-21

## 2022-09-25 NOTE — Unmapped (Signed)
Eye exam from 09/23/22 received and reports hx of GCA, stable. See under media 09/23/22.    Brennan Bailey, FNP

## 2022-10-09 NOTE — Unmapped (Signed)
Assessment/Plan:    Rodner was seen today for follow-up.    Diagnoses and all orders for this visit:    Exudative age-related macular degeneration of right eye, unspecified stage (CMS-HCC)    Essential hypertension, benign  -     Creatinine, Serum; Future  -     Creatinine, Serum    Coronary artery disease involving autologous vein coronary bypass graft without angina pectoris    Chronic obstructive pulmonary disease, unspecified COPD type (CMS-HCC)    Giant cell arteritis (CMS-HCC)    DDD (degenerative disc disease), lumbar  -     oxyCODONE-acetaminophen (PERCOCET) 5-325 mg per tablet; Take 1 tablet by mouth every six (6) hours as needed for pain.    Benign prostatic hyperplasia, unspecified whether lower urinary tract symptoms present    Chronic pain syndrome  -     oxyCODONE-acetaminophen (PERCOCET) 5-325 mg per tablet; Take 1 tablet by mouth every six (6) hours as needed for pain.    Dyslipidemia    Mild anemia    Elevated serum creatinine    Other orders  -     INFLUENZA VACCINE (QUAD) IM - 6 MO-ADULT - PF      -BPH:  History of slow urinary flow- pt started on Flomax 0.4 mg at bedtime.  His symptoms remain stable.  His urinary stream is slow and he  denies retention symptoms.     - chronic low back pain in the setting of lumbar DDD:  the pt continues on percocet Rx at 120 tabs a month. Review of PDMP reflects med compliance.  Annual UDS done in 12/2021, confirmed presence of opioid and I  renewed his annual controlled substance contract at that time.       - COPD history: pt continue to smoke occasionally.  He has had no chronic SOB symptoms but he was admitted to [facility] hospital in 10/2021 for COPD exacerbation in the setting of Rhinovirus infection and CAP.  He has had no formal PFT's.   Most inhalers prescribed in the past (namely Spiriva) have been to costly for the pt. He is currently not on duoneb (declined by pt) and he has the albuterol inhaler.  He uses albuterol inhaler twice a day.     - history of Giant Cell arteritis/ischemic optic neuropathy and left eye vision loss: he will continue follow up with his rheumatologist . His last visit there was in 07/2022 and encounter details reviewed with the pt today.  He is taking Actemra 162 mg SQ q other week . He has been doing well and denied eye pain, jaw or temporal pain. He had recent eye exam at [facility] and he had an abnormal exam and he was referred to a [facility] for age related MD of the right eye.  He underwent intravitreal avastin regimen as scheduled.        - HTN/CAD/Dyslipidemia history: pt remains normotensive and chest pain free on current medication regimen. His home BP readings are normal. He reports no dizziness or lightheadedness.  Creatinine level was 1.15 in January/2023. Repeat CMP done in 05/2022  revealed elevated kidney function/creatinine of 1.45.  Other CMP results are normal.  I recommended he reduce losartan dose to half of a 100 mg tablet daily. A follow up serum creatinine level, 2 weeks later remained elevated but improved at 1.35. he is due for repeat today /consider renal imaging if level remains elevated.  He has had normal home blood pressure readings on BP  log review today.   He is currently not followed by cardiology. Lipid panel done 12/2021 revealed HDL/LDL of 55/66 and LFT's were normal in 05/2022..    -Macrocytic anemia: Noted on serial CBCs in recent past pt states he was a heavy drinker in the past but more recently reduced his daily alcohol consumption to 3 beers a day.  Repeat CBC done in 05/2022 revealed stable anemia with H/H of 11/32, MCV of 96 with normal WBC/plt counts, normal iron/ferritin, TSH/Free T4, folate and Vit B12 levels.  He has no fatigue symptoms.          I personally spent 40 minutes, face to face, and non face to face, in the care of this patient, which includes pre , intra and post visit time on date of service.  All documented time is specific to EM and dose not include any procedures performed today.    Return in about 4 months (around 02/17/2023) for Recheck.    Subjective:     HPI  The pt is seen today for routine 4 month follow up visit for the following medical conditions:      -BPH:  History of slow urinary flow- pt started on Flomax 0.4 mg at bedtime.  His symptoms remain stable.  His urinary stream is slow and he  denies retention symptoms.     - chronic low back pain in the setting of lumbar DDD:  the pt continues on percocet Rx at 120 tabs a month. Review of PDMP reflects med compliance.  Annual UDS done in 12/2021, confirmed presence of opioid and I  renewed his annual controlled substance contract at that time.       - COPD history: pt continue to smoke occasionally.  He has had no chronic SOB symptoms but he was admitted to [facility] hospital in 10/2021 for COPD exacerbation in the setting of Rhinovirus infection and CAP.  He has had no formal PFT's.   Most inhalers prescribed in the past (namely Spiriva) have been to costly for the pt. He is currently not on duoneb (declined by pt) and he has the albuterol inhaler.  He uses albuterol inhaler twice a day.     - history of Giant Cell arteritis/ischemic optic neuropathy and left eye vision loss: he will continue follow up with his rheumatologist . His last visit there was in 07/2022 and encounter details reviewed with the pt today.  He is taking Actemra 162 mg SQ q other week . He has been doing well and denied eye pain, jaw or temporal pain. He had recent eye exam at [facility] and he had an abnormal exam and he was referred to a [facility] [facility] for age related MD of the right eye.  He underwent intravitreal avastin regimen as scheduled.        - HTN/CAD/Dyslipidemia history: pt remains normotensive and chest pain free on current medication regimen. His home BP readings are normal. He reports no dizziness or lightheadedness.  Creatinine level was 1.15 in January/2023. Repeat CMP done in 05/2022  revealed elevated kidney function/creatinine of 1.45.  Other CMP results are normal.  I recommended he reduce losartan dose to half of a 100 mg tablet daily. A follow up serum creatinine level, 2 weeks later remained elevated but improved at 1.35. he is due for repeat today /consider renal imaging if level remains elevated.  He has had normal home blood pressure readings on BP log review today.   He is currently not  followed by cardiology. Lipid panel done 12/2021 revealed HDL/LDL of 55/66 and LFT's were normal in 05/2022..    -Macrocytic anemia: Noted on serial CBCs in recent past pt states he was a heavy drinker in the past but more recently reduced his daily alcohol consumption to 3 beers a day.  Repeat CBC done in 05/2022 revealed stable anemia with H/H of 11/32, MCV of 96 with normal WBC/plt counts, normal iron/ferritin, TSH/Free T4, folate and Vit B12 levels.  He has no fatigue symptoms.                  ROS  Constitutional:  Denies  unexpected weight loss or gain, or weakness   Eyes:  Denies visual changes  Respiratory:  Denies cough or shortness of breath. No change in exercise  tolerance  Cardiovascular:  Denies chest pain, palpitations or lower extremity swelling   GI:  Denies abdominal pain, diarrhea, constipation   Musculoskeletal:  Denies myalgias  Skin:  Denies nonhealing lesions  Neurologic:  Denies headache, focal weakness or numbness, tingling  Endocrine:  Denies polyuria or polydypsia   Psychiatric:  Denies depression, anxiety      Outpatient Medications Prior to Visit   Medication Sig Dispense Refill    albuterol HFA 90 mcg/actuation inhaler Inhale 2 puffs every six (6) hours as needed for wheezing. 9 g 3    amLODIPine (NORVASC) 10 MG tablet Take 1 tablet (10 mg total) by mouth daily. 90 tablet 3    ascorbic acid (VITAMIN C) 500 MG tablet Take 1 tablet (500 mg total) by mouth daily.      aspirin (ECOTRIN) 81 MG tablet Take 1 tablet (81 mg total) by mouth daily.      atenoloL (TENORMIN) 50 MG tablet Take 1 tablet (50 mg total) by mouth Two (2) times a day. 180 tablet 3    calcium carbonate (OS-CAL) 1,500 mg (600 mg elem calcium) tablet Take 1 tablet (600 mg of elem calcium total) by mouth two (2) times a day.      calcium-vitamin D 500 mg-5 mcg (200 unit) per tablet Take 1 tablet by mouth daily.      cyanocobalamin, vitamin B-12, 1000 MCG tablet Take 1 tablet (1,000 mcg total) by mouth daily.      furosemide (LASIX) 20 MG tablet Take 0.5-1 tablets (10-20 mg total) by mouth daily as needed for swelling. 90 tablet 1    hydrALAZINE (APRESOLINE) 25 MG tablet Take 1 tablet (25 mg total) by mouth daily. 90 tablet 3    losartan (COZAAR) 100 MG tablet Take 1 tablet (100 mg total) by mouth daily. (Patient taking differently: Take 1 tablet (100 mg total) by mouth daily. 1/2 tab daily) 90 tablet 3    multivitamin (TAB-A-VITE/THERAGRAN) per tablet Take 1 tablet by mouth daily.      simvastatin (ZOCOR) 10 MG tablet Take 1 tablet (10 mg total) by mouth daily. 90 tablet 3    tamsulosin (FLOMAX) 0.4 mg capsule Take 1 capsule (0.4 mg total) by mouth daily. 90 capsule 3    tocilizumab (ACTEMRA) 162 mg/0.9 mL Syrg subcutaneous injection Inject the contents of one syringe (162 mg total) under the skin every fourteen (14) days. 6 mL 3    triamcinolone (KENALOG) 0.1 % cream Apply topically Two (2) times a day. 80 g 1    oxyCODONE-acetaminophen (PERCOCET) 5-325 mg per tablet Take 1 tablet by mouth every six (6) hours as needed for pain. 120 tablet 0    mv-mins/folic/lycopene/ginkgo (ONE-A-DAY  MEN'S 50+ ADVANTAGE ORAL) Take by mouth.       No facility-administered medications prior to visit.         Objective:       Vital Signs  BP 132/58  - Pulse 60  - Temp 36 ??C (96.8 ??F) (Temporal)  - Ht 157.5 cm (5' 2)  - Wt 63.2 kg (139 lb 6.4 oz)  - SpO2 98%  - BMI 25.50 kg/m??      Exam  General: normal appearance  EYES: Anicteric sclerae.  ENT: Oropharynx moist.  RESP: Relaxed respiratory effort. Clear to auscultation without wheezes or crackles.   CV: Regular rate and rhythm. Normal S1 and S2. No murmurs or gallops.  No lower extremity edema. Posterior tibial pulses are 2+ and symmetric.  abd exam: non tender, no masses, no HSM   MSK: No focal muscle tenderness.  SKIN: Appropriately warm and moist.  NEURO: Stable gait and coordination.    Allergies:     Patient has no known allergies.    Current Medications:     Current Outpatient Medications   Medication Sig Dispense Refill    albuterol HFA 90 mcg/actuation inhaler Inhale 2 puffs every six (6) hours as needed for wheezing. 9 g 3    amLODIPine (NORVASC) 10 MG tablet Take 1 tablet (10 mg total) by mouth daily. 90 tablet 3    ascorbic acid (VITAMIN C) 500 MG tablet Take 1 tablet (500 mg total) by mouth daily.      aspirin (ECOTRIN) 81 MG tablet Take 1 tablet (81 mg total) by mouth daily.      atenoloL (TENORMIN) 50 MG tablet Take 1 tablet (50 mg total) by mouth Two (2) times a day. 180 tablet 3    calcium carbonate (OS-CAL) 1,500 mg (600 mg elem calcium) tablet Take 1 tablet (600 mg of elem calcium total) by mouth two (2) times a day.      calcium-vitamin D 500 mg-5 mcg (200 unit) per tablet Take 1 tablet by mouth daily.      cyanocobalamin, vitamin B-12, 1000 MCG tablet Take 1 tablet (1,000 mcg total) by mouth daily.      furosemide (LASIX) 20 MG tablet Take 0.5-1 tablets (10-20 mg total) by mouth daily as needed for swelling. 90 tablet 1    hydrALAZINE (APRESOLINE) 25 MG tablet Take 1 tablet (25 mg total) by mouth daily. 90 tablet 3    losartan (COZAAR) 100 MG tablet Take 1 tablet (100 mg total) by mouth daily. (Patient taking differently: Take 1 tablet (100 mg total) by mouth daily. 1/2 tab daily) 90 tablet 3    multivitamin (TAB-A-VITE/THERAGRAN) per tablet Take 1 tablet by mouth daily.      simvastatin (ZOCOR) 10 MG tablet Take 1 tablet (10 mg total) by mouth daily. 90 tablet 3    tamsulosin (FLOMAX) 0.4 mg capsule Take 1 capsule (0.4 mg total) by mouth daily. 90 capsule 3    tocilizumab (ACTEMRA) 162 mg/0.9 mL Syrg subcutaneous injection Inject the contents of one syringe (162 mg total) under the skin every fourteen (14) days. 6 mL 3    triamcinolone (KENALOG) 0.1 % cream Apply topically Two (2) times a day. 80 g 1    mv-mins/folic/lycopene/ginkgo (ONE-A-DAY MEN'S 50+ ADVANTAGE ORAL) Take by mouth.      [START ON 10/20/2022] oxyCODONE-acetaminophen (PERCOCET) 5-325 mg per tablet Take 1 tablet by mouth every six (6) hours as needed for pain. 120 tablet 0     No current facility-administered  medications for this visit.           Note - This record has been created using [employer]. Chart creation errors have been sought, but may not always have been located. Such creation errors do not reflect on the standard of medical care.    Jenell Milliner, MD

## 2022-10-15 NOTE — Unmapped (Signed)
Eye exam from 10/07/22 received via fax and stable per report. See under media dated 10/08/22.    Brennan Bailey, FNP

## 2022-10-17 ENCOUNTER — Ambulatory Visit: Admit: 2022-10-17 | Discharge: 2022-10-18 | Payer: MEDICARE

## 2022-10-17 DIAGNOSIS — J449 Chronic obstructive pulmonary disease, unspecified: Principal | ICD-10-CM

## 2022-10-17 DIAGNOSIS — I25810 Atherosclerosis of coronary artery bypass graft(s) without angina pectoris: Principal | ICD-10-CM

## 2022-10-17 DIAGNOSIS — M5136 Other intervertebral disc degeneration, lumbar region: Principal | ICD-10-CM

## 2022-10-17 DIAGNOSIS — M316 Other giant cell arteritis: Principal | ICD-10-CM

## 2022-10-17 DIAGNOSIS — N400 Enlarged prostate without lower urinary tract symptoms: Principal | ICD-10-CM

## 2022-10-17 DIAGNOSIS — I10 Essential (primary) hypertension: Principal | ICD-10-CM

## 2022-10-17 DIAGNOSIS — H353210 Exudative age-related macular degeneration, right eye, stage unspecified: Principal | ICD-10-CM

## 2022-10-17 DIAGNOSIS — G894 Chronic pain syndrome: Principal | ICD-10-CM

## 2022-10-17 DIAGNOSIS — R7989 Other specified abnormal findings of blood chemistry: Principal | ICD-10-CM

## 2022-10-17 DIAGNOSIS — E785 Hyperlipidemia, unspecified: Principal | ICD-10-CM

## 2022-10-17 DIAGNOSIS — D649 Anemia, unspecified: Principal | ICD-10-CM

## 2022-10-17 LAB — CREATININE
CREATININE: 1.39 mg/dL — ABNORMAL HIGH
EGFR CKD-EPI (2021) MALE: 49 mL/min/{1.73_m2} — ABNORMAL LOW (ref >=60)

## 2022-10-17 NOTE — Unmapped (Signed)
For the patient that his kidney function blood work, serum creatinine level remains elevated at 1.39.  Recommend ultrasound of his kidneys.  Referral submitted.

## 2022-10-17 NOTE — Unmapped (Signed)
Addended by: Jenell Milliner on: 10/17/2022 04:39 PM     Modules accepted: Orders

## 2022-10-18 NOTE — Unmapped (Deleted)
Opioid Monitoring   Urine Tox ScreenLast Drug Screen Date: 01/15/2022  Opiate Confirmation Test Last Drug Screen Date: 10/21/2019  Last Opioid Dispensed Provider: Jenell Milliner, MD  Prescribed MEDD : 30  Last PDMP Review: 09/20/2022  9:09 AM  Last contract signed 01/15/22

## 2022-10-20 MED ORDER — OXYCODONE-ACETAMINOPHEN 5 MG-325 MG TABLET
ORAL_TABLET | Freq: Four times a day (QID) | ORAL | 0 refills | 30 days | Status: CP | PRN
Start: 2022-10-20 — End: 2022-11-19

## 2022-10-21 NOTE — Unmapped (Signed)
Patient has been notified of results.  

## 2022-11-18 DIAGNOSIS — M5136 Other intervertebral disc degeneration, lumbar region: Principal | ICD-10-CM

## 2022-11-18 DIAGNOSIS — G894 Chronic pain syndrome: Principal | ICD-10-CM

## 2022-11-18 NOTE — Unmapped (Signed)
Patient was notified and he will call pharmacy tomorrow to determine if order has been filled.

## 2022-11-18 NOTE — Unmapped (Signed)
Pt called requesting refills of rx percocet and oxycodone. States he needs these by tomorrow 11/19/22.  Pt would like call back from pcp cma when this is called in so he can arrange transportation.       [id number] [place], [place] - [address] Phone: [phone]   Fax: [phone]

## 2022-11-19 MED ORDER — OXYCODONE-ACETAMINOPHEN 5 MG-325 MG TABLET
ORAL_TABLET | Freq: Four times a day (QID) | ORAL | 0 refills | 30 days | Status: CP | PRN
Start: 2022-11-19 — End: 2022-12-19

## 2022-12-05 NOTE — Unmapped (Signed)
[place] [facility]  SUCCESSFUL ENGAGEMENT      Erik [name] is a 88 y.o. male identified as possibly having a proportion of days covered of less than 80% for a hypertension medication, based on payer reports and chart review. Contacted the Patient to review medication use history for the medication(s) described below.    Clinical Medication Adherence Assessment      Medication Assessed #1: LOSARTAN 100MG    Chronic Disease State: Hypertension   Source of Data/Request for Medication Adherence Assessment: Payer Report   Does patient indicate they are taking their medication as prescribed?: Yes   Are prescription instructions in line with the way patient is currently taking this medication?: Yes   Medication Refills Remaining?: Yes   Date of Last Refill: 06/17/22   Do you currently receive a 90-day supply of this medication?: Yes   In general do you have any trouble obtaining or affording your medications?: No   Does the patient have any perceived side effects from this medication?: No         What other reasons can you think of that may have caused missed doses or gaps in medication fill history?: None   Reasons for Non-Adherence: Other   Patient states that he takes Losartan 100 mg half daily . Some days he takes a full tablet and some day cuts it in half depending on his blood pressure readings . He states he will request refill when necessary    Actions taken for this medication: None      General Medication Adherence: :   Population: Advertising copywriter [facility])   Contact Source: Patient   Who helps you with your medications?: Manage Myself   What reminder methods or adherence aids do you currently use to take your medications?: Keeping medication in visible location   Thinking about all of your medications, do you have any issues or concerns that we can further assist you with?: No      Would you be interested in discussing your medication concerns further through a scheduled virtual visit with a pharmacist?: No   Interventions Taken: None              [name] , CPhT  Certified Pharmacy Technician  [place] [facility] ([facility])

## 2022-12-17 DIAGNOSIS — M5136 Other intervertebral disc degeneration, lumbar region: Principal | ICD-10-CM

## 2022-12-17 DIAGNOSIS — G894 Chronic pain syndrome: Principal | ICD-10-CM

## 2022-12-17 NOTE — Unmapped (Signed)
Patient reports that they have previously requested refill from pharmacy, but have not received authorization yet.   Patient is requesting the following medication(s) for refill: oxyCODONE-acetaminophen (PERCOCET) 5-325 mg per tablet [[id number]]  and a quantity for 1 month supply    [facility] address[id number], [place] - [address]  [address] [place], [place] [address]  Phone: [phone]  Fax: [phone]   Call back when called in.  Please contact patient by Cell Phone    Last office visit: 10/17/2022

## 2022-12-17 NOTE — Unmapped (Signed)
Patient is requesting the following refill  Requested Prescriptions     Pending Prescriptions Disp Refills    oxyCODONE-acetaminophen (PERCOCET) 5-325 mg per tablet 120 tablet 0     Sig: Take 1 tablet by mouth every six (6) hours as needed for pain.       Last refill given on: 11/18/22 with 120 count and 0 refills.     Recent Visits  Date Type Provider Dept   10/17/22 Office Visit Jenell Milliner, MD [facility] [address] [facility]   06/17/22 Office Visit Jenell Milliner, MD [facility] [address] [facility]   01/15/22 Office Visit Jenell Milliner, MD [facility]   12/28/21 Office Visit Deneise Lever, FNP [facility]   Showing recent visits within past 365 days with a meds authorizing provider and meeting all other requirements  Future Appointments  Date Type Provider Dept   02/17/23 Appointment Jenell Milliner, MD [facility] [address] [facility]   Showing future appointments within next 365 days with a meds authorizing provider and meeting all other requirements       Opioid Monitoring   Urine Tox Screen Last Drug Screen Date: 01/15/2022  Opiate Confirmation Test Last Drug Screen Date: Not Found  Last Opioid Dispensed Provider: Jenell Milliner, MD  Prescribed MEDD : 30  Last PDMP Review: 12/17/2022 12:28 PM  Last Opioid Pain Agreement Signed Date: 01/16/2022  Last Non Opioid Controlled Substance Pain Agreement Signed Date: Not Found    Naloxone Ordered: Not Found  Last OV: 10/17/2022

## 2022-12-18 MED ORDER — OXYCODONE-ACETAMINOPHEN 5 MG-325 MG TABLET
ORAL_TABLET | Freq: Four times a day (QID) | ORAL | 0 refills | 30 days | Status: CP | PRN
Start: 2022-12-18 — End: 2023-01-17

## 2022-12-25 DIAGNOSIS — M316 Other giant cell arteritis: Principal | ICD-10-CM

## 2022-12-25 MED ORDER — ACTEMRA 162 MG/0.9 ML SUBCUTANEOUS SYRINGE
SUBCUTANEOUS | 3 refills | 84 days | Status: CP
Start: 2022-12-25 — End: ?

## 2022-12-25 NOTE — Unmapped (Signed)
Addended by: Arnette Felts on: 12/25/2022 03:57 PM     Modules accepted: Orders

## 2022-12-25 NOTE — Unmapped (Signed)
Reason for call:   Pt need a new prescription for Actemra - please call pt when this is done, he will take his last one tomorrow. And he said no one ever gives him a f/u call.Thanks     Last ov: 07/25/2022  Next ov: 03/24/2023

## 2022-12-25 NOTE — Unmapped (Signed)
Actemra refill  Last Visit Date: 07/25/2022  Next Visit Date: 03/24/2023    Lab Results   Component Value Date    ALT 16 06/17/2022    AST 22 06/17/2022    ALBUMIN 4.3 06/17/2022    CREATININE 1.39 (H) 10/17/2022     Lab Results   Component Value Date    WBC 8.3 06/17/2022    HGB 11.3 (L) 06/17/2022    HCT 32.9 (L) 06/17/2022    PLT 183 06/17/2022     Lab Results   Component Value Date    NEUTROPCT 52.0 12/28/2021    LYMPHOPCT 27.2 12/28/2021    MONOPCT 11.5 12/28/2021    EOSPCT 8.5 12/28/2021    BASOPCT 0.8 12/28/2021

## 2022-12-25 NOTE — Unmapped (Signed)
Called pt at request of Brennan Bailey, NP.  Informed pt that he needs labs drawn before Actemra can be refilled.  He said that he had labs drawn recently, no full CBC or CMP since June. Called PCP [facility] provider) to double check, PCP verified no labs drawn more recently.  Called pt back to see if he had labs drawn somewhere else, he said no only PCP.  I informed him that labs need to be monitored for toxicity, pt became angry G*D* I'm 88 and I don't drive. I'll just stop taking it and I'll go blind and I'll sue your a$*  I informed pt that it is always his decision whether or not to take the medication, but our protocol is to drawn drug monitoring labs every 3-4 months and labs have been ordered, he can go to his PCP anytime to have these drawn. I told pt I would let provider know that he is upset and may not get the labs drawn  Pt was silent, I said hello, Mr Twilley he said yes  and I repeated myself, he was silent again.  Call ended.    Provider notified

## 2022-12-26 ENCOUNTER — Other Ambulatory Visit: Admit: 2022-12-26 | Discharge: 2022-12-27 | Payer: MEDICARE

## 2022-12-26 DIAGNOSIS — M316 Other giant cell arteritis: Principal | ICD-10-CM

## 2022-12-26 LAB — COMPREHENSIVE METABOLIC PANEL
ALBUMIN: 4.3 g/dL (ref 3.4–5.0)
ALKALINE PHOSPHATASE: 56 U/L (ref 46–116)
ALT (SGPT): 20 U/L (ref 10–49)
ANION GAP: 10 mmol/L (ref 5–14)
AST (SGOT): 24 U/L (ref <=34)
BILIRUBIN TOTAL: 0.5 mg/dL (ref 0.3–1.2)
BLOOD UREA NITROGEN: 15 mg/dL (ref 9–23)
BUN / CREAT RATIO: 13
CALCIUM: 8.7 mg/dL (ref 8.7–10.4)
CHLORIDE: 104 mmol/L (ref 98–107)
CO2: 24 mmol/L (ref 20.0–31.0)
CREATININE: 1.19 mg/dL — ABNORMAL HIGH
EGFR CKD-EPI (2021) MALE: 59 mL/min/{1.73_m2} — ABNORMAL LOW (ref >=60)
GLUCOSE RANDOM: 98 mg/dL (ref 70–179)
POTASSIUM: 4.3 mmol/L (ref 3.4–4.8)
PROTEIN TOTAL: 6.6 g/dL (ref 5.7–8.2)
SODIUM: 138 mmol/L (ref 135–145)

## 2022-12-26 LAB — CBC W/ AUTO DIFF
BASOPHILS ABSOLUTE COUNT: 0.1 10*9/L (ref 0.0–0.1)
BASOPHILS RELATIVE PERCENT: 0.9 %
EOSINOPHILS ABSOLUTE COUNT: 0.5 10*9/L (ref 0.0–0.5)
EOSINOPHILS RELATIVE PERCENT: 7 %
HEMATOCRIT: 31.5 % — ABNORMAL LOW (ref 39.0–48.0)
HEMOGLOBIN: 10.7 g/dL — ABNORMAL LOW (ref 12.9–16.5)
LYMPHOCYTES ABSOLUTE COUNT: 2.1 10*9/L (ref 1.1–3.6)
LYMPHOCYTES RELATIVE PERCENT: 28.4 %
MEAN CORPUSCULAR HEMOGLOBIN CONC: 34.1 g/dL (ref 32.0–36.0)
MEAN CORPUSCULAR HEMOGLOBIN: 33.7 pg — ABNORMAL HIGH (ref 25.9–32.4)
MEAN CORPUSCULAR VOLUME: 98.8 fL — ABNORMAL HIGH (ref 77.6–95.7)
MEAN PLATELET VOLUME: 8.6 fL (ref 6.8–10.7)
MONOCYTES ABSOLUTE COUNT: 0.9 10*9/L — ABNORMAL HIGH (ref 0.3–0.8)
MONOCYTES RELATIVE PERCENT: 12.2 %
NEUTROPHILS ABSOLUTE COUNT: 3.9 10*9/L (ref 1.8–7.8)
NEUTROPHILS RELATIVE PERCENT: 51.5 %
PLATELET COUNT: 169 10*9/L (ref 150–450)
RED BLOOD CELL COUNT: 3.19 10*12/L — ABNORMAL LOW (ref 4.26–5.60)
RED CELL DISTRIBUTION WIDTH: 13.1 % (ref 12.2–15.2)
WBC ADJUSTED: 7.5 10*9/L (ref 3.6–11.2)

## 2022-12-30 DIAGNOSIS — J449 Chronic obstructive pulmonary disease, unspecified: Principal | ICD-10-CM

## 2022-12-30 MED ORDER — ALBUTEROL SULFATE HFA 90 MCG/ACTUATION AEROSOL INHALER
Freq: Four times a day (QID) | RESPIRATORY_TRACT | 5 refills | 0 days | Status: CP | PRN
Start: 2022-12-30 — End: 2023-12-30

## 2022-12-30 NOTE — Unmapped (Signed)
Reason for call: Pt is requesting for a call back regarding his Actemra and whether or not it has been approved.     Thanks       Last ov: 07/25/2022  Next ov: 04/14/2023

## 2022-12-31 DIAGNOSIS — M316 Other giant cell arteritis: Principal | ICD-10-CM

## 2022-12-31 MED ORDER — ACTEMRA 162 MG/0.9 ML SUBCUTANEOUS SYRINGE
SUBCUTANEOUS | 3 refills | 84 days | Status: CP
Start: 2022-12-31 — End: ?

## 2022-12-31 NOTE — Unmapped (Signed)
Actemra refill  Last Visit Date: 07/25/2022  Next Visit Date: 04/14/2023    Lab Results   Component Value Date    ALT 20 12/26/2022    AST 24 12/26/2022    ALBUMIN 4.3 12/26/2022    CREATININE 1.19 (H) 12/26/2022     Lab Results   Component Value Date    WBC 7.5 12/26/2022    HGB 10.7 (L) 12/26/2022    HCT 31.5 (L) 12/26/2022    PLT 169 12/26/2022     Lab Results   Component Value Date    NEUTROPCT 51.5 12/26/2022    LYMPHOPCT 28.4 12/26/2022    MONOPCT 12.2 12/26/2022    EOSPCT 7.0 12/26/2022    BASOPCT 0.9 12/26/2022

## 2023-01-14 DIAGNOSIS — M5136 Other intervertebral disc degeneration, lumbar region: Principal | ICD-10-CM

## 2023-01-14 DIAGNOSIS — G894 Chronic pain syndrome: Principal | ICD-10-CM

## 2023-01-14 NOTE — Unmapped (Signed)
Patient is requesting the following refill  Requested Prescriptions     Pending Prescriptions Disp Refills    oxyCODONE-acetaminophen (PERCOCET) 5-325 mg per tablet 120 tablet 0     Sig: Take 1 tablet by mouth every six (6) hours as needed for pain.       Last refill given on: 12/17/22 with 120 count and 0 refills.     Recent Visits  Date Type Provider Dept   10/17/22 Office Visit Jenell Milliner, MD [facility] [address] [facility]   06/17/22 Office Visit Jenell Milliner, MD [facility] [address] [facility]   01/15/22 Office Visit Jenell Milliner, MD [place] [facility]   Showing recent visits within past 365 days with a meds authorizing provider and meeting all other requirements  Future Appointments  Date Type Provider Dept   02/17/23 Appointment Jenell Milliner, MD [facility] [address] [facility]   Showing future appointments within next 365 days with a meds authorizing provider and meeting all other requirements       Opioid Monitoring   Urine Tox Screen Last Drug Screen Date: 01/15/2022  Opiate Confirmation Test Last Drug Screen Date: Not Found  Last Opioid Dispensed Provider: Jenell Milliner, MD  Prescribed MEDD : 30  Last PDMP Review: 01/14/2023  1:00 PM  Last Opioid Pain Agreement Signed Date: 01/16/2022  Last Non Opioid Controlled Substance Pain Agreement Signed Date: Not Found    Naloxone Ordered: Not Found  Last OV: 01/15/2022

## 2023-01-14 NOTE — Unmapped (Signed)
Patient is requesting medication refill for oxycodone-acetaminophen (PERCOCET) 5-325 mg per tablet [[id number]] patient states he is not out of medication and does not know how much he has left.     [facility] to [facility] at [id number] - [place], [place] - [address] [[id number]]       Please call patient for further assistance. TY

## 2023-01-14 NOTE — Unmapped (Signed)
Addended by: Barbaraann Boys on: 01/14/2023 01:01 PM     Modules accepted: Orders

## 2023-01-17 MED ORDER — OXYCODONE-ACETAMINOPHEN 5 MG-325 MG TABLET
ORAL_TABLET | Freq: Four times a day (QID) | ORAL | 0 refills | 30 days | Status: CP | PRN
Start: 2023-01-17 — End: 2023-02-16

## 2023-02-11 NOTE — Unmapped (Signed)
Assessment/Plan:    Erik [name] was seen today for follow-up.    Diagnoses and all orders for this visit:    DDD (degenerative disc disease), lumbar    Chronic pain syndrome  -     Toxicology Screen, Urine; Future  -     Toxicology Screen, Urine    Exudative age-related macular degeneration of right eye, unspecified stage (CMS-HCC)    Essential hypertension, benign    Coronary artery disease involving autologous vein coronary bypass graft without angina pectoris    Chronic obstructive pulmonary disease, unspecified COPD type (CMS-HCC)    Giant cell arteritis (CMS-HCC)    Benign prostatic hyperplasia, unspecified whether lower urinary tract symptoms present    Dyslipidemia  -     Lipid Panel    Mild anemia    Elevated serum creatinine    Macrocytosis without anemia    Medication management  -     Toxicology Screen, Urine; Future  -     Toxicology Screen, Urine    Other orders  -     DNR (Do Not Resuscitate)      -BPH:  History of slow urinary flow- pt started on Flomax 0.4 mg at bedtime.  His symptoms remain stable.  His urinary stream is slow and he  denies retention symptoms.     - chronic low back pain in the setting of lumbar DDD:  the pt continues on percocet Rx at 120 tabs a month. Review of PDMP reflects med compliance.  Annual UDS done in 12/2021, confirmed presence of opioid and I  renewed his annual controlled substance contract at that time.   He is due for both today.                               - COPD history: pt continue to smoke occasionally.  He has had no chronic SOB symptoms but he was admitted to [facility] hospital in 10/2021 for COPD exacerbation in the setting of Rhinovirus infection and CAP.  He has had no formal PFT's.   Most inhalers prescribed in the past (namely Spiriva) have been to costly for the pt. He is currently not on duoneb (declined by pt) and he has the albuterol inhaler.  He uses albuterol inhaler twice a day.     - history of Giant Cell arteritis/ischemic optic neuropathy and left eye vision loss: he will continue follow up with his rheumatologist . His last visit there was in 07/2022 and encounter details reviewed with the pt today.  He is taking Actemra 162 mg SQ q other week . He has been doing well and denied eye pain, jaw or temporal pain. He had recent eye exam at [facility] and he had an abnormal exam and he was referred to a [facility] for age related MD of the right eye.  He underwent intravitreal avastin regimen at that time. He has an opth appt in 03/2023.     - HTN/CAD/Dyslipidemia history/mild renal insufficiency: pt remains normotensive and chest pain free on current medication regimen. His home BP readings are normal. He reports no dizziness or lightheadedness.  Patient had increased creatinine level to 1.39 in October/2023 and he was advised to reduce his losartan dose to half of 100 mg tablet daily.  Follow-up creatinine level was 1.19 in January/2024.Marland Kitchen   He has had normal home blood pressure readings on BP log review today.  I recommended screening US and  he is scheduled for this week.  He is currently not followed by cardiology. Lipid panel done 12/2021 revealed HDL/LDL of 55/66 and LFT's were normal in 12/2022.  He is due for annual lipid panel today.    -Macrocytic anemia: Noted on serial CBCs in recent past pt states he was a heavy drinker in the past but more recently reduced his daily alcohol consumption to 3 beers a day.  Repeat CBC done in 12/2022 revealed stable anemia with H/H of 10.7/31.5, MCV of 98 with normal WBC/plt counts.  He had normal, normal iron/ferritin, TSH/Free T4, folate and Vit B12 levels in 05/2022  He has no fatigue symptoms.       I personally spent 40 minutes, face to face, and non face to face, in the care of this patient, which includes pre , intra and post visit time on date of service.  All documented time is specific to EM and dose not include any procedures performed today.    Return in about 4 months (around 06/18/2023) for Recheck.    Subjective:     HPI  I last saw the patient in October/2023 for routine follow-up visit and the following medical conditions:  -BPH:  History of slow urinary flow- pt started on Flomax 0.4 mg at bedtime.  His symptoms remain stable.  His urinary stream is slow and he  denies retention symptoms.     - chronic low back pain in the setting of lumbar DDD:  the pt continues on percocet Rx at 120 tabs a month. Review of PDMP reflects med compliance.  Annual UDS done in 12/2021, confirmed presence of opioid and I  renewed his annual controlled substance contract at that time.   He is due for both today.                               - COPD history: pt continue to smoke occasionally.  He has had no chronic SOB symptoms but he was admitted to [facility] hospital in 10/2021 for COPD exacerbation in the setting of Rhinovirus infection and CAP.  He has had no formal PFT's.   Most inhalers prescribed in the past (namely Spiriva) have been to costly for the pt. He is currently not on duoneb (declined by pt) and he has the albuterol inhaler.  He uses albuterol inhaler twice a day.     - history of Giant Cell arteritis/ischemic optic neuropathy and left eye vision loss: he will continue follow up with his rheumatologist . His last visit there was in 07/2022 and encounter details reviewed with the pt today.  He is taking Actemra 162 mg SQ q other week . He has been doing well and denied eye pain, jaw or temporal pain. He had recent eye exam at [facility] and he had an abnormal exam and he was referred to a [place] [facility] for age related MD of the right eye.  He underwent intravitreal avastin regimen at that time. He has an opth appt in 03/2023.     - HTN/CAD/Dyslipidemia history/mild renal insufficiency: pt remains normotensive and chest pain free on current medication regimen. His home BP readings are normal. He reports no dizziness or lightheadedness.  Patient had increased creatinine level to 1.39 in October/2023 and he was advised to reduce his losartan dose to half of 100 mg tablet daily.  Follow-up creatinine level was 1.19 in January/2024.Marland Kitchen   He has had normal  home blood pressure readings on BP log review today.  I recommended screening US and he is scheduled for this week.  He is currently not followed by cardiology. Lipid panel done 12/2021 revealed HDL/LDL of 55/66 and LFT's were normal in 12/2022.  He is due for annual lipid panel today.    -Macrocytic anemia: Noted on serial CBCs in recent past pt states he was a heavy drinker in the past but more recently reduced his daily alcohol consumption to 3 beers a day.  Repeat CBC done in 12/2022 revealed stable anemia with H/H of 10.7/31.5, MCV of 98 with normal WBC/plt counts.  He had normal, normal iron/ferritin, TSH/Free T4, folate and Vit B12 levels in 05/2022  He has no fatigue symptoms.         ROS  Constitutional:  Denies  unexpected weight loss or gain, or weakness   Eyes:  Denies visual changes  Respiratory:  Denies cough or shortness of breath. No change in exercise  tolerance  Cardiovascular:  Denies chest pain, palpitations or lower extremity swelling   GI:  Denies abdominal pain, diarrhea, constipation   Musculoskeletal:  Denies myalgias  Skin:  Denies nonhealing lesions  Neurologic:  Denies headache, focal weakness or numbness, tingling  Endocrine:  Denies polyuria or polydypsia   Psychiatric:  Denies depression, anxiety      Outpatient Medications Prior to Visit   Medication Sig Dispense Refill    albuterol HFA 90 mcg/actuation inhaler Inhale 2 puffs every six (6) hours as needed for wheezing. 18 g 5    amLODIPine (NORVASC) 10 MG tablet Take 1 tablet (10 mg total) by mouth daily. 90 tablet 3    ascorbic acid (VITAMIN C) 500 MG tablet Take 1 tablet (500 mg total) by mouth daily.      aspirin (ECOTRIN) 81 MG tablet Take 1 tablet (81 mg total) by mouth daily.      atenoloL (TENORMIN) 50 MG tablet Take 1 tablet (50 mg total) by mouth Two (2) times a day. 180 tablet 3    calcium carbonate (OS-CAL) 1,500 mg (600 mg elem calcium) tablet Take 1 tablet (600 mg of elem calcium total) by mouth two (2) times a day.      calcium-vitamin D 500 mg-5 mcg (200 unit) per tablet Take 1 tablet by mouth daily.      cyanocobalamin, vitamin B-12, 1000 MCG tablet Take 1 tablet (1,000 mcg total) by mouth daily.      furosemide (LASIX) 20 MG tablet Take 0.5-1 tablets (10-20 mg total) by mouth daily as needed for swelling. 90 tablet 1    hydrALAZINE (APRESOLINE) 25 MG tablet Take 1 tablet (25 mg total) by mouth daily. 90 tablet 3    losartan (COZAAR) 100 MG tablet Take 1 tablet (100 mg total) by mouth daily. (Patient taking differently: Take 1 tablet (100 mg total) by mouth daily. 1/2 tab daily) 90 tablet 3    mv-mins/folic/lycopene/ginkgo (ONE-A-DAY MEN'S 50+ ADVANTAGE ORAL) Take by mouth.      oxyCODONE-acetaminophen (PERCOCET) 5-325 mg per tablet Take 1 tablet by mouth every six (6) hours as needed for pain. 120 tablet 0    simvastatin (ZOCOR) 10 MG tablet Take 1 tablet (10 mg total) by mouth daily. 90 tablet 3    tamsulosin (FLOMAX) 0.4 mg capsule Take 1 capsule (0.4 mg total) by mouth daily. 90 capsule 3    tocilizumab (ACTEMRA) 162 mg/0.9 mL Syrg subcutaneous injection Inject the contents of one syringe (162 mg total) under the  skin every fourteen (14) days. 5.4 mL 3    multivitamin (TAB-A-VITE/THERAGRAN) per tablet Take 1 tablet by mouth daily. (Patient not taking: Reported on 02/17/2023)       No facility-administered medications prior to visit.         Objective:       Vital Signs  BP 128/53  - Pulse 65  - Temp 36.1 ??C (97 ??F)  - Ht 180.3 cm (5' 11)  - Wt 64.5 kg (142 lb 1.6 oz)  - SpO2 94%  - BMI 19.82 kg/m??      Exam  General: normal appearance  EYES: Anicteric sclerae.  ENT: Oropharynx moist.  RESP: Relaxed respiratory effort. Clear to auscultation without wheezes or crackles.   CV: Regular rate and rhythm. Normal S1 and S2. No murmurs or gallops.  No lower extremity edema. Posterior tibial pulses are 2+ and symmetric.  abd exam: non tender, no masses, no HSM   MSK: No focal muscle tenderness.  SKIN: Appropriately warm and moist.  NEURO: Stable gait and coordination.    Allergies:     Patient has no known allergies.    Current Medications:     Current Outpatient Medications   Medication Sig Dispense Refill    albuterol HFA 90 mcg/actuation inhaler Inhale 2 puffs every six (6) hours as needed for wheezing. 18 g 5    amLODIPine (NORVASC) 10 MG tablet Take 1 tablet (10 mg total) by mouth daily. 90 tablet 3    ascorbic acid (VITAMIN C) 500 MG tablet Take 1 tablet (500 mg total) by mouth daily.      aspirin (ECOTRIN) 81 MG tablet Take 1 tablet (81 mg total) by mouth daily.      atenoloL (TENORMIN) 50 MG tablet Take 1 tablet (50 mg total) by mouth Two (2) times a day. 180 tablet 3    calcium carbonate (OS-CAL) 1,500 mg (600 mg elem calcium) tablet Take 1 tablet (600 mg of elem calcium total) by mouth two (2) times a day.      calcium-vitamin D 500 mg-5 mcg (200 unit) per tablet Take 1 tablet by mouth daily.      cyanocobalamin, vitamin B-12, 1000 MCG tablet Take 1 tablet (1,000 mcg total) by mouth daily.      furosemide (LASIX) 20 MG tablet Take 0.5-1 tablets (10-20 mg total) by mouth daily as needed for swelling. 90 tablet 1    hydrALAZINE (APRESOLINE) 25 MG tablet Take 1 tablet (25 mg total) by mouth daily. 90 tablet 3    losartan (COZAAR) 100 MG tablet Take 1 tablet (100 mg total) by mouth daily. (Patient taking differently: Take 1 tablet (100 mg total) by mouth daily. 1/2 tab daily) 90 tablet 3    mv-mins/folic/lycopene/ginkgo (ONE-A-DAY MEN'S 50+ ADVANTAGE ORAL) Take by mouth.      oxyCODONE-acetaminophen (PERCOCET) 5-325 mg per tablet Take 1 tablet by mouth every six (6) hours as needed for pain. 120 tablet 0    simvastatin (ZOCOR) 10 MG tablet Take 1 tablet (10 mg total) by mouth daily. 90 tablet 3    tamsulosin (FLOMAX) 0.4 mg capsule Take 1 capsule (0.4 mg total) by mouth daily. 90 capsule 3 tocilizumab (ACTEMRA) 162 mg/0.9 mL Syrg subcutaneous injection Inject the contents of one syringe (162 mg total) under the skin every fourteen (14) days. 5.4 mL 3    multivitamin (TAB-A-VITE/THERAGRAN) per tablet Take 1 tablet by mouth daily. (Patient not taking: Reported on 02/17/2023)       No current  facility-administered medications for this visit.           Note - This record has been created using [employer]. Chart creation errors have been sought, but may not always have been located. Such creation errors do not reflect on the standard of medical care.    Jenell Milliner, MD

## 2023-02-13 DIAGNOSIS — G894 Chronic pain syndrome: Principal | ICD-10-CM

## 2023-02-13 DIAGNOSIS — M5136 Other intervertebral disc degeneration, lumbar region: Principal | ICD-10-CM

## 2023-02-13 MED ORDER — OXYCODONE-ACETAMINOPHEN 5 MG-325 MG TABLET
ORAL_TABLET | Freq: Four times a day (QID) | ORAL | 0 refills | 30 days | PRN
Start: 2023-02-13 — End: 2023-03-15

## 2023-02-13 NOTE — Unmapped (Signed)
Addended by: Heron Sabins on: 02/13/2023 10:40 AM     Modules accepted: Orders

## 2023-02-13 NOTE — Unmapped (Signed)
Pt called requesting refills of rx Percocet. States almost out of pills. He would like a call back when this is sent in please.     [id number] [place], [place] - [address] Phone: [phone]   Fax: [phone]

## 2023-02-13 NOTE — Unmapped (Signed)
Patient is requesting the following refill  Requested Prescriptions     Pending Prescriptions Disp Refills    oxyCODONE-acetaminophen (PERCOCET) 5-325 mg per tablet 120 tablet 0     Sig: Take 1 tablet by mouth every six (6) hours as needed for pain.       Recent Visits  Date Type Provider Dept   10/17/22 Office Visit Jenell Milliner, MD [facility] [address] [facility]   06/17/22 Office Visit Jenell Milliner, MD [place] [facility] [address] [facility]   Showing recent visits within past 365 days with a meds authorizing provider and meeting all other requirements  Future Appointments  Date Type Provider Dept   02/17/23 Appointment Jenell Milliner, MD [place] [facility] [address] At [facility]   Showing future appointments within next 365 days with a meds authorizing provider and meeting all other requirements       Labs: Patient is requesting the following refill  Requested Prescriptions     Pending Prescriptions Disp Refills    oxyCODONE-acetaminophen (PERCOCET) 5-325 mg per tablet 120 tablet 0     Sig: Take 1 tablet by mouth every six (6) hours as needed for pain.       Last refill given on: 01/17/2023 with 120 count and 0 refills.     Recent Visits  Date Type Provider Dept   10/17/22 Office Visit Jenell Milliner, MD [place] [facility] [address] [facility]   06/17/22 Office Visit Jenell Milliner, MD [place] [facility] [address] [facility]   Showing recent visits within past 365 days with a meds authorizing provider and meeting all other requirements  Future Appointments  Date Type Provider Dept   02/17/23 Appointment Jenell Milliner, MD [place] [facility] [address] [facility]   Showing future appointments within next 365 days with a meds authorizing provider and meeting all other requirements       Opioid Monitoring   Urine Tox Screen 01/15/2022  Opiate Confirmation Test Last Drug Screen Date: Not Found  Last Opioid Dispensed Provider: Jenell Milliner, MD  Prescribed MEDD : 30  Last PDMP Review: 01/14/2023  1:00 PM  Last Opioid Pain Agreement Signed Date: 01/16/2022  Last Non Opioid Controlled Substance Pain Agreement Signed Date: Not Found    Naloxone Ordered: Not Found  Last OV: 10/17/2022

## 2023-02-14 NOTE — Unmapped (Signed)
[facility] [place] [place]

## 2023-02-16 MED ORDER — OXYCODONE-ACETAMINOPHEN 5 MG-325 MG TABLET
ORAL_TABLET | Freq: Four times a day (QID) | ORAL | 0 refills | 30 days | Status: CP | PRN
Start: 2023-02-16 — End: 2023-03-18

## 2023-02-17 ENCOUNTER — Ambulatory Visit: Admit: 2023-02-17 | Discharge: 2023-02-18 | Payer: MEDICARE

## 2023-02-17 DIAGNOSIS — M316 Other giant cell arteritis: Principal | ICD-10-CM

## 2023-02-17 DIAGNOSIS — J449 Chronic obstructive pulmonary disease, unspecified: Principal | ICD-10-CM

## 2023-02-17 DIAGNOSIS — R7989 Other specified abnormal findings of blood chemistry: Principal | ICD-10-CM

## 2023-02-17 DIAGNOSIS — D7589 Other specified diseases of blood and blood-forming organs: Principal | ICD-10-CM

## 2023-02-17 DIAGNOSIS — N400 Enlarged prostate without lower urinary tract symptoms: Principal | ICD-10-CM

## 2023-02-17 DIAGNOSIS — D649 Anemia, unspecified: Principal | ICD-10-CM

## 2023-02-17 DIAGNOSIS — I25810 Atherosclerosis of coronary artery bypass graft(s) without angina pectoris: Principal | ICD-10-CM

## 2023-02-17 DIAGNOSIS — G894 Chronic pain syndrome: Principal | ICD-10-CM

## 2023-02-17 DIAGNOSIS — M5136 Other intervertebral disc degeneration, lumbar region: Principal | ICD-10-CM

## 2023-02-17 DIAGNOSIS — H353210 Exudative age-related macular degeneration, right eye, stage unspecified: Principal | ICD-10-CM

## 2023-02-17 DIAGNOSIS — Z79899 Other long term (current) drug therapy: Principal | ICD-10-CM

## 2023-02-17 DIAGNOSIS — E785 Hyperlipidemia, unspecified: Principal | ICD-10-CM

## 2023-02-17 DIAGNOSIS — I10 Essential (primary) hypertension: Principal | ICD-10-CM

## 2023-02-17 LAB — LIPID PANEL
CHOLESTEROL/HDL RATIO SCREEN: 2.6 (ref 1.0–4.5)
CHOLESTEROL: 139 mg/dL (ref <=200)
HDL CHOLESTEROL: 53 mg/dL (ref 40–60)
LDL CHOLESTEROL CALCULATED: 64 mg/dL (ref 40–99)
NON-HDL CHOLESTEROL: 86 mg/dL (ref 70–130)
TRIGLYCERIDES: 108 mg/dL (ref 0–150)
VLDL CHOLESTEROL CAL: 21.6 mg/dL (ref 12–42)

## 2023-02-17 LAB — TOXICOLOGY SCREEN, URINE
AMPHETAMINE SCREEN URINE: NEGATIVE
BARBITURATE SCREEN URINE: NEGATIVE
BENZODIAZEPINE SCREEN, URINE: NEGATIVE
BUPRENORPHINE, URINE SCREEN: NEGATIVE
CANNABINOID SCREEN URINE: NEGATIVE
COCAINE(METAB.)SCREEN, URINE: NEGATIVE
FENTANYL SCREEN, URINE: NEGATIVE
METHADONE SCREEN, URINE: NEGATIVE
OPIATE SCREEN URINE: NEGATIVE
OXYCODONE SCREEN URINE: POSITIVE — AB

## 2023-02-18 NOTE — Unmapped (Signed)
Normal lipid panel

## 2023-02-19 NOTE — Unmapped (Signed)
UDS result consistent with patient's Percocet usage.

## 2023-02-20 ENCOUNTER — Ambulatory Visit: Admit: 2023-02-20 | Discharge: 2023-02-21 | Payer: MEDICARE

## 2023-02-20 NOTE — Unmapped (Signed)
Notify the pt that recent US reveals no kidney abnormality and more recent kidney lab work or creatinine level was improved  and essentially normal at 1.18.

## 2023-02-20 NOTE — Unmapped (Signed)
Patient has been notified of results.  

## 2023-03-13 DIAGNOSIS — M5136 Other intervertebral disc degeneration, lumbar region: Principal | ICD-10-CM

## 2023-03-13 DIAGNOSIS — G894 Chronic pain syndrome: Principal | ICD-10-CM

## 2023-03-13 MED ORDER — OXYCODONE-ACETAMINOPHEN 5 MG-325 MG TABLET
ORAL_TABLET | Freq: Four times a day (QID) | ORAL | 0 refills | 30 days | Status: CP | PRN
Start: 2023-03-13 — End: 2023-04-12

## 2023-03-13 NOTE — Unmapped (Signed)
Patient is requesting the following refill  Requested Prescriptions     Pending Prescriptions Disp Refills    oxyCODONE-acetaminophen (PERCOCET) 5-325 mg per tablet 120 tablet 0     Sig: Take 1 tablet by mouth every six (6) hours as needed for pain.       Recent Visits  Date Type Provider Dept   02/17/23 Office Visit Jenell Milliner, MD [place] [facility] [address] [facility]   10/17/22 Office Visit Jenell Milliner, MD [place] [facility] [address] [facility]   06/17/22 Office Visit Jenell Milliner, MD [place] [facility] [address] [facility]   Showing recent visits within past 365 days with a meds authorizing provider and meeting all other requirements  Future Appointments  Date Type Provider Dept   06/19/23 Appointment Jenell Milliner, MD [place] [facility] [address] [facility]   Showing future appointments within next 365 days with a meds authorizing provider and meeting all other requirements       Labs:

## 2023-03-13 NOTE — Unmapped (Addendum)
Patient reports that they have previously requested refill from pharmacy, but have not received authorization yet.   Patient is requesting the following medication(s) for refill: oxyCODONE-acetaminophen (PERCOCET) 5-325 mg per tablet [[id number]]  and a quantity for 1 month supply  Please called when filled.  [facility] address[id number],  - [address]  [address] [place], [place] [address]  Phone: [phone]  Fax: [phone]     Please contact patient by Cell Phone    Last office visit: 02/17/2023

## 2023-03-13 NOTE — Unmapped (Signed)
Addended by: Heron Sabins on: 03/13/2023 11:41 AM     Modules accepted: Orders

## 2023-04-10 DIAGNOSIS — G894 Chronic pain syndrome: Principal | ICD-10-CM

## 2023-04-10 DIAGNOSIS — M5136 Other intervertebral disc degeneration, lumbar region: Principal | ICD-10-CM

## 2023-04-10 NOTE — Unmapped (Signed)
Patient reports that they have previously requested refill from pharmacy, but have not received authorization yet.   Patient is requesting the following medication(s) for refill: oxyCODONE-acetaminophen (PERCOCET) 5-325 mg per tablet and a quantity for 1 month supply    Ph[facility] address[id number] - [place], [place] - [address]     Please contact patient by Cell Phone    Last office visit: 02/17/2023

## 2023-04-10 NOTE — Unmapped (Signed)
Addended by: Heron Sabins on: 04/10/2023 11:25 AM     Modules accepted: Orders

## 2023-04-14 MED ORDER — OXYCODONE-ACETAMINOPHEN 5 MG-325 MG TABLET
ORAL_TABLET | Freq: Four times a day (QID) | ORAL | 0 refills | 30 days | Status: CP | PRN
Start: 2023-04-14 — End: 2023-05-14

## 2023-05-01 ENCOUNTER — Ambulatory Visit: Admit: 2023-05-01 | Discharge: 2023-05-02 | Payer: MEDICARE

## 2023-05-01 DIAGNOSIS — M316 Other giant cell arteritis: Principal | ICD-10-CM

## 2023-05-01 DIAGNOSIS — Z79899 Other long term (current) drug therapy: Principal | ICD-10-CM

## 2023-05-01 NOTE — Unmapped (Signed)
You were seen by [facility] today.      [facility] at [facility]  [address]   [place], [place] [address]  Phone:  [phone]  Fax:  [phone]     Rheumatologist:  Dr. Ilsa Iha       Summary of Plan for 05/01/23    Diagnostic testing recommendations:  You can get blood work at your next PCP appointment, I have placed standing orders in the system for the labs I would like you to have done    Therapeutic / Treatment recommendations:   - We will decrease your tocilizumab from once every 2 weeks to once every 3 weeks    Referrals and follow-up recommendations:  Please follow-up with PCP and other specialists     When should I expect results?   Please note that it may take up to 21 days for results to return.  If you have not been notified by phone, mail or [facility] (if applicable) in 21 days, please contact our office at [phone] or via [facility] messaging [email or web address])     What do I do if I have questions or concerns after the visit?   If you have non-urgent questions, the best way to contact your provider is to send a MyChart message by visiting [email or web address].  You can also use MyChart to request refills and access test results.  Providers will do their best to respond within 2-3 business days, although it may take longer in some circumstances.  If you have immediate concerns, please contact our clinic by phone  at [phone].     Thank you for allowing [facility] to be involved in your care!

## 2023-05-01 NOTE — Unmapped (Unsigned)
[facility] return visit    ASSESSMENT/PLAN:  89 y.o. smoker with h/o COPD, CAD s/p CABG,  HTN, active alcohol use, GIB 2/2 duodenal ulcer (likely 2/2 concurrent ibuprofen, alcohol and prednisone use) presenting for follow-up of  biopsy proven giant cell arteritis  complicated by ischemic optic neuropathy/ vision loss in left eye (dx 11/2017)     He was last seen 07/2022 .     Currently taking tocilizumab 162 mg SQ q other week and tolerating without any side effects. Today he reports overall stable symptoms without vision changes. No s/s of active GCA or PMR.       - Continue tocilizumab 162 mg subcutaneous q2weeks.   - Recent CBC and CMP from 06/17/22 independently reviewed today and overall stable. Mild elevation in Crt but improved 07/01/22, PCP is following this.   -  Of note, patient with known h/o GIB/duodenal ulcer which does increase risk of GI perforation on tocilizumab - however, he is now >18 months out from this without any complications and wishes to continue treatment with tocilizumab  - Referred to [facility] to have eye exam. Stressed to pt the importance of regular eye exams with his h/o GCA. Pt verbalized understanding and agrees to schedule and attend visit with Ophthalmology.          ________________________________________________________      REASON FOR VISIT: F/U GCA    HISTORY: Erik [name] is a 89 y.o. 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      Last seen 07/2022 with Brennan Bailey     He is taking Actemra 162 mg SQ q other week.     Interim history:    He says he's doing fairly well.  He has not have any new headache.  He denies jaw pain or tenderness.  He to      Current Outpatient Medications   Medication Sig Dispense Refill arteritis complicated by ischemic optic neuropathy/ vision loss in left eye.      Last seen 07/2022 with Brennan Bailey     He is taking Actemra 162 mg SQ q14days.     Interim history:    He says he's doing fairly well.  He has not have any new headache.  He denies jaw pain or tenderness.\  He has not had any vision changes in the left eye.  He denies significant joint  pain or stiffness. No shoulder or hip girdle stiffness. No AM stiffness  He is tolerating the tocilizumab without issues.  Did have a shipping delay due to weather issues in the winter.  He missed a dose but did okay.       Record Review: Available records were reviewed, including pertinent office visits, labs, and imaging.      REVIEW OF SYSTEMS: Ten system were reviewed and negative except as noted above.    Past Medical History:   Diagnosis Date    Cataract 2005    cataract surgery    COPD (chronic obstructive pulmonary disease) (CMS-HCC) 10/07/2016    DJD (degenerative joint disease), lumbar     Hypertension     Macular degeneration of right eye 10/17/2022    Mild anemia 01/15/2022    Tobacco abuse 02/15/2014       Current Outpatient  Medications   Medication Sig Dispense Refill    albuterol HFA 90 mcg/actuation inhaler Inhale 2 puffs every six (6) hours as needed for wheezing. 18 g 5    amLODIPine (NORVASC) 10 MG tablet Take 1 tablet (10 mg total) by mouth daily. 90 tablet 3    ascorbic acid (VITAMIN C) 500 MG tablet Take 1 tablet (500 mg total) by mouth daily.      aspirin (ECOTRIN) 81 MG tablet Take 1 tablet (81 mg total) by mouth daily.      atenoloL (TENORMIN) 50 MG tablet Take 1 tablet (50 mg total) by mouth Two (2) times a day. 180 tablet 3    calcium carbonate (OS-CAL) 1,500 mg (600 mg elem calcium) tablet Take 1 tablet (600 mg of elem calcium total) by mouth two (2) times a day.      calcium-vitamin D 500 mg-5 mcg (200 unit) per tablet Take 1 tablet by mouth daily.      cyanocobalamin, vitamin B-12, 1000 MCG tablet Take 1 tablet (1,000 mcg medications for this visit.       Past Medical History:   Diagnosis Date    Cataract 2005    cataract surgery    COPD (chronic obstructive pulmonary disease) (CMS-HCC) 10/07/2016    DJD (degenerative joint disease), lumbar     Hypertension     Macular degeneration of right eye 10/17/2022    Mild anemia 01/15/2022    Tobacco abuse 02/15/2014        Record Review: Available records were reviewed, including pertinent office visits, labs, and imaging.      REVIEW OF SYSTEMS: Ten system were reviewed and negative except as noted above.    PHYSICAL EXAM:  VITAL SIGNS:   Vitals:    05/01/23 1513   BP: 160/54   Pulse: 64   Temp: 36.6 ??C (97.9 ??F)   Weight: 63.5 kg (140 lb)   Height: 157.5 cm (5' 2)       General:   Pleasant 89 y.o.male in no acute distress, WDWN   Eyes:   PERRL, conjunctiva and sclera not inflamed. Tears appear adequate.    ENT:   No oropharyngeal lesions. Mucous membranes moist. No temporal tenderness.    Lymph:   No masses or cervical lymphadenopathy.    Cardiovascular:  Regular rate and rhythm. No murmur, rub, or gallop. No lower extremity edema.    Lungs:  Clear to auscultation.Normal respiratory effort.    Musculoskeletal:   General: Ambulates w/o assistance   Hands: No swelling or tenderness. Able to make a tight fist b/l   Wrists:FROM w/o swelling or tenderness   Elbows: FROM w/o swelling or tenderness   Shoulders: FROM w/o pain   Knees: FROM w/o effusions   Ankles: No swelling or tenderness   Feet: No pain with MTP squeeze    Neurological:  CN 2-12 grossly intact. 5/5 strength on extremities.   Psych:  Appropriate affect and mood   Skin:  No rashes.       Labs/Imaging/Other:    Lab Results   Component Value Date    WBC 7.5 12/26/2022    HGB 10.7 (L) 12/26/2022    HCT 31.5 (L) 12/26/2022    PLT 169 12/26/2022       Lab Results   Component Value Date    NA 138 12/26/2022    K 4.3 12/26/2022    CL 104 12/26/2022    CO2 24.0 12/26/2022    BUN 15 12/26/2022  CREATININE 1.19 (H) 12/26/2022    GLU 98 12/26/2022    CALCIUM 8.7 12/26/2022    MG 1.6 10/27/2021       Lab Results   Component Value Date    BILITOT 0.5 12/26/2022    PROT 6.6 12/26/2022    ALBUMIN 4.3 12/26/2022    ALT 20 12/26/2022    AST 24 12/26/2022    ALKPHOS 56 12/26/2022       Lab Results   Component Value Date    PT 9.8 (L) 12/15/2017    INR 0.86 12/15/2017    APTT 27.1 (L) 12/15/2017

## 2023-05-09 DIAGNOSIS — G894 Chronic pain syndrome: Principal | ICD-10-CM

## 2023-05-09 DIAGNOSIS — M5136 Other intervertebral disc degeneration, lumbar region: Principal | ICD-10-CM

## 2023-05-09 MED ORDER — OXYCODONE-ACETAMINOPHEN 5 MG-325 MG TABLET
ORAL_TABLET | Freq: Four times a day (QID) | ORAL | 0 refills | 30 days | PRN
Start: 2023-05-09 — End: 2023-06-08

## 2023-05-09 NOTE — Unmapped (Signed)
The caller reports that they have previously requested refill from pharmacy, but have not received authorization yet.   The patient is requesting the following medication(s) for refill: oxyCODONE-acetaminophen (PERCOCET) 5-325 mg per tablet  and a quantity for 1 month supply    Pharmacy name and address:  [id number] - [place], [place] - [address]     Please contact The patient by Cell Phone in regards to this request.    Last office visit: 02/17/2023

## 2023-05-09 NOTE — Unmapped (Signed)
Addended by: Heron Sabins on: 05/09/2023 11:57 AM     Modules accepted: Orders

## 2023-05-09 NOTE — Unmapped (Signed)
Patient is requesting the following refill  Requested Prescriptions     Pending Prescriptions Disp Refills    oxyCODONE-acetaminophen (PERCOCET) 5-325 mg per tablet 120 tablet 0     Sig: Take 1 tablet by mouth every six (6) hours as needed for pain.       Last refill given on:  with  count and  refills.     Recent Visits  Date Type Provider Dept   02/17/23 Office Visit Jenell Milliner, MD [facility] [address] [facility]   10/17/22 Office Visit Jenell Milliner, MD [facility] [address] [facility]   06/17/22 Office Visit Jenell Milliner, MD [facility] [address] [facility]   Showing recent visits within past 365 days with a meds authorizing provider and meeting all other requirements  Future Appointments  Date Type Provider Dept   06/19/23 Appointment Jenell Milliner, MD [facility] [address] [facility]   Showing future appointments within next 365 days with a meds authorizing provider and meeting all other requirements       Opioid Monitoring   Urine Tox Screen Last Drug Screen Date: 02/17/2023  Opiate Confirmation Test Last Drug Screen Date: Not Found  Last Opioid Dispensed Provider: Jenell Milliner, MD  Prescribed MEDD : 30  Last PDMP Review: 01/14/2023  1:00 PM  Last Opioid Pain Agreement Signed Date: 03/06/2023  Last Non Opioid Controlled Substance Pain Agreement Signed Date: Not Found    Naloxone Ordered: Not Found  Last OV: 02/17/2023

## 2023-05-10 NOTE — Unmapped (Signed)
Regarding: NCscript issue,had called office yesterday but had not heard back  ----- Message from Melton Alar sent at 05/10/2023 12:33 PM EDT -----  If you had not called [facility], what do you think you would have done?   Not Appropriate to Ask

## 2023-05-10 NOTE — Unmapped (Signed)
Reason for Disposition  ??? Caller requesting a CONTROLLED substance prescription refill (e.g., narcotics, ADHD medicines)    Answer Assessment - Initial Assessment Questions  1. DRUG NAME: What medicine do you need to have refilled?      oxyCODONE-acetaminophen (PERCOCET) 5-325 mg, Take 1 tablet by mouth every six (6) hours as needed for pain.  2. REFILLS REMAINING: How many refills are remaining? (Note: The label on the medicine or pill bottle will show how many refills are remaining. If there are no refills remaining, then a renewal may be needed.)      0  3. EXPIRATION DATE: What is the expiration date? (Note: The label states when the prescription will expire, and thus can no longer be refilled.)      na  4. PRESCRIBING HCP: Who prescribed it? Reason: If prescribed by specialist, call should be referred to that group.      Jenell Milliner, MD  5. SYMPTOMS: Do you have any symptoms?      Na- declined triage  6. PREGNANCY: Is there any chance that you are pregnant? When was your last menstrual period?      Na      Says is not out of medication and has enough till Monday but was calling to be sure it gets called in on Monday.    Protocols used: Medication Refill and Renewal Call-A-AH

## 2023-05-10 NOTE — Unmapped (Signed)
Regarding: script issue,had called office yesterday but had not heard back  ----- Message from Benay Spice sent at 05/10/2023 12:03 PM EDT -----  If you had not called [facility], what do you think you would have done?   Not Appropriate to Ask

## 2023-05-10 NOTE — Unmapped (Signed)
Reason for Disposition  ??? Second attempt to contact caller AND no contact made. Phone number verified.    Protocols used: No Contact or Duplicate Contact Call-A-AH

## 2023-05-10 NOTE — Unmapped (Signed)
Regarding: Millen-script issue,had called office yesterday but had not heard back  ----- Message from Melton Alar sent at 05/10/2023 12:35 PM EDT -----  If you had not called [facility], what do you think you would have done?   Not Appropriate to Ask

## 2023-05-10 NOTE — Unmapped (Signed)
Reason for Disposition  ??? Second attempt to contact caller AND no contact made. Phone number verified.    Protocols used: No Contact or Duplicate Contact Call-A-AH

## 2023-05-12 NOTE — Unmapped (Signed)
11:10 am--Spoke with patient and notified him that [facility] [place] and should be ready for pick up tomorrow.

## 2023-05-12 NOTE — Unmapped (Signed)
Patient is requesting for the Nurse to contact them in regards to wanting to know the status on his refill request for his percocet. He would like to be called when the medication has been sent to the pharmacy so he can arrange transportation so he can pick it up.   Please contact Patient by Cell Phone

## 2023-05-13 MED ORDER — OXYCODONE-ACETAMINOPHEN 5 MG-325 MG TABLET
ORAL_TABLET | Freq: Four times a day (QID) | ORAL | 0 refills | 30 days | Status: CP | PRN
Start: 2023-05-13 — End: 2023-06-12

## 2023-05-26 NOTE — Unmapped (Signed)
[facility]  UNREACHABLE    Erik [name] is a 89 y.o. male identified as possibly having a proportion of days covered of less than 80%  for a hypertension medication, based on payer reports and chart review.       1st attempt call made to the patient's Cell number(s). There was no answer & voicemail was left to return call.       Population:  [employer]    Medication(s):    Blood pressure: LOSARTAN 100MG     Last Filled on 12/30/2022 for a 90 day supply  Refills remaining:YES      Additional Details & Actions Taken:   MyChart message sent and none      [name] , CPhT  Certified Pharmacy Technician  [facility]

## 2023-05-26 NOTE — Unmapped (Signed)
Abstraction Result Flowsheet Data    This patient's last AWV date: [facility] Last Medicare Wellness Visit Date: 01/15/2022  This patients last WCC/CPE date: : Not Found      Reason for Encounter  Reason for Encounter: Outreach  Primary Reason for Outreach: AWV  Text Message: No  MyChart Message: No  Outreach Call Outcome: No voicemail available

## 2023-06-01 MED ORDER — FUROSEMIDE 20 MG TABLET
ORAL_TABLET | 0 refills | 0 days
Start: 2023-06-01 — End: ?

## 2023-06-02 MED ORDER — FUROSEMIDE 20 MG TABLET
ORAL_TABLET | Freq: Every day | ORAL | 0 refills | 90 days | Status: CP | PRN
Start: 2023-06-02 — End: 2024-06-01

## 2023-06-10 DIAGNOSIS — G894 Chronic pain syndrome: Principal | ICD-10-CM

## 2023-06-10 DIAGNOSIS — M5136 Other intervertebral disc degeneration, lumbar region: Principal | ICD-10-CM

## 2023-06-10 NOTE — Unmapped (Signed)
Addended by: Barbaraann Boys on: 06/10/2023 10:41 AM     Modules accepted: Orders

## 2023-06-10 NOTE — Unmapped (Addendum)
The caller reports that they have previously requested refill from pharmacy, but have not received authorization yet.   The patient is requesting the following medication(s) for refill: oxyCODONE-acetaminophen (PERCOCET) 5-325 mg per tablet [[id number]]  and a quantity for 1 month supply    [phone] [place], [place] - [address]  [address] [place], [place] [address]  Phone: [phone]  Fax: [phone]     Please contact The patient by Cell Phone in regards to this request.    Last office visit: 02/17/2023    Please call back after it has been confirmed so patient can arrange transport to pick it up.

## 2023-06-10 NOTE — Unmapped (Signed)
Patient is requesting the following refill  Requested Prescriptions     Pending Prescriptions Disp Refills    oxyCODONE-acetaminophen (PERCOCET) 5-325 mg per tablet 120 tablet 0     Sig: Take 1 tablet by mouth every six (6) hours as needed for pain.       Last refill given on: 05/12/23 with 120 count and 0 refills.     Recent Visits  Date Type Provider Dept   02/17/23 Office Visit Jenell Milliner, MD [facility] [address] [facility]   10/17/22 Office Visit Jenell Milliner, MD [facility] [address] [facility]   06/17/22 Office Visit Jenell Milliner, MD [facility] [address] At [facility]   Showing recent visits within past 365 days with a meds authorizing provider and meeting all other requirements  Future Appointments  Date Type Provider Dept   06/19/23 Appointment Jenell Milliner, MD [facility] [address] [facility]   Showing future appointments within next 365 days with a meds authorizing provider and meeting all other requirements       Opioid Monitoring   Urine Tox Screen Last Drug Screen Date: 02/17/2023  Opiate Confirmation Test Last Drug Screen Date: Not Found  Last Opioid Dispensed Provider: Jenell Milliner, MD  Prescribed MEDD : 30  Last PDMP Review: 06/10/2023 10:40 AM  Last Opioid Pain Agreement Signed Date: 03/06/2023  Last Non Opioid Controlled Substance Pain Agreement Signed Date: Not Found    Naloxone Ordered: Not Found  Last OV: 02/17/2023

## 2023-06-12 MED ORDER — OXYCODONE-ACETAMINOPHEN 5 MG-325 MG TABLET
ORAL_TABLET | Freq: Four times a day (QID) | ORAL | 0 refills | 30 days | Status: CP | PRN
Start: 2023-06-12 — End: 2023-07-12

## 2023-06-17 MED ORDER — FUROSEMIDE 20 MG TABLET
ORAL_TABLET | Freq: Every day | ORAL | 0 refills | 90 days | Status: CP | PRN
Start: 2023-06-17 — End: 2024-06-16

## 2023-06-17 NOTE — Unmapped (Signed)
Assessment/Plan:    Erik [name] was seen today for follow-up.    Diagnoses and all orders for this visit:    Exudative age-related macular degeneration of right eye, unspecified stage (CMS-HCC)    DDD (degenerative disc disease), lumbar  -     oxyCODONE-acetaminophen (PERCOCET) 5-325 mg per tablet; Take 1 tablet by mouth every six (6) hours as needed for pain.    Chronic pain syndrome  -     oxyCODONE-acetaminophen (PERCOCET) 5-325 mg per tablet; Take 1 tablet by mouth every six (6) hours as needed for pain.    Coronary artery disease involving autologous vein coronary bypass graft without angina pectoris  -     simvastatin (ZOCOR) 10 MG tablet; Take 1 tablet (10 mg total) by mouth daily.    Slow urinary stream  -     tamsulosin (FLOMAX) 0.4 mg capsule; Take 1 capsule (0.4 mg total) by mouth daily.    Essential hypertension, benign  -     hydrALAZINE (APRESOLINE) 25 MG tablet; Take 1 tablet (25 mg total) by mouth daily.  -     losartan (COZAAR) 100 MG tablet; Take 1 tablet (100 mg total) by mouth daily. 1/2 tab daily  -     atenolol (TENORMIN) 50 MG tablet; Take 1 tablet (50 mg total) by mouth two (2) times a day.  -     amlodipine (NORVASC) 10 MG tablet; Take 1 tablet (10 mg total) by mouth daily.    Chronic obstructive pulmonary disease, unspecified COPD type (CMS-HCC)  -     ipratropium-albuterol (DUO-NEB) 0.5-2.5 mg/3 mL nebulizer; Inhale 3 mL by nebulization two (2) times a day.  -     budesonide (PULMICORT) 0.5 mg/2 mL nebulizer solution; Inhale 2 mL (0.5 mg total) by nebulization two (2) times a day.  -     albuterol HFA 90 mcg/actuation inhaler; Inhale 2 puffs every six (6) hours as needed for wheezing.    Benign prostatic hyperplasia, unspecified whether lower urinary tract symptoms present    Dyslipidemia    Mild anemia    Other orders  -     furosemide (LASIX) 20 MG tablet; Take 0.5-1 tablets (10-20 mg total) by mouth daily as needed for swelling.        -BPH:  History of slow urinary flow- pt started on Flomax 0.4 mg at bedtime.  His symptoms remain stable.  His urinary stream is slow and he  denies retention symptoms.     - chronic low back pain in the setting of lumbar DDD:  the pt continues on percocet Rx at 120 tabs a month. Review of PDMP reflects med compliance.  Annual UDS done in 01/2023, confirmed presence of opioid and I  renewed his annual controlled substance contract at that time.  I sent Rx today which is due for 07/12/2023.                                 - COPD history: pt has stopped smoking.  He has had no formal PFT's.   Most inhalers prescribed in the past (namely Spiriva) have been to costly for the pt. He is currently not on duoneb (declined by pt) and he has the albuterol inhaler.  He uses albuterol inhaler twice a day or more.  I convinced the pt to start nebulizer Rx with duoneb and pulmicort bid. Otherwise, he declines pulmonary evaluation to assess extent of disease.     -  history of Giant Cell arteritis/ischemic optic neuropathy and left eye vision loss: he will continue follow up with his rheumatologist . His last visit there was in 04/2023 and encounter details reviewed with the pt today.  He is taking Actemra 162 mg SQ q other week . He has been doing well and denied eye pain, jaw or temporal pain. He had recent eye exam at [facility] and he had an abnormal exam and he was referred to a [facility] for age related MD of the right eye.  He underwent intravitreal avastin regimen at that time. He is also followed by [facility] opth .      - HTN/CAD/Dyslipidemia history/mild renal insufficiency: pt remains normotensive and chest pain free on current medication regimen. His home BP readings are normal. He reports no dizziness or lightheadedness.  Patient had increased creatinine level to 1.39 in October/2023 and he was advised to reduce his losartan dose to half of 100 mg tablet daily.  Follow-up creatinine level was 1.19 in January/2024.Marland Kitchen   He has had normal home blood pressure readings on BP log review today.  He was referred for screening abdominal ultrasound which revealed no acute renal pathology, of note cholelithiasis was noted with no evidence of acute cholecystitis.   He is currently not followed by cardiology. Lipid panel done 12/2021 revealed HDL/LDL of 33/64 and LFT's were normal in 12/2022.     -Macrocytic anemia: Noted on serial CBCs in recent past pt states he was a heavy drinker in the past but more recently reduced his daily alcohol consumption to 3 beers a day.  Repeat CBC done in 12/2022 revealed stable anemia with H/H of 10.7/31.5, MCV of 98 with normal WBC/plt counts.  He had normal, normal iron/ferritin, TSH/Free T4, folate and Vit B12 levels in 05/2022  He has no fatigue symptoms.     I personally spent 40 minutes, face to face, and non face to face, in the care of this patient, which includes pre , intra and post visit time on date of service.  All documented time is specific to EM and dose not include any procedures performed today.      Return in about 1 month (around 07/19/2023) for Recheck 40 min visit with Dr Rosine Beat.    Subjective:     HPI  Patient is seen today for routine follow-up visit.  I last saw the patient in February/2024 with the following medical conditions addressed:    -BPH:  History of slow urinary flow- pt started on Flomax 0.4 mg at bedtime.  His symptoms remain stable.  His urinary stream is slow and he  denies retention symptoms.     - chronic low back pain in the setting of lumbar DDD:  the pt continues on percocet Rx at 120 tabs a month. Review of PDMP reflects med compliance.  Annual UDS done in 01/2023, confirmed presence of opioid and I  renewed his annual controlled substance contract at that time.                                  - COPD history: pt continue to smoke occasionally.  He has had no chronic SOB symptoms but he was admitted to [facility] hospital in 10/2021 for COPD exacerbation in the setting of Rhinovirus infection and CAP.  He has had no formal PFT's.   Most inhalers prescribed in the past (namely Spiriva) have been to  costly for the pt. He is currently not on duoneb (declined by pt) and he has the albuterol inhaler.  He uses albuterol inhaler twice a day.     - history of Giant Cell arteritis/ischemic optic neuropathy and left eye vision loss: he will continue follow up with his rheumatologist . His last visit there was in 04/2023 and encounter details reviewed with the pt today.  He is taking Actemra 162 mg SQ q other week . He has been doing well and denied eye pain, jaw or temporal pain. He had recent eye exam at [facility] and he had an abnormal exam and he was referred to a [place] [facility] for age related MD of the right eye.  He underwent intravitreal avastin regimen at that time.      - HTN/CAD/Dyslipidemia history/mild renal insufficiency: pt remains normotensive and chest pain free on current medication regimen. His home BP readings are normal. He reports no dizziness or lightheadedness.  Patient had increased creatinine level to 1.39 in October/2023 and he was advised to reduce his losartan dose to half of 100 mg tablet daily.  Follow-up creatinine level was 1.19 in January/2024.Marland Kitchen   He has had normal home blood pressure readings on BP log review today.  He was referred for screening abdominal ultrasound which revealed no acute renal pathology, of note cholelithiasis was noted with no evidence of acute cholecystitis.   He is currently not followed by cardiology. Lipid panel done 12/2021 revealed HDL/LDL of 33/64 and LFT's were normal in 12/2022.     -Macrocytic anemia: Noted on serial CBCs in recent past pt states he was a heavy drinker in the past but more recently reduced his daily alcohol consumption to 3 beers a day.  Repeat CBC done in 12/2022 revealed stable anemia with H/H of 10.7/31.5, MCV of 98 with normal WBC/plt counts.  He had normal, normal iron/ferritin, TSH/Free T4, folate and Vit B12 levels in 05/2022  He has no fatigue symptoms.               ROS  Constitutional:  Denies  unexpected weight loss or gain, or weakness   Eyes:  Denies visual changes  Respiratory:  Denies cough or shortness of breath. No change in exercise  tolerance  Cardiovascular:  Denies chest pain, palpitations or lower extremity swelling   GI:  Denies abdominal pain, diarrhea, constipation   Musculoskeletal:  Denies myalgias  Skin:  Denies nonhealing lesions  Neurologic:  Denies headache, focal weakness or numbness, tingling  Endocrine:  Denies polyuria or polydypsia   Psychiatric:  Denies depression, anxiety      Outpatient Medications Prior to Visit   Medication Sig Dispense Refill    ascorbic acid (VITAMIN C) 500 MG tablet Take 1 tablet (500 mg total) by mouth daily.      aspirin (ECOTRIN) 81 MG tablet Take 1 tablet (81 mg total) by mouth daily.      calcium carbonate (OS-CAL) 1,500 mg (600 mg elem calcium) tablet Take 1 tablet (600 mg elem calcium total) by mouth two (2) times a day.      calcium-vitamin D 500 mg-5 mcg (200 unit) per tablet Take 1 tablet by mouth daily.      cyanocobalamin, vitamin B-12, 1000 MCG tablet Take 1 tablet (1,000 mcg total) by mouth daily.      mv-mins/folic/lycopene/ginkgo (ONE-A-DAY MEN'S 50+ ADVANTAGE ORAL) Take by mouth.      tocilizumab (ACTEMRA) 162 mg/0.9 mL Syrg subcutaneous injection Inject the contents  of one syringe (162 mg total) under the skin every fourteen (14) days. (Patient taking differently: Inject 0.9 mL (162 mg total) under the skin every twenty-one (21) days.) 5.4 mL 3    albuterol HFA 90 mcg/actuation inhaler Inhale 2 puffs every six (6) hours as needed for wheezing. 18 g 5    amLODIPine (NORVASC) 10 MG tablet Take 1 tablet (10 mg total) by mouth daily. 90 tablet 3    atenoloL (TENORMIN) 50 MG tablet Take 1 tablet (50 mg total) by mouth Two (2) times a day. 180 tablet 3    furosemide (LASIX) 20 MG tablet Take 0.5-1 tablets (10-20 mg total) by mouth daily as needed for swelling. 90 tablet 0 hydrALAZINE (APRESOLINE) 25 MG tablet Take 1 tablet (25 mg total) by mouth daily. 90 tablet 3    losartan (COZAAR) 100 MG tablet Take 1 tablet (100 mg total) by mouth daily. (Patient taking differently: Take 1 tablet (100 mg total) by mouth daily. 1/2 tab daily) 90 tablet 3    oxyCODONE-acetaminophen (PERCOCET) 5-325 mg per tablet Take 1 tablet by mouth every six (6) hours as needed for pain. 120 tablet 0    simvastatin (ZOCOR) 10 MG tablet Take 1 tablet (10 mg total) by mouth daily. 90 tablet 3    tamsulosin (FLOMAX) 0.4 mg capsule Take 1 capsule (0.4 mg total) by mouth daily. 90 capsule 3    multivitamin (TAB-A-VITE/THERAGRAN) per tablet Take 1 tablet by mouth daily. (Patient not taking: Reported on 06/19/2023)       No facility-administered medications prior to visit.         Objective:       Vital Signs  BP 120/50  - Pulse 69  - Temp 36.9 ??C (98.4 ??F)  - Ht 157.5 cm (5' 2)  - Wt 60.3 kg (133 lb)  - SpO2 90%  - BMI 24.33 kg/m??      Exam  General: normal appearance  EYES: Anicteric sclerae.  ENT: Oropharynx moist.  RESP: Relaxed respiratory effort. Clear to auscultation without wheezes or crackles.   CV: Regular rate and rhythm. Normal S1 and S2. No murmurs or gallops.  No lower extremity edema. Posterior tibial pulses are 2+ and symmetric.  abd exam: non tender, no masses, no HSM   MSK: No focal muscle tenderness.  SKIN: Appropriately warm and moist.  NEURO: Stable gait and coordination.    Allergies:     Patient has no known allergies.    Current Medications:     Current Outpatient Medications   Medication Sig Dispense Refill    ascorbic acid (VITAMIN C) 500 MG tablet Take 1 tablet (500 mg total) by mouth daily.      aspirin (ECOTRIN) 81 MG tablet Take 1 tablet (81 mg total) by mouth daily.      calcium carbonate (OS-CAL) 1,500 mg (600 mg elem calcium) tablet Take 1 tablet (600 mg elem calcium total) by mouth two (2) times a day.      calcium-vitamin D 500 mg-5 mcg (200 unit) per tablet Take 1 tablet by mouth daily.      cyanocobalamin, vitamin B-12, 1000 MCG tablet Take 1 tablet (1,000 mcg total) by mouth daily.      mv-mins/folic/lycopene/ginkgo (ONE-A-DAY MEN'S 50+ ADVANTAGE ORAL) Take by mouth.      tocilizumab (ACTEMRA) 162 mg/0.9 mL Syrg subcutaneous injection Inject the contents of one syringe (162 mg total) under the skin every fourteen (14) days. (Patient taking differently: Inject 0.9 mL (162 mg total)  under the skin every twenty-one (21) days.) 5.4 mL 3    albuterol HFA 90 mcg/actuation inhaler Inhale 2 puffs every six (6) hours as needed for wheezing. 18 g 5    amlodipine (NORVASC) 10 MG tablet Take 1 tablet (10 mg total) by mouth daily. 90 tablet 3    atenolol (TENORMIN) 50 MG tablet Take 1 tablet (50 mg total) by mouth two (2) times a day. 180 tablet 3    budesonide (PULMICORT) 0.5 mg/2 mL nebulizer solution Inhale 2 mL (0.5 mg total) by nebulization two (2) times a day. 120 mL 11    furosemide (LASIX) 20 MG tablet Take 0.5-1 tablets (10-20 mg total) by mouth daily as needed for swelling. 90 tablet 1    hydrALAZINE (APRESOLINE) 25 MG tablet Take 1 tablet (25 mg total) by mouth daily. 90 tablet 3    ipratropium-albuterol (DUO-NEB) 0.5-2.5 mg/3 mL nebulizer Inhale 3 mL by nebulization two (2) times a day. 3 mL 11    losartan (COZAAR) 100 MG tablet Take 1 tablet (100 mg total) by mouth daily. 1/2 tab daily 45 tablet 3    multivitamin (TAB-A-VITE/THERAGRAN) per tablet Take 1 tablet by mouth daily. (Patient not taking: Reported on 06/19/2023)      [START ON 07/12/2023] oxyCODONE-acetaminophen (PERCOCET) 5-325 mg per tablet Take 1 tablet by mouth every six (6) hours as needed for pain. 120 tablet 0    simvastatin (ZOCOR) 10 MG tablet Take 1 tablet (10 mg total) by mouth daily. 90 tablet 3    tamsulosin (FLOMAX) 0.4 mg capsule Take 1 capsule (0.4 mg total) by mouth daily. 90 capsule 3     No current facility-administered medications for this visit.           Note - This record has been created using [employer]. Chart creation errors have been sought, but may not always have been located. Such creation errors do not reflect on the standard of medical care.    Jenell Milliner, MD

## 2023-06-19 ENCOUNTER — Ambulatory Visit: Admit: 2023-06-19 | Discharge: 2023-06-20 | Payer: MEDICARE

## 2023-06-19 DIAGNOSIS — I10 Essential (primary) hypertension: Principal | ICD-10-CM

## 2023-06-19 DIAGNOSIS — D649 Anemia, unspecified: Principal | ICD-10-CM

## 2023-06-19 DIAGNOSIS — R39198 Other difficulties with micturition: Principal | ICD-10-CM

## 2023-06-19 DIAGNOSIS — H353210 Exudative age-related macular degeneration, right eye, stage unspecified: Principal | ICD-10-CM

## 2023-06-19 DIAGNOSIS — M5136 Other intervertebral disc degeneration, lumbar region: Principal | ICD-10-CM

## 2023-06-19 DIAGNOSIS — N400 Enlarged prostate without lower urinary tract symptoms: Principal | ICD-10-CM

## 2023-06-19 DIAGNOSIS — J449 Chronic obstructive pulmonary disease, unspecified: Principal | ICD-10-CM

## 2023-06-19 DIAGNOSIS — G894 Chronic pain syndrome: Principal | ICD-10-CM

## 2023-06-19 DIAGNOSIS — E785 Hyperlipidemia, unspecified: Principal | ICD-10-CM

## 2023-06-19 DIAGNOSIS — I25810 Atherosclerosis of coronary artery bypass graft(s) without angina pectoris: Principal | ICD-10-CM

## 2023-06-19 MED ORDER — FUROSEMIDE 20 MG TABLET
ORAL_TABLET | Freq: Every day | ORAL | 1 refills | 90 days | Status: CP | PRN
Start: 2023-06-19 — End: 2024-06-18

## 2023-06-19 MED ORDER — LOSARTAN 100 MG TABLET
ORAL_TABLET | Freq: Every day | ORAL | 3 refills | 45 days | Status: CP
Start: 2023-06-19 — End: 2024-06-18

## 2023-06-19 MED ORDER — BUDESONIDE 0.5 MG/2 ML SUSPENSION FOR NEBULIZATION
Freq: Two times a day (BID) | RESPIRATORY_TRACT | 11 refills | 30 days | Status: CP
Start: 2023-06-19 — End: 2024-06-18

## 2023-06-19 MED ORDER — ATENOLOL 50 MG TABLET
ORAL_TABLET | Freq: Two times a day (BID) | ORAL | 3 refills | 90 days | Status: CP
Start: 2023-06-19 — End: 2024-06-18

## 2023-06-19 MED ORDER — HYDRALAZINE 25 MG TABLET
ORAL_TABLET | Freq: Every day | ORAL | 3 refills | 90 days | Status: CP
Start: 2023-06-19 — End: 2024-06-18

## 2023-06-19 MED ORDER — TAMSULOSIN 0.4 MG CAPSULE
ORAL_CAPSULE | Freq: Every day | ORAL | 3 refills | 90 days | Status: CP
Start: 2023-06-19 — End: ?

## 2023-06-19 MED ORDER — ALBUTEROL SULFATE HFA 90 MCG/ACTUATION AEROSOL INHALER
Freq: Four times a day (QID) | RESPIRATORY_TRACT | 5 refills | 0 days | Status: CP | PRN
Start: 2023-06-19 — End: 2024-06-18

## 2023-06-19 MED ORDER — AMLODIPINE 10 MG TABLET
ORAL_TABLET | Freq: Every day | ORAL | 3 refills | 90 days | Status: CP
Start: 2023-06-19 — End: ?

## 2023-06-19 MED ORDER — IPRATROPIUM 0.5 MG-ALBUTEROL 3 MG (2.5 MG BASE)/3 ML NEBULIZATION SOLN
Freq: Two times a day (BID) | RESPIRATORY_TRACT | 11 refills | 1 days | Status: CP
Start: 2023-06-19 — End: 2024-06-18

## 2023-06-19 MED ORDER — SIMVASTATIN 10 MG TABLET
ORAL_TABLET | Freq: Every day | ORAL | 3 refills | 90 days | Status: CP
Start: 2023-06-19 — End: 2024-06-19

## 2023-06-19 NOTE — Unmapped (Signed)
Losartan 100 mg tablet clarification. RX has two sets of directions, 1 tab daily/half tablet daily.  Spoke with Vernona Rieger to clarify patient is taking 1/2 tablet daily.

## 2023-06-24 NOTE — Unmapped (Signed)
Called unable to leave message , no VM set up

## 2023-07-02 DIAGNOSIS — M316 Other giant cell arteritis: Principal | ICD-10-CM

## 2023-07-02 MED ORDER — ACTEMRA 162 MG/0.9 ML SUBCUTANEOUS SYRINGE
SUBCUTANEOUS | 3 refills | 126 days | Status: CP
Start: 2023-07-02 — End: ?

## 2023-07-02 NOTE — Unmapped (Signed)
Last Visit Date: 05/01/2023  Next Visit Date: 11/05/2023    Lab Results   Component Value Date    ALT 20 12/26/2022    AST 24 12/26/2022    ALBUMIN 4.3 12/26/2022    CREATININE 1.19 (H) 12/26/2022     Lab Results   Component Value Date    WBC 7.5 12/26/2022    HGB 10.7 (L) 12/26/2022    HCT 31.5 (L) 12/26/2022    PLT 169 12/26/2022     Lab Results   Component Value Date    NEUTROPCT 51.5 12/26/2022    LYMPHOPCT 28.4 12/26/2022    MONOPCT 12.2 12/26/2022    EOSPCT 7.0 12/26/2022    BASOPCT 0.9 12/26/2022

## 2023-07-08 NOTE — Unmapped (Signed)
Abstraction Result Flowsheet Data    This patient's last AWV date: [facility] Last Medicare Wellness Visit Date: 01/15/2022  This patients last WCC/CPE date: : Not Found      Reason for Encounter  Reason for Encounter: Outreach  Primary Reason for Outreach: AWV  Text Message: No  MyChart Message: No  Outreach Call Outcome: No voicemail available

## 2023-07-12 MED ORDER — OXYCODONE-ACETAMINOPHEN 5 MG-325 MG TABLET
ORAL_TABLET | Freq: Four times a day (QID) | ORAL | 0 refills | 30 days | Status: CP | PRN
Start: 2023-07-12 — End: 2023-08-11

## 2023-07-22 ENCOUNTER — Ambulatory Visit
Admit: 2023-07-22 | Discharge: 2023-07-23 | Payer: MEDICARE | Attending: Student in an Organized Health Care Education/Training Program | Primary: Student in an Organized Health Care Education/Training Program

## 2023-07-22 DIAGNOSIS — I25810 Atherosclerosis of coronary artery bypass graft(s) without angina pectoris: Principal | ICD-10-CM

## 2023-07-22 DIAGNOSIS — E785 Hyperlipidemia, unspecified: Principal | ICD-10-CM

## 2023-07-22 DIAGNOSIS — J441 Chronic obstructive pulmonary disease with (acute) exacerbation: Principal | ICD-10-CM

## 2023-07-22 DIAGNOSIS — M316 Other giant cell arteritis: Principal | ICD-10-CM

## 2023-07-22 DIAGNOSIS — Z79899 Other long term (current) drug therapy: Principal | ICD-10-CM

## 2023-07-22 DIAGNOSIS — I10 Essential (primary) hypertension: Principal | ICD-10-CM

## 2023-07-22 DIAGNOSIS — N400 Enlarged prostate without lower urinary tract symptoms: Principal | ICD-10-CM

## 2023-07-22 LAB — CBC W/ AUTO DIFF
BASOPHILS ABSOLUTE COUNT: 0.1 10*9/L (ref 0.0–0.1)
BASOPHILS RELATIVE PERCENT: 0.9 %
EOSINOPHILS ABSOLUTE COUNT: 0.5 10*9/L (ref 0.0–0.5)
EOSINOPHILS RELATIVE PERCENT: 7.1 %
HEMATOCRIT: 36.2 % — ABNORMAL LOW (ref 39.0–48.0)
HEMOGLOBIN: 12.2 g/dL — ABNORMAL LOW (ref 12.9–16.5)
LYMPHOCYTES ABSOLUTE COUNT: 1.8 10*9/L (ref 1.1–3.6)
LYMPHOCYTES RELATIVE PERCENT: 26 %
MEAN CORPUSCULAR HEMOGLOBIN CONC: 33.8 g/dL (ref 32.0–36.0)
MEAN CORPUSCULAR HEMOGLOBIN: 33.5 pg — ABNORMAL HIGH (ref 25.9–32.4)
MEAN CORPUSCULAR VOLUME: 98.9 fL — ABNORMAL HIGH (ref 77.6–95.7)
MEAN PLATELET VOLUME: 8.2 fL (ref 6.8–10.7)
MONOCYTES ABSOLUTE COUNT: 0.8 10*9/L (ref 0.3–0.8)
MONOCYTES RELATIVE PERCENT: 11.8 %
NEUTROPHILS ABSOLUTE COUNT: 3.7 10*9/L (ref 1.8–7.8)
NEUTROPHILS RELATIVE PERCENT: 54.2 %
PLATELET COUNT: 183 10*9/L (ref 150–450)
RED BLOOD CELL COUNT: 3.66 10*12/L — ABNORMAL LOW (ref 4.26–5.60)
RED CELL DISTRIBUTION WIDTH: 13.1 % (ref 12.2–15.2)
WBC ADJUSTED: 6.9 10*9/L (ref 3.6–11.2)

## 2023-07-22 LAB — CREATININE
CREATININE: 1.25 mg/dL — ABNORMAL HIGH
EGFR CKD-EPI (2021) MALE: 56 mL/min/{1.73_m2} — ABNORMAL LOW (ref >=60)

## 2023-07-22 LAB — ALT: ALT (SGPT): 16 U/L (ref 10–49)

## 2023-07-22 LAB — AST: AST (SGOT): 19 U/L (ref <=34)

## 2023-07-22 LAB — BUN: BLOOD UREA NITROGEN: 17 mg/dL (ref 9–23)

## 2023-07-22 LAB — ALBUMIN: ALBUMIN: 4.3 g/dL (ref 3.4–5.0)

## 2023-07-22 NOTE — Unmapped (Signed)
Assessment and Plan:     89 yo male presents today to follow up/ meet and greet  -He had been following with Dr. Vinson Moselle for the last >8 years. Unfortunately she is retiring   -Mr. Lavery is well known to our practice     Giant cell arteritis (CMS-HCC)/Ischemic Optic Neuropathy, Exudative age-related macular degeneration of right eye, unspecified stage (CMS-HCC), Left eye vision loss, Long-term current use of tocilizumab  -Following closely with Rheumatology with last visit 05/01/2023: Assessment/Plan:   Currently taking tocilizumab 162 mg SQ q other week and tolerating without any side effects. Today he reports overall stable symptoms without vision changes. No s/s of active GCA or PMR.   Given clinical stability and duration of therapy, discussed further taper with patient.  He is agreeable to this plan   - Taper tocilizumab 162 mg subcutaneous q2weeks ---> q3weeks; if stable at next visit, recommend taper to q4weeks   - Will follow up labs from PCP visit scheduled late 05/2023   -  Of note, patient with known h/o GIB/duodenal ulcer which does increase risk of GI perforation on tocilizumab - however, he is now >18 months out from this without any complications and wishes to continue treatment with tocilizumab  - Reviewed signs and sx of PMR and GCA with patient, advised to reach out to clinic should symptoms occur with taper of toclilizumab  -He additionally follows previously with [facility] and previously saw [facility]  -Labs below being obtained for his rheumatologist  -     Creatinine  -     CBC w/ Differential  -     BUN  -     AST  -     ALT  -     Albumin    Coronary artery disease involving autologous vein coronary bypass graft without angina pectoris, Essential hypertension, benign,   Dyslipidemia  BP Readings from Last 3 Encounters:   07/22/23 138/60   06/19/23 120/50   05/01/23 160/54   -ACEi/ARB/ARNI: Losartan 50mg  (taking 1/2 tablet)   -BB: Atenolol 50mg  BID  -CCB: Amlodipine 10mg  every day   -Diuretic: Furosemide 20mg  PRN  -Additional Anti-hypertensive: Hydralazine 25mg  every day   -Lipid-lowering agents: Simvastatin 10mg  every day   -Anti-platelet therapy: Aspirin 81mg  every day   -He brought in his BP log at home and his blood pressure has remained well within normal limits at home. I discussed possible titration down/off one of his anti-hypertensive; however he does not want to do that at this time.   -Continue current regimen above      COPD exacerbation (CMS-HCC)  -Former smoker. He has not had any formal PFT   -He has used [place] in the past but was quite expensive  -He was given Duoneb/Albuterol nebulizer and Pulmicort. These therapies have greatly helped his breathing  -He has previously declined pulmonology referral/evaluation.     Benign prostatic hyperplasia, unspecified whether lower urinary tract symptoms present  -Chronic but appears stable   -Continue Tamsulosin  -Will discuss obtaining PSA in the future; however unsure if this would change our management at this time as well.     DDD (degenerative disc disease), lumbar  -Chronic in nature. Currently taking: Percocet 5mg  q6hrs PRN   -Pain Contract signed:02/17/2023, UDS: 02/17/2023    Macrocytic anemia  -former heavy drinker  -Will continue to monitor for now     HEALTH MAINTENANCE  -CT Abdomen/pelvis w/ IV contrast from 11/2017 noted that he had osteopenia. DEXA scan  from 2019 did reveal this as well. Will offer repeating DEXA scan. Continue Calcium and Vitamin D Supplement  -Today there were no medication changes at this time.    -Chronic care follow up in 3 months    I personally spent 30 minutes face-to-face and non-face-to-face in the care of this patient, which includes all pre, intra, and post visit time on the date of service.  All documented time was specific to the E/M visit and does not include any procedures that may have been performed.          Subjective:     Erik [name] 89 y.o.male  is being seen for follow up     He check his BP at home   -His BP at home for the last month: BP range 110 to <130/<60  -home cuff: Arm cuff  -Home cuff is calibrated per patient   -he does not want to change his blood pressure medications but states that he is worried because of his blood pressure    He has been using a nebulizer which has helped his breathing     He sleeps well at night  His wife died around 13-14years ago,  son died 1 year ago, daughter 3 years ago, he has 2 additional son  -He has 9 grandchildren and 9 great-grand children     He was in the [employer]     No pets  Hobbies: scratch offs        ROS:     Review of systems negative unless otherwise noted as per HPI  He deneis lightheadedness, dizziness, chest pain   Denies dysuria     Vital Signs:     Wt Readings from Last 3 Encounters:   07/22/23 60.4 kg (133 lb 1.6 oz)   06/19/23 60.3 kg (133 lb)   05/01/23 63.5 kg (140 lb)     Temp Readings from Last 3 Encounters:   07/22/23 36.1 ??C (97 ??F) (Temporal)   06/19/23 36.9 ??C (98.4 ??F)   05/01/23 36.6 ??C (97.9 ??F)     BP Readings from Last 3 Encounters:   07/22/23 138/60   06/19/23 120/50   05/01/23 160/54     Pulse Readings from Last 3 Encounters:   07/22/23 70   06/19/23 69   05/01/23 64       Objective:     General: normal appearance, not toxic or ill appearing   EYES: Anicteric sclerae. Wearing glasses  ENT: Oropharynx moist.  RESP: Relaxed respiratory effort. Somewhat decreased air movement but no cough, wheeze, rhonchi, rales   CV: Regular rate and rhythm.  No murmurs on exam  Ext: Trace pitting edema noted in lower extremities  MSK: No focal muscle tenderness. Surgical scar on right knee. Arthritic changes noted to hands and knees. Gait is stable without use of assistance devicesSKIN: Appropriately warm and moist.  NEURO: Stable gait and coordination.

## 2023-08-08 DIAGNOSIS — M5136 Other intervertebral disc degeneration, lumbar region: Principal | ICD-10-CM

## 2023-08-08 DIAGNOSIS — G894 Chronic pain syndrome: Principal | ICD-10-CM

## 2023-08-08 NOTE — Unmapped (Signed)
Patient is requesting the following refill  Requested Prescriptions     Pending Prescriptions Disp Refills    oxyCODONE-acetaminophen (PERCOCET) 5-325 mg per tablet 120 tablet 0     Sig: Take 1 tablet by mouth every six (6) hours as needed for pain.       Last refill given on: 06/19/23 with 120 count and 0 refills.     Recent Visits  Date Type Provider Dept   07/22/23 Office Visit Mangel, Benison Pap, DO [facility] [address] [facility]   06/19/23 Office Visit Jenell Milliner, MD [facility] [address] [facility]   02/17/23 Office Visit Jenell Milliner, MD [facility] [address] [facility]   10/17/22 Office Visit Jenell Milliner, MD [facility] [address] [facility]   Showing recent visits within past 365 days and meeting all other requirements  Future Appointments  Date Type Provider Dept   10/31/23 Appointment Mangel, Benison Pap, DO  [facility] [address] [facility]   Showing future appointments within next 365 days and meeting all other requirements       Opioid Monitoring   Urine Tox Screen Last Drug Screen Date: 02/17/2023  Opiate Confirmation Test Last Drug Screen Date: Not Found  Last Opioid Dispensed Provider: Jenell Milliner, MD  Prescribed MEDD : 30  Last PDMP Review: 06/10/2023 10:40 AM  Last Opioid Pain Agreement Signed Date: 03/06/2023  Last Non Opioid Controlled Substance Pain Agreement Signed Date: Not Found    Naloxone Ordered: Not Found  Last OV: 07/22/2023

## 2023-08-08 NOTE — Unmapped (Signed)
Patient called to request a refill for medication listed below.    oxyCODONE-acetaminophen (PERCOCET) 5-325 mg per tablet [[id number]]

## 2023-08-11 NOTE — Unmapped (Signed)
Abstraction Result Flowsheet Data    This patient's last AWV date: [facility] Last Medicare Wellness Visit Date: 01/15/2022  This patients last WCC/CPE date: : Not Found      Reason for Encounter  Reason for Encounter: Outreach  Primary Reason for Outreach: AWV  Text Message: No  MyChart Message: No  Outreach Call Outcome: No voicemail available

## 2023-08-11 NOTE — Unmapped (Signed)
Patient called because his refill should be refillable today and he can not get it until tomorrow.  Please call to assist

## 2023-08-12 MED ORDER — OXYCODONE-ACETAMINOPHEN 5 MG-325 MG TABLET
ORAL | 0 refills | 30 days | Status: CP | PRN
Start: 2023-08-12 — End: 2023-09-11

## 2023-08-20 NOTE — Unmapped (Signed)
Patient was not seen today.

## 2023-09-03 NOTE — Unmapped (Signed)
Abstraction Result Flowsheet Data    This patient's last AWV date: [facility] Last Medicare Wellness Visit Date: 01/15/2022  This patients last WCC/CPE date: : Not Found      Reason for Encounter  Reason for Encounter: Outreach  Primary Reason for Outreach: AWV  Text Message: No  MyChart Message: No  Outreach Call Outcome: No voicemail available

## 2023-09-08 DIAGNOSIS — M5136 Other intervertebral disc degeneration, lumbar region: Principal | ICD-10-CM

## 2023-09-08 DIAGNOSIS — G894 Chronic pain syndrome: Principal | ICD-10-CM

## 2023-09-08 MED ORDER — OXYCODONE-ACETAMINOPHEN 5 MG-325 MG TABLET
ORAL | 0 refills | 30 days | Status: CP | PRN
Start: 2023-09-08 — End: 2023-10-08

## 2023-09-08 NOTE — Unmapped (Signed)
Addended by: Karie Georges PAP on: 09/08/2023 01:55 PM     Modules accepted: Orders

## 2023-09-08 NOTE — Unmapped (Signed)
Addended by: Barbaraann Boys on: 09/08/2023 01:46 PM     Modules accepted: Orders

## 2023-09-08 NOTE — Unmapped (Signed)
Patient is requesting the following refill  Requested Prescriptions     Pending Prescriptions Disp Refills    oxyCODONE-acetaminophen (PERCOCET) 5-325 mg per tablet 120 tablet 0     Sig: Take 1 tablet by mouth every six (6) hours as needed for pain.       Last refill given on: 08/12/23 with 120 count and 0 refills.     Recent Visits  Date Type Provider Dept   07/22/23 Office Visit Mangel, Benison Pap, DO [place] [facility] [address] [facility]   06/19/23 Office Visit Jenell Milliner, MD [place] [facility] [address] [facility]   02/17/23 Office Visit Jenell Milliner, MD [place] [facility] [address] [facility]   10/17/22 Office Visit Jenell Milliner, MD [place] [facility] [address] At [facility]   Showing recent visits within past 365 days and meeting all other requirements  Future Appointments  Date Type Provider Dept   10/31/23 Appointment Mangel, Benison Pap, DO [place] [facility] [address] [facility]   Showing future appointments within next 365 days and meeting all other requirements       Opioid Monitoring   Urine Tox Screen Last Drug Screen Date: 02/17/2023  Opiate Confirmation Test Last Drug Screen Date: Not Found  Last Opioid Dispensed Provider: Benison Pap Mangel, DO  Prescribed MEDD : 30  Last PDMP Review: 09/08/2023  1:45 PM  Last Opioid Pain Agreement Signed Date: 03/06/2023  Last Non Opioid Controlled Substance Pain Agreement Signed Date: Not Found    Naloxone Ordered: Not Found  Last OV: 07/22/2023

## 2023-09-08 NOTE — Unmapped (Signed)
Pt called requesting refills of rx Percocet. States that he needs these to be filled accordingly so he does not go without pills. States he has enough until 09/11/23. Pt states provider needs to make it every 30 days. States last refills was 31 days and he does not want that to happen again, states provider need to be on time with his pain med refills. He would like a call back to discuss, upset that he has a problem with this every month and wants to know exactly when this is sent in as it also affects his transportation.           [id number] [place], [place] - [address] Phone: [phone]   Fax: [phone]

## 2023-10-02 NOTE — Unmapped (Signed)
Abstraction Result Flowsheet Data    This patient's last AWV date: [facility] Last Medicare Wellness Visit Date: 01/15/2022  This patients last WCC/CPE date: : Not Found      Reason for Encounter  Reason for Encounter: Outreach  Primary Reason for Outreach: AWV  Text Message: No  MyChart Message: Yes  Outreach Call Outcome: No voicemail available

## 2023-10-06 MED ORDER — OXYCODONE-ACETAMINOPHEN 5 MG-325 MG TABLET
ORAL_TABLET | ORAL | 0 refills | 30 days | Status: CP | PRN
Start: 2023-10-06 — End: 2023-11-05

## 2023-10-06 NOTE — Unmapped (Signed)
Patient is requesting the following refill  Requested Prescriptions     Signed Prescriptions Disp Refills    oxyCODONE-acetaminophen (PERCOCET) 5-325 mg per tablet 120 tablet 0     Sig: Take 1 tablet by mouth every six (6) hours as needed for pain.     Authorizing Provider: Karie Georges PAP       Recent Visits  Date Type Provider Dept   07/22/23 Office Visit Mangel, Benison Pap, DO [place] [facility] [address] [facility]   06/19/23 Office Visit Jenell Milliner, MD [place] [facility] [address] [facility]   02/17/23 Office Visit Jenell Milliner, MD [place] [facility] [address] [facility]   10/17/22 Office Visit Jenell Milliner, MD [place] [facility] [address] [facility]   Showing recent visits within past 365 days and meeting all other requirements  Future Appointments  Date Type Provider Dept   10/31/23 Appointment Mangel, Benison Pap, DO [place] [facility] [address] [facility]   Showing future appointments within next 365 days and meeting all other requirements       Labs: Not applicable this refill

## 2023-10-06 NOTE — Unmapped (Signed)
Addended by: Azucena Cecil, Anasofia Micallef on: 10/06/2023 10:28 AM     Modules accepted: Orders

## 2023-10-06 NOTE — Unmapped (Signed)
Copied from CRM #[id number]. Topic: Access To Clinicians - Medication Refill  >> Oct 06, 2023 10:13 AM Andee Poles wrote:    The patient is requesting the following medication(s) for refill:      1. oxyCODONE-acetaminophen (PERCOCET) 5-325 mg per tablet [[id number]]    Pharmacy name and address: Please send to Pharmacy on file    Please contact the patient when medication has been sent in

## 2023-10-09 NOTE — Unmapped (Signed)
Abstraction Result Flowsheet Data    This patient's last AWV date: [facility] Last Medicare Wellness Visit Date: 01/15/2022  This patients last WCC/CPE date: : Not Found      Reason for Encounter  Reason for Encounter: Outreach  Primary Reason for Outreach: AWV  Text Message: No  MyChart Message: No  Outreach Call Outcome: Declined

## 2023-10-28 NOTE — Unmapped (Signed)
Copied from CRM #[id number]. Topic: Scheduling - New Appointment  >> Oct 28, 2023  8:55 AM Crystal B wrote:  Caller asked to schedule a new appointment  I offered patient appointment: No. Patient was offered triage based on red word: Severe pain (headache, abdominal). patient declined triage for the following reason other. Patient is notified of options: can call back to speak with triage nurse, can seek services for medical care.   Message is routed to clinical staff and team lead as high priority per protocol.     Patient has an appt scheduled with PCP for 11/8, mentioned he is using a heating pad and his pain medication, but states having bad back pain, and is unsure of if he may need to go to hospital.

## 2023-10-28 NOTE — Unmapped (Signed)
He should be evaluated---either to an urgent care or the ER. I am not in clinic today

## 2023-10-28 NOTE — Unmapped (Signed)
Spoke with patient and he is aware of providers message.

## 2023-10-30 NOTE — Unmapped (Signed)
Copied from CRM #[id number]. Topic: Access To Clinicians - Req Clinic Call Back  >> Oct 30, 2023 10:44 AM Durel Salts wrote:  The patient is requesting for the Front Desk to contact them in regards to patient would like a return call from the FD. He declined to give any other information  Please contact The patient by Cell Phone      Routine callback turnaround time: 24-48 business hours. Programmer, systems Notified)

## 2023-10-31 ENCOUNTER — Institutional Professional Consult (permissible substitution)
Admit: 2023-10-31 | Discharge: 2023-11-01 | Payer: MEDICARE | Attending: Student in an Organized Health Care Education/Training Program | Primary: Student in an Organized Health Care Education/Training Program

## 2023-10-31 DIAGNOSIS — R109 Unspecified abdominal pain: Principal | ICD-10-CM

## 2023-10-31 DIAGNOSIS — Z9181 History of falling: Principal | ICD-10-CM

## 2023-10-31 DIAGNOSIS — K5900 Constipation, unspecified: Principal | ICD-10-CM

## 2023-10-31 DIAGNOSIS — G8929 Other chronic pain: Principal | ICD-10-CM

## 2023-10-31 DIAGNOSIS — M316 Other giant cell arteritis: Principal | ICD-10-CM

## 2023-10-31 DIAGNOSIS — I25810 Atherosclerosis of coronary artery bypass graft(s) without angina pectoris: Principal | ICD-10-CM

## 2023-10-31 DIAGNOSIS — M5440 Lumbago with sciatica, unspecified side: Principal | ICD-10-CM

## 2023-10-31 MED ORDER — OMEPRAZOLE 40 MG CAPSULE,DELAYED RELEASE
ORAL_CAPSULE | Freq: Every day | ORAL | 1 refills | 30 days | Status: CP
Start: 2023-10-31 — End: 2023-12-30

## 2023-10-31 NOTE — Unmapped (Signed)
Assessment and Plan:   Diagnoses and all orders for this visit:    Constipation, unspecified constipation type  -     omeprazole (PRILOSEC) 40 MG capsule; Take 1 capsule (40 mg total) by mouth daily.    Chronic bilateral low back pain with sciatica, sciatica laterality unspecified    Giant cell arteritis (CMS-HCC)    Coronary artery disease involving autologous vein coronary bypass graft without angina pectoris    Risk for falls    Abdominal pain, unspecified abdominal location  -     omeprazole (PRILOSEC) 40 MG capsule; Take 1 capsule (40 mg total) by mouth daily.    COPD exacerbation (CMS-HCC)    Benign prostatic hyperplasia, unspecified whether lower urinary tract symptoms present    ----  89 yo male presents via PHONE VISIT to discuss acute concerns     Constipation, Abdominal pain   -Mr. Birge noted that he has had pain between his rib cage and hip but denies melena, hematochezia or actual abdominal pain. He verbalized concern for possible ulcer and would like to be treated for this.   -We discussed concern that this could be something more severe and I recommended obtaining a CT scan. However he DECLINED this option. He refused to go to the ER for evaluation.   -He has tried OTC therapies without success.   -We will trial Omeprazole (PPI) to see if this will help his symptoms. He verbalized that he would contact me in the next 1-2 weeks or if his symptoms get worse and we would obtain CT scan.   -I would not recommend long term PPI use given he does have osteopenia.   -Emergency room precautions were discussed in depth with patient as well.     Giant cell arteritis (CMS-HCC)/Ischemic Optic Neuropathy, Exudative age-related macular degeneration of right eye, unspecified stage (CMS-HCC), Left eye vision loss, Long-term current use of tocilizumab   -Following with Rheumatology as well as Ophthalmology.  -Last rheumatology  05/01/2023:    Currently taking tocilizumab 162 mg SQ q other week and tolerating without any side effects. Today he reports overall stable symptoms without vision changes. No s/s of active GCA or PMR.   Given clinical stability and duration of therapy, discussed further taper with patient.  He is agreeable to this plan   - Taper tocilizumab 162 mg subcutaneous q2weeks ---> q3weeks; if stable at next visit, recommend taper to q4weeks   - Will follow up labs from PCP visit scheduled late 05/2023   -  Of note, patient with known h/o GIB/duodenal ulcer which does increase risk of GI perforation on tocilizumab - however, he is now >18 months out from this without any complications and wishes to continue treatment with tocilizumab  - Reviewed signs and sx of PMR and GCA with patient, advised to reach out to clinic should symptoms occur with taper of toclilizumab  -Rheumatology visit scheduled on 11/05/2023.    Coronary artery disease involving autologous vein coronary bypass graft without angina pectoris, Essential hypertension, benign, Dyslipidemia  -He does closely monitor his BP at home  ACEi/ARB/ARNI: Losartan 50mg  (taking 1/2 tablet of 100mg )   -BB: Atenolol 50mg  BID  -CCB: Amlodipine 10mg  every day   -Diuretic: Furosemide 20mg  PRN  -Additional Anti-hypertensive: Hydralazine 25mg  every day   -Lipid-lowering agents: Simvastatin 10mg  every day   -Anti-platelet therapy: Aspirin 81mg  every day     Chronic back pain, DDD  -Chronic in nature. Currently taking: Percocet 5mg  q6hrs PRN   -Pain Contract  signed:02/17/2023, UDS: 02/17/2023    BPH  -chronic, stable. Currently taking: Tamsulosin 0.4mg  every day     HEALTH MAINTENANCE   -COPD exacerbation:  Former smoker. Currently using Duoneb/albuterol nebulizer and Pulmicort which has helped his breathing. He has previously DECLINED referral to pulmonology   -Will discuss repeating DEXA scan in the future as last DEXA scan in 2019 noted Osteopenia  -He does continue to smoke cigarettes intermittently.      Chronic care follow up in 3-4 months    Subjective: Erik [name] 89 y.o.male   is here via phone visit for follow up     The patient reports they are physically located in [place] and is currently: at home. I conducted a phone visit.  I spent 13 minutes on the phone call with the patient on the date of service .       He notes the other day he developed pain between his rib cage and hip bones.   When he presses on this, he will pass gas/flatus and the pain will let up   He has had BM and denies melena or hematochezia     He then took Milk of Magnesia and had 5 BM a day--he denies any melena or hematochezia   He took pepto   He does continue to have abdominal pain     He does take an OTC medication for pain (meg-a-lite tablets) as well as takes percocet for pain   He notes that there could be an ulcer    Around 5-6 years ago he was constipated for 5 days after having a abdominal surgery  He notes that the pain right now is not as severe as that  He believes that it could be an ulcer      He just used his nebulizer which has helped him a lot     ROS:     Review of systems negative unless otherwise noted as per HPI    Vital Signs:     Wt Readings from Last 3 Encounters:   10/31/23 61.2 kg (135 lb)   07/22/23 60.4 kg (133 lb 1.6 oz)   06/19/23 60.3 kg (133 lb)     Temp Readings from Last 3 Encounters:   07/22/23 36.1 ??C (97 ??F) (Temporal)   06/19/23 36.9 ??C (98.4 ??F)   05/01/23 36.6 ??C (97.9 ??F)     BP Readings from Last 3 Encounters:   10/31/23 130/58   07/22/23 138/60   06/19/23 120/50     Pulse Readings from Last 3 Encounters:   07/22/23 70   06/19/23 69   05/01/23 64       Objective:     Physical exam limited as performed over the phone     Const: Does not sound to be in distress   Nose: no nasal congestion   Neuro: Aox3  Psych: pleasant

## 2023-11-03 DIAGNOSIS — G894 Chronic pain syndrome: Principal | ICD-10-CM

## 2023-11-03 DIAGNOSIS — M51369 DDD (degenerative disc disease), lumbar: Principal | ICD-10-CM

## 2023-11-03 NOTE — Unmapped (Signed)
Copied from CRM #[id number]. Topic: Access To Clinicians - Medication Refill  >> Nov 03, 2023  8:39 AM Crista Luria H wrote:  The caller reports that they have previously requested refill from pharmacy, but have not received authorization yet.     The patient is requesting the following:     Medication(s) for refill: oxyCODONE-acetaminophen (PERCOCET) 5-325 mg per tablet   Quantity for 1 month supply    [facility] address[id number] - [place], [place] - [address]        Please contact The patient by Cell Phone in regards to this request.    Coverage has been verified and updated if needed: yes    Medication request callback turnaround time: 72 business hours. Programmer, systems Notified)

## 2023-11-03 NOTE — Unmapped (Signed)
[facility] return visit    ASSESSMENT/PLAN:  89 y.o. smoker with h/o COPD, CAD s/p CABG,  HTN, active alcohol use, GIB 2/2 duodenal ulcer (likely 2/2 concurrent ibuprofen, alcohol and prednisone use) presenting for follow-up of  biopsy proven giant cell arteritis  complicated by ischemic optic neuropathy/ vision loss in left eye (dx 11/2017)     He was last seen 05/01/2023 with Dr. Ilsa Iha    Currently taking tocilizumab 162 mg SQ q 21 days. Today reporting overall stable symptoms. No GCA/PMR symptoms.   - Taper tocilizumab 162 mg subcutaneous to q4weeks   - Update monitoring labs: CBCw/diff, Cr, AST, ALT  - Of note, patient with known h/o GIB/duodenal ulcer which does increase risk of GI perforation on tocilizumab - however, he is now >18 months out from this without any complications and wishes to continue treatment with tocilizumab  - Reviewed signs and sx of PMR and GCA with patient, advised to reach out to clinic should symptoms occur with taper of toclilizumab  - Continue F/U with Ophthalmology. Reports upcoming visit 11/2023.    F/U in 6 months with myself in 1 year with Dr. Ilsa Iha    I personally spent 30 minutes face-to-face and non-face-to-face in the care of this patient, which includes all pre, intra, and post visit time on the date of service.  All documented time was specific to the E/M visit and does not include any procedures that may have been performed.  ________________________________________________________      REASON FOR VISIT: F/U GCA    HISTORY: Erik [name] is a 89 y.o. 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      Last seen 05/01/2023 with Dr. Ilsa Iha    He is taking Actemra 162 mg SQ q 21days.     Interim history:  Pt presents for f/u today. He has been taking Actemra every three weeks and tolerating. He denies recent headache, vision changes or jaw pain. No recent fever, illness or infections. Minimal AM stiffness.     Record Review: Available records were reviewed, including pertinent office visits, labs, and imaging.      REVIEW OF SYSTEMS: Ten system were reviewed and negative except as noted above.    Past Medical History:   Diagnosis Date    Cataract 2005    cataract surgery    COPD (chronic obstructive pulmonary disease) (CMS-HCC) 10/07/2016    DJD (degenerative joint disease), lumbar     Hypertension     Macular degeneration of right eye 10/17/2022    Mild anemia 01/15/2022    Tobacco abuse 02/15/2014       Current Outpatient Medications   Medication Sig Dispense Refill    albuterol HFA 90 mcg/actuation inhaler Inhale 2 puffs every six (6) hours as needed for wheezing. 18 g 5    amlodipine (NORVASC) 10 MG tablet Take 1 tablet (10 mg total) by mouth daily. 90 tablet 3    ascorbic acid (VITAMIN C) 500 MG tablet Take 1 tablet (500 mg total) by mouth daily.      aspirin (ECOTRIN) 81 MG tablet Take 1 tablet (81 mg total) by mouth daily.      atenolol (TENORMIN) 50 MG tablet Take 1 tablet (50 mg total) by mouth two (2) times a  day. 180 tablet 3    budesonide (PULMICORT) 0.5 mg/2 mL nebulizer solution Inhale 2 mL (0.5 mg total) by nebulization two (2) times a day. 120 mL 11    calcium carbonate (OS-CAL) 1,500 mg (600 mg elem calcium) tablet Take 1 tablet (600 mg elem calcium total) by mouth two (2) times a day.      calcium-vitamin D 500 mg-5 mcg (200 unit) per tablet Take 1 tablet by mouth daily.      cyanocobalamin, vitamin B-12, 1000 MCG tablet Take 1 tablet (1,000 mcg total) by mouth daily.      furosemide (LASIX) 20 MG tablet Take 0.5-1 tablets (10-20 mg total) by mouth daily as needed for swelling. 90 tablet 1    hydrALAZINE (APRESOLINE) 25 MG tablet Take 1 tablet (25 mg total) by mouth daily. 90 tablet 3    ipratropium-albuterol (DUO-NEB) 0.5-2.5 mg/3 mL nebulizer Inhale 3 mL by nebulization two (2) times a day. 3 mL 11    losartan (COZAAR) 100 MG tablet Take 1 tablet (100 mg total) by mouth daily. 1/2 tab daily 45 tablet 3    mv-mins/folic/lycopene/ginkgo (ONE-A-DAY MEN'S 50+ ADVANTAGE ORAL) Take by mouth.      omeprazole (PRILOSEC) 40 MG capsule Take 1 capsule (40 mg total) by mouth daily. 30 capsule 1    oxyCODONE-acetaminophen (PERCOCET) 5-325 mg per tablet Take 1 tablet by mouth every six (6) hours as needed for pain. 120 tablet 0    simvastatin (ZOCOR) 10 MG tablet Take 1 tablet (10 mg total) by mouth daily. 90 tablet 3    tamsulosin (FLOMAX) 0.4 mg capsule Take 1 capsule (0.4 mg total) by mouth daily. 90 capsule 3    tocilizumab (ACTEMRA) 162 mg/0.9 mL Syrg subcutaneous injection Inject 0.9 mL (162 mg total) under the skin every twenty-one (21) days. 5.4 mL 3     No current facility-administered medications for this visit.       Allergies:  No Known Allergies    Social History     Tobacco Use    Smoking status: Some Days     Current packs/day: 0.00     Types: Cigarettes    Smokeless tobacco: Never    Tobacco comments:     quit for 25 years, restarted in 2014.  Pt states he smokes less than 1 cigarette per week since 10/2021   Vaping Use    Vaping status: Never Used   Substance Use Topics    Alcohol use: Yes     Alcohol/week: 2.0 standard drinks of alcohol     Types: 2 Cans of beer per week     Comment: daily    Drug use: No         PHYSICAL EXAM:  VITAL SIGNS:   Vitals:    11/05/23 1058   BP: 144/55   Pulse: 64   Temp: 36.2 ??C (97.2 ??F)   TempSrc: Temporal   Weight: 62.8 kg (138 lb 6.4 oz)         General:   Pleasant 89 y.o.male in no acute distress, WDWN   Eyes:   Wearing glasses, conjunctiva and sclera not inflamed. Tears appear adequate.    ENT:   No oropharyngeal lesions. Mucous membranes moist. No temporal tenderness.   No TA tenderness  No scalp tenderness   Lymph:   No masses or cervical lymphadenopathy.    Cardiovascular:  Regular rate and rhythm. No murmur, rub, or gallop. No lower extremity edema.   Radial pulses 2+ bilaterally  Lungs:  Clear to auscultation.Normal respiratory effort.    Musculoskeletal:   General: Ambulates w/o assistance   Hands: No swelling or tenderness. Able to make a tight fist b/l   Wrists:FROM w/o swelling or tenderness   Elbows: FROM w/o swelling or tenderness   Shoulders: FROM w/o pain   Knees: FROM w/o effusions   Ankles: No swelling or tenderness    Neurological:  CN 2-12 grossly intact. Strength grossly intact.    Psych:  Appropriate affect and mood   Skin:  No rashes.       Labs/Imaging/Other:    Lab Results   Component Value Date    WBC 6.9 07/22/2023    HGB 12.2 (L) 07/22/2023    HCT 36.2 (L) 07/22/2023    PLT 183 07/22/2023       Lab Results   Component Value Date    NA 138 12/26/2022    K 4.3 12/26/2022    CL 104 12/26/2022    CO2 24.0 12/26/2022    BUN 17 07/22/2023    CREATININE 1.25 (H) 07/22/2023    GLU 98 12/26/2022    CALCIUM 8.7 12/26/2022    MG 1.6 10/27/2021       Lab Results   Component Value Date    BILITOT 0.5 12/26/2022    PROT 6.6 12/26/2022    ALBUMIN 4.3 07/22/2023    ALT 16 07/22/2023    AST 19 07/22/2023    ALKPHOS 56 12/26/2022       Lab Results   Component Value Date    PT 9.8 (L) 12/15/2017    INR 0.86 12/15/2017    APTT 27.1 (L) 12/15/2017

## 2023-11-03 NOTE — Unmapped (Signed)
Patient is requesting the following refill  Requested Prescriptions     Pending Prescriptions Disp Refills    oxyCODONE-acetaminophen (PERCOCET) 5-325 mg per tablet 120 tablet 0     Sig: Take 1 tablet by mouth every six (6) hours as needed for pain.       Last refill given on: 10/06/23 with 120 count and 0 refills.     Recent Visits  Date Type Provider Dept   07/22/23 Office Visit Mangel, Benison Pap, DO [place] [facility] [address] [facility]   06/19/23 Office Visit Jenell Milliner, MD [place] [facility] [address] [facility]   02/17/23 Office Visit Jenell Milliner, MD [place] [facility] [address] [facility]   Showing recent visits within past 365 days and meeting all other requirements  Future Appointments  No visits were found meeting these conditions.  Showing future appointments within next 365 days and meeting all other requirements       Opioid Monitoring   Urine Tox Screen Last Drug Screen Date: 02/17/2023  Opiate Confirmation Test Last Drug Screen Date: Not Found  Last Opioid Dispensed Provider: Benison Pap Mangel, DO  Prescribed MEDD : 30  Last PDMP Review: 11/03/2023  9:23 AM  Last Opioid Pain Agreement Signed Date: 03/06/2023  Last Non Opioid Controlled Substance Pain Agreement Signed Date: Not Found    Naloxone Ordered: Not Found  Last OV: 07/22/2023

## 2023-11-04 DIAGNOSIS — M51369 DDD (degenerative disc disease), lumbar: Principal | ICD-10-CM

## 2023-11-04 DIAGNOSIS — G894 Chronic pain syndrome: Principal | ICD-10-CM

## 2023-11-04 NOTE — Unmapped (Signed)
Pt requesting his prescription be resent with the fill date of 11/13 since that is 30 days from 10/14

## 2023-11-04 NOTE — Unmapped (Signed)
Copied from CRM [id number]. Topic: Access To Clinicians - Req Clinic Call Back  >> Nov 04, 2023 10:43 AM Cleon Dew wrote:  The patient is requesting for the Provider to contact them in regards to medication (perocet) states that he is suppose to get them every 30 days and the 14th is going to be over that  Please contact The patient by Cell Phone      Routine callback turnaround time: 24-48 business hours. Programmer, systems Notified)

## 2023-11-04 NOTE — Unmapped (Signed)
Addended by: Karie Georges PAP on: 11/04/2023 03:47 PM     Modules accepted: Orders

## 2023-11-04 NOTE — Unmapped (Signed)
Fixed order, thank you

## 2023-11-05 ENCOUNTER — Ambulatory Visit: Admit: 2023-11-05 | Discharge: 2023-11-06 | Payer: MEDICARE | Attending: Family | Primary: Family

## 2023-11-05 DIAGNOSIS — Z23 Encounter for immunization: Principal | ICD-10-CM

## 2023-11-05 DIAGNOSIS — Z79899 Other long term (current) drug therapy: Principal | ICD-10-CM

## 2023-11-05 LAB — CREATININE
CREATININE: 1.2 mg/dL — ABNORMAL HIGH
EGFR CKD-EPI (2021) MALE: 59 mL/min/{1.73_m2} — ABNORMAL LOW (ref >=60)

## 2023-11-05 LAB — CBC W/ AUTO DIFF
BASOPHILS ABSOLUTE COUNT: 0.1 10*9/L (ref 0.0–0.1)
BASOPHILS RELATIVE PERCENT: 1 %
EOSINOPHILS ABSOLUTE COUNT: 0.3 10*9/L (ref 0.0–0.5)
EOSINOPHILS RELATIVE PERCENT: 3.9 %
HEMATOCRIT: 34 % — ABNORMAL LOW (ref 39.0–48.0)
HEMOGLOBIN: 11.7 g/dL — ABNORMAL LOW (ref 12.9–16.5)
LYMPHOCYTES ABSOLUTE COUNT: 1.6 10*9/L (ref 1.1–3.6)
LYMPHOCYTES RELATIVE PERCENT: 21.1 %
MEAN CORPUSCULAR HEMOGLOBIN CONC: 34.4 g/dL (ref 32.0–36.0)
MEAN CORPUSCULAR HEMOGLOBIN: 33.7 pg — ABNORMAL HIGH (ref 25.9–32.4)
MEAN CORPUSCULAR VOLUME: 97.9 fL — ABNORMAL HIGH (ref 77.6–95.7)
MEAN PLATELET VOLUME: 7.5 fL (ref 6.8–10.7)
MONOCYTES ABSOLUTE COUNT: 0.8 10*9/L (ref 0.3–0.8)
MONOCYTES RELATIVE PERCENT: 10.2 %
NEUTROPHILS ABSOLUTE COUNT: 5 10*9/L (ref 1.8–7.8)
NEUTROPHILS RELATIVE PERCENT: 63.8 %
PLATELET COUNT: 212 10*9/L (ref 150–450)
RED BLOOD CELL COUNT: 3.47 10*12/L — ABNORMAL LOW (ref 4.26–5.60)
RED CELL DISTRIBUTION WIDTH: 13.4 % (ref 12.2–15.2)
WBC ADJUSTED: 7.8 10*9/L (ref 3.6–11.2)

## 2023-11-05 LAB — ALT: ALT (SGPT): 11 U/L (ref 10–49)

## 2023-11-05 LAB — AST: AST (SGOT): 22 U/L (ref <=34)

## 2023-11-05 MED ORDER — OXYCODONE-ACETAMINOPHEN 5 MG-325 MG TABLET
ORAL_TABLET | Freq: Four times a day (QID) | ORAL | 0 refills | 30 days | Status: CP | PRN
Start: 2023-11-05 — End: 2023-12-05

## 2023-11-05 NOTE — Unmapped (Signed)
 Please have eye exam faxed to [place] at [phone].

## 2023-11-06 MED ORDER — OXYCODONE-ACETAMINOPHEN 5 MG-325 MG TABLET
ORAL_TABLET | Freq: Four times a day (QID) | ORAL | 0 refills | 30 days | Status: CP | PRN
Start: 2023-11-06 — End: 2023-12-06

## 2023-11-12 NOTE — Unmapped (Signed)
Received call from [facility] to clarify Actemra dosing and qty.  Per Rheum notes pt is tapering to q4 weeks from q21 days.  Pharmacist will sent out next delivery today, with qty to cover pt for q21d dosing, but note that pt is tapering to q4 weeks.

## 2023-12-03 DIAGNOSIS — G894 Chronic pain syndrome: Principal | ICD-10-CM

## 2023-12-03 DIAGNOSIS — M51369 DDD (degenerative disc disease), lumbar: Principal | ICD-10-CM

## 2023-12-03 MED ORDER — OXYCODONE-ACETAMINOPHEN 5 MG-325 MG TABLET
ORAL_TABLET | Freq: Four times a day (QID) | ORAL | 0 refills | 30 days | Status: CP | PRN
Start: 2023-12-03 — End: 2024-01-02

## 2023-12-03 NOTE — Unmapped (Signed)
Patient is requesting the following refill  Requested Prescriptions     Pending Prescriptions Disp Refills    oxyCODONE-acetaminophen (PERCOCET) 5-325 mg per tablet 120 tablet 0     Sig: Take 1 tablet by mouth every six (6) hours as needed for pain.       Recent Visits  Date Type Provider Dept   07/22/23 Office Visit Mangel, Benison Pap, DO [place] [facility] [address] [facility]   06/19/23 Office Visit Jenell Milliner, MD [place] [facility] [address] [facility]   02/17/23 Office Visit Jenell Milliner, MD [place] [facility] [address] [facility]   Showing recent visits within past 365 days and meeting all other requirements  Future Appointments  Date Type Provider Dept   02/03/24 Appointment Mangel, Benison Pap, DO [place] [facility] [address] [facility]   Showing future appointments within next 365 days and meeting all other requirements       Labs: Not applicable this refill

## 2023-12-03 NOTE — Unmapped (Signed)
Addended by: Azucena Cecil, Orest Dygert on: 12/03/2023 01:46 PM     Modules accepted: Orders

## 2023-12-03 NOTE — Unmapped (Signed)
Copied from CRM #[id number]. Topic: Access To Clinicians - Req Clinic Call Back  >> Dec 03, 2023  9:22 AM Kenard Gower wrote:  The patient is requesting for the Nurse to contact them in regards to prescription that provider needs to call in   Please contact The patient by Cell Phone       Urgent callback turnaround time: within 24 business hours. Programmer, systems Notified)

## 2023-12-31 DIAGNOSIS — G894 Chronic pain syndrome: Principal | ICD-10-CM

## 2023-12-31 DIAGNOSIS — M51369 DDD (degenerative disc disease), lumbar: Principal | ICD-10-CM

## 2023-12-31 NOTE — Unmapped (Signed)
Patient is requesting the following refill  Requested Prescriptions     Pending Prescriptions Disp Refills    oxyCODONE-acetaminophen (PERCOCET) 5-325 mg per tablet 120 tablet 0     Sig: Take 1 tablet by mouth every six (6) hours as needed for pain.       Last refill given on: 12/03/23 with 120 count and 0 refills.     Recent Visits  Date Type Provider Dept   07/22/23 Office Visit Mangel, Benison Pap, DO [place] [facility] [address] [facility]   06/19/23 Office Visit Jenell Milliner, MD [place] [facility] [address] [facility]   02/17/23 Office Visit Jenell Milliner, MD [place] [facility] [address] [facility]   Showing recent visits within past 365 days and meeting all other requirements  Future Appointments  Date Type Provider Dept   02/03/24 Appointment Mangel, Benison Pap, DO [place] [facility] [address] [facility]   Showing future appointments within next 365 days and meeting all other requirements       Opioid Monitoring   Urine Tox Screen Last Drug Screen Date: 02/17/2023  Opiate Confirmation Test Last Drug Screen Date: Not Found  Last Opioid Dispensed Provider: Benison Pap Mangel, DO  Prescribed MEDD : 30  Last PDMP Review: 12/31/2023 10:09 AM  Last Opioid Pain Agreement Signed Date: 03/06/2023  Last Non Opioid Controlled Substance Pain Agreement Signed Date: Not Found    Naloxone Ordered: Not Found  Last OV: 07/22/2023

## 2023-12-31 NOTE — Unmapped (Signed)
Copied from CRM #[id number]. Topic: Access To Clinicians - Medication Refill  >> Dec 31, 2023  9:53 AM Rulon Abide wrote:  The caller reports that they have previously requested refill from pharmacy, but have not received authorization yet.     The patient is requesting the following:     Medication(s) for refill: oxyCODONE-acetaminophen (PERCOCET) 5-325 mg per tablet   Quantity for 1 month supply    *No tablets left     [id number] [place], [place] - [address]      Please contact The patient by Cell Phone in regards to this request.    Coverage has been verified and updated if needed: yes    Urgent callback turnaround time: within 24 business hours. Programmer, systems Notified)

## 2024-01-02 MED ORDER — OXYCODONE-ACETAMINOPHEN 5 MG-325 MG TABLET
ORAL_TABLET | Freq: Four times a day (QID) | ORAL | 0 refills | 30 days | Status: CP | PRN
Start: 2024-01-02 — End: 2024-02-01

## 2024-01-28 NOTE — Unmapped (Signed)
[name]  SUCCESSFUL ENGAGEMENT    Erik [name] is a 89 y.o. male identified as possibly having a proportion of days covered of less than 80%  for quality measure(s), based on their performance during the previous measurement year. Contacted the Patient to review medication use history for the medication(s) described below.    Population:  [employer]    Clinical Medication Adherence Assessment      Medication Assessed #1: LOSARTAN 100MG    Chronic Disease State: Hypertension   Source of Data/Request for Medication Adherence Assessment: Payer Report   Does patient indicate they are taking their medication as prescribed?: Yes   Are prescription instructions in line with the way patient is currently taking this medication?: Yes   Medication Refills Remaining?: Yes   Do you currently receive a 90-day supply of this medication?: Yes   In general do you have any trouble obtaining or affording your medications?: No   Does the patient have any perceived side effects from this medication?: No         What other reasons can you think of that may have caused missed doses or gaps in medication fill history?: None   Reasons for Non-Adherence: Other   Patient states that he takes half a tablet daily but if his blood pressure is high he takes a full tablet . He states that he has an appointment with his new provider Tuesday and will discuss it with her .    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[facility])   Contact Source: Patient   Who helps you with your medications?: Manage Myself   What reminder methods or adherence aids do you currently use to take your medications?: Keeping medication in visible location   Thinking about all of your medications, do you have any issues or concerns that we can further assist you with?: No      Would you be interested in discussing your medication concerns further through a scheduled virtual visit with a pharmacist?: No   Interventions Taken: None            [name] , CPhT  Certified Pharmacy Technician  [place] Assessment of Medications Program (CAMP)  CAMP Clinic Phone Numbers: [phone]

## 2024-01-30 DIAGNOSIS — G894 Chronic pain syndrome: Principal | ICD-10-CM

## 2024-01-30 DIAGNOSIS — M51369 DDD (degenerative disc disease), lumbar: Principal | ICD-10-CM

## 2024-01-30 MED ORDER — OXYCODONE-ACETAMINOPHEN 5 MG-325 MG TABLET
ORAL_TABLET | Freq: Four times a day (QID) | ORAL | 0 refills | 30 days | Status: CP | PRN
Start: 2024-01-30 — End: 2024-02-29

## 2024-01-30 NOTE — Unmapped (Signed)
Copied from CRM #[id number]. Topic: Access To Clinicians - Medication Refill  >> Jan 30, 2024  9:47 AM Ardis Hughs wrote:  Patient calling needs refill on oxyCODONE-acetaminophen (PERCOCET) 5-325 mg per tablet    [place] ref[place] on [place] call to let him know when it is picked up     [id number] [place] - [address]  [address] [place] [address]  Phone: [phone] Fax: [phone]

## 2024-01-30 NOTE — Unmapped (Signed)
Patient is requesting the following refill  Requested Prescriptions     Pending Prescriptions Disp Refills    oxyCODONE-acetaminophen (PERCOCET) 5-325 mg per tablet 120 tablet 0     Sig: Take 1 tablet by mouth every six (6) hours as needed for pain.       Last refill given on: 12/31/23 with 120 count and 9 refills.     Recent Visits  Date Type Provider Dept   07/22/23 Office Visit Mangel, Benison Pap, DO [place] [facility] [address] [facility]   06/19/23 Office Visit Jenell Milliner, MD [place] [facility] [address] [facility]   02/17/23 Office Visit Jenell Milliner, MD [place] [facility] [address] At [facility]   Showing recent visits within past 365 days and meeting all other requirements  Future Appointments  Date Type Provider Dept   02/03/24 Appointment Mangel, Benison Pap, DO [place] [facility] [address] [facility]   Showing future appointments within next 365 days and meeting all other requirements       Opioid Monitoring   Urine Tox Screen Last Drug Screen Date: 02/17/2023  Opiate Confirmation Test Last Drug Screen Date: Not Found  Last Opioid Dispensed Provider: Benison Pap Mangel, DO  Prescribed MEDD : 30  Last PDMP Review: 01/30/2024  9:57 AM  Last Opioid Pain Agreement Signed Date: 03/06/2023  Last Non Opioid Controlled Substance Pain Agreement Signed Date: Not Found    Naloxone Ordered: Not Found  Last OV: 07/22/2023

## 2024-02-03 ENCOUNTER — Ambulatory Visit
Admit: 2024-02-03 | Discharge: 2024-02-04 | Payer: MEDICARE | Attending: Student in an Organized Health Care Education/Training Program | Primary: Student in an Organized Health Care Education/Training Program

## 2024-02-03 DIAGNOSIS — I25810 Atherosclerosis of coronary artery bypass graft(s) without angina pectoris: Principal | ICD-10-CM

## 2024-02-03 DIAGNOSIS — M316 Other giant cell arteritis: Principal | ICD-10-CM

## 2024-02-03 DIAGNOSIS — G8929 Other chronic pain: Principal | ICD-10-CM

## 2024-02-03 DIAGNOSIS — J449 Chronic obstructive pulmonary disease, unspecified: Principal | ICD-10-CM

## 2024-02-03 DIAGNOSIS — Z9181 History of falling: Principal | ICD-10-CM

## 2024-02-03 DIAGNOSIS — M5440 Lumbago with sciatica, unspecified side: Principal | ICD-10-CM

## 2024-02-03 DIAGNOSIS — I10 Essential (primary) hypertension: Principal | ICD-10-CM

## 2024-02-03 DIAGNOSIS — N400 Enlarged prostate without lower urinary tract symptoms: Principal | ICD-10-CM

## 2024-02-03 DIAGNOSIS — K5900 Constipation, unspecified: Principal | ICD-10-CM

## 2024-02-03 MED ORDER — IPRATROPIUM 0.5 MG-ALBUTEROL 3 MG (2.5 MG BASE)/3 ML NEBULIZATION SOLN
Freq: Two times a day (BID) | RESPIRATORY_TRACT | 11 refills | 1.00 days | Status: CP
Start: 2024-02-03 — End: 2024-02-03

## 2024-02-03 MED ORDER — BUDESONIDE 0.5 MG/2 ML SUSPENSION FOR NEBULIZATION
Freq: Two times a day (BID) | RESPIRATORY_TRACT | 11 refills | 30.00 days | Status: CP
Start: 2024-02-03 — End: 2025-02-02

## 2024-02-03 NOTE — Unmapped (Signed)
Assessment and Plan:   Chronic obstructive pulmonary disease, unspecified COPD type (CMS-HCC)  -     Discontinue: ipratropium-albuteroL; Inhale 3 mL by nebulization two (2) times a day.  -     Discontinue: budesonide; Inhale 2 mL (0.5 mg total) by nebulization two (2) times a day.  -     budesonide; Inhale 2 mL (0.5 mg total) by nebulization two (2) times a day.  -     ipratropium-albuteroL; Inhale 3 mL by nebulization two (2) times a day.    Coronary artery disease involving autologous vein coronary bypass graft without angina pectoris    Giant cell arteritis (CMS-HCC)    Essential hypertension, benign    Risk for falls    Constipation, unspecified constipation type    Chronic bilateral low back pain with sciatica, sciatica laterality unspecified    Benign prostatic hyperplasia, unspecified whether lower urinary tract symptoms present    ----  89 yo male presents today to follow up    Giant cell arteritis (CMS-HCC)/Ischemic Optic Neuropathy, Exudative age-related macular degeneration of right eye, unspecified stage (CMS-HCC), Left eye vision loss, Long-term current use of tocilizumab  -Following closely with Rheumatology with last visit 10/26/2023:   89 y.o. smoker with h/o COPD, CAD s/p CABG,  HTN, active alcohol use, GIB 2/2 duodenal ulcer (likely 2/2 concurrent ibuprofen, alcohol and prednisone use) presenting for follow-up of  biopsy proven giant cell arteritis  complicated by ischemic optic neuropathy/ vision loss in left eye (dx 11/2017)  He was last seen 05/01/2023 with Dr. Ilsa Iha.  Currently taking tocilizumab 162 mg SQ q 21 days. Today reporting overall stable symptoms. No GCA/PMR symptoms.   - Taper tocilizumab 162 mg subcutaneous to q4weeks   - Update monitoring labs: CBCw/diff, Cr, AST, ALT  - Of note, patient with known h/o GIB/duodenal ulcer which does increase risk of GI perforation on tocilizumab - however, he is now >18 months out from this without any complications and wishes to continue treatment with tocilizumab  - Reviewed signs and sx of PMR and GCA with patient, advised to reach out to clinic should symptoms occur with taper of toclilizumab  - Continue F/U with Ophthalmology. Reports upcoming visit 11/2023.  -Rheumatology visit on 04/29/2024    Coronary artery disease involving autologous vein coronary bypass graft without angina pectoris, Essential hypertension, benign, Dyslipidemia  BP Readings from Last 3 Encounters:   02/03/24 156/49   11/05/23 144/55   10/31/23 130/58   -ACEi/ARB/ARNI: Losartan 50mg  (taking 1/2 tablet)   -BB: Atenolol 50mg  BID  -CCB: Amlodipine 10mg  every day   -Diuretic: Furosemide 20mg  PRN  -Additional Anti-hypertensive: Hydralazine 25mg  every day   -Lipid-lowering agents: Simvastatin 10mg  every day   -Anti-platelet therapy: Aspirin 81mg  every day   -His BP at home has continued to remain well within normal range. We will continue current regimen at this time      COPD exacerbation (CMS-HCC)  -Former smoker. He has not had any formal PFT. He has previously declined pulmonology referral/evaluation.   -He was given Duoneb/Albuterol nebulizer and Pulmicort. These therapies have greatly helped his breathing. Refills provided to patient   -refills provided on Pulmicort and Duoneb    Benign prostatic hyperplasia, unspecified whether lower urinary tract symptoms present  -Chronic but appears stable   -Continue Tamsulosin 0.4mg  every day     Constipation, Ulcer?  -He reports chronic history of constipation. However not a concern at this time  -He was evaluated via Telehealth on 10/31/2023 for concern regarding  an ulcer. He was prescribed Prilosec but this did not provide much benefit.   -Will continue to monitor    DDD (degenerative disc disease), lumbar  -Chronic in nature. Currently taking: Percocet 5mg  q6hrs PRN   -Pain Contract signed:02/17/2023, UDS: 02/17/2023  -He is ambulating with a cane at this time to decrease his risk of falls    Macrocytic anemia  -former heavy drinker. However he does continue to drink 1 beer a day.       HEALTH MAINTENANCE  -CT Abdomen/pelvis w/ IV contrast from 11/2017 noted that he had osteopenia. DEXA scan from 2019 did reveal this as well. He has declined DEXA scan at this time.       To follow up in 4 months for Medicare AWV.   -Will need to obtain labs next visit as well as renew UDS and annual drug contract.     I personally spent 30 minutes face-to-face and non-face-to-face in the care of this patient, which includes all pre, intra, and post visit time on the date of service.  All documented time was specific to the E/M visit and does not include any procedures that may have been performed.        Subjective:     Erik [name] 89 y.o.male  is being seen for follow up     BP  -His BP at home has remained <140/<60   -he notes that his BP this morning was 120/57    Constipation/Ulcer??   -he does use suppositories sometimes   -He notes o concern there regarding an ulcer     COPD  -he notes that his breathing is okay  -he used the nebulizer this morning   -he makes sure to clean out his nebulizer     BACK PAIN, knee pain   -He has now had knee pain (right side)  -The percocet has helped his pain as well as heat bad   -He does do weight exercises with his arms (3 lbs) and legs (2lbs) +    He does drink 1 beer a day    He has abirthday coming up.     ROS:     Review of systems negative unless otherwise noted as per HPI        Vital Signs:     Wt Readings from Last 3 Encounters:   02/03/24 60.3 kg (133 lb)   11/05/23 62.8 kg (138 lb 6.4 oz)   10/31/23 61.2 kg (135 lb)     Temp Readings from Last 3 Encounters:   11/05/23 36.2 ??C (97.2 ??F) (Temporal)   07/22/23 36.1 ??C (97 ??F) (Temporal)   06/19/23 36.9 ??C (98.4 ??F)     BP Readings from Last 3 Encounters:   02/03/24 156/49   11/05/23 144/55   10/31/23 130/58     Pulse Readings from Last 3 Encounters:   02/03/24 52   11/05/23 64   07/22/23 70       Objective:     General: normal appearance, not toxic or ill appearing EYES: Anicteric sclerae. Wearing glasses  ENT: Oropharynx moist.  RESP: Relaxed respiratory effort. Somewhat decreased air movement but no cough, wheeze, rhonchi, rales   CV: Regular rhythm, bradycardic.  MSK: Arthritic changes noted to hands and knees. Gait is stable WITH using a cane  SKIN: Appropriately warm and moist.  NEURO: Stable gait and coordination.

## 2024-02-05 DIAGNOSIS — G894 Chronic pain syndrome: Principal | ICD-10-CM

## 2024-02-05 DIAGNOSIS — M51369 DDD (degenerative disc disease), lumbar: Principal | ICD-10-CM

## 2024-02-05 MED ORDER — OXYCODONE-ACETAMINOPHEN 5 MG-325 MG TABLET
ORAL_TABLET | Freq: Four times a day (QID) | ORAL | 0 refills | 30 days | Status: CP | PRN
Start: 2024-02-05 — End: 2024-03-06

## 2024-02-05 NOTE — Unmapped (Signed)
Addended by: Barbaraann Boys on: 02/05/2024 10:26 AM     Modules accepted: Orders

## 2024-02-05 NOTE — Unmapped (Signed)
Addended by: Karie Georges PAP on: 02/05/2024 10:27 AM     Modules accepted: Orders

## 2024-02-05 NOTE — Unmapped (Signed)
Copied from CRM #[id number]. Topic: Access To Clinicians - Req Clinic Call Back  >> Feb 05, 2024 10:09 AM Durel Salts wrote:  The patient called requesting: a call back to discuss medication going to wrong pharmacy        Please call to assist

## 2024-02-05 NOTE — Unmapped (Signed)
Refill went to wrong pharmacy, needs to be resent to [facility]

## 2024-02-06 NOTE — Unmapped (Signed)
 This was corrected. [facility] Informed [facility]

## 2024-02-18 DIAGNOSIS — J449 Chronic obstructive pulmonary disease, unspecified: Principal | ICD-10-CM

## 2024-02-18 MED ORDER — ALBUTEROL SULFATE HFA 90 MCG/ACTUATION AEROSOL INHALER
Freq: Four times a day (QID) | RESPIRATORY_TRACT | 3 refills | 0.00 days | Status: CP | PRN
Start: 2024-02-18 — End: 2025-02-17

## 2024-02-25 DIAGNOSIS — G894 Chronic pain syndrome: Principal | ICD-10-CM

## 2024-02-25 DIAGNOSIS — M51369 DDD (degenerative disc disease), lumbar: Principal | ICD-10-CM

## 2024-02-25 NOTE — Unmapped (Signed)
 Copied from CRM #[id number]. Topic: Access To Clinicians - Medication Refill  >> Feb 25, 2024  9:01 AM Andee Poles wrote:  The caller has not previously requested the refill from their pharmacy. Pt states his refill is due by Friday 02/27/24 and he would like this medication to always go to a different Pharmacy because he can get it cheaper.    The patient is requesting the following:     Medication(s) for refill:     1. oxyCODONE-acetaminophen (PERCOCET) 5-325 mg per tablet [[id number]]      Quantity for 1 month supply    Pharmacy name and address: Please change this [facility] only [facility] [address] [place] [address]  Ph: [phone]    Please contact The patient by Cell Phone in regards to this request.  Coverage: yes, coverage is accurate on file.    Medication request callback turnaround time: 72 business hours. Programmer, systems Notified)

## 2024-02-25 NOTE — Unmapped (Signed)
 Addended by: Bobby Rumpf on: 02/25/2024 09:28 AM     Modules accepted: Orders

## 2024-02-25 NOTE — Unmapped (Signed)
 PT is upset after rx that was sent in for oxyCODONE-acetaminophen (PERCOCET) 5-325 mg per tablet [[id number]] can't be filled until 03/15, pt stated his last fill was only for 30 days and the new fill date is to long and he will die due to the excessive pain he is in. Pt is looking for someone to please call him asap. . The patient Cell Phone Urgent callback turnaround time: within 24 business hours. Programmer, systems Notified)

## 2024-02-25 NOTE — Unmapped (Signed)
 Called pt states his last refill was 01/30/24 and the prescription sent on 2/13 was d/c'ed because it was sent to wrong pharmacy. [facility] on [facility]/24 which will make it due 3/9 instead of 3/15. I have t'ed it up for 3/9. I do not have access to the registry to make sure he did not pick up the 2/13 script.

## 2024-02-25 NOTE — Unmapped (Signed)
 Patient is requesting the following refill  Requested Prescriptions     Pending Prescriptions Disp Refills    oxyCODONE-acetaminophen (PERCOCET) 5-325 mg per tablet 120 tablet 0     Sig: Take 1 tablet by mouth every six (6) hours as needed for pain.     Signed Prescriptions Disp Refills    oxyCODONE-acetaminophen (PERCOCET) 5-325 mg per tablet 120 tablet 0     Sig: Take 1 tablet by mouth every six (6) hours as needed for pain.     Authorizing Provider: Karie Georges PAP       Recent Visits  Date Type Provider Dept   02/03/24 Office Visit Mangel, Benison Pap, DO [place] [facility] [address] [facility]   07/22/23 Office Visit Mangel, Benison Pap, DO [place] [facility] [address] [facility]   06/19/23 Office Visit Jenell Milliner, MD [place] [facility] [address] [facility]   Showing recent visits within past 365 days and meeting all other requirements  Future Appointments  Date Type Provider Dept   06/08/24 Appointment Mangel, Benison Pap, DO [place] [facility] [address] [facility]   Showing future appointments within next 365 days and meeting all other requirements       Labs: Not applicable this refill

## 2024-02-26 NOTE — Unmapped (Signed)
 Patient is upset and requesting a call back today.

## 2024-02-29 MED ORDER — OXYCODONE-ACETAMINOPHEN 5 MG-325 MG TABLET
ORAL_TABLET | Freq: Four times a day (QID) | ORAL | 0 refills | 30 days | Status: CP | PRN
Start: 2024-02-29 — End: 2024-03-30

## 2024-03-06 MED ORDER — OXYCODONE-ACETAMINOPHEN 5 MG-325 MG TABLET
ORAL_TABLET | Freq: Four times a day (QID) | ORAL | 0 refills | 30 days | Status: CP | PRN
Start: 2024-03-06 — End: 2024-02-25

## 2024-03-26 DIAGNOSIS — G894 Chronic pain syndrome: Principal | ICD-10-CM

## 2024-03-26 DIAGNOSIS — M51369 DDD (degenerative disc disease), lumbar: Principal | ICD-10-CM

## 2024-03-26 NOTE — Unmapped (Signed)
 Copied from CRM [id number]. Topic: Access To Clinicians - Medication Refill  >> Mar 26, 2024 11:02 AM Gardiner Ramus wrote:  The caller reports that they have previously requested refill from pharmacy, but have not received authorization yet.   Patient would like a call when order is sent. He knows it is early but wanted to put the request in before the weekend.     The patient is requesting the following:     Medication(s) for refill:   oxyCODONE-acetaminophen (PERCOCET) 5-325 mg per tablet      [id number] [place] [place] - [address]  [address] [place], [place] [address]  Phone: [phone]  Fax: [phone]      Please contact The patient by Cell Phone in regards to this request.    Coverage: yes, coverage is accurate on file.    Medication request callback turnaround time: 72 business hours. Programmer, systems Notified)

## 2024-03-26 NOTE — Unmapped (Signed)
 Patient is requesting the following refill  Requested Prescriptions     Pending Prescriptions Disp Refills    oxyCODONE-acetaminophen (PERCOCET) 5-325 mg per tablet 120 tablet 0     Sig: Take 1 tablet by mouth every six (6) hours as needed for pain.       Recent Visits  Date Type Provider Dept   02/03/24 Office Visit Mangel, Benison Pap, DO [place] [facility] [address] At [facility]   07/22/23 Office Visit Mangel, Benison Pap, DO [place] [facility] [address] At [facility]   06/19/23 Office Visit Jenell Milliner, MD [place] [facility] [address] At [facility]   Showing recent visits within past 365 days and meeting all other requirements  Future Appointments  Date Type Provider Dept   06/08/24 Appointment Mangel, Benison Pap, DO [place] [facility] [address] [facility]   Showing future appointments within next 365 days and meeting all other requirements

## 2024-03-26 NOTE — Unmapped (Signed)
 Addended by: Bobby Rumpf on: 03/26/2024 11:15 AM     Modules accepted: Orders

## 2024-03-27 MED ORDER — OXYCODONE-ACETAMINOPHEN 5 MG-325 MG TABLET
ORAL_TABLET | Freq: Four times a day (QID) | ORAL | 0 refills | 30 days | Status: CP | PRN
Start: 2024-03-27 — End: 2024-04-26

## 2024-04-22 DIAGNOSIS — M51369 DDD (degenerative disc disease), lumbar: Principal | ICD-10-CM

## 2024-04-22 DIAGNOSIS — G894 Chronic pain syndrome: Principal | ICD-10-CM

## 2024-04-22 MED ORDER — OXYCODONE-ACETAMINOPHEN 5 MG-325 MG TABLET
ORAL_TABLET | Freq: Four times a day (QID) | ORAL | 0 refills | 30.00000 days | Status: CN | PRN
Start: 2024-04-22 — End: 2024-05-22

## 2024-04-22 NOTE — Unmapped (Signed)
 Addended by: Marquay Kruse on: 04/22/2024 03:41 PM     Modules accepted: Orders

## 2024-04-22 NOTE — Unmapped (Signed)
 Patient is requesting the following refill  Requested Prescriptions     Pending Prescriptions Disp Refills    oxyCODONE-acetaminophen (PERCOCET) 5-325 mg per tablet 120 tablet 0     Sig: Take 1 tablet by mouth every six (6) hours as needed for pain.       Recent Visits  Date Type Provider Dept   02/03/24 Office Visit Mangel, Benison Pap, DO [place] [facility] [address] At [facility]   07/22/23 Office Visit Mangel, Benison Pap, DO [place] [facility] [address] At [facility]   06/19/23 Office Visit Edgar Goods, MD [place] [facility] [address] At [facility]   Showing recent visits within past 365 days and meeting all other requirements  Future Appointments  Date Type Provider Dept   06/08/24 Appointment Mangel, Benison Pap, DO  Primary Care [address] At [facility]   Showing future appointments within next 365 days and meeting all other requirements       Labs: Not applicable this refill

## 2024-04-22 NOTE — Unmapped (Signed)
 Addended by: Skip Dull PAP on: 04/22/2024 03:46 PM     Modules accepted: Orders

## 2024-04-22 NOTE — Unmapped (Signed)
 Copied from CRM [id number]. Topic: Access To Clinicians - Medication Refill  >> Apr 22, 2024  2:24 PM Alyse July wrote:  The caller reports that they have previously requested refill from pharmacy, but have not received authorization yet. Patient is requesting a call back to know when he can pick it up.     The patient is requesting the following:     Medication(s) for refill:   oxyCODONE-acetaminophen (PERCOCET) 5-325 mg per tablet [[id number]]      Quantity for 120 pills     [id number] [place] - [address]  [address] [place] [address]  Phone: [phone] Fax: [phone]  Hours: Not open 24 hours      Please contact The patient by Cell Phone in regards to this request.    Coverage: yes, coverage is accurate on file.    Medication request callback turnaround time: 72 business hours. Programmer, systems Notified)

## 2024-04-22 NOTE — Unmapped (Signed)
 Patient is requesting the following refill  Requested Prescriptions     Pending Prescriptions Disp Refills    oxyCODONE-acetaminophen (PERCOCET) 5-325 mg per tablet 120 tablet 0     Sig: Take 1 tablet by mouth every six (6) hours as needed for pain.       Last refill given on: 03/26/24 with 120 count and 0 refills.     Recent Visits  Date Type Provider Dept   02/03/24 Office Visit Mangel, Benison Pap, DO [place] [facility] [address] At [facility]   07/22/23 Office Visit Mangel, Benison Pap, DO [place] [facility] [address] At [facility]   06/19/23 Office Visit Edgar Goods, MD [place] [facility] [address] At [facility]   Showing recent visits within past 365 days and meeting all other requirements  Future Appointments  Date Type Provider Dept   06/08/24 Appointment Mangel, Benison Pap, DO [place] [facility] [address] [facility]   Showing future appointments within next 365 days and meeting all other requirements       Opioid Monitoring   Urine Tox Screen Last Drug Screen Date: Not Found  Opiate Confirmation Test Last Drug Screen Date: Not Found  Last Opioid Dispensed Provider: Benison Pap Mangel, DO  Prescribed MEDD : 30  Last PDMP Review: 02/25/2024  9:26 AM  Last Opioid Pain Agreement Signed Date: 03/06/2023  Last Non Opioid Controlled Substance Pain Agreement Signed Date: Not Found    Naloxone Ordered: Not Found  Last OV: 02/03/2024

## 2024-04-25 MED ORDER — OXYCODONE-ACETAMINOPHEN 5 MG-325 MG TABLET
ORAL_TABLET | Freq: Four times a day (QID) | ORAL | 0 refills | 30.00000 days | Status: CP | PRN
Start: 2024-04-25 — End: 2024-05-25

## 2024-05-20 DIAGNOSIS — G894 Chronic pain syndrome: Principal | ICD-10-CM

## 2024-05-20 DIAGNOSIS — M51369 DDD (degenerative disc disease), lumbar: Principal | ICD-10-CM

## 2024-05-20 MED ORDER — OXYCODONE-ACETAMINOPHEN 5 MG-325 MG TABLET
ORAL_TABLET | Freq: Four times a day (QID) | ORAL | 0 refills | 30.00000 days | Status: CP | PRN
Start: 2024-05-20 — End: 2024-06-19

## 2024-05-20 NOTE — Unmapped (Signed)
 Called pt to reschedule awv with frances - he stated it is also time to reschedule his Percocet (06/02) [facility]

## 2024-05-20 NOTE — Unmapped (Signed)
-----   Message from [place], DO sent at 05/19/2024  7:05 PM EDT -----  Regarding: reschedule AWV  Medicare AWV on 6/17 at 8:20 needs to be rescheduled to [facility]

## 2024-05-20 NOTE — Unmapped (Signed)
 No answer/ no voicemail, sent mychart message and scheduling ticket to  res. with Blanca Bunch 06/17 amyers

## 2024-05-20 NOTE — Unmapped (Signed)
 Informed pt med refill was sent in.

## 2024-06-03 NOTE — Unmapped (Addendum)
 Here is your personalized prevention plan based on your Annual Wellness Visit today.    Medicare Screening & Prevention Guidelines Recommendations Dates Completed HM Status and Next Due Follow-Up   AAA Ultrasound Once in men age 88 to 36 who have ever smoked or currently smoke. AAA screening date: Not Found Health Maintenance Summary    This patient has no relevant Health Maintenance data.      Not within age range   Colorectal Cancer Screening Patients 45 to 75: stool cards annually OR colonoscopy every 10 years (or more frequently if high risk) OR FIT-DNA every 3 years.  Colonoscopy date: Not Found  FOBT/FIT date: Not Found  Sigmoidoscopy date: Not Found  FIT-DNA date: Not Found Health Maintenance Summary    This patient has no relevant Health Maintenance data.      Not within age range   DEXA Bone Density Measurement Patients age 36-95 to have a Dexa every 5 years in postmenopausal women and high risk males. Males will defer to PCP. DEXA date: 02/04/2018 Health Maintenance Summary    This patient has no relevant Health Maintenance data.      Patient declined - other pt states not needed   Heart Disease Screening (fasting lipid panel) Minimum of every 5 years, patients age 26-75,  if no apparent signs or symptoms of heart disease. LDL date: 02/17/2023  Total choleseterol date: 02/17/2023  HDL date: 02/17/2023  Triglycerides date: 02/17/2023 Health Maintenance Summary    This patient has no relevant Health Maintenance data.      Up to date   Hepatitis C Screening A one-time screening for HCV infection for adults age 22 to 88 years old. HCV screening date: 11/28/2017 Health Maintenance Summary    This patient has no relevant Health Maintenance data.      Complete   Covid-19 Vaccine For persons 5 and older Dose 1 04/25/2020  Dose 2 05/23/2020  Dose 3   Dose 4          Health Maintenance   Topic Date Due    COVID-19 Vaccine (3 - Moderna risk series) 06/20/2020      Defer to PCP and patient states will let pcp  advice on that    Tdap Every 10 years (will not be covered by Medicare). Check with your pharmacy to see your pharmacy to see if you are eligible for this vaccine based on your insurance. DTap/Tdap/TD vaccination: Not Found Health Maintenance Summary    -      Current Care Gaps     DTaP/Tdap/Td Vaccines (1 - Tdap) Never done   No completion history exists for this topic.                 Provided information on how to obtain at pharmacy   Influenza Vaccine Annually  Influenza Vaccination: 11/05/2023   Health Maintenance Summary    -      Completed or No Longer Recommended     Influenza Vaccine (Series Information) Completed    11/05/2023  Imm Admin: INFLUENZA IIV3 HIGH DOSE 44YRS+(FLUZONE)    10/17/2022  Imm Admin: Influenza Vaccine Quad(IM)6 MO-Adult(PF)    10/17/2022  Imm Admin: Influenza Virus Vaccine, unspecified formulation    09/16/2019  Imm Admin: Influenza Virus Vaccine, unspecified formulation    09/16/2019  Imm Admin: Influenza Vaccine Quad(IM)6 MO-Adult(PF) Only the   first 5 history entries have been loaded, but more history exists.  Up to date   PneumococcalVaccine For people >=65, or with an underlying condition Pneumonia vaccination: 04/06/2015   Health Maintenance Summary    -      Completed or No Longer Recommended     Pneumococcal Vaccine 50+ (Series Information) Completed    04/06/2015  Imm Admin: Pneumococcal Conjugate 13-Valent    12/23/2009  Imm Admin: PNEUMOCOCCAL POLYSACCHARIDE 23-VALENT                   Provided information on how to obtain at pharmacy   RSV Vaccine Current recommendations is to have shared decision making with your PCP, please talk with your provider. RSV Vaccination: Not Found Health Maintenance Summary    This patient has no relevant Health Maintenance data.    Provided information on how to obtain at pharmacy   Zoster Vaccine Healthy adults 50 years and older receive 2 doses of recombinant zoster vaccine two to six months apart (may not be covered by Medicare). Check with your pharmacy to see if you are eligible for this vaccine based on your insurance. Zoster vaccination: Not Found Health Maintenance Summary    -      Current Care Gaps     Zoster Vaccines (1 of 2) Never done    No completion history exists for this topic.                 Patient declined - other pt states not needed   Diabetes Screening  Annually for patients 40-70, if risk factors (family hx of DM, hx of gestational DM, and/or PCOS), twice per year if diagnosed with pre-diabetes.    Range 65-99 Diabetes screening date: Not Found N/A Not applicable    Thank you for completing your Medicare Annual Wellness Visit today. If you have any questions about your Medicare Annual Wellness Visit please call our Care Manager, Jenniffer Coria RN CM at ( [phone]).  Please see your updated Personalized Prevention Plan which details the status of your preventative services, agreed upon goals to improve your fitness and health, and educational materials on health topics specific to you

## 2024-06-08 DIAGNOSIS — Z0000 Encounter for general adult medical examination without abnormal findings: Principal | ICD-10-CM

## 2024-06-08 NOTE — Unmapped (Signed)
 ADVANCE CARE PLANNING NOTE    Discussion Date:  June 08, 2024    Patient has decisional capacity:  Yes    Patient has selected a Health Care Decision-Maker if loses capacity: Yes    Health Care Decision Maker as of 06/08/2024    HCDM (patient stated preference): Syler, Norcia - [phone]    Discussion Participants:Patient and care manager     Communication of Medical Status/Prognosis: Patient states current health is good  patient follows up with PCP and specialists regularly       Communication of Treatment Goals/Options: Patient has a Living Will and Healthcare Power of [place], will consider bringing documents for scanning at next follow up visit.     Treatment Decisions: 06/08/2024    The patient reports they have a Healthcare power of attorney Living Will but have not brought a copy to the office.  Have discussed the importance of having this included in the medical record. .      Case Manager facilitated discussion with patient and  about advance care planning and documentation including Healthcare power of attorney Living Will . The patient voluntarily agreed to bring to office Healthcare power of attorney Living Will.   Note: forms that require notarization require two witnessess that are non-family members nor health care workers.            I spent 17 minutes providing voluntary advance care planning services for this patient.

## 2024-06-08 NOTE — Unmapped (Signed)
 Inform/Regulatory Low priority, no response needed unless change in care.     I am reaching out to Erik [name] as part of the embedded care management team regarding his Annual Medicare Wellness visit.  Patient is at risk for Dental issues, Opioid Use, Pain, and Physical Activity Limitations.    Patient advised to Review AVS for education related to risk..  Patient is at risk for Dental issues, Opioid Use, Pain, and Physical Activity Limitations.    Next appt with PCP: None      See chart for med list if needed    Sharing communication as part of regulatory requirements.        This auto-generated note displays some results identified during the AWV Assessments. For full results, please see the Flowsheet Links under the Additional Documentation section of this encounter in Chart Review.        The patient reports they are physically located in [place]  and is currently: at home. I conducted a phone visit.  I spent 40 minutes on the phone call with the patient on the date of service .       Location of Patient:[place]      Patient was unable to report the following vital signs today due to visit being performed by telehealth and patient lack of required equipment to obtain: Pulse and Temperature. Please see flowsheets for any vital signs that were reported this visit.      Patient reported Vital Signs:Blood pressure,Weight, Height       Social Determinants of Health:  Social Determinants of Health Screened today.  Interventions Provided: I provided an intervention for the Physical Activity and Social Connections SDOH domain. The intervention was Education      The following list of current providers and suppliers reviewed and updated this visit.  Patient Care Team:  Mangel, Benison Pap, DO as PCP - General (Family Medicine)  Mangel, Benison Pap, DO as PCP - General-ATTRIBUTED  [email or web address] (Ophthalmology)    Medications and supplements were reviewed and updated this visit. See medication list in encounter summary.     A personalized prevent plan was updated and reviewed with the patient. A copy has been provided to the patient in Patient Instructions.    Recent Hospitalizations reviewed:  No recent hospitalizations     General Health:  Patient answered (!) No (Patient report not seeing  for a long time for no reason) to having visited the dentist in the past year.         Patient's BMI is 24.51   Pain identified during today's visit        06/08/24 9075   PainSc: 2     Chronic Pain Information Provided in AVS    Patient has been identified on as currently using an opioid medication.   Provide patient education, Chronic Pain, Provide patient education,  Opioids: Safe Use, and Provide patient education, Long-Acting: Safe Use: General Info     Safety:       Psychosocial Assessment:       PHQ-2 Score: 0  PHQ-9 Score: 0    PHQ score less than 5 indicating none to minimal depression.  Supervising physician or APP notified via routed chart.  No further action needed.     Screening complete, no depression identified / no further action needed today      Social Drivers of Health     Food Insecurity: No Food Insecurity (06/08/2024)    Hunger Vital Sign  Worried About Programme researcher, broadcasting/film/video in the Last Year: Never true     Ran Out of Food in the Last Year: Never true   Tobacco Use: High Risk (06/08/2024)    Patient History     Smoking Tobacco Use: Some Days     Smokeless Tobacco Use: Never     Passive Exposure: Not on file   Transportation Needs: No Transportation Needs (06/08/2024)    PRAPARE - Transportation     Lack of Transportation (Medical): No     Lack of Transportation (Non-Medical): No   Alcohol Use: Not At Risk (06/08/2024)    Alcohol Use     How often do you have a drink containing alcohol?: Never     How many drinks containing alcohol do you have on a typical day when you are drinking?: 1 - 2     How often do you have 5 or more drinks on one occasion?: Never   Housing: Low Risk  (06/08/2024) Housing     Within the past 12 months, have you ever stayed: outside, in a car, in a tent, in an overnight shelter, or temporarily in someone else's home (i.e. couch-surfing)?: No     Are you worried about losing your housing?: No   Physical Activity: Inactive (06/08/2024)    Exercise Vital Sign     Days of Exercise per Week: 0 days     Minutes of Exercise per Session: 0 min   Utilities: Low Risk  (06/08/2024)    Utilities     Within the past 12 months, have you been unable to get utilities (heat, electricity) when it was really needed?: No   Stress: No Stress Concern Present (06/08/2024)    [employer] of Occupational Health - Occupational Stress Questionnaire     Feeling of Stress: Not at all   Interpersonal Safety: Not At Risk (06/08/2024)    Interpersonal Safety     Unsafe Where You Currently Live: No     Physically Hurt by Anyone: No     Abused by Anyone: No   Substance Use: Low Risk  (06/19/2023)    Substance Use     In the past year, how often have you used prescription drugs for non-medical reasons?: Never     In the past year, how often have you used illegal drugs?: Never     In the past year, have you used any substance for non-medical reasons?: No   Intimate Partner Violence: Not At Risk (06/08/2024)    Humiliation, Afraid, Rape, and Kick questionnaire     Fear of Current or Ex-Partner: No     Emotionally Abused: No     Physically Abused: No     Sexually Abused: No   Social Connections: Socially Isolated (06/08/2024)    Social Connection and Isolation Panel     Frequency of Communication with Friends and Family: More than three times a week     Frequency of Social Gatherings with Friends and Family: Twice a week     Attends Religious Services: Never     Database administrator or Organizations: No     Attends Banker Meetings: Never     Marital Status: Widowed   Physicist, medical Strain: Low Risk  (06/08/2024)    Overall Financial Resource Strain (CARDIA)     Difficulty of Paying Living Expenses: Not hard at all   Health Literacy: Low Risk  (06/08/2024)    Health Literacy     :  Never   Internet Connectivity: Internet connectivity concern identified (06/08/2024)    Internet Connectivity     Do you have access to internet services: No     How do you connect to the internet: Not on file     Is your internet connection strong enough for you to watch video on your device without major problems?: Not on file     Do you have enough data to get through the month?: Not on file     Does at least one of the devices have a camera that you can use for video chat?: Not on file

## 2024-06-08 NOTE — Unmapped (Unsigned)
 [facility] return visit    ASSESSMENT/PLAN:  89 y.o. 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 Today reporting overall stable symptoms. No GCA/PMR symptoms.   - Taper tocilizumab 162 mg subcutaneous to q4weeks   - Update monitoring labs: CBCw/diff, Cr, AST, ALT  - Of note, patient with known h/o GIB/duodenal ulcer which does increase risk of GI perforation on tocilizumab - however, he is now >18 months out from this without any complications and wishes to continue treatment with tocilizumab  - Reviewed signs and sx of PMR and GCA with patient, advised to reach out to clinic should symptoms occur with taper of toclilizumab  - Continue F/U with Ophthalmology. Reports upcoming visit 11/2023.    F/U as scheduled in November with Dr. Juli    I personally spent *** minutes face-to-face and non-face-to-face in the care of this patient, which includes all pre, intra, and post visit time on the date of service.  All documented time was specific to the E/M visit and does not include any procedures that may have been performed.      ________________________________________________________      REASON FOR VISIT: F/U GCA    HISTORY: Erik [name] is a 89 y.o. 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      Last seen 11/05/23 with myself    He is taking Actemra 162 mg SQ q 21days.     Interim history:    Actemra q 4 wk?    PMR/GCA symptoms?    Ophthalmology?    Fever?  Illness?  Infections?  Wt loss?      Record Review: Available records were reviewed, including pertinent office visits, labs, and imaging.      REVIEW OF SYSTEMS: Ten system were reviewed and negative except as noted above.    Past Medical History:   Diagnosis Date    Cataract 2005    cataract surgery    COPD (chronic obstructive pulmonary disease)   10/07/2016    DJD (degenerative joint disease), lumbar     Hypertension     Macular degeneration of right eye 10/17/2022    Mild anemia 01/15/2022    Tobacco abuse 02/15/2014       Current Outpatient Medications   Medication Sig Dispense Refill    albuterol HFA 90 mcg/actuation inhaler Inhale 2 puffs every six (6) hours as needed for wheezing. 24 g 3    amlodipine (NORVASC) 10 MG tablet Take 1 tablet (10 mg total) by mouth daily. 90 tablet 3    ascorbic acid (VITAMIN C) 500 MG tablet Take 1 tablet (500 mg total) by mouth daily.      aspirin (ECOTRIN) 81 MG tablet Take 1 tablet (81 mg total) by mouth daily.      atenolol (TENORMIN) 50 MG tablet Take 1 tablet (50 mg total) by mouth two (2) times a day. 180 tablet  3    budesonide (PULMICORT) 0.5 mg/2 mL nebulizer solution Inhale 2 mL (0.5 mg total) by nebulization two (2) times a day. 120 mL 11    calcium carbonate (OS-CAL) 1,500 mg (600 mg elem calcium) tablet Take 1 tablet (600 mg elem calcium total) by mouth two (2) times a day.      calcium-vitamin D 500 mg-5 mcg (200 unit) per tablet Take 1 tablet by mouth daily.      cyanocobalamin, vitamin B-12, 1000 MCG tablet Take 1 tablet (1,000 mcg total) by mouth daily.      furosemide (LASIX) 20 MG tablet Take 0.5-1 tablets (10-20 mg total) by mouth daily as needed for swelling. 90 tablet 1    hydrALAZINE (APRESOLINE) 25 MG tablet Take 1 tablet (25 mg total) by mouth daily. 90 tablet 3    ipratropium-albuterol (DUO-NEB) 0.5-2.5 mg/3 mL nebulizer Inhale 3 mL by nebulization two (2) times a day. 3 mL 11    losartan (COZAAR) 100 MG tablet Take 1 tablet (100 mg total) by mouth daily. 1/2 tab daily 45 tablet 3    mv-mins/folic/lycopene/ginkgo (ONE-A-DAY MEN'S 50+ ADVANTAGE ORAL) Take by mouth.      oxyCODONE-acetaminophen (PERCOCET) 5-325 mg per tablet Take 1 tablet by mouth every six (6) hours as needed for pain. 120 tablet 0    simvastatin (ZOCOR) 10 MG tablet Take 1 tablet (10 mg total) by mouth daily. 90 tablet 3    tamsulosin (FLOMAX) 0.4 mg capsule Take 1 capsule (0.4 mg total) by mouth daily. 90 capsule 3    tocilizumab (ACTEMRA) 162 mg/0.9 mL Syrg subcutaneous injection Inject 0.9 mL (162 mg total) under the skin every twenty-one (21) days. 5.4 mL 3     No current facility-administered medications for this visit.       Allergies:  No Known Allergies    Social History     Tobacco Use    Smoking status: Some Days     Current packs/day: 0.00     Types: Cigarettes    Smokeless tobacco: Never    Tobacco comments:     quit for 25 years, restarted in 2014.  Pt states he smokes less than 1 cigarette per week since 10/2021   Vaping Use    Vaping status: Never Used   Substance Use Topics    Alcohol use: Yes     Alcohol/week: 2.0 standard drinks of alcohol     Types: 2 Cans of beer per week     Comment: daily    Drug use: No         PHYSICAL EXAM:***  VITAL SIGNS:   There were no vitals filed for this visit.        General:   Pleasant 89 y.o.male in no acute distress, WDWN   Eyes:   Wearing glasses, conjunctiva and sclera not inflamed. Tears appear adequate.    ENT:   No oropharyngeal lesions. Mucous membranes moist. No temporal tenderness.   No TA tenderness  No scalp tenderness   Lymph:   No masses or cervical lymphadenopathy.    Cardiovascular:  Regular rate and rhythm. No murmur, rub, or gallop. No lower extremity edema.   Radial pulses 2+ bilaterally    Lungs:  Clear to auscultation.Normal respiratory effort.    Musculoskeletal:   General: Ambulates w/o assistance   Hands: No swelling or tenderness. Able to make a tight fist b/l   Wrists:FROM w/o swelling or tenderness   Elbows: FROM w/o swelling or tenderness  Shoulders: FROM w/o pain   Knees: FROM w/o effusions   Ankles: No swelling or tenderness    Neurological:  CN 2-12 grossly intact. Strength grossly intact.    Psych:  Appropriate affect and mood   Skin:  No rashes.       Labs/Imaging/Other:    Lab Results   Component Value Date    WBC 7.8 11/05/2023    HGB 11.7 (L) 11/05/2023    HCT 34.0 (L) 11/05/2023    PLT 212 11/05/2023       Lab Results   Component Value Date    NA 138 12/26/2022    K 4.3 12/26/2022    CL 104 12/26/2022    CO2 24.0 12/26/2022    BUN 17 07/22/2023    CREATININE 1.20 (H) 11/05/2023    GLU 98 12/26/2022    CALCIUM 8.7 12/26/2022    MG 1.6 10/27/2021       Lab Results   Component Value Date    BILITOT 0.5 12/26/2022    PROT 6.6 12/26/2022    ALBUMIN 4.3 07/22/2023    ALT 11 11/05/2023    AST 22 11/05/2023    ALKPHOS 56 12/26/2022       Lab Results   Component Value Date    PT 9.8 (L) 12/15/2017    INR 0.86 12/15/2017    APTT 27.1 (L) 12/15/2017

## 2024-06-18 DIAGNOSIS — G894 Chronic pain syndrome: Principal | ICD-10-CM

## 2024-06-18 DIAGNOSIS — M51369 DDD (degenerative disc disease), lumbar: Principal | ICD-10-CM

## 2024-06-18 NOTE — Unmapped (Signed)
 Patient is requesting the following refill  Requested Prescriptions     Pending Prescriptions Disp Refills    oxyCODONE -acetaminophen  (PERCOCET ) 5-325 mg per tablet 120 tablet 0     Sig: Take 1 tablet by mouth every six (6) hours as needed for pain.       Last refill given on: 05/21/2024 with 120 count and 0 refills.     Recent Visits  Date Type Provider Dept   02/03/24 Office Visit Mangel, Benison Pap, DO [facility] [address] [facility]   07/22/23 Office Visit Mangel, Benison Pap, DO [facility] [address] [facility]   06/19/23 Office Visit Derenda Rockers, MD [facility] [address] [facility]   Showing recent visits within past 365 days and meeting all other requirements  Future Appointments  No visits were found meeting these conditions.  Showing future appointments within next 365 days and meeting all other requirements       Opioid Monitoring   Urine Tox Screen Last Drug Screen Date: Not Found  Opiate Confirmation Test Last Drug Screen Date: Not Found  Last Opioid Dispensed Provider: Benison Pap Mangel, DO  Prescribed MEDD : 30  Last PDMP Review: 04/22/2024  3:46 PM  Last Opioid Pain Agreement Signed Date: 03/06/2023  Last Non Opioid Controlled Substance Pain Agreement Signed Date: Not Found    Naloxone Ordered: Not Found  Last OV: 02/03/2024

## 2024-06-18 NOTE — Unmapped (Signed)
 Addended by: JERRELL MARJORIE HERO on: 06/18/2024 10:48 AM     Modules accepted: Orders

## 2024-06-18 NOTE — Unmapped (Signed)
 Copied from CRM #[id number]. Topic: Access To Clinicians - Medication Refill  >> Jun 18, 2024  9:11 AM Suzen RAMAN wrote:  The caller reports that they have previously requested refill from pharmacy, but have not received authorization yet. Patient is running low on medication, and requested to be filled on 6/29. He would like a return call when it's been sent and when the pick up date will be. Please advise.      The patient is requesting the following:     Medication(s) for refill: oxyCODONE -acetaminophen  (PERCOCET ) 5-325 mg per tablet     Quantity for 1 month supply    Pharmacy name and address: [facility] [phone] [address] [place], [place] - [address]  [address] [place], [place] [address]  Phone: [phone]  Fax: [phone]        Please contact The patient by Cell Phone in regards to this request.    Coverage: yes, coverage is accurate on file.    Medication request callback turnaround time: 72 business hours. Programmer, systems Notified)

## 2024-06-20 MED ORDER — OXYCODONE-ACETAMINOPHEN 5 MG-325 MG TABLET
ORAL_TABLET | Freq: Four times a day (QID) | ORAL | 0 refills | 30.00000 days | Status: CP | PRN
Start: 2024-06-20 — End: 2024-07-20

## 2024-07-02 DIAGNOSIS — I25810 Atherosclerosis of coronary artery bypass graft(s) without angina pectoris: Principal | ICD-10-CM

## 2024-07-02 DIAGNOSIS — R39198 Other difficulties with micturition: Principal | ICD-10-CM

## 2024-07-02 DIAGNOSIS — I10 Essential (primary) hypertension: Principal | ICD-10-CM

## 2024-07-02 MED ORDER — HYDRALAZINE 25 MG TABLET
ORAL_TABLET | Freq: Every day | ORAL | 0 refills | 90.00000 days | Status: CP
Start: 2024-07-02 — End: ?

## 2024-07-02 MED ORDER — SIMVASTATIN 10 MG TABLET
ORAL_TABLET | Freq: Every day | ORAL | 0 refills | 90.00000 days | Status: CP
Start: 2024-07-02 — End: ?

## 2024-07-02 MED ORDER — TAMSULOSIN 0.4 MG CAPSULE
ORAL_CAPSULE | Freq: Every day | ORAL | 0 refills | 90.00000 days | Status: CP
Start: 2024-07-02 — End: ?

## 2024-07-02 MED ORDER — ATENOLOL 50 MG TABLET
ORAL_TABLET | ORAL | 0 refills | 0.00000 days | Status: CP
Start: 2024-07-02 — End: ?

## 2024-07-02 MED ORDER — LOSARTAN 100 MG TABLET
ORAL_TABLET | ORAL | 0 refills | 0.00000 days | Status: CP
Start: 2024-07-02 — End: ?

## 2024-07-02 NOTE — Unmapped (Signed)
 Patient is requesting the following refill  Requested Prescriptions     Pending Prescriptions Disp Refills    atenolol  (TENORMIN ) 50 MG tablet [Pharmacy Med Name: Atenolol  50 MG Oral Tablet] 180 tablet 0     Sig: Take 1 tablet by mouth twice daily    hydrALAZINE  (APRESOLINE ) 25 MG tablet [Pharmacy Med Name: hydrALAZINE  HCl 25 MG Oral Tablet] 90 tablet 0     Sig: Take 1 tablet by mouth once daily    losartan  (COZAAR ) 100 MG tablet [Pharmacy Med Name: Losartan  Potassium 100 MG Oral Tablet] 45 tablet 0     Sig: Take 1/2 (one-half) tablet by mouth once daily    simvastatin  (ZOCOR ) 10 MG tablet [Pharmacy Med Name: Simvastatin  10 MG Oral Tablet] 90 tablet 0     Sig: Take 1 tablet by mouth once daily    tamsulosin  (FLOMAX ) 0.4 mg capsule [Pharmacy Med Name: Tamsulosin  HCl 0.4 MG Oral Capsule] 90 capsule 0     Sig: Take 1 capsule by mouth once daily       Recent Visits  Date Type Provider Dept   02/03/24 Office Visit Mangel, Benison Pap, DO [facility] [address] [facility]   07/22/23 Office Visit Mangel, Benison Pap, DO [facility] [address] [facility]   Showing recent visits within past 365 days and meeting all other requirements  Future Appointments  No visits were found meeting these conditions.  Showing future appointments within next 365 days and meeting all other requirements       Labs: Not applicable this refill

## 2024-07-02 NOTE — Unmapped (Signed)
 Copied from CRM #[id number]. Topic: Access To Clinicians - Medication Refill  >> Jul 02, 2024 10:39 AM Tillman HERO wrote:  The caller reports that they have previously requested refill from pharmacy, but have not received authorization yet.     The patient is requesting the following:     Medication(s) for refill:     simvastatin  (ZOCOR ) 10 MG tablet [11364]    losartan  (COZAAR ) 100 MG tablet [22588]    tamsulosin  (FLOMAX ) 0.4 mg capsule [103890]    hydrALAZINE  (APRESOLINE ) 25 MG tablet [3700]    Pharmacy name and address: [facility] [address] [place] - [address]  [address] [place] [address]  Phone: [phone]  Fax: [phone]    Please contact The patient by Cell Phone in regards to this request.    Coverage: yes, coverage is accurate on file.    Urgent callback turnaround time: within 24 business hours. (Caller Notified)    Urgent Reason: Almost or completely out of medication(s)

## 2024-07-16 DIAGNOSIS — M51369 DDD (degenerative disc disease), lumbar: Principal | ICD-10-CM

## 2024-07-16 DIAGNOSIS — G894 Chronic pain syndrome: Principal | ICD-10-CM

## 2024-07-16 MED ORDER — OXYCODONE-ACETAMINOPHEN 5 MG-325 MG TABLET
ORAL_TABLET | Freq: Four times a day (QID) | ORAL | 0 refills | 30.00000 days | Status: CP | PRN
Start: 2024-07-16 — End: 2024-08-15

## 2024-07-16 NOTE — Unmapped (Signed)
 Addended by: ANN, Rhegan Trunnell on: 07/16/2024 10:55 AM     Modules accepted: Orders

## 2024-07-16 NOTE — Unmapped (Signed)
 Copied from CRM #[id number]. Topic: Access To Clinicians - Medication Refill  >> Jul 16, 2024  9:24 AM Alan SQUIBB wrote:  The caller reports that they have previously requested refill from pharmacy, but have not received authorization yet.     The patient is requesting the following:     Medication(s) for refill: oxyCODONE -acetaminophen  (PERCOCET ) 5-325 mg per tablet   Quantity for 1 month supply      Pharmacy name and address: [id number] - [place], [place] - [address]      Please contact The patient by Cell Phone in regards to this request.    Coverage: yes, coverage is accurate on file.    Medication request callback turnaround time: 72 business hours. Programmer, systems Notified)

## 2024-07-16 NOTE — Unmapped (Signed)
 Pt will be out, needs them on Sunday - pt ask to please call once the refill is sent in

## 2024-07-16 NOTE — Unmapped (Signed)
 Patient is requesting the following refill  Requested Prescriptions     Pending Prescriptions Disp Refills    oxyCODONE -acetaminophen  (PERCOCET ) 5-325 mg per tablet 120 tablet 0     Sig: Take 1 tablet by mouth every six (6) hours as needed for pain.       Recent Visits  Date Type Provider Dept   02/03/24 Office Visit Mangel, Benison Pap, DO [facility] [address] [facility] [place]   07/22/23 Office Visit Mangel, Benison Pap, DO [facility] [address] [facility]   Showing recent visits within past 365 days and meeting all other requirements  Future Appointments  No visits were found meeting these conditions.  Showing future appointments within next 365 days and meeting all other requirements       Labs: Not applicable this refill

## 2024-08-06 NOTE — Unmapped (Signed)
 Returned patient's call. Patient states that he took his blood pressure and heart rate this morning 1st- 122/59 HR88, twenty minutes later BP 121/59 HR84. Patient states that this is a high heart rate for him. Patient states that he used his nebulizer treatment and then attempted to check blood pressure but would not register/read on machine. Patient concerned that nebulizers are causing his blood pressure monitor not to work and his heart rate to increase.    RN confirmed patient using pulmocort and Duo-Neb twice daily. Patient states that heart rate goes up after use and he is concerned about this. Patient informed that Duo-Neb causes increased heart rate as a side effect. Patient states that nebulizer treatments are helping, he has not had chest pain, blood pressure has been good. Patient agreeable to taking pulmocort nebulizer twice a day and Duo-Neb once a day. Patient has taken Duo-Neb today, so will skip tonight's dose and will take again tomorrow night.     Patient states he will call clinic on Monday to inform how his weekend went. Patient informed that if he starts to have chest pain and/or heart rate gets up to 110-120 or more and does not come down, go to Emergency Department. Patient verbalized understanding.

## 2024-08-06 NOTE — Unmapped (Addendum)
 Copied from CRM [id number]. Topic: Access To Clinicians - Req Clinic Call Back  >> Aug 06, 2024  8:08 AM Ashby T wrote:  Reason of the Call: Call to speak with Dr. Keven    Requesting: Patient is requesting a call from the front desk staff.    Supporting details:Patient did not give any details about what is needed other than wanting to speak with Dr. Mangel.   O[facility] unsuccessfully tried to connect patient with FD.  Patient's tone was demanding throughout interaction and was not open to alternate solutions. Patient hung up when offered to send a message on his behalf.    Appointment: No appointment scheduled      The patient preferred contact: Cell Phone Telephone Information:  Mobile          [phone]    Routine callback turnaround time: 24-48 business hours. Programmer, systems Notified)

## 2024-08-13 ENCOUNTER — Emergency Department
Admit: 2024-08-13 | Discharge: 2024-08-13 | Disposition: A | Discharge status: Home / Self Care | Payer: Medicare (Managed Care)

## 2024-08-13 DIAGNOSIS — M51369 DDD (degenerative disc disease), lumbar: Principal | ICD-10-CM

## 2024-08-13 DIAGNOSIS — G894 Chronic pain syndrome: Principal | ICD-10-CM

## 2024-08-13 DIAGNOSIS — R7989 Other specified abnormal findings of blood chemistry: Principal | ICD-10-CM

## 2024-08-13 DIAGNOSIS — I4891 Unspecified atrial fibrillation: Principal | ICD-10-CM

## 2024-08-13 DIAGNOSIS — I499 Cardiac arrhythmia, unspecified: Principal | ICD-10-CM

## 2024-08-13 LAB — APTT
APTT: 31.1 s (ref 24.8–38.4)
HEPARIN CORRELATION: 0.2

## 2024-08-13 LAB — CBC W/ AUTO DIFF
BASOPHILS ABSOLUTE COUNT: 0.1 10*9/L (ref 0.0–0.1)
BASOPHILS RELATIVE PERCENT: 0.7 %
EOSINOPHILS ABSOLUTE COUNT: 0.3 10*9/L (ref 0.0–0.5)
EOSINOPHILS RELATIVE PERCENT: 4 %
HEMATOCRIT: 34.3 % — ABNORMAL LOW (ref 39.0–48.0)
HEMOGLOBIN: 11.8 g/dL — ABNORMAL LOW (ref 12.9–16.5)
LYMPHOCYTES ABSOLUTE COUNT: 1.3 10*9/L (ref 1.1–3.6)
LYMPHOCYTES RELATIVE PERCENT: 15.5 %
MEAN CORPUSCULAR HEMOGLOBIN CONC: 34.6 g/dL (ref 32.0–36.0)
MEAN CORPUSCULAR HEMOGLOBIN: 33.4 pg — ABNORMAL HIGH (ref 25.9–32.4)
MEAN CORPUSCULAR VOLUME: 96.6 fL — ABNORMAL HIGH (ref 77.6–95.7)
MEAN PLATELET VOLUME: 7.4 fL (ref 6.8–10.7)
MONOCYTES ABSOLUTE COUNT: 0.8 10*9/L (ref 0.3–0.8)
MONOCYTES RELATIVE PERCENT: 9.1 %
NEUTROPHILS ABSOLUTE COUNT: 5.8 10*9/L (ref 1.8–7.8)
NEUTROPHILS RELATIVE PERCENT: 70.7 %
NUCLEATED RED BLOOD CELLS: 0 /100{WBCs} (ref <=4)
PLATELET COUNT: 282 10*9/L (ref 150–450)
RED BLOOD CELL COUNT: 3.55 10*12/L — ABNORMAL LOW (ref 4.26–5.60)
RED CELL DISTRIBUTION WIDTH: 14 % (ref 12.2–15.2)
WBC ADJUSTED: 8.2 10*9/L (ref 3.6–11.2)

## 2024-08-13 LAB — COMPREHENSIVE METABOLIC PANEL
ALBUMIN: 4.1 g/dL (ref 3.4–5.0)
ALKALINE PHOSPHATASE: 74 U/L (ref 46–116)
ALT (SGPT): 14 U/L (ref 10–49)
ANION GAP: 15 mmol/L — ABNORMAL HIGH (ref 5–14)
AST (SGOT): 19 U/L (ref <=34)
BILIRUBIN TOTAL: 0.7 mg/dL (ref 0.3–1.2)
BLOOD UREA NITROGEN: 14 mg/dL (ref 9–23)
BUN / CREAT RATIO: 12
CALCIUM: 10.3 mg/dL (ref 8.7–10.4)
CHLORIDE: 100 mmol/L (ref 98–107)
CO2: 26.4 mmol/L (ref 20.0–31.0)
CREATININE: 1.21 mg/dL — ABNORMAL HIGH (ref 0.73–1.18)
EGFR CKD-EPI (2021) MALE: 58 mL/min/1.73m2 — ABNORMAL LOW (ref >=60)
GLUCOSE RANDOM: 113 mg/dL (ref 70–179)
POTASSIUM: 4.1 mmol/L (ref 3.4–4.8)
PROTEIN TOTAL: 6.8 g/dL (ref 5.7–8.2)
SODIUM: 141 mmol/L (ref 135–145)

## 2024-08-13 LAB — MAGNESIUM: MAGNESIUM: 1.8 mg/dL (ref 1.6–2.6)

## 2024-08-13 LAB — PROTIME-INR
INR: 1.07
PROTIME: 12.2 s (ref 9.9–12.6)

## 2024-08-13 LAB — TSH: THYROID STIMULATING HORMONE: 2.463 u[IU]/mL (ref 0.550–4.780)

## 2024-08-13 LAB — LACTATE SEPSIS, VENOUS: LACTATE BLOOD VENOUS: 1.5 mmol/L (ref 0.5–1.8)

## 2024-08-13 LAB — PRO-BNP: PRO-BNP: 6102 pg/mL — ABNORMAL HIGH (ref <=300.0)

## 2024-08-13 LAB — HIGH SENSITIVITY TROPONIN I - SINGLE: HIGH SENSITIVITY TROPONIN I: 22 ng/L (ref <=53)

## 2024-08-13 MED ORDER — APIXABAN 2.5 MG TABLET
ORAL_TABLET | Freq: Two times a day (BID) | ORAL | 3 refills | 30.00000 days | Status: CP
Start: 2024-08-13 — End: ?

## 2024-08-13 MED ORDER — OXYCODONE-ACETAMINOPHEN 5 MG-325 MG TABLET
ORAL_TABLET | Freq: Four times a day (QID) | ORAL | 0 refills | 30.00000 days | Status: CP | PRN
Start: 2024-08-13 — End: 2024-09-12

## 2024-08-13 MED ADMIN — apixaban (ELIQUIS) tablet 2.5 mg: 2.5 mg | ORAL | @ 20:00:00 | Stop: 2024-08-13

## 2024-08-13 NOTE — Unmapped (Signed)
 BIBACEMS for irregular heart rate. Denies hx of afib. No CP or SOB. Pt also cut nose while shaving this morning and would like it looked at. No active bleeding noted on arrival.

## 2024-08-13 NOTE — Unmapped (Signed)
 Copied from CRM #[id number]. Topic: Access To Clinicians - Medication Refill  >> Aug 13, 2024  8:22 AM Hildreth ORN wrote:      The patient is requesting the following:     Medication(s): oxyCODONE -acetaminophen  (PERCOCET ) 5-325 mg per tablet     Quantity for 1 month supply    Pharmacy name and address: [facility]        Coverage: yes, coverage is accurate on file.    Urgent turnaround time: within 24 business hours. (Caller Notified)    Urgent Reason: Has 2 days or less of medication(s) remaining.       Does the caller want to be contacted regarding this request? Yes. Please contact The patient by Cell Phone Telephone Information:  Mobile          [phone]

## 2024-08-13 NOTE — Unmapped (Signed)
 Bed: 11  Expected date:   Expected time:   Means of arrival:   Comments:  Maybe ems

## 2024-08-13 NOTE — Unmapped (Signed)
.  ISAR Screening- for all patients 75+ community-dwelling patients    Before the illness or injury that brought you to the Emergency Department, did you need someone to help you regulary?no   Since the illness or injury that brought you to the Emergency Department, have you needed more help than usual to take care of yourself?no  Have you been hospitalized for one or more nights during the past six months (excluding a stay in the Emergency Department)?no  In general, do you see well?yes  In general, do you have serious problems with your memory? no  Do you take more than five different prescribed medications every day? no    A score of 3 or more will require a Case Management consultation. Paged [phone]. Primary RN notified of score of 3 or more and CM paged for assessment.

## 2024-08-13 NOTE — Unmapped (Signed)
 [facility]  Emergency Department Provider Note     ED Clinical Impression     Final diagnoses:   Irregular heart beat   Atrial fibrillation, unspecified type     (Primary)   Elevated brain natriuretic peptide (BNP) level      Impression, Medical Decision Making, ED Course     Impression: 89 y.o. male with PMH as described below presenting to the emergency department today for evaluation of palpitations and irregular heartbeat.  Medical Decision Making  An 89 year old male with a history of triple coronary artery bypass grafting and COPD presented after a nasal laceration from a shaver resulted in significant epistaxis, now resolved. He also reported recent episodes of elevated heart rate (up to 118 bpm) with a fluttering sensation but denied chest pain or ongoing shortness of breath. He has a history of COPD, previously prescribed oxygen  but not using it, and recently discontinued albuterol  due to concerns about tachycardia. On exam, his nasal bleeding had stopped and he was hemodynamically stable.    Second EKG shows atrial fibrillation with an incomplete right bundle branch block.  He is currently rate controlled.  CHA2DS2-VASc score of 4, discussed case with cardiology on-call, Dr. Sedalia, plan currently is to discontinue his aspirin , initiate Eliquis , discharge to the outpatient atrial fibrillation clinic for rapid transition.  An appointment has been set for August 17, 2024 at 1 PM to be seen at outpatient patient A-fib transition clinic.  I have discussed all of these details with the patient and he is agreeable to plan.  At this time he is hemodynamically stable and no further workup will be pursued in the emergency department.  He is appropriate and safe for discharge at this time.           Diagnostic workup as below.     Orders Placed This Encounter   Procedures    XR Chest 2 views    Pro-BNP    CBC w/ Differential    Comprehensive Metabolic Panel    Lactate Sepsis, Venous    Magnesium     PT-INR    PTT TSH    HS Troponin 0h    Urinalysis with Microscopy with Culture Reflex (Clean Catch)    ECG 12 Lead    ECG 12 Lead    ECG 12 Lead    Insert peripheral IV     Results for orders placed or performed during the hospital encounter of 08/13/24   Pro-BNP   Result Value Ref Range    PRO-BNP 6,102.0 (H) <=300.0 pg/mL   Comprehensive Metabolic Panel   Result Value Ref Range    Sodium 141 135 - 145 mmol/L    Potassium 4.1 3.4 - 4.8 mmol/L    Chloride 100 98 - 107 mmol/L    CO2 26.4 20.0 - 31.0 mmol/L    Anion Gap 15 (H) 5 - 14 mmol/L    BUN 14 9 - 23 mg/dL    Creatinine 8.78 (H) 0.73 - 1.18 mg/dL    BUN/Creatinine Ratio 12     eGFR CKD-EPI (2021) Male 58 (L) >=60 mL/min/1.60m2    Glucose 113 70 - 179 mg/dL    Calcium  10.3 8.7 - 10.4 mg/dL    Albumin 4.1 3.4 - 5.0 g/dL    Total Protein 6.8 5.7 - 8.2 g/dL    Total Bilirubin 0.7 0.3 - 1.2 mg/dL    AST 19 <=65 U/L    ALT 14 10 - 49 U/L    Alkaline Phosphatase 74  46 - 116 U/L   Lactate Sepsis, Venous   Result Value Ref Range    Lactate, Venous 1.5 0.5 - 1.8 mmol/L   Magnesium    Result Value Ref Range    Magnesium  1.8 1.6 - 2.6 mg/dL   PT-INR   Result Value Ref Range    PT 12.2 9.9 - 12.6 sec    INR 1.07    PTT   Result Value Ref Range    APTT 31.1 24.8 - 38.4 sec    Heparin Correlation 0.2    TSH   Result Value Ref Range    TSH 2.463 0.550 - 4.780 uIU/mL   HS Troponin 0h   Result Value Ref Range    hsTroponin I 22 <=53 ng/L   ECG 12 Lead   Result Value Ref Range    EKG Systolic BP  mmHg    EKG Diastolic BP  mmHg    EKG Ventricular Rate 108 BPM    EKG Atrial Rate  BPM    EKG P-R Interval  ms    EKG QRS Duration 92 ms    EKG Q-T Interval 304 ms    EKG QTC Calculation 407 ms    EKG Calculated P Axis  degrees    EKG Calculated R Axis 91 degrees    EKG Calculated T Axis 256 degrees    QTC Fredericia 369 ms   ECG 12 Lead   Result Value Ref Range    EKG Systolic BP  mmHg    EKG Diastolic BP  mmHg    EKG Ventricular Rate 108 BPM    EKG Atrial Rate  BPM    EKG P-R Interval  ms    EKG QRS Duration 92 ms    EKG Q-T Interval 304 ms    EKG QTC Calculation 407 ms    EKG Calculated P Axis  degrees    EKG Calculated R Axis 91 degrees    EKG Calculated T Axis 256 degrees    QTC Fredericia 369 ms   ECG 12 Lead   Result Value Ref Range    EKG Systolic BP  mmHg    EKG Diastolic BP  mmHg    EKG Ventricular Rate 84 BPM    EKG Atrial Rate  BPM    EKG P-R Interval  ms    EKG QRS Duration 92 ms    EKG Q-T Interval 370 ms    EKG QTC Calculation 437 ms    EKG Calculated P Axis  degrees    EKG Calculated R Axis 27 degrees    EKG Calculated T Axis 24 degrees    QTC Fredericia 413 ms   CBC w/ Differential   Result Value Ref Range    WBC 8.2 3.6 - 11.2 10*9/L    RBC 3.55 (L) 4.26 - 5.60 10*12/L    HGB 11.8 (L) 12.9 - 16.5 g/dL    HCT 65.6 (L) 60.9 - 48.0 %    MCV 96.6 (H) 77.6 - 95.7 fL    MCH 33.4 (H) 25.9 - 32.4 pg    MCHC 34.6 32.0 - 36.0 g/dL    RDW 85.9 87.7 - 84.7 %    MPV 7.4 6.8 - 10.7 fL    Platelet 282 150 - 450 10*9/L    nRBC 0 <=4 /100 WBCs    Neutrophils % 70.7 %    Lymphocytes % 15.5 %    Monocytes % 9.1 %    Eosinophils % 4.0 %  Basophils % 0.7 %    Absolute Neutrophils 5.8 1.8 - 7.8 10*9/L    Absolute Lymphocytes 1.3 1.1 - 3.6 10*9/L    Absolute Monocytes 0.8 0.3 - 0.8 10*9/L    Absolute Eosinophils 0.3 0.0 - 0.5 10*9/L    Absolute Basophils 0.1 0.0 - 0.1 10*9/L           MDM Elements  Discussion of Management with other Physicians, QHP or Appropriate Source: Consultant - cardiology  Independent Interpretation of Studies: EKG(s) - atrial fibrillation with incomplete right bundle branch block as reviewed by me.  I have reviewed recent and relevant previous record, including: Outpatient notes - all available outpatient notes and inpatient notes.       History     Chief Complaint  Chief Complaint   Patient presents with    Irregular Heart Beat     History of Present Illness  Erik [name] is an 89 year old male with a history of triple bypass surgery and COPD who presents with an erratic pulse rate and nasal bleeding.    He noticed an erratic pulse rate this morning, measuring 118 bpm, despite having a 'perfect' blood pressure. He describes a 'flutter' and unusual heart rhythm. He regularly monitors his blood pressure and pulse rate, checking it three times each morning and throughout the day. He has maintained good heart health since his triple bypass surgery over 20 years ago. However, in the last few days, the elevated pulse rate has been bothersome. No chest pain or shortness of breath is associated with the high heart rate, although he experienced a gasping sensation one night, which resolved after sitting up and cooling down.    He reports a recent episode of epistaxis after cutting his nose with a shaver, which led to significant bleeding and required EMS intervention. He uses nasal tape and a nasal spray occasionally, and sometimes experiences a runny nose in the morning.    He has a history of COPD, for which he uses a nebulizer with two different solutions twice daily. He quit smoking after being diagnosed with COPD and was previously sent home with oxygen , which he rarely uses. He finds that sitting in air conditioning or drinking cold beverages helps lower his pulse rate.        Past Medical History[1]    Past Surgical History[2]    Active Medications[3]     Allergies[4]    Family History[5]    Short Social History[6]     Physical Exam     VITAL SIGNS:      Vitals:    08/13/24 1110 08/13/24 1348   BP: 131/76 113/63   Pulse: 110 104   Resp: 18 20   Temp: 36.6 ??C (97.8 ??F) 36.3 ??C (97.4 ??F)   TempSrc: Oral Oral   SpO2: 96% 94%       Constitutional: Alert and oriented. No acute distress.  Eyes: Conjunctivae are normal.  HEENT: Normocephalic and atraumatic. Conjunctivae clear. No congestion. Moist mucous membranes.   Cardiovascular: Rate as above, regular rhythm. Normal and symmetric distal pulses. Brisk capillary refill. Normal skin turgor.  Respiratory: Normal respiratory effort. Breath sounds are normal. There are no wheezing or crackles heard.  Gastrointestinal: Soft, non-distended, non-tender.  Genitourinary: Deferred.  Musculoskeletal: Non-tender with normal range of motion in all extremities.  Neurologic: Normal speech and language. No gross focal neurologic deficits are appreciated. Patient is moving all extremities equally, face is symmetric at rest and with speech.  Skin: Skin  is warm, dry and intact. No rash noted.  Psychiatric: Mood and affect are normal. Speech and behavior are normal.     Radiology     XR Chest 2 views   Final Result      Negative chest.                   Pertinent labs & imaging results that were available during my care of the patient were independently interpreted by me and considered in my medical decision making (see chart for details).    Portions of this record have been created using Scientist, clinical (histocompatibility and immunogenetics). Dictation errors have been sought, but may not have been identified and corrected.           [1]   Past Medical History:  Diagnosis Date    Cataract 2005    cataract surgery    COPD (chronic obstructive pulmonary disease)     10/07/2016    DJD (degenerative joint disease), lumbar     Hypertension     Macular degeneration of right eye 10/17/2022    Mild anemia 01/15/2022    Tobacco abuse 02/15/2014   [2]   Past Surgical History:  Procedure Laterality Date    CARDIAC VALVE REPLACEMENT      triple bypass    CATARACT EXTRACTION      CORONARY ARTERY BYPASS GRAFT  2001    x3    EYE SURGERY  2005    cataracts    PR REPAIR ING HERNIA,5+Y/O,REDUCIBL Left 06/19/2015    Procedure: REPAIR INITIAL INGUINAL HERNIA, AGE 49 YEARS OR OLDER; REDUCIBLE;  Surgeon: Curtistine DELENA Poisson, MD;  [facility];  Service: Trauma    PR TEMPORAL ARTERY LIGATN OR BX Left 10/23/2017    Procedure: LIGATION OR BIOPSY, TEMPORAL ARTERY;  Surgeon: Fairy Lum Gaskins, MD;  Location: [facility] OR [facility];  Service: ENT    PR UPPER GI ENDOSCOPY,DIAGNOSIS N/A 12/17/2017    Procedure: UGI ENDO, INCLUDE ESOPHAGUS, STOMACH, & DUODENUM &/OR JEJUNUM; DX W/WO COLLECTION SPECIMN, BY BRUSH OR WASH;  Surgeon: Sean Easterly, MD;  Location: [facility];  Service: Gastroenterology   [3]   No current facility-administered medications for this encounter.     Current Outpatient Medications   Medication Sig Dispense Refill    albuterol  HFA 90 mcg/actuation inhaler Inhale 2 puffs every six (6) hours as needed for wheezing. 24 g 3    amlodipine  (NORVASC ) 10 MG tablet Take 1 tablet (10 mg total) by mouth daily. 90 tablet 3    ascorbic acid (VITAMIN C) 500 MG tablet Take 1 tablet (500 mg total) by mouth daily.      aspirin  (ECOTRIN) 81 MG tablet Take 1 tablet (81 mg total) by mouth daily.      atenolol  (TENORMIN ) 50 MG tablet Take 1 tablet by mouth twice daily 180 tablet 0    budesonide  (PULMICORT ) 0.5 mg/2 mL nebulizer solution Inhale 2 mL (0.5 mg total) by nebulization two (2) times a day. 120 mL 11    calcium  carbonate (OS-CAL) 1,500 mg (600 mg elem calcium ) tablet Take 1 tablet (600 mg elem calcium  total) by mouth two (2) times a day.      calcium -vitamin D 500 mg-5 mcg (200 unit) per tablet Take 1 tablet by mouth daily.      cyanocobalamin , vitamin B-12, 1000 MCG tablet Take 1 tablet (1,000 mcg total) by mouth daily.      furosemide  (LASIX ) 20 MG tablet Take 0.5-1 tablets (  10-20 mg total) by mouth daily as needed for swelling. 90 tablet 1    hydrALAZINE  (APRESOLINE ) 25 MG tablet Take 1 tablet by mouth once daily 90 tablet 0    ipratropium-albuterol  (DUO-NEB) 0.5-2.5 mg/3 mL nebulizer Inhale 3 mL by nebulization two (2) times a day. 3 mL 11    losartan  (COZAAR ) 100 MG tablet Take 1/2 (one-half) tablet by mouth once daily 45 tablet 0    mv-mins/folic/lycopene/ginkgo (ONE-A-DAY MEN'S 50+ ADVANTAGE ORAL) Take by mouth.      oxyCODONE -acetaminophen  (PERCOCET ) 5-325 mg per tablet Take 1 tablet by mouth every six (6) hours as needed for pain. 120 tablet 0    simvastatin  (ZOCOR ) 10 MG tablet Take 1 tablet by mouth once daily 90 tablet 0    tamsulosin  (FLOMAX ) 0.4 mg capsule Take 1 capsule by mouth once daily 90 capsule 0    tocilizumab  (ACTEMRA ) 162 mg/0.9 mL Syrg subcutaneous injection Inject 0.9 mL (162 mg total) under the skin every twenty-one (21) days. 5.4 mL 3   [4] No Known Allergies  [5]   Family History  Problem Relation Age of Onset    Thyroid disease Sister     Cancer Brother     Glaucoma Neg Hx     Macular degeneration Neg Hx     Retinal detachment Neg Hx     Substance Abuse Disorder Neg Hx     Mental illness Neg Hx    [6]   Social History  Tobacco Use    Smoking status: Some Days     Current packs/day: 0.00     Types: Cigarettes    Smokeless tobacco: Never    Tobacco comments:     quit for 25 years, restarted in 2014. Denies any use of tobacco at this time   Vaping Use    Vaping status: Never Used   Substance Use Topics    Alcohol use: Yes     Alcohol/week: 1.0 standard drink of alcohol     Types: 1 Cans of beer per week     Comment: daily    Drug use: No        Marsa Mabel Barrio, FNP  08/13/24 [phone]

## 2024-08-16 DIAGNOSIS — I10 Essential (primary) hypertension: Principal | ICD-10-CM

## 2024-08-16 MED ORDER — FUROSEMIDE 20 MG TABLET
ORAL_TABLET | 0 refills | 0.00000 days
Start: 2024-08-16 — End: ?

## 2024-08-16 MED ORDER — AMLODIPINE 10 MG TABLET
ORAL_TABLET | Freq: Every day | ORAL | 0 refills | 0.00000 days
Start: 2024-08-16 — End: ?

## 2024-08-16 NOTE — Unmapped (Signed)
 A-Fib Transitions Clinic    Referring Provider(s): Mabel Blunt, FNP  Primary Provider: Keven Crumbly Pap, DO  Primary Cardiologist:      Reason for visit:   1. Atrial fibrillation, unspecified type              Assessment and Plan:  Erik [name] is a 89 y.o. with a history of CAD s/p CABG (2001) and COPD seen in consultation at the request of Mabel Blunt after recent ED visit found to be in newly diagnosed atrial fibrillation.    Atrial Fibrillation  Patient presented to the ED on 08/13/24 after noting elevated heart rate, found to be in atrial fibrillation. He was rate controlled and started on apixaban . No prior history of AF.     Rhythm Control:   - SAF Class: 2 (Minor effect on QOL)  - 12 Lead EKG: Atrial fibrillation with RVR, rate 106  - Rhythm control plan: Discussed option for rhythm control with DCCV given this is his first presentation of A-fib either with TEE or waiting 3 weeks after starting anticoagulation.  At this point he is feeling okay and would prefer to avoid TEE.  Will arrange for DCCV after patient has been on apixaban  for 3 weeks. Will keep us  updated and can arrange for TEE if he becomes more symptomatic or heart rate difficult to control.     Rate Control:  (target HR<100 at rest)  - Scheduled rate control: On atenolol  50mg  daily for HTN  - PRN  rate control: metoprolol  tartrate 25mg  q8 hours as needed for HR >100      Anticoagulation:   Shared decision making was performed.  Anticoagulation plan: Has not filled apixaban  due to cost of $300/month. Discussed with social worker, patient will meet deductible after the first fill and then will be $47/month. Also has option for payment plan. Xarelto would be the same cost. Discussed warfarin as an alternative but he is not interested in routine lab monitoring. After much discussion, patient will fill apixaban  prescription. Free 30 day card also provided. Could consider [facility] in unable to afford/tolerate long-term anticoagulation.   - Start apixaban  2.5mg  BID (age >42, weight borderline at 60.7kg)     CHA2DS2-VASc: Hypertension (1), Age [>/= 75 years] (2), and Vascular disease [prior MI, PAD or aotric plaque] (1)   Score (Adjusted Stroke Rate %/year): 4 (4%/year)  HAS-BLED: Age > 65 years (1) or Drugs that increase bleeding risk (e.g antiplatelent agent, NSAID etc. (1 point/drug) apixaban    Score (Bleeds/100 patient-years): 2 (1.88)       2. Risk Factor Management (Upstream Therapy): Patient was educated on the relationship between the following risk factors and atrial fibrillation.   AF Risk Factors identified: Hypertension and Cardiac Surgery CABG  Advised followup with PCP for further optimization of these risk factors.     OSA Screening:    STOP-Bang  Score: 3  Known diagnosis of sleep apnea? No.  Adherent to prescribed CPAP therapy? NA   Referral: Not referred; patient declines.      Follow-up:     Follow up post cardioversion    _________________________________________________________________    History of Present Illness:    Erik [name] is a 89 y.o. with a history of CAD s/p CABG (2001) and COPD seen in consultation at the request of Mabel Blunt after recent ED visit found to be in newly diagnosed atrial fibrillation.    Hospital Course: An 89 year old male with a history of triple coronary artery bypass grafting  and COPD presented after a nasal laceration from a shaver resulted in significant epistaxis, now resolved. He also reported recent episodes of elevated heart rate (up to 118 bpm) with a fluttering sensation but denied chest pain or ongoing shortness of breath. He has a history of COPD, previously prescribed oxygen  but not using it, and recently discontinued albuterol  due to concerns about tachycardia. On exam, his nasal bleeding had stopped and he was hemodynamically stable.     Second EKG shows atrial fibrillation with an incomplete right bundle branch block.  He is currently rate controlled. CHA2DS2-VASc score of 4, discussed case with cardiology on-call, Dr. Sedalia, plan currently is to discontinue his aspirin , initiate Eliquis , discharge to the outpatient atrial fibrillation clinic for rapid transition.  An appointment has been set for August 17, 2024 at 1 PM to be seen at outpatient patient A-fib transition clinic.  I have discussed all of these details with the patient and he is agreeable to plan.  At this time he is hemodynamically stable and no further workup will be pursued in the emergency department.  He is appropriate and safe for discharge at this time.    AFib-related symptoms: palpitations    The morning he presented to the ED for laceration, he noticed an erratic pulse, noting that he had an elevated pulse rate for several days prior. The patient denies current palpitations, PND, orthopnea, shortness of breath, chest pain, weakness, speech/visual changes, near-syncope/syncope, edema, or abnormal bleeding or bruising.    Date of AF diagnosis (approximate): 08/13/24  ER visits/hospitalizations for AF: one  Previous rhythm control strategies: N/A  Previous thrombosis events: N/A  Previous hemorrhagic events: N/A    ROS: See HPI. The balance of 10 reviewed systems is negative.     Past Medical History:  Past Medical History[1]    Past Surgical History:  Past Surgical History[2]      Current Medications:  Current Medications[3]      Social History:  Social History     Social History Narrative    Lives in [place], wife passed 7 years ago. His daughter lives with him. Granddaughter, Delon, manages his finances.        Allergies:  Patient has no known allergies.    Vitals:  Blood pressure 124/63, pulse 93, height 157.5 cm (5' 2), weight 60.7 kg (133 lb 12.8 oz), SpO2 97%.    Body mass index is 24.47 kg/m??.     18.5-24.9 kg/m2  Normal Weight    Physical Exam:   Exam: Pleasant, No apparent distress.   Neuro: Awake/alert, oriented x 4. CN II-XII grossly intact.   HEENT: normocephalic, atraumatic. CV S1 S2, irregularly irregular no MRG. No JVD or edema.   Lungs: CTAB. Good air movement.   Abd: soft, nontender. non distended.   Skin: warm, dry/intact, no breakdown, bleeding or bruising noted.    Lab Results:  Lab Results   Component Value Date    PRO-BNP 6,102.0 (H) 08/13/2024    Creatinine Whole Blood, POC 0.8 10/11/2017    Creatinine 1.21 (H) 08/13/2024    Creatinine 1.20 (H) 11/05/2023    Creatinine 0.68 (L) 01/12/2015    Creatinine 0.79 11/16/2013    BUN 14 08/13/2024    BUN 17 07/22/2023    BUN 10 01/12/2015    BUN 12 11/16/2013    Potassium 4.1 08/13/2024    Potassium 4.3 12/26/2022    Potassium 4.3 01/12/2015    Potassium 4.5 11/16/2013    Magnesium  1.8 08/13/2024  Magnesium  1.6 10/27/2021    AST 19 08/13/2024    AST 22 11/05/2023    ALT 14 08/13/2024    ALT 11 11/05/2023    TSH 2.463 08/13/2024    Total Bilirubin 0.7 08/13/2024    Total Bilirubin 0.5 12/26/2022    INR 1.07 08/13/2024    INR 0.86 12/15/2017    WBC 8.2 08/13/2024    HGB 11.8 (L) 08/13/2024    HCT 34.3 (L) 08/13/2024    Platelet 282 08/13/2024    Triglycerides 108 02/17/2023    Triglycerides 56 01/12/2015    HDL 60 (H) 01/12/2015    Cholesterol, HDL 53 02/17/2023    Cholesterol, Non-HDL, Calculated 86 02/17/2023    LDL Cholesterol, Calculated 53 01/12/2015    Cholesterol, LDL, Calculated 64 02/17/2023    CRP <4.0 12/28/2021       Imaging/Diagnostics:    ECHO:   TTE 12/12/2017  Technically difficult study due to chest wall/lung interference  Normal left ventricular systolic function, ejection fraction > 55%  Normal right ventricular systolic function    Heart monitor:    Catheterizations:     EP Procedures:      My initials indicate I have reviewed these quality measures: TSA  - If LVEF < 40%, patient should be prescribed beta-blocker and avoid diltaizem or verapamil.  - Discontinuation of antiplatelet therapy if no CAD or vascular disease.  - If presence of mechanical heart valve , use warfarin.  - Do not use dabigatran, rivaroxaban or edoxaban in end-stage renal disease or on dialysis.  - Patient participated in shared decision making with me in prescription for anticoagulation today.                 [1]   Past Medical History:  Diagnosis Date    Cataract 2005    cataract surgery    COPD (chronic obstructive pulmonary disease)     10/07/2016    DJD (degenerative joint disease), lumbar     Hypertension     Macular degeneration of right eye 10/17/2022    Mild anemia 01/15/2022    Tobacco abuse 02/15/2014   [2]   Past Surgical History:  Procedure Laterality Date    CARDIAC VALVE REPLACEMENT      triple bypass    CATARACT EXTRACTION      CORONARY ARTERY BYPASS GRAFT  2001    x3    EYE SURGERY  2005    cataracts    PR REPAIR ING HERNIA,5+Y/O,REDUCIBL Left 06/19/2015    Procedure: REPAIR INITIAL INGUINAL HERNIA, AGE 38 YEARS OR OLDER; REDUCIBLE;  Surgeon: Curtistine DELENA Poisson, MD;  [facility];  Service: Trauma    PR TEMPORAL ARTERY LIGATN OR BX Left 10/23/2017    Procedure: LIGATION OR BIOPSY, TEMPORAL ARTERY;  Surgeon: Fairy Lum Gaskins, MD;  Location: [facility];  Service: ENT    PR UPPER GI ENDOSCOPY,DIAGNOSIS N/A 12/17/2017    Procedure: UGI ENDO, INCLUDE ESOPHAGUS, STOMACH, & DUODENUM &/OR JEJUNUM; DX W/WO COLLECTION SPECIMN, BY BRUSH OR WASH;  Surgeon: Sean Easterly, MD;  [facility];  Service: Gastroenterology   [3]   Current Outpatient Medications   Medication Sig Dispense Refill    albuterol  HFA 90 mcg/actuation inhaler Inhale 2 puffs every six (6) hours as needed for wheezing. 24 g 3    amlodipine  (NORVASC ) 10 MG tablet Take 1 tablet by mouth once daily 90 tablet 0    atenolol  (TENORMIN ) 50 MG tablet Take 1  tablet by mouth twice daily 180 tablet 0    budesonide  (PULMICORT ) 0.5 mg/2 mL nebulizer solution Inhale 2 mL (0.5 mg total) by nebulization two (2) times a day. 120 mL 11    calcium -vitamin D 500 mg-5 mcg (200 unit) per tablet Take 1 tablet by mouth daily.      cyanocobalamin , vitamin B-12, 1000 MCG tablet Take 1 tablet (1,000 mcg total) by mouth daily.      furosemide  (LASIX ) 20 MG tablet TAKE 1/2 TO 1 (ONE-HALF TO ONE) TABLET BY MOUTH ONCE DAILY AS NEEDED FOR  SWELLING 90 tablet 0    GARLIC ORAL Take by mouth.      hydrALAZINE  (APRESOLINE ) 25 MG tablet Take 1 tablet by mouth once daily 90 tablet 0    ipratropium-albuterol  (DUO-NEB) 0.5-2.5 mg/3 mL nebulizer Inhale 3 mL by nebulization two (2) times a day. 3 mL 11    losartan  (COZAAR ) 100 MG tablet Take 1/2 (one-half) tablet by mouth once daily 45 tablet 0    mv-mins/folic/lycopene/ginkgo (ONE-A-DAY MEN'S 50+ ADVANTAGE ORAL) Take by mouth.      simvastatin  (ZOCOR ) 10 MG tablet Take 1 tablet by mouth once daily 90 tablet 0    tamsulosin  (FLOMAX ) 0.4 mg capsule Take 1 capsule by mouth once daily 90 capsule 0    apixaban  (ELIQUIS ) 2.5 mg Tab Take 1 tablet (2.5 mg total) by mouth two (2) times a day. 60 tablet 3    ascorbic acid (VITAMIN C) 500 MG tablet Take 1 tablet (500 mg total) by mouth daily.      calcium  carbonate (OS-CAL) 1,500 mg (600 mg elem calcium ) tablet Take 1 tablet (600 mg elem calcium  total) by mouth two (2) times a day.      metoPROLOL  tartrate (LOPRESSOR ) 25 MG tablet Take 1 tablet (25 mg total) by mouth Three (3) times a day as needed. For pulse >100bpm 90 tablet 6    oxyCODONE -acetaminophen  (PERCOCET ) 5-325 mg per tablet Take 1 tablet by mouth every six (6) hours as needed for pain. 120 tablet 0    tocilizumab  (ACTEMRA ) 162 mg/0.9 mL Syrg subcutaneous injection Inject 0.9 mL (162 mg total) under the skin every twenty-one (21) days. 5.4 mL 3     No current facility-administered medications for this visit.

## 2024-08-17 ENCOUNTER — Ambulatory Visit
Admit: 2024-08-17 | Discharge: 2024-08-18 | Payer: Medicare (Managed Care) | Attending: Adult Health | Primary: Adult Health

## 2024-08-17 DIAGNOSIS — Z0000 Encounter for general adult medical examination without abnormal findings: Principal | ICD-10-CM

## 2024-08-17 DIAGNOSIS — I4891 Unspecified atrial fibrillation: Principal | ICD-10-CM

## 2024-08-17 MED ORDER — METOPROLOL TARTRATE 25 MG TABLET
ORAL_TABLET | Freq: Three times a day (TID) | ORAL | 6 refills | 30.00000 days | Status: CP | PRN
Start: 2024-08-17 — End: 2025-09-21
  Filled 2024-08-17: qty 55, 19d supply, fill #0

## 2024-08-17 MED ORDER — AMLODIPINE 10 MG TABLET
ORAL_TABLET | Freq: Every day | ORAL | 0 refills | 90.00000 days | Status: CP
Start: 2024-08-17 — End: ?

## 2024-08-17 MED ORDER — APIXABAN 2.5 MG TABLET
ORAL_TABLET | Freq: Two times a day (BID) | ORAL | 3 refills | 30.00000 days | Status: CP
Start: 2024-08-17 — End: ?
  Filled 2024-08-17: qty 60, 30d supply, fill #0

## 2024-08-17 MED ORDER — FUROSEMIDE 20 MG TABLET
ORAL_TABLET | ORAL | 0 refills | 0.00000 days | Status: CP
Start: 2024-08-17 — End: ?

## 2024-08-17 NOTE — Unmapped (Signed)
 Patient is requesting the following refill  Requested Prescriptions     Pending Prescriptions Disp Refills    furosemide  (LASIX ) 20 MG tablet [Pharmacy Med Name: Furosemide  20 MG Oral Tablet] 90 tablet 0     Sig: TAKE 1/2 TO 1 (ONE-HALF TO ONE) TABLET BY MOUTH ONCE DAILY AS NEEDED FOR  SWELLING    amlodipine  (NORVASC ) 10 MG tablet [Pharmacy Med Name: amLODIPine  Besylate 10 MG Oral Tablet] 90 tablet 0     Sig: Take 1 tablet by mouth once daily       Recent Visits  Date Type Provider Dept   02/03/24 Office Visit Mangel, Benison Pap, DO [facility] [address] [facility]   Showing recent visits within past 365 days and meeting all other requirements  Future Appointments  Date Type Provider Dept   08/26/24 Appointment Mangel, Benison Pap, DO [facility] [address] [facility]   Showing future appointments within next 365 days and meeting all other requirements       Labs: Vitals:   BP Readings from Last 3 Encounters:   08/13/24 123/86   06/08/24 124/54   02/03/24 156/49    and   Pulse Readings from Last 3 Encounters:   08/13/24 112   02/03/24 52   11/05/23 64

## 2024-08-17 NOTE — Unmapped (Addendum)
 You are in atrial fibrillation today. I have ordered an echocardiogram and will get you set up for a cardioversion.     START metoprolol  tartrate 25mg  up to every 8 hours as needed for pulse > 100    START apixaban  2.5mg   BID    Thank you for allowing me to participate in your care.   Best way to contact me: MyChart messaging is the best way to reach me for non-urgent concerns.  For time-sensitive matters, please call the clinic at [phone].    Annabella Naval, AGNP-C  [facility]  _______________________________________  Important Phone Numbers:    [facility] Cardiology/Electrophysiology Clinic at [place]:   For general questions and clinic appointments: [phone]       For all questions about your Pacemaker, ICD, Implanted Device, or Remote monitor:   [facility] Electrophysiology Device Clinic: [phone]    FAX Forms and Paperwork: [phone]    After-hour urgent issues:      If you need after hours advice for urgent issues, call hospital operator [phone] & ask for the Cardiology Fellow On-Call.    Call 911 for emergencies._______________________________________    ICD Patients: If you have a debrillator/ICD and receive a SHOCK:   --If you get 1 shock from your ICD and you feel well, call the EP Device Clinic at [phone] (Monday - Friday and leave a message).    --If you get more than 1 shock OR you don???t feel well, go to nearest emergency room.    [facility] EP Patient Education [place]

## 2024-08-19 NOTE — Unmapped (Signed)
 Addended by: LEIGH RIGGS T on: 08/19/2024 02:29 PM     Modules accepted: Orders

## 2024-08-20 NOTE — Unmapped (Signed)
 Copied from CRM #[id number]. Topic: Access To Clinicians - Req Clinic Call Back  >> Aug 20, 2024  2:49 PM Almarie MATSU wrote:  Pt want int to speak with nurse

## 2024-08-20 NOTE — Unmapped (Signed)
 Returned call regarding to not being able to breathe. Patient states he can not breathe has been to the ED and does not want to go back advised that he should call 911 if he is having trouble breathing he refused. Forward the message to triage nurse.    Leighton Dage, CMA

## 2024-08-20 NOTE — Unmapped (Signed)
 Pt left VM 08/20/24 10:53 am requesting a call back from Annabella Naval, NP. Nurse return call to obtain more information, no answer, no VM set up.

## 2024-08-26 ENCOUNTER — Encounter
Admit: 2024-08-26 | Discharge: 2024-08-26 | Payer: Medicare (Managed Care) | Attending: Student in an Organized Health Care Education/Training Program | Primary: Student in an Organized Health Care Education/Training Program

## 2024-08-26 DIAGNOSIS — I4891 Unspecified atrial fibrillation: Principal | ICD-10-CM

## 2024-08-26 DIAGNOSIS — D649 Anemia, unspecified: Principal | ICD-10-CM

## 2024-08-26 DIAGNOSIS — J449 Chronic obstructive pulmonary disease, unspecified: Principal | ICD-10-CM

## 2024-08-26 DIAGNOSIS — M7989 Other specified soft tissue disorders: Principal | ICD-10-CM

## 2024-08-26 DIAGNOSIS — I10 Essential (primary) hypertension: Principal | ICD-10-CM

## 2024-08-26 DIAGNOSIS — F17200 Nicotine dependence, unspecified, uncomplicated: Principal | ICD-10-CM

## 2024-08-26 DIAGNOSIS — M316 Other giant cell arteritis: Principal | ICD-10-CM

## 2024-08-26 DIAGNOSIS — G894 Chronic pain syndrome: Principal | ICD-10-CM

## 2024-08-26 DIAGNOSIS — R0600 Dyspnea, unspecified: Principal | ICD-10-CM

## 2024-08-26 DIAGNOSIS — I25810 Atherosclerosis of coronary artery bypass graft(s) without angina pectoris: Principal | ICD-10-CM

## 2024-08-26 DIAGNOSIS — N400 Enlarged prostate without lower urinary tract symptoms: Principal | ICD-10-CM

## 2024-08-26 DIAGNOSIS — J329 Chronic sinusitis, unspecified: Principal | ICD-10-CM

## 2024-08-26 DIAGNOSIS — R7989 Other specified abnormal findings of blood chemistry: Principal | ICD-10-CM

## 2024-08-26 DIAGNOSIS — J441 Chronic obstructive pulmonary disease with (acute) exacerbation: Principal | ICD-10-CM

## 2024-08-26 LAB — CBC W/ AUTO DIFF
BASOPHILS ABSOLUTE COUNT: 0 10*9/L (ref 0.0–0.1)
BASOPHILS RELATIVE PERCENT: 0.3 %
EOSINOPHILS ABSOLUTE COUNT: 0.1 10*9/L (ref 0.0–0.5)
EOSINOPHILS RELATIVE PERCENT: 1.4 %
HEMATOCRIT: 32.2 % — ABNORMAL LOW (ref 39.0–48.0)
HEMOGLOBIN: 11 g/dL — ABNORMAL LOW (ref 12.9–16.5)
LYMPHOCYTES ABSOLUTE COUNT: 1.4 10*9/L (ref 1.1–3.6)
LYMPHOCYTES RELATIVE PERCENT: 14.1 %
MEAN CORPUSCULAR HEMOGLOBIN CONC: 34.2 g/dL (ref 32.0–36.0)
MEAN CORPUSCULAR HEMOGLOBIN: 33.7 pg — ABNORMAL HIGH (ref 25.9–32.4)
MEAN CORPUSCULAR VOLUME: 98.6 fL — ABNORMAL HIGH (ref 77.6–95.7)
MEAN PLATELET VOLUME: 9 fL (ref 6.8–10.7)
MONOCYTES ABSOLUTE COUNT: 1 10*9/L — ABNORMAL HIGH (ref 0.3–0.8)
MONOCYTES RELATIVE PERCENT: 9.8 %
NEUTROPHILS ABSOLUTE COUNT: 7.6 10*9/L (ref 1.8–7.8)
NEUTROPHILS RELATIVE PERCENT: 74.4 %
PLATELET COUNT: 218 10*9/L (ref 150–450)
RED BLOOD CELL COUNT: 3.26 10*12/L — ABNORMAL LOW (ref 4.26–5.60)
RED CELL DISTRIBUTION WIDTH: 14.5 % (ref 12.2–15.2)
WBC ADJUSTED: 10.2 10*9/L (ref 3.6–11.2)

## 2024-08-26 LAB — BASIC METABOLIC PANEL
ANION GAP: 13 mmol/L (ref 5–14)
BLOOD UREA NITROGEN: 17 mg/dL (ref 9–23)
BUN / CREAT RATIO: 12
CALCIUM: 9.4 mg/dL (ref 8.7–10.4)
CHLORIDE: 100 mmol/L (ref 98–107)
CO2: 26 mmol/L (ref 20.0–31.0)
CREATININE: 1.37 mg/dL — ABNORMAL HIGH (ref 0.73–1.18)
EGFR CKD-EPI (2021) MALE: 50 mL/min/1.73m2 — ABNORMAL LOW (ref >=60)
GLUCOSE RANDOM: 163 mg/dL (ref 70–179)
POTASSIUM: 4.2 mmol/L (ref 3.4–4.8)
SODIUM: 139 mmol/L (ref 135–145)

## 2024-08-26 MED ORDER — ATENOLOL 50 MG TABLET
ORAL_TABLET | Freq: Every day | ORAL | 0 refills | 180.00000 days | Status: CP
Start: 2024-08-26 — End: ?

## 2024-08-26 MED ORDER — AMOXICILLIN 875 MG-POTASSIUM CLAVULANATE 125 MG TABLET
ORAL_TABLET | Freq: Two times a day (BID) | ORAL | 0 refills | 7.00000 days | Status: CP
Start: 2024-08-26 — End: 2024-09-02

## 2024-08-26 MED ORDER — LEVALBUTEROL 0.63 MG/3 ML SOLUTION FOR NEBULIZATION
Freq: Four times a day (QID) | RESPIRATORY_TRACT | 2 refills | 1.00000 days | Status: CP | PRN
Start: 2024-08-26 — End: 2024-08-26

## 2024-08-26 NOTE — Unmapped (Signed)
-  Additional medical history includes:   Optic Neuropathy  Exudative age-related macular degeneration of right eye, unspecified stage (CMS-HCC),   Left eye vision loss  Long-term current use of tocilizumab    --> Next Rheumatology visit on 11/03/2024

## 2024-08-26 NOTE — Unmapped (Signed)
 DDD  -He has been on opioids chronically. Currently taking Percocet  5mg  every 6 hours   -Concern that this can cause decrease in respiratory drive/respiratory  depression. However he does not wish to make any adjustments to his pain medication  -He needs updated UDS and Annual Opioid/Controlled substance agreement

## 2024-08-26 NOTE — Unmapped (Addendum)
-  He is experiencing worsening respiratory symptoms and believes it is due to a possible Ear, Sinus infection. We discussed in depth that at this time, his symptoms do not appear consistent with a bacterial infection but I am concerned his COPD has gotten worse  -He was very persistent on receiving an abx.     -Prescribed course of Augmentin  due to patient's request  -We DEFERRED steroids at this time as could worsen tachycardia    -Advised to STOP ALBUTEROL  and will start Xopenex  PRN   -He is to still use Budesonide  Nebullizer BID    -I will refer to Pulmonology given worsening respiratory status and inability to prescribe LAMA or LAMA/LABA that is less than $300  -Additionally he continues to use Home O2 intermittently       Orders:    Pulmonology; Future    amoxicillin -clavulanate (AUGMENTIN ) 875-125 mg per tablet; Take 1 tablet by mouth two (2) times a day for 7 days.    levalbuterol  (XOPENEX ) 0.63 mg/3 mL nebulizer solution; Inhale 3 mL (0.63 mg total) by nebulization every six (6) hours as needed for wheezing or shortness of breath.    -We did review his CXR from recent ER visit as well   -He should present to the emergency room if he has acute dyspnea for urgent/emergent evaluation. This has been discussed previously with him

## 2024-08-26 NOTE — Unmapped (Addendum)
-  He has been following with cardiology previously.   -He had an ER visit on 08/13/2024 for palpitations. EKG in the ER had refilled A. Fib with incomplete RBBB. He was advised to stop aspirin  and start Eliquis .   -He was evaluated by Cardiology on 08/17/2024 where the following was noted:   Discussed option for rhythm control with DCCV given this is his first presentation of A-fib either with TEE or waiting 3 weeks after starting anticoagulation.  At this point he is feeling okay and would prefer to avoid TEE.  Will arrange for DCCV after patient has been on apixaban  for 3 weeks. Will keep us  updated and can arrange for TEE if he becomes more symptomatic or heart rate difficult to control.     -Of note, Target HR <100.  -Currently taking:   -ACEi/ARB--> Losartan  100mg  every day    -BB--> Metoprolol  Tartrate 25mg  TID PRN, Atenolol  50mg  every day    -Anti-coagulation --> Eliquis  2.5mg  every day    -Diuretic --> Furosemide  1/2 to 1 tablet PRN daily for leg swelling   -Lipid lowering agents-->Simvastatin  10mg  every day    -Additional medications: Amlodipine  10mg  every day, Hydralazine  25mg  every day      -Will need to consider stopping amlodipine  as could be contributing to his edema  -To see Cardiology on 09/08/2024       Orders:    atenolol  (TENORMIN ) 50 MG tablet; Take 1 tablet (50 mg total) by mouth daily.

## 2024-08-26 NOTE — Unmapped (Signed)
 Patient ID: Erik [name], date of birth [birth year] is a 89 y.o. male.    Date: August 26, 2024    Assessment & Plan        [name] is a 89 y.o. male presenting for follow up   -he presents with his granddaughter today    Assessment & Plan  Atrial fibrillation, unspecified type    (CMS-HCC)  Coronary artery disease involving autologous vein coronary bypass graft without angina pectoris  Essential hypertension, benign  -He has been following with cardiology previously.   -He had an ER visit on 08/13/2024 for palpitations. EKG in the ER had refilled A. Fib with incomplete RBBB. He was advised to stop aspirin  and start Eliquis .   -He was evaluated by Cardiology on 08/17/2024 where the following was noted:   Discussed option for rhythm control with DCCV given this is his first presentation of A-fib either with TEE or waiting 3 weeks after starting anticoagulation.  At this point he is feeling okay and would prefer to avoid TEE.  Will arrange for DCCV after patient has been on apixaban  for 3 weeks. Will keep us  updated and can arrange for TEE if he becomes more symptomatic or heart rate difficult to control.     -Of note, Target HR <100.  -Currently taking:   -ACEi/ARB--> Losartan  100mg  every day    -BB--> Metoprolol  Tartrate 25mg  TID PRN, Atenolol  50mg  every day    -Anti-coagulation --> Eliquis  2.5mg  every day    -Diuretic --> Furosemide  1/2 to 1 tablet PRN daily for leg swelling   -Lipid lowering agents-->Simvastatin  10mg  every day    -Additional medications: Amlodipine  10mg  every day, Hydralazine  25mg  every day      -Will need to consider stopping amlodipine  as could be contributing to his edema  -To see Cardiology on 09/08/2024       Orders:    atenolol  (TENORMIN ) 50 MG tablet; Take 1 tablet (50 mg total) by mouth daily.    Chronic obstructive pulmonary disease, unspecified COPD type    (CMS-HCC)  Tobacco use disorder  Dyspnea, unspecified type  COPD exacerbation    (CMS-HCC)    -He is experiencing worsening respiratory symptoms and believes it is due to a possible Ear, Sinus infection. We discussed in depth that at this time, his symptoms do not appear consistent with a bacterial infection but I am concerned his COPD has gotten worse  -He was very persistent on receiving an abx.     -Prescribed course of Augmentin  due to patient's request  -We DEFERRED steroids at this time as could worsen tachycardia    -Advised to STOP ALBUTEROL  and will start Xopenex  PRN   -He is to still use Budesonide  Nebullizer BID    -I will refer to Pulmonology given worsening respiratory status and inability to prescribe LAMA or LAMA/LABA that is less than $300  -Additionally he continues to use Home O2 intermittently       Orders:    Pulmonology; Future    amoxicillin -clavulanate (AUGMENTIN ) 875-125 mg per tablet; Take 1 tablet by mouth two (2) times a day for 7 days.    levalbuterol  (XOPENEX ) 0.63 mg/3 mL nebulizer solution; Inhale 3 mL (0.63 mg total) by nebulization every six (6) hours as needed for wheezing or shortness of breath.    -We did review his CXR from recent ER visit as well   -He should present to the emergency room if he has acute dyspnea for urgent/emergent evaluation. This has been  discussed previously with him      Rhinosinusitis  -per patient request, will prescribe abx below  -encouraged to use OTC nasal spray and allergy medication to help with PND.   Orders:    amoxicillin -clavulanate (AUGMENTIN ) 875-125 mg per tablet; Take 1 tablet by mouth two (2) times a day for 7 days.    Elevated serum creatinine  Normocytic anemia  -Rechecking labs below    Orders:    Basic Metabolic Panel    CBC w/ Differential    Left leg swelling  -He reports having worsening LLE edema since starting eliquis  and metoprolol    -STAT Venous Duplex to rule out DVT  Orders:    [facility] Venous Duplex Lower Extremity Left; Future    Giant cell arteritis    (CMS-HCC)  -Additional medical history includes:   Optic Neuropathy  Exudative age-related macular degeneration of right eye, unspecified stage (CMS-HCC),   Left eye vision loss  Long-term current use of tocilizumab    --> Next Rheumatology visit on 11/03/2024     Chronic pain syndrome  DDD  -He has been on opioids chronically. Currently taking Percocet  5mg  every 6 hours   -Concern that this can cause decrease in respiratory drive/respiratory  depression. However he does not wish to make any adjustments to his pain medication  -He needs updated UDS and Annual Opioid/Controlled substance agreement       Benign prostatic hyperplasia, unspecified whether lower urinary tract symptoms present   Continue Tamsulosin  0.4mg             Return in about 4 weeks (around 09/23/2024) for Recheck.    I personally spent >50 minutes face-to-face and non-face-to-face in the care of this patient, which includes all pre, intra, and post visit time on the date of service.  All documented time was specific to the E/M visit and does not include any procedures that may have been performed.        Chief Complaint:   Chief Complaint   Patient presents with    Follow-up         History of Present Illness  Erik [name] is an 89 year old male with COPD who presents with worsening shortness of breath and phlegm production.    He has been experiencing significant shortness of breath for the past couple of weeks, particularly when walking from his bedroom to the bathroom. He feels 'gasping for air' upon reaching the bathroom and needs to sit down to recover. This breathlessness also affects his ability to shower, as he needs to sit down after completing the task.    He has phlegm that 'comes down and gets in my throat' and coughs up phlegm during the night. He experiences postnasal drip, which he believes contributes to his symptoms. No fevers are present.    He reports ear pain when pressing on his ear and notes a swollen gland.    He is concerned about the cost of inhalers, noting a previous prescription was $57, which he found too expensive. He uses a nebulizer with albuterol , which he believes has increased his heart rate, noting a pulse rate of 125 at home. He also uses budesonide  in his nebulizer.    He lives alone and has not been around anyone who is sick. He is blind in one eye and is losing sight in the other, expressing concern about becoming completely blind.      Allergies:   Allergies as of 08/26/2024    (No Known Allergies)  Problem List: Problem List[1]    The following information was reviewed by members of the visit team:  Allergies - Medications -          Vitals:    08/26/24 1114   BP: 107/62   Pulse: 117   Temp: 36.3 ??C (97.3 ??F)   SpO2: 95%   Weight: 62.6 kg (138 lb)     Body mass index is 25.24 kg/m??.    Wt Readings from Last 3 Encounters:   08/26/24 62.6 kg (138 lb)   08/17/24 60.7 kg (133 lb 12.8 oz)   06/08/24 60.8 kg (134 lb)       ROS: ROS negative unless otherwise noted in HPI.    EXAM:   Physical Exam  Constitutional:       General: He is not in acute distress.     Appearance: He is not ill-appearing, toxic-appearing or diaphoretic.   HENT:      Head: Normocephalic and atraumatic.      Right Ear: Tympanic membrane and external ear normal. There is no impacted cerumen.      Left Ear: Tympanic membrane and external ear normal. There is no impacted cerumen.      Mouth/Throat:      Mouth: Mucous membranes are moist.      Pharynx: No oropharyngeal exudate.   Eyes:      Extraocular Movements: Extraocular movements intact.      Conjunctiva/sclera: Conjunctivae normal.   Cardiovascular:      Rate and Rhythm: Tachycardia present.   Pulmonary:      Comments: Mild conversational dyspnea present. He has poor air movement but I do not appreciate cough or rales. He is not hypoxic or have increased work of breathing  Musculoskeletal:      Right lower leg: Edema present.      Left lower leg: Edema present.      Comments: He does have bilateral leg swelling (L>R). No erythema or warmth to lower extremities but reports TTP of left leg compared to right   Neurological:      General: No focal deficit present.      Mental Status: He is alert and oriented to person, place, and time.      Cranial Nerves: No cranial nerve deficit.   Psychiatric:      Comments: irritable                  [1]   Patient Active Problem List  Diagnosis    Essential hypertension, benign    Chronic back pain    CAD (coronary artery disease), autologous vein bypass graft    Right carotid bruit    Tobacco use disorder    Chronic pain syndrome    COPD exacerbation    (CMS-HCC)    Risk for falls    Vision, loss, sudden, left    Giant cell arteritis    (CMS-HCC)    Duodenitis    Melena    Constipation    DDD (degenerative disc disease), lumbar    Dyslipidemia    Macrocytosis without anemia    BPH (benign prostatic hyperplasia)    COPD (chronic obstructive pulmonary disease)    (CMS-HCC)    Mild anemia    Exudative age-related macular degeneration of right eye, unspecified stage    (CMS-HCC)    Atrial fibrillation    (CMS-HCC)

## 2024-08-26 NOTE — Unmapped (Signed)
Continue Tamsulosin 0.4 mg

## 2024-08-26 NOTE — Unmapped (Addendum)
 STOP ALBUTEROL      START XOPENEX  nebulizer:  0.63 mg 3 times daily at intervals of 6 to 8 hours as needed    Will prescribe short course of AUGMENTIN      Referral to PULMONOLOGY

## 2024-08-27 ENCOUNTER — Inpatient Hospital Stay: Admit: 2024-08-27 | Discharge: 2024-08-27 | Payer: Medicare (Managed Care)

## 2024-08-27 DIAGNOSIS — I70209 Unspecified atherosclerosis of native arteries of extremities, unspecified extremity: Principal | ICD-10-CM

## 2024-08-27 DIAGNOSIS — R7989 Other specified abnormal findings of blood chemistry: Principal | ICD-10-CM

## 2024-08-27 DIAGNOSIS — R1909 Other intra-abdominal and pelvic swelling, mass and lump: Principal | ICD-10-CM

## 2024-08-27 DIAGNOSIS — M7989 Other specified soft tissue disorders: Principal | ICD-10-CM

## 2024-08-27 NOTE — Unmapped (Signed)
 Please contact this patient with the following:     Your labs showed that your kidney function is still down a little from before. Your blood counts dropped just a little bit. However I am somewhat concerned regarding your kidney function  -I would like to repeat his kidney function next week--please schedule a lab visit    -Regarding the ultrasound of his leg, it did not show a clot BUT did show a large structure in his groin. I would like to get a CT scan of this however this will have to be without contrast due to his kidney function    -There is also narrowing of the arteries in his legs.   I would like to obtain an ultrasound of the ARTERIES in his legs    -I will be placing these orders as STAT as you are having symptoms

## 2024-08-27 NOTE — Unmapped (Signed)
 I did contact this patient regarding his labs and imaging. He confirmed his identity before discussing.   He stated he didn't have time to read anything because he just got done    Reviewed [facility] Message which noted: Last read by Tanda LITTIE Odea at 1:33PM on 08/27/2024.     I advised that his US  results did not show DVT but did show a mass in his right groin and stenosis in his femoral artery     He states that the little puffiness there has been there for I don't know how long    He states that he does not have transportation to do this and that he might as well just die then do all of this    He states that this morning at 0400 he couldn't breathe. I stated that he should have gone to the emergency room then. He stated LISTEN and that the new medication did help and went to his ultrasound at 0700    I advised that if he can not breath, he should go to the emergency room. He states that the emergency doesn't do nothing and took chest xrays and they said you're done and left all the attachments to him      He states that he can not keep doing this because of lack of transportation.     I verbalized understanding to concern of transportation. However I am not able to predict these findings and that I recommended further imaging to ensure we are not missing anything.     He then talked about the transportation concern again and then stated 'I've really had it. Bye and then hung up the phone.

## 2024-08-30 NOTE — Unmapped (Signed)
 Addended by: LEIGH RIGGS T on: 08/30/2024 09:11 AM     Modules accepted: Orders

## 2024-09-01 NOTE — Unmapped (Signed)
 Verified DCCV procedure date of 9/25 at the [facility] location with Granddaughter. Let her know that OR would be calling her the day before the procedure with pre procedure instructions and arrival time. She was agreeable.

## 2024-09-01 NOTE — Unmapped (Signed)
 [facility] office is having an Issue - tried to reach out for ultrasound but he gets outrageous with them when they to schedule. Pt thinks he has already had done what they are trying to schedule. Damien said she isn't going to call back until PCP calls him to let him know he needs to get this done. If any questions please call Damien at [facility] office.

## 2024-09-03 NOTE — Unmapped (Signed)
 Called patient to let him know what the US  was for per Dr Keven     Leighton Dage, CMA

## 2024-09-03 NOTE — Unmapped (Signed)
 I have already contact him last week. Please contact him to let him know that the ultrasound is of the arteries of his legs

## 2024-09-04 ENCOUNTER — Ambulatory Visit: Admit: 2024-09-04 | Discharge: 2024-09-10 | Payer: Medicare (Managed Care)

## 2024-09-04 ENCOUNTER — Encounter
Admit: 2024-09-04 | Discharge: 2024-09-10 | Disposition: A | Discharge status: Home Health | Payer: Medicare (Managed Care) | Attending: Anesthesiology | Admitting: Family Medicine

## 2024-09-04 ENCOUNTER — Inpatient Hospital Stay
Admit: 2024-09-04 | Discharge: 2024-09-10 | Disposition: A | Discharge status: Home Health | Payer: Medicare (Managed Care) | Admitting: Family Medicine

## 2024-09-04 LAB — COMPREHENSIVE METABOLIC PANEL
ALBUMIN: 3.4 g/dL (ref 3.4–5.0)
ALKALINE PHOSPHATASE: 44 U/L — ABNORMAL LOW (ref 46–116)
ALT (SGPT): 16 U/L (ref 10–49)
ANION GAP: 15 mmol/L — ABNORMAL HIGH (ref 5–14)
AST (SGOT): 39 U/L — ABNORMAL HIGH (ref <=34)
BILIRUBIN TOTAL: 0.7 mg/dL (ref 0.3–1.2)
CALCIUM: 8.5 mg/dL — ABNORMAL LOW (ref 8.7–10.4)
CHLORIDE: 102 mmol/L (ref 98–107)
CO2: 19.4 mmol/L — ABNORMAL LOW (ref 20.0–31.0)
GLUCOSE RANDOM: 123 mg/dL (ref 70–179)
PROTEIN TOTAL: 5.9 g/dL (ref 5.7–8.2)
SODIUM: 136 mmol/L (ref 135–145)

## 2024-09-04 LAB — CBC W/ AUTO DIFF
BASOPHILS ABSOLUTE COUNT: 0 10*9/L (ref 0.0–0.1)
BASOPHILS RELATIVE PERCENT: 0.8 %
EOSINOPHILS ABSOLUTE COUNT: 0.1 10*9/L (ref 0.0–0.5)
EOSINOPHILS RELATIVE PERCENT: 1.3 %
HEMATOCRIT: 31.5 % — ABNORMAL LOW (ref 39.0–48.0)
HEMOGLOBIN: 10.8 g/dL — ABNORMAL LOW (ref 12.9–16.5)
LYMPHOCYTES ABSOLUTE COUNT: 1 10*9/L — ABNORMAL LOW (ref 1.1–3.6)
LYMPHOCYTES RELATIVE PERCENT: 17.7 %
MEAN CORPUSCULAR HEMOGLOBIN CONC: 34.3 g/dL (ref 32.0–36.0)
MEAN CORPUSCULAR HEMOGLOBIN: 33.7 pg — ABNORMAL HIGH (ref 25.9–32.4)
MEAN CORPUSCULAR VOLUME: 98.4 fL — ABNORMAL HIGH (ref 77.6–95.7)
MEAN PLATELET VOLUME: 7.8 fL (ref 6.8–10.7)
MONOCYTES ABSOLUTE COUNT: 0.5 10*9/L (ref 0.3–0.8)
MONOCYTES RELATIVE PERCENT: 7.6 %
NEUTROPHILS ABSOLUTE COUNT: 4.3 10*9/L (ref 1.8–7.8)
NEUTROPHILS RELATIVE PERCENT: 72.6 %
NUCLEATED RED BLOOD CELLS: 0 /100{WBCs} (ref <=4)
PLATELET COUNT: 205 10*9/L (ref 150–450)
RED BLOOD CELL COUNT: 3.2 10*12/L — ABNORMAL LOW (ref 4.26–5.60)
RED CELL DISTRIBUTION WIDTH: 14.5 % (ref 12.2–15.2)
WBC ADJUSTED: 5.9 10*9/L (ref 3.6–11.2)

## 2024-09-04 LAB — BLOOD GAS, VENOUS
BASE EXCESS VENOUS: -2.5 — ABNORMAL LOW (ref -2.0–2.0)
BASE EXCESS VENOUS: -5 — ABNORMAL LOW (ref -2.0–2.0)
FIO2 VENOUS: 45
HCO3 VENOUS: 22 mmol/L (ref 22–27)
HCO3 VENOUS: 20 mmol/L — ABNORMAL LOW (ref 22–27)
O2 SATURATION VENOUS: 67.7 % (ref 40.0–85.0)
O2 SATURATION VENOUS: 75.8 % (ref 40.0–85.0)
PCO2 VENOUS: 40 mmHg (ref 40–60)
PCO2 VENOUS: 37 mmHg — ABNORMAL LOW (ref 40–60)
PH VENOUS: 7.36 (ref 7.32–7.43)
PH VENOUS: 7.36 (ref 7.32–7.43)
PO2 VENOUS: 37 mmHg (ref 35–40)
PO2 VENOUS: 42 mmHg — ABNORMAL HIGH (ref 35–40)

## 2024-09-04 LAB — BASIC METABOLIC PANEL
ANION GAP: 17 mmol/L — ABNORMAL HIGH (ref 5–14)
BLOOD UREA NITROGEN: 12 mg/dL (ref 9–23)
BUN / CREAT RATIO: 10
CALCIUM: 8.9 mg/dL (ref 8.7–10.4)
CHLORIDE: 100 mmol/L (ref 98–107)
CO2: 19.4 mmol/L — ABNORMAL LOW (ref 20.0–31.0)
CREATININE: 1.17 mg/dL (ref 0.73–1.18)
EGFR CKD-EPI (2021) MALE: 60 mL/min/1.73m2 (ref >=60)
GLUCOSE RANDOM: 128 mg/dL (ref 70–179)
POTASSIUM: 4.3 mmol/L (ref 3.4–4.8)
SODIUM: 136 mmol/L (ref 135–145)

## 2024-09-04 LAB — HIGH SENSITIVITY TROPONIN I - 6 HOUR SERIAL
HIGH SENSITIVITY TROPONIN - DELTA (2-6H): 1 ng/L (ref <=7)
HIGH-SENSITIVITY TROPONIN I - 6 HOUR: 15 ng/L (ref <=53)

## 2024-09-04 LAB — MAGNESIUM: MAGNESIUM: 1.9 mg/dL (ref 1.6–2.6)

## 2024-09-04 LAB — HIGH SENSITIVITY TROPONIN I - 2 HOUR SERIAL
HIGH SENSITIVITY TROPONIN - DELTA (0-2H): 1 ng/L (ref <=7)
HIGH-SENSITIVITY TROPONIN I - 2 HOUR: 14 ng/L (ref <=53)

## 2024-09-04 LAB — HIGH SENSITIVITY TROPONIN I - SERIAL: HIGH SENSITIVITY TROPONIN I: 13 ng/L (ref <=53)

## 2024-09-04 LAB — PRO-BNP: PRO-BNP: 3415 pg/mL — ABNORMAL HIGH (ref <=300.0)

## 2024-09-04 LAB — PHOSPHORUS: PHOSPHORUS: 2.4 mg/dL (ref 2.4–5.1)

## 2024-09-04 LAB — LACTATE, VENOUS, WHOLE BLOOD: LACTATE BLOOD VENOUS: 2.5 mmol/L — ABNORMAL HIGH (ref 0.5–1.8)

## 2024-09-04 MED ADMIN — ipratropium (ATROVENT) 0.02 % nebulizer solution 500 mcg: 500 ug | RESPIRATORY_TRACT | @ 20:00:00

## 2024-09-04 MED ADMIN — levalbuterol (XOPENEX) nebulizer solution 0.63 mg: .63 mg | RESPIRATORY_TRACT | @ 18:00:00 | Stop: 2024-09-06

## 2024-09-04 MED ADMIN — cyanocobalamin (vitamin B-12) tablet 1,000 mcg: 1000 ug | ORAL | @ 18:00:00

## 2024-09-04 MED ADMIN — levalbuterol (XOPENEX) nebulizer solution 1.25 mg: 1.25 mg | RESPIRATORY_TRACT | @ 14:00:00 | Stop: 2024-09-04

## 2024-09-04 MED ADMIN — calcium carbonate tablet 600 mg elem calcium: 600 mg | ORAL | @ 18:00:00

## 2024-09-04 MED ADMIN — methylPREDNISolone sodium succinate (SOLU-Medrol) injection 125 mg: 125 mg | INTRAVENOUS | @ 14:00:00 | Stop: 2024-09-04

## 2024-09-04 MED ADMIN — budesonide (PULMICORT) nebulizer solution 0.5 mg: .5 mg | RESPIRATORY_TRACT | @ 18:00:00

## 2024-09-04 MED ADMIN — levalbuterol (XOPENEX) nebulizer solution 0.63 mg: .63 mg | RESPIRATORY_TRACT | @ 21:00:00 | Stop: 2024-09-06

## 2024-09-04 MED ADMIN — furosemide (LASIX) injection 20 mg: 20 mg | INTRAVENOUS | @ 15:00:00 | Stop: 2024-09-04

## 2024-09-04 MED ADMIN — ipratropium (ATROVENT) 0.02 % nebulizer solution 500 mcg: 500 ug | RESPIRATORY_TRACT | @ 18:00:00

## 2024-09-04 MED ADMIN — apixaban (ELIQUIS) tablet 2.5 mg: 2.5 mg | ORAL | @ 18:00:00

## 2024-09-04 MED ADMIN — oxyCODONE-acetaminophen (PERCOCET) 5-325 mg tablet 1 tablet: 1 | ORAL | @ 18:00:00 | Stop: 2024-09-04

## 2024-09-04 MED ADMIN — ascorbic acid (vitamin C) (VITAMIN C) tablet 500 mg: 500 mg | ORAL | @ 18:00:00

## 2024-09-04 MED ADMIN — tamsulosin (FLOMAX) 24 hr capsule 0.4 mg: .4 mg | ORAL | @ 18:00:00

## 2024-09-04 MED ADMIN — metoPROLOL tartrate (Lopressor) tablet 12.5 mg: 12.5 mg | ORAL | @ 21:00:00

## 2024-09-04 NOTE — Unmapped (Signed)
 Pt is coming in with c/o having an asthma attach today. Pt with 1 albuterol  neb from home an a duo neb from EMS. Had IV placed but no further meds given.

## 2024-09-04 NOTE — Unmapped (Signed)
 [facility]  Emergency Department Provider Note          ED Clinical Impression      Final diagnoses:   Acute respiratory failure with hypoxia    (CMS-HCC) (Primary)            Impression, Medical Decision Making, Progress Notes and Critical Care      Impression, Differential Diagnosis and Plan of Care    Erik [name] is a 89 y.o. male with a past medical history of HTN, COPD, CAD (s/p CABG 2001), A-Fib (on Eliquis ), tobacco use disorder, chronic pain syndrome, and anemia  who presents for evaluation of shortness of breath and productive cough 2/2 foreign body sensation in the throat which onset after swallowing a pill yesterday.    On exam, patient is ill appearing, and in no acute distress. Vitals are remarkable for hypoxia to 84% and tachycardia to 104 but are otherwise within normal limits; patient is hemodynamically stable, afebrile. Physical exam notable for 2+ pitting edema of the legs to the level of the knees and 1+ pitting edema of the forearms.    Differential diagnosis includes COPD exacerbation vs acute decompensated CHF.  He does report doing his normal 1/2 tablet lasix  daily.  Obstruction also in differential given reported coughing episode while taking his one a day tablet.      Plan to obtain basic labs, Troponin, pro-BNP, magnesium , phosphorous, and CXR. Will treat patient with SoluMedrol and Xopenex  nebulizer.  Given hypoxic respiratory failure on 6lpm [place] will place on Bipap.    Additional Progress Notes    Patients respiratory distress much improved on Bipap with good oxygenation on 45% FiO2.  Labwork demonstrates elevated BNP >3000.  Will give 20mg  IV lasix  and admit to medicine for hypoxic respiratory failure.      10:41 AM MAO paged for admission 2/2 concern for heart failure exacerbation.           Portions of this record have been created using Scientist, clinical (histocompatibility and immunogenetics). Dictation errors have been sought, but may not have been identified and corrected.    See chart and resident provider documentation for details.    ____________________________________________         History        Reason for Visit  Shortness of Breath      HPI   Erik [name] is a 89 y.o. male with a past medical history of HTN, COPD, CAD (s/p CABG 2001), A-Fib (on Eliquis ), tobacco use disorder, chronic pain syndrome, and anemia presenting with shortness of breath. The patient reports shortness of breath which onset yesterday after taking a daily medication and subsequently feeling a foreign body sensation in his throat. He expresses concern that the pill has become stuck in his throat. The patient endorses productive cough of phlegm in efforts to dislodge the pill from his throat, however, has experienced increased shortness of breath such that he has been gasping for air. Of note, he uses nebulizer treatments for breathing.    Per chart review: The patient was seen at this ED on 08/22 for palpitations and irregular heartbeat. He noted that he had recently discontinued use of Albuterol  2/2 concerns about tachycardia during this visit. Second EKG done on this day showed new A-Fib with an incomplete RBBB. At the time of discharge, he was taken off of Aspirin , initiated with Eliquis , and set up with outpatient A-fib clinic follow up. He was advised to stop use of Albuterol  and start Xopenex   PRN during his PCP visit on 09/04.     Outside Historian(s)  None    External Records Reviewed  08/26/24 [facility] note for PMH    Past Medical History[1]    Problem List[2]    Past Surgical History[3]      Current Facility-Administered Medications:     furosemide  (LASIX ) injection 20 mg, 20 mg, Intravenous, Once, Claudean, Fairy Lame, MD    Current Outpatient Medications:     amlodipine  (NORVASC ) 10 MG tablet, Take 1 tablet by mouth once daily, Disp: 90 tablet, Rfl: 0    apixaban  (ELIQUIS ) 2.5 mg Tab, Take 1 tablet (2.5 mg total) by mouth two (2) times a day., Disp: 60 tablet, Rfl: 3    ascorbic acid  (VITAMIN C ) 500 MG tablet, Take 1 tablet (500 mg total) by mouth daily., Disp: , Rfl:     atenolol  (TENORMIN ) 50 MG tablet, Take 1 tablet (50 mg total) by mouth daily., Disp: 180 tablet, Rfl: 0    budesonide  (PULMICORT ) 0.5 mg/2 mL nebulizer solution, Inhale 2 mL (0.5 mg total) by nebulization two (2) times a day., Disp: 120 mL, Rfl: 11    calcium  carbonate (OS-CAL) 1,500 mg (600 mg elem calcium ) tablet, Take 1 tablet (600 mg elem calcium  total) by mouth two (2) times a day., Disp: , Rfl:     calcium -vitamin D 500 mg-5 mcg (200 unit) per tablet, Take 1 tablet by mouth daily., Disp: , Rfl:     cyanocobalamin , vitamin B-12, 1000 MCG tablet, Take 1 tablet (1,000 mcg total) by mouth daily., Disp: , Rfl:     furosemide  (LASIX ) 20 MG tablet, TAKE 1/2 TO 1 (ONE-HALF TO ONE) TABLET BY MOUTH ONCE DAILY AS NEEDED FOR  SWELLING, Disp: 90 tablet, Rfl: 0    GARLIC ORAL, Take by mouth., Disp: , Rfl:     hydrALAZINE  (APRESOLINE ) 25 MG tablet, Take 1 tablet by mouth once daily, Disp: 90 tablet, Rfl: 0    levalbuterol  (XOPENEX ) 0.63 mg/3 mL nebulizer solution, Inhale 3 mL (0.63 mg total) by nebulization every six (6) hours as needed for wheezing or shortness of breath., Disp: 3 mL, Rfl: 2    losartan  (COZAAR ) 100 MG tablet, Take 1/2 (one-half) tablet by mouth once daily, Disp: 45 tablet, Rfl: 0    metoPROLOL  tartrate (LOPRESSOR ) 25 MG tablet, Take 1 tablet (25 mg total) by mouth Three (3) times a day as needed. For pulse >100bpm, Disp: 90 tablet, Rfl: 6    mv-mins/folic/lycopene/ginkgo (ONE-A-DAY MEN'S 50+ ADVANTAGE ORAL), Take by mouth., Disp: , Rfl:     oxyCODONE -acetaminophen  (PERCOCET ) 5-325 mg per tablet, Take 1 tablet by mouth every six (6) hours as needed for pain., Disp: 120 tablet, Rfl: 0    simvastatin  (ZOCOR ) 10 MG tablet, Take 1 tablet by mouth once daily, Disp: 90 tablet, Rfl: 0    tamsulosin  (FLOMAX ) 0.4 mg capsule, Take 1 capsule by mouth once daily, Disp: 90 capsule, Rfl: 0    tocilizumab  (ACTEMRA ) 162 mg/0.9 mL Syrg subcutaneous injection, Inject 0.9 mL (162 mg total) under the skin every twenty-one (21) days., Disp: 5.4 mL, Rfl: 3    Allergies  Patient has no known allergies.    Family History[4]    Social History  Short Social History[5]         Physical Exam       ED Triage Vitals   Enc Vitals Group      BP 09/04/24 0934 109/57      Pulse 09/04/24 0930  104      SpO2 Pulse --       Resp 09/04/24 0930 15      Temp 09/04/24 0934 36.4 ??C (97.5 ??F)      Temp src --       SpO2 09/04/24 0930 (!) 84 %     Constitutional: Alert and oriented. Well appearing and in no distress.  Eyes: Conjunctivae are normal.  ENT       Head: Normocephalic and atraumatic.       Nose: No congestion.       Mouth/Throat: Mucous membranes are moist.       Neck: No stridor.  Hematological/Lymphatic/Immunilogical: No cervical lymphadenopathy.  Cardiovascular: Tachycardic. Normal and symmetric distal pulses are present in all extremities.  Respiratory: Expiratory wheezing noted in all lung fields.    Gastrointestinal: Soft and nontender. There is no CVA tenderness.  Genitourinary: Deferred  Musculoskeletal: Normal range of motion in all extremities.  1+ pitting edema to the bilateral forearms.         Right lower leg: 2+ pitting edema to the knees        Left lower leg: 2+ pitting edema to the knees   Neurologic: Normal speech and language. No gross focal neurologic deficits are appreciated.  Skin: Skin is warm, dry and intact. No rash noted.  Psychiatric: Mood and affect are normal. Speech and behavior are normal.       Radiology         XR Chest 1 view Portable    (Results Pending)          Documentation assistance was provided by Learta Loots, Scribe on September 04, 2024 at 9:40 AM for Fairy Pump, MD.    September 04, 2024 10:48 AM. Documentation assistance provided by the scribe. I was present during the time the encounter was recorded. The information recorded by the scribe was done at my direction and has been reviewed and validated by me. [1]   Past Medical History:  Diagnosis Date    Cataract 2005    cataract surgery    COPD (chronic obstructive pulmonary disease)    (CMS-HCC) 10/07/2016    DJD (degenerative joint disease), lumbar     Hypertension     Macular degeneration of right eye 10/17/2022    Mild anemia 01/15/2022    Tobacco abuse 02/15/2014   [2]   Patient Active Problem List  Diagnosis    Essential hypertension, benign    Chronic back pain    CAD (coronary artery disease), autologous vein bypass graft    Right carotid bruit    Tobacco use disorder    Chronic pain syndrome    COPD exacerbation    (CMS-HCC)    Risk for falls    Vision, loss, sudden, left    Giant cell arteritis    (CMS-HCC)    Duodenitis    Melena    Constipation    DDD (degenerative disc disease), lumbar    Dyslipidemia    Macrocytosis without anemia    BPH (benign prostatic hyperplasia)    COPD (chronic obstructive pulmonary disease)    (CMS-HCC)    Mild anemia    Exudative age-related macular degeneration of right eye, unspecified stage    (CMS-HCC)    Atrial fibrillation    (CMS-HCC)   [3]   Past Surgical History:  Procedure Laterality Date    CARDIAC VALVE REPLACEMENT      triple bypass    CATARACT EXTRACTION  CORONARY ARTERY BYPASS GRAFT  2001    x3    EYE SURGERY  2005    cataracts    PR REPAIR ING HERNIA,5+Y/O,REDUCIBL Left 06/19/2015    Procedure: REPAIR INITIAL INGUINAL HERNIA, AGE 26 YEARS OR OLDER; REDUCIBLE;  Surgeon: Curtistine DELENA Poisson, MD;  [facility]: [facility];  Service: Trauma    PR TEMPORAL ARTERY LIGATN OR BX Left 10/23/2017    Procedure: LIGATION OR BIOPSY, TEMPORAL ARTERY;  Surgeon: Fairy Lum Gaskins, MD;  Location: [facility];  Service: ENT    PR UPPER GI ENDOSCOPY,DIAGNOSIS N/A 12/17/2017    Procedure: UGI ENDO, INCLUDE ESOPHAGUS, STOMACH, & DUODENUM &/OR JEJUNUM; DX W/WO COLLECTION SPECIMN, BY BRUSH OR WASH;  Surgeon: Sean Easterly, MD;  [facility];  Service: Gastroenterology   [4]   Family History  Problem Relation Age of Onset    Thyroid disease Sister     Cancer Brother     Glaucoma Neg Hx     Macular degeneration Neg Hx     Retinal detachment Neg Hx     Substance Abuse Disorder Neg Hx     Mental illness Neg Hx    [5]   Social History  Tobacco Use    Smoking status: Former     Current packs/day: 0.00     Types: Cigarettes    Smokeless tobacco: Never    Tobacco comments:     quit for 25 years, restarted in 2014. Denies any use of tobacco at this time   Vaping Use    Vaping status: Never Used   Substance Use Topics    Alcohol use: Yes     Alcohol/week: 1.0 standard drink of alcohol     Types: 1 Cans of beer per week     Comment: daily    Drug use: No        Claudean Fairy Lame, MD  09/04/24 1048

## 2024-09-04 NOTE — Unmapped (Addendum)
 Erik [name] is a 89 y.o. male  with Afib on DOAC, CAD s/p CABG, HTN, COPD who was admitted to [facility] on 9/13 for acute hypoxic respiratory failure due to COPD exacerbation. Hospital course is outlined below by problem.     #Acute Hypoxic Respiratory Failure - Elevated Pro-BNP - COPD exacerbation  Patient was hypoxic on arrival with mild increased work of breathing. Initial presentation concerning for COPD exacerbation vs volume overload. Patient was placed on BiPAP with good response. Initial workup notable for proBNP >3000 (recent proBNP >6000 during ER visit for new-onset atrial fibrillation), and chest x-ray notable for a small right pleural effusion and bibasilar basilar atelectasis, and EKG demonstrating atrial fibrillation with rapid ventricular rate. Given furosemide  20 mg IV in the ER and his initial lung exam not notable for findings of fluid overload as well as diffuse wheezing suggestive of COPD exacerbation. He was treated with breathing treatments, prednisone , and doxycyline for his COPD exacerbation with good response. Bedside POCUS revealed evidence of bilateral pleural effusion with right greater than left, ascites, free fluid around the bladder, and dilated IVC. He has been weaned off oxygen  and passed 6-min walk test on 9/19. PT/OT were consulted and recommended SNF placement, declined by patient with expressed understanding of risks vs benefits. Discharged on 9/19 with instructions to continue home inhalers, and complete 5-day course of doxycycline  and prednisone  taper for COPD exacerbation with purulent sputum production.     #Atrial fibrillation  Patient taking atenolol  50 mg daily and metoprolol  tartrate 25 mg tid for the past 2 weeks prior to admission. He is appropriately anticoagulated with apixaban . Echo this admission showed LV EF >55%, mild-mod pulm HTN, normal RV size and function, and mildly elev RA pressure. He was treated with metoprolol  tartrate 50 mg q6h. Cardioversion completed on 9/17 without complications and with restoration of regular rate and rhythm. He was transitioned to metoprolol  BID following the procedure.    Chronic Problems  # HTN: Home regimen was held on admission (amlodipine  10 mg daily, atenolol  50 mg daily, losartan  50 mg daily, hydralazine  25 mg daily, furosemide  10-20 mg daily prn) given soft pressures initially. Patient received metoprolol  50 mg qid then bid while admitted, and losartan  50 mg was restarted on 9/18.   # HLD: Patient received pravastatin  for home simvastatin  10 mg daily.  # Back pain: Patient received home percocet  q6h as needed for chronic pain.   # BPH: Patient received home tamsulosin  daily.

## 2024-09-04 NOTE — Unmapped (Signed)
 ED Procedure Note    Critical Care    Performed by: Claudean Fairy Lame, MD  Authorized by: Claudean Fairy Lame, MD    Critical care provider statement:     Critical care time (minutes):  40    Critical care time was exclusive of:  Separately billable procedures and treating other patients and teaching time    Critical care was necessary to treat or prevent imminent or life-threatening deterioration of the following conditions:  Respiratory failure    Critical care was time spent personally by me on the following activities:  Ordering and performing treatments and interventions, ordering and review of laboratory studies, ordering and review of radiographic studies, pulse oximetry, re-evaluation of patient's condition, review of old charts, obtaining history from patient or surrogate, evaluation of patient's response to treatment, development of treatment plan with patient or surrogate and examination of patient

## 2024-09-04 NOTE — Unmapped (Signed)
 Family Medicine Inpatient Service  History and Physical Note    Team: [facility] (pgr [phone])  PCP: Keven Crumbly Pap, DO  Date of Admission: September 04, 2024  Code Status: full code  Emergency Contact: Son, granddaughter    ASSESSMENT / PLAN:   Erik [name] is a 89 y.o. male with a past medical history significant for htn, CAD (s/p triple bypass 24 years ago), a fib on eliquis , dyslipidemia who presents with hypoxia.    # Acute Hypoxic Respiratory Failure - COPD exacerbation  Hypoxic on arrival with mild increased work of breathing.  Initial presentation concerning for volume overload versus COPD exacerbation.  Patient was placed on BiPAP and had a good response.  His initial workup was notable for a proBNP elevated to greater than 3000 (recent proBNP greater than 6000 during ER visit for new-onset atrial fibrillation), and chest x-ray notable for a small right pleural effusion and bibasilar basilar atelectasis, and EKG demonstrating atrial fibrillation with rapid ventricular rate.  No response to furosemide  in the ER and his lung exam not notable for findings of fluid overload.  Diffuse wheezing suggestive of COPD exacerbation noted.     - Continue high-flow [place], wean to RA as tolerated with goal O2 88-92%  - levalbuterol  q6h   - ipratropium q6h  - budesonide  nebs 0.5 bid  - s/p solumedrol; start prednisone  40 mg daily starting 09/05/24    # Recent onset atrial fibrillation - Dyspnea - HTN  Patient appears to be taking both atenolol  and metoprolol  regularly for the past 2 weeks.  He is appropriately anticoagulated with apixaban .  He was scheduled for an outpatient cardioversion later this week.  No clear evidence of volume overload; weight stable, no rales on exam.  There is elevated JVD but question significance with no recent TTE.  - Will start the patient on metoprolol  tartrate and patient at a dose of 12.5 mg every 6 hours  - Consolidate metoprolol  to tartrate dose as permissible during the hospitalization  - Continue apixaban  2.5 mg twice daily  - Consult cardiology on 09/05/2024  - Repeat transthoracic echocardiogram requested  - Resume home blood pressure medications as tolerated; suggest resuming amlodipine  at 5 mg given his lower extremity edema    # R groin mass  - Mass with venous flow noted on a recent outpatient LE doppler  - Patient without symptoms on admission  - Defer additional imaging for present    # RLE arterial stenosis  - recent venous Doppler suggested an arterial stenosis, however, the patient is asymptomatic I do not think pursuing PAD in his right lower extremity should be high on his priority list    Chronic problems:   # HTN: Hold amlodipine , atenolol , losartan  and given soft bps  # Dylipidemia: Continue simvastatin  10 mg every day po  # BPH: Continue tamsulosin  0.4 mg po qd  # Constipation: Miralax , Senna prn  # Back pain: continue home percocet  2.5-125 mg q6h prn    # FEN/GI:  - IVF None  - Check electrolytes as indicated, replete as needed.  - Diet Regular    # PPX:   - DVT: Continue home anticoagulation with eliquis  2.5 mg bid    # Dispo: Home      HISTORY OF PRESENT ILLNESS:  Erik [name] is a 89 y.o. male with past medical history of CAD, s/p 3V CABG 2001, atrial fibrillation diagnosed 07/2024, hypertension, HLD, and COPD who was brought in by ambulance with worsening difficulty breathing.  On the morning of admission he was trying to take his medication and a pill became lodged in his throat. This worsened his pre-existing shortness of breath and he decided to call EMS.  On arrival he was found to be hypoxic with EMS.  He notes he has been suffering from congestion and DOE for the past 3-4 weeks and just finished a course of Augmentin  the day prior to admission prescribed by his PCP a possible bacterial rhino-sinusitis.  He doesn't feel that improved his symptoms much.      The patient reports being prescribed metoprolol  to take 3 times a day.  Review of the chart indicates this was dispensed on August 17, 2024.  He received 55 pills at that time and now he has 4 pills left per his report.  He did not realize he was post to take the metoprolol  only as needed.  He has continued to take his atenolol  regularly as well.     Notably, he also had lower extremity Dopplers ordered by his PCP.  These were negative for a DVT.  I did suggest some sort of mass in the right groin, however, I cannot appreciate any mass today.  The patient simply tells me that his right groin gets swollen at times.  There was some consideration of getting a particular Doppler.     He lives alone but has family is nearby.  He does most activities independently but his family checks on him regularly. He states he quit smoking cigarettes several years ago. Denies alcohol or recreational drug use.      ED Course:   Patient received IV furosemide  20 mg but had no urine output and solumedrol 125 mg IV. Pro-BNP was elevated to 3415. Troponins negative. Bicarb 19.4. CXR showing small right pleural effusion and likely atelectasis. Patient was requiring increasing o2 so put on BiPAP.     PAST MEDICAL / SURGICAL HX:  Past Medical History[1]  Past Surgical History[2]    FAMILY HX:   Family History[3]    SOCIAL HX:   Social History[4]    MEDICATIONS / ALLERGIES:  Prescriptions Prior to Admission[5]    Allergies[6]    IMMUNIZATIONS:  Immunization History   Administered Date(s) Administered    COVID-19 VACCINE,MRNA(MODERNA)(PF) 04/25/2020, 05/23/2020    INFLUENZA IIV3 HIGH DOSE 47YRS+(FLUZONE) 11/05/2023    Influenza Vaccine Quad(IM)6 MO-Adult(PF) 01/15/2017, 11/05/2018, 09/16/2019, 10/17/2022    Influenza Virus Vaccine, unspecified formulation 08/23/2014, 11/05/2018, 09/16/2019, 10/17/2022    PNEUMOCOCCAL POLYSACCHARIDE 23-VALENT 12/23/2009    Pneumococcal Conjugate 13-Valent 04/06/2015       REVIEW OF SYSTEMS:  Pertinent positives and negatives per HPI. Patient also reports some swelling in his right groin but denies any mass there.    PHYSICAL EXAM:    Initial ED Vitals:   ED Triage Vitals   Enc Vitals Group      BP 09/04/24 0934 109/57      Pulse 09/04/24 0930 104      SpO2 Pulse 09/04/24 1000 124      Resp 09/04/24 0930 15      Temp 09/04/24 0934 36.4 ??C (97.5 ??F)      Temp src --       SpO2 09/04/24 0930 (!) 84 %      Weight --       Height --       Head Circumference --       Peak Flow --       Pain Score --  Pain Loc --       Pain Education --       Exclude from Growth Chart --        Recent Vitals:  Vitals:    09/04/24 1345   BP:    Pulse: 115   Resp: 13   Temp:    SpO2: 91%       GEN: Well-appearing, elderly, lying in bed, NAD   Eyes: PERRL. No scleral icterus. Conjunctiva non-erythematous. EOMI.  HEENT: NCAT, MMM. Oropharynx clear.  Neck: Supple.  Lymphadenopathy: No cervical or supraclavicular LAD.  CV: Tachycardic. Irregular rhythm. No costochondral tenderness. No cyanosis or clubbing. Cap Refill < 2 secs. There is JVD elevation to angle of the mandible.  Pulm: Wheezing bilaterally. No crackles or rales in the bases.  Abd: Soft.  Nontender. No guarding, rebound.  Normoactive bowel sounds.    Neuro: A&O x 3. No focal deficits. Strength 5/5 UE/LE. Distal sensation to light touch intact.  Ext: 1+ pitting edema in LE bilaterally to mid-shins. L thumb missing distal phalange.  There is no appreciable mass in the right groin.  Skin: No rashes or skin lesions.      LABS/ STUDIES:  All imaging, laboratory studies, and other pertinent tests including electrocardiography were reviewed prior to admission and are summarized within the assessment and plan.     I attest that I have reviewed the student note and that the components of the history of the present illness, the physical exam, and the assessment and plan documented were performed by me or were performed in my presence by the student where I verified the documentation and performed (or re-performed) the exam and medical decision making.  Dorn Glatter, MD    Lorane JONELLE Drape, MS4   September 04, 2024 11:49 AM          [1]   Past Medical History:  Diagnosis Date    Cataract 2005    cataract surgery    COPD (chronic obstructive pulmonary disease)    (CMS-HCC) 10/07/2016    DJD (degenerative joint disease), lumbar     Hypertension     Macular degeneration of right eye 10/17/2022    Mild anemia 01/15/2022    Tobacco abuse 02/15/2014   [2]   Past Surgical History:  Procedure Laterality Date    CARDIAC VALVE REPLACEMENT      triple bypass    CATARACT EXTRACTION      CORONARY ARTERY BYPASS GRAFT  2001    x3    EYE SURGERY  2005    cataracts    PR REPAIR ING HERNIA,5+Y/O,REDUCIBL Left 06/19/2015    Procedure: REPAIR INITIAL INGUINAL HERNIA, AGE 75 YEARS OR OLDER; REDUCIBLE;  Surgeon: Curtistine DELENA Poisson, MD;  [facility];  Service: Trauma    PR TEMPORAL ARTERY LIGATN OR BX Left 10/23/2017    Procedure: LIGATION OR BIOPSY, TEMPORAL ARTERY;  Surgeon: Fairy Lum Gaskins, MD;  [facility];  Service: ENT    PR UPPER GI ENDOSCOPY,DIAGNOSIS N/A 12/17/2017    Procedure: UGI ENDO, INCLUDE ESOPHAGUS, STOMACH, & DUODENUM &/OR JEJUNUM; DX W/WO COLLECTION SPECIMN, BY BRUSH OR WASH;  Surgeon: Sean Easterly, MD;  [facility];  Service: Gastroenterology   [3]   Family History  Problem Relation Age of Onset    Thyroid disease Sister     Cancer Brother     Glaucoma Neg Hx     Macular degeneration Neg Hx  Retinal detachment Neg Hx     Substance Abuse Disorder Neg Hx     Mental illness Neg Hx    [4]   Social History  Socioeconomic History    Marital status: Widowed   Tobacco Use    Smoking status: Former     Current packs/day: 0.00     Types: Cigarettes    Smokeless tobacco: Never    Tobacco comments:     quit for 25 years, restarted in 2014. Denies any use of tobacco at this time   Vaping Use    Vaping status: Never Used   Substance and Sexual Activity    Alcohol use: Yes     Alcohol/week: 1.0 standard drink of alcohol     Types: 1 Cans of beer per week     Comment: daily    Drug use: No   Other Topics Concern    Do you use sunscreen? No    Tanning bed use? No    Are you easily burned? No    Excessive sun exposure? No    Blistering sunburns? Yes     Comment: Past   Social History Narrative    Lives in [place], wife passed 7 years ago. His daughter lives with him. Granddaughter, Delon, manages his finances.      Social Drivers of Psychologist, prison and probation services Strain: Low Risk  (06/08/2024)    Overall Financial Resource Strain (CARDIA)     Difficulty of Paying Living Expenses: Not hard at all   Food Insecurity: No Food Insecurity (06/08/2024)    Hunger Vital Sign     Worried About Running Out of Food in the Last Year: Never true     Ran Out of Food in the Last Year: Never true   Transportation Needs: No Transportation Needs (06/08/2024)    PRAPARE - Therapist, art (Medical): No     Lack of Transportation (Non-Medical): No   Physical Activity: Inactive (06/08/2024)    Exercise Vital Sign     Days of Exercise per Week: 0 days     Minutes of Exercise per Session: 0 min   Stress: No Stress Concern Present (06/08/2024)    [employer] of Occupational Health - Occupational Stress Questionnaire     Feeling of Stress: Not at all   Social Connections: Socially Isolated (06/08/2024)    Social Connection and Isolation Panel     Frequency of Communication with Friends and Family: More than three times a week     Frequency of Social Gatherings with Friends and Family: Twice a week     Attends Religious Services: Never     Database administrator or Organizations: No     Attends Banker Meetings: Never     Marital Status: Widowed   Housing: Low Risk  (06/08/2024)    Housing     Within the past 12 months, have you ever stayed: outside, in a car, in a tent, in an overnight shelter, or temporarily in someone else's home (i.e. couch-surfing)?: No     Are you worried about losing your housing?: No   [5] (Not in a hospital admission)  [6] No Known Allergies

## 2024-09-04 NOTE — Unmapped (Signed)
Bed: 10  Expected date:   Expected time:   Means of arrival:   Comments:  Hold EMS

## 2024-09-05 LAB — BASIC METABOLIC PANEL
ANION GAP: 15 mmol/L — ABNORMAL HIGH (ref 5–14)
BLOOD UREA NITROGEN: 27 mg/dL — ABNORMAL HIGH (ref 9–23)
BUN / CREAT RATIO: 22
CALCIUM: 9.9 mg/dL (ref 8.7–10.4)
CHLORIDE: 95 mmol/L — ABNORMAL LOW (ref 98–107)
CO2: 23.1 mmol/L (ref 20.0–31.0)
CREATININE: 1.21 mg/dL — ABNORMAL HIGH (ref 0.73–1.18)
EGFR CKD-EPI (2021) MALE: 58 mL/min/1.73m2 — ABNORMAL LOW (ref >=60)
GLUCOSE RANDOM: 151 mg/dL (ref 70–179)
POTASSIUM: 4.8 mmol/L (ref 3.5–5.1)
SODIUM: 133 mmol/L — ABNORMAL LOW (ref 135–145)

## 2024-09-05 LAB — CBC
HEMATOCRIT: 31.8 % — ABNORMAL LOW (ref 39.0–48.0)
HEMOGLOBIN: 10.9 g/dL — ABNORMAL LOW (ref 12.9–16.5)
MEAN CORPUSCULAR HEMOGLOBIN CONC: 34.1 g/dL (ref 32.0–36.0)
MEAN CORPUSCULAR HEMOGLOBIN: 33.2 pg — ABNORMAL HIGH (ref 25.9–32.4)
MEAN CORPUSCULAR VOLUME: 97.1 fL — ABNORMAL HIGH (ref 77.6–95.7)
MEAN PLATELET VOLUME: 7.4 fL (ref 6.8–10.7)
PLATELET COUNT: 204 10*9/L (ref 150–450)
RED BLOOD CELL COUNT: 3.28 10*12/L — ABNORMAL LOW (ref 4.26–5.60)
RED CELL DISTRIBUTION WIDTH: 14.1 % (ref 12.2–15.2)
WBC ADJUSTED: 11.3 10*9/L — ABNORMAL HIGH (ref 3.6–11.2)

## 2024-09-05 MED ADMIN — apixaban (ELIQUIS) tablet 2.5 mg: 2.5 mg | ORAL | @ 12:00:00

## 2024-09-05 MED ADMIN — calcium carbonate tablet 600 mg elem calcium: 600 mg | ORAL | @ 12:00:00

## 2024-09-05 MED ADMIN — ipratropium (ATROVENT) 0.02 % nebulizer solution 500 mcg: 500 ug | RESPIRATORY_TRACT | @ 20:00:00

## 2024-09-05 MED ADMIN — budesonide (PULMICORT) nebulizer solution 0.5 mg: .5 mg | RESPIRATORY_TRACT | @ 15:00:00

## 2024-09-05 MED ADMIN — metoPROLOL tartrate (Lopressor) tablet 12.5 mg: 12.5 mg | ORAL | @ 10:00:00 | Stop: 2024-09-05

## 2024-09-05 MED ADMIN — oxyCODONE-acetaminophen (PERCOCET) 5-325 mg tablet 0.5 tablet: .5 | ORAL | @ 01:00:00 | Stop: 2024-09-18

## 2024-09-05 MED ADMIN — metoPROLOL tartrate (Lopressor) tablet 25 mg: 25 mg | ORAL | @ 23:00:00

## 2024-09-05 MED ADMIN — metoPROLOL tartrate (Lopressor) tablet 12.5 mg: 12.5 mg | ORAL | @ 18:00:00 | Stop: 2024-09-05

## 2024-09-05 MED ADMIN — metoPROLOL tartrate (Lopressor) tablet 12.5 mg: 12.5 mg | ORAL

## 2024-09-05 MED ADMIN — budesonide (PULMICORT) nebulizer solution 0.5 mg: .5 mg | RESPIRATORY_TRACT | @ 01:00:00

## 2024-09-05 MED ADMIN — pravastatin (PRAVACHOL) tablet 20 mg: 20 mg | ORAL

## 2024-09-05 MED ADMIN — predniSONE (DELTASONE) tablet 40 mg: 40 mg | ORAL | @ 12:00:00 | Stop: 2024-09-09

## 2024-09-05 MED ADMIN — levalbuterol (XOPENEX) nebulizer solution 0.63 mg: .63 mg | RESPIRATORY_TRACT | @ 15:00:00 | Stop: 2024-09-06

## 2024-09-05 MED ADMIN — ipratropium (ATROVENT) 0.02 % nebulizer solution 500 mcg: 500 ug | RESPIRATORY_TRACT | @ 01:00:00

## 2024-09-05 MED ADMIN — ipratropium (ATROVENT) 0.02 % nebulizer solution 500 mcg: 500 ug | RESPIRATORY_TRACT | @ 15:00:00

## 2024-09-05 MED ADMIN — levalbuterol (XOPENEX) nebulizer solution 0.63 mg: .63 mg | RESPIRATORY_TRACT | @ 08:00:00 | Stop: 2024-09-06

## 2024-09-05 MED ADMIN — cyanocobalamin (vitamin B-12) tablet 1,000 mcg: 1000 ug | ORAL | @ 12:00:00

## 2024-09-05 MED ADMIN — ipratropium (ATROVENT) 0.02 % nebulizer solution 500 mcg: 500 ug | RESPIRATORY_TRACT | @ 08:00:00

## 2024-09-05 MED ADMIN — ascorbic acid (vitamin C) (VITAMIN C) tablet 500 mg: 500 mg | ORAL | @ 12:00:00

## 2024-09-05 MED ADMIN — levalbuterol (XOPENEX) nebulizer solution 0.63 mg: .63 mg | RESPIRATORY_TRACT | @ 20:00:00 | Stop: 2024-09-06

## 2024-09-05 MED ADMIN — oxyCODONE-acetaminophen (PERCOCET) 5-325 mg tablet 0.5 tablet: .5 | ORAL | @ 22:00:00 | Stop: 2024-09-18

## 2024-09-05 MED ADMIN — levalbuterol (XOPENEX) nebulizer solution 0.63 mg: .63 mg | RESPIRATORY_TRACT | @ 01:00:00 | Stop: 2024-09-06

## 2024-09-05 MED ADMIN — tamsulosin (FLOMAX) 24 hr capsule 0.4 mg: .4 mg | ORAL | @ 12:00:00

## 2024-09-05 MED ADMIN — apixaban (ELIQUIS) tablet 2.5 mg: 2.5 mg | ORAL

## 2024-09-05 MED ADMIN — metoPROLOL tartrate (Lopressor) tablet 12.5 mg: 12.5 mg | ORAL | @ 17:00:00 | Stop: 2024-09-05

## 2024-09-05 MED ADMIN — calcium carbonate tablet 600 mg elem calcium: 600 mg | ORAL

## 2024-09-05 NOTE — Unmapped (Signed)
 Pt is alert and oriented. VSS. Oxygen  3L/min via [place]. Mild exertional dyspnea with expiratory wheezes. Need assist for ADLS. Ambulate to the bathroom with walker. Appetite is good.  BMX1. Pain controled well with current pain management. Percocet  x1 for chronic back pain with good effect. Continues on A-fib. Cards consulted. Increased the dosage of Coreg. NPO after MN for possible cardioversion in AM. Pt was updated.  No s/s of acute distress noted. Pt educated about today's plan of care and daily treatments. Pt cooperative with care, and verbalizes understanding. Pt safety maintained. Will continue to monitor and reassess pt.   Problem: Adult Inpatient Plan of Care  Goal: Plan of Care Review  Outcome: Progressing  Goal: Patient-Specific Goal (Individualized)  Outcome: Progressing  Goal: Absence of Hospital-Acquired Illness or Injury  Outcome: Progressing  Intervention: Identify and Manage Fall Risk  Recent Flowsheet Documentation  Taken 09/05/2024 0800 by Arminda Buddle, RN  Safety Interventions:   fall reduction program maintained   low bed  Intervention: Prevent Skin Injury  Recent Flowsheet Documentation  Taken 09/05/2024 1600 by Arminda Buddle, RN  Positioning for Skin: Left  Taken 09/05/2024 1400 by Arminda Buddle, RN  Positioning for Skin: Supine/Back  Taken 09/05/2024 1200 by Arminda Buddle, RN  Positioning for Skin: Left  Taken 09/05/2024 1000 by Arminda Buddle, RN  Positioning for Skin: Right  Taken 09/05/2024 0800 by Arminda Buddle, RN  Positioning for Skin: Supine/Back  Goal: Optimal Comfort and Wellbeing  Outcome: Progressing  Goal: Readiness for Transition of Care  Outcome: Progressing  Goal: Rounds/Family Conference  Outcome: Progressing     Problem: Fall Injury Risk  Goal: Absence of Fall and Fall-Related Injury  Outcome: Progressing  Intervention: Promote Injury-Free Environment  Recent Flowsheet Documentation  Taken 09/05/2024 0800 by Arminda Buddle, RN  Safety Interventions:   fall reduction program maintained   low bed     Problem: Gas Exchange Impaired  Goal: Optimal Gas Exchange  Outcome: Progressing  Intervention: Optimize Oxygenation and Ventilation  Recent Flowsheet Documentation  Taken 09/05/2024 0800 by Arminda Buddle, RN  Head of Bed [facility]) Positioning: HOB elevated     Problem: Comorbidity Management  Goal: Maintenance of COPD Symptom Control  Outcome: Progressing  Goal: Blood Pressure in Desired Range  Outcome: Progressing     Problem: Self-Care Deficit  Goal: Improved Ability to Complete Activities of Daily Living  Outcome: Progressing

## 2024-09-05 NOTE — Unmapped (Signed)
 Family Medicine Inpatient Service  CCU Daily Progress Note      CCU Daily Progress Note     Date of Service: 09/05/2024    Problem List:   Principal Problem:    COPD exacerbation    (CMS-HCC)  Active Problems:    Essential hypertension, benign    Chronic back pain    CAD (coronary artery disease), autologous vein bypass graft    Tobacco use disorder    Chronic pain syndrome    Giant cell arteritis    (CMS-HCC)    Constipation    Dyslipidemia    Macrocytosis without anemia    BPH (benign prostatic hyperplasia)    COPD (chronic obstructive pulmonary disease)    (CMS-HCC)    Atrial fibrillation    (CMS-HCC)      Erik [name] is a 89 y.o. male with past medical history of CAD, s/p 3V CABG 2001, atrial fibrillation diagnosed 07/2024, hypertension, HLD, and COPD who was brought in by ambulance with worsening difficulty breathing.     Interval history: NAEON; slept on BiPAP 10/5, 40% overnight.    Neurological   -no acute issues  -patient lives at home alone and is independent for ADLs    Pulmonary   # Acute Hypoxic Respiratory Failure - COPD exacerbation  Hypoxic on arrival with mild increased work of breathing.  Initial presentation concerning for volume overload versus COPD exacerbation.  Patient was placed on BiPAP and had a good response.  His initial workup was notable for a proBNP elevated to greater than 3000 (recent proBNP greater than 6000 during ER visit for new-onset atrial fibrillation), and chest x-ray notable for a small right pleural effusion and bibasilar basilar atelectasis, and EKG demonstrating atrial fibrillation with rapid ventricular rate.  No diuresis with furosemide  20 mg IV in the ER and his lung exam not notable for findings of fluid overload.  Diffuse wheezing suggestive of COPD exacerbation noted.     - Continue to wean to RA as tolerated with goal O2 88-92%  - levalbuterol  q6h   - ipratropium q6h  - budesonide  nebs 0.5 bid  - s/p solumedrol; start prednisone  40 mg daily starting 09/05/24    # Tobacco use disorder: quit smoking remotely    Cardiovascular   # Recent onset atrial fibrillation   Patient taking both atenolol  50 mg daily and metoprolol  tartrate 25 mg tid for the past 2 weeks.  He is appropriately anticoagulated with apixaban .  He was scheduled for an outpatient cardioversion later this week.  No clear evidence of volume overload; weight stable, no rales on exam.  There is elevated JVD but question significance with no recent TTE.  - Metoprolol  tartrate and patient at a dose of 12.5 mg every 6 hours  - Stop atenolol  50 mg daily  - Consolidate metoprolol  to tartrate dose as permissible during the hospitalization  - Continue apixaban  2.5 mg twice daily  - Consult cardiology on 09/05/2024  - Repeat transthoracic echocardiogram ordered    # HTN  Patient with multiple BP agents outpatient held since admission for soft blood pressures.  - Home regimen:  amlodipine  10 mg daily, atenolol  50 mg daily, losartan  50 mg daily, hydralazine  25 mg daily, furosemide  10-20 mg daily prn  - Inpatient regimen:  metoprolol  12.5 mg qid  - 09/05/24:  will plan to resume furosemide  20 mg daily  - Recommend resuming amlodipine  at 5 mg daily, discontinuing hydralazine     # HLD  - Pravastatin  for home simvastatin  10 mg  daily while admitted     # RLE arterial stenosis  - recent venous Doppler suggested an arterial stenosis, however, the patient is asymptomatic I do not think pursuing PAD in his right lower extremity should be high on his priority list    Renal   - Baseline Cr on admission    MSK   # R groin mass  - Mass with venous flow noted on a recent outpatient LE doppler  - Patient without symptoms on admission  - Defer additional imaging for present    # Back pain: continue home percocet  2.5-125 mg q6h prn    Cultures:  No results found for: BLOOD CULTURE, BLOOD CULTURE, ROUTINE, URINE CULTURE, COMPREHENSIVE, URINE CULTURE, COMPREHENSIVE, LOWER RESPIRATORY CULTURE  WBC (10*9/L)   Date Value   09/04/2024 5.9 FEN/GI   # Constipation: Miralax , Senna prn    Malnutrition Assessment: Not done yet.  There is no height or weight on file to calculate BMI.          Prophylaxis/LDA/Restraints/Consults     Feeding: Oral diet  Analgesia: Pain adequately controlled  Thromboembolic ppx: Fully anticoagulated    Has the patient's primary nurse been included in morning rounds or updated immediately after about the plan of care: Yes, included in multidisciplinary rounding.    Does patient need/have an active type/screen? NA            Would hospice care be appropriate for this patient? No, patient improving or expected to improve    Patient Lines/Drains/Airways Status       Active Active Lines, Drains, & Airways       Name Placement date Placement time Site Days    Peripheral IV 09/04/24 Anterior;Right Forearm 09/04/24  0939  Forearm  less than 1                  Patient Lines/Drains/Airways Status       Active Wounds       None                    Goals of Care     Code Status: Full Code    Designated Healthcare Decision Maker:  Mr. Vinzant current decisional capacity for healthcare decision-making is Full capacity. His designated Educational psychologist) is/are   HCDM (patient stated preference): Kunta, Hilleary Son [phone].      Subjective     Patient reports feeling improved this morning.  He slept well with the BiPAP last evening.    Objective     Vitals - past 24 hours  Temp:  [35.9 ??C (96.6 ??F)-36.8 ??C (98.2 ??F)] 36.8 ??C (98.2 ??F)  Pulse:  [92-116] 92  SpO2 Pulse:  [94-124] 114  Resp:  [12-22] 22  BP: (94-137)/(50-71) 124/52  FiO2 (%):  [40 %-45 %] 40 %  SpO2:  [84 %-97 %] 96 % Intake/Output  I/O last 3 completed shifts:  In: 250 [P.O.:250]  Out: 0      Physical Exam:    GEN: well appearing, sitting up in bed eating breakfast, NAD   HEENT: NCAT, No scleral icterus. Conjunctiva non-erythematous. MMM.  CV: Regular rate and rhythm. No murmurs/rubs/gallops.  Pulm: Normal work of breathing on 3L [place]. CTAB but diminished breath sounds.  Interval resolution of wheezing from admission exm. No crackles or rhonchi appreciated.  Abd: Flat.  Nontender. No guarding, rebound.  Normoactive bowel sounds.    Neuro: A&O x 3. No focal deficits.  Ext: Trace LLE peripheral  edema.  Palpable distal pulses.  Skin: No rashes or skin lesions.       Continuous Infusions:   Infusions Meds[1]    Scheduled Medications:   Scheduled Medications[2]    PRN medications:  PRN Medications[3]    Data/Imaging Review: Reviewed in Epic and personally interpreted on 09/05/2024. See EMR for detailed results.       [1] [2]    apixaban   2.5 mg Oral BID    ascorbic acid  (vitamin C )  500 mg Oral Daily    budesonide   0.5 mg Nebulization BID    calcium  carbonate  600 mg elem calcium  Oral BID    cyanocobalamin  (vitamin B-12)  1,000 mcg Oral Daily    [Provider Hold] furosemide   20 mg Intravenous BID    ipratropium  500 mcg Nebulization Q6H (RT)    levalbuterol   0.63 mg Nebulization Q6H (RT)    metoPROLOL  tartrate  12.5 mg Oral QID    pravastatin   20 mg Oral Nightly    predniSONE   40 mg Oral Daily    tamsulosin   0.4 mg Oral Daily   [3] oxyCODONE -acetaminophen 

## 2024-09-05 NOTE — Unmapped (Signed)
 Cardiology Consult Note    Requesting Attending Physician :  Dorn Debby Glatter, MD  Service Requesting Consult : Family medicine    Assessment/Plan:   Assessment & Plan  Persistent atrial fibrillation  Recent diagnosis of persistent atrial fibrillation, on anticoagulation with apixaban  for three weeks. Initially taking both metoprolol  and atenolol  due to confusion, but atenolol  has been held. Currently on metoprolol  12.5 mg Q6 hours for rate control, planned to be increased to 25 mg Q6 hours. Cardioversion is scheduled for tomorrow, as he has completed three weeks of anticoagulation. Needs to be NPO after midnight in preparation for the procedure.  - Increase metoprolol  to 25 mg Q6 hours  - Ensure NPO after midnight  - Proceed with cardioversion tomorrow    Acute respiratory failure with hypoxia and COPD exacerbation  Presented with worsening shortness of breath and was found to be hypoxic upon EMS arrival. Likely due to a COPD exacerbation, as indicated by the administration of prednisone  and Xopenex . Portable chest x-ray showed new small bilateral pleural effusions with adjacent atelectasis and a moderate hiatal hernia. Dyspnea is suspected to be related to the COPD exacerbation.  - Administer prednisone  and Xopenex  for COPD exacerbation  - Attempt mild diuresis with a negative goal of one liter    Coronary artery disease, status post CABG  Coronary artery disease, underwent CABG in 2001. High sensitivity troponins were stable at 13, 14, and 15.    Hypertension  Chronic condition. Current management includes metoprolol , also used for rate control in atrial fibrillation.    Hyperlipidemia  Chronic condition.        History of Present Illness:     Reason for Consult:   Atrial fibrillation    History of Present Illness  Erik [name] is an 89 year old male with coronary artery disease, persistent atrial fibrillation, hypertension, hyperlipidemia, and COPD who presents with worsening shortness of breath.    He was brought to the emergency department via ambulance due to worsening shortness of breath. Earlier in the day, he experienced a pill lodged in his throat, which prompted him to call EMS. Upon EMS arrival, he was found to be hypoxic.    He has a history of coronary artery disease with CABG performed in 2001. He was recently diagnosed with persistent atrial fibrillation and started on anticoagulation therapy approximately three weeks ago. He has been taking metoprolol  and atenolol  due to confusion with his recent atrial fibrillation diagnosis. He was seen in the cardiology clinic on August 17, 2024, for new-onset atrial fibrillation and was started on apixaban  2.5 mg BID and given PRN metoprolol  for rate control. His atenolol  has been held, and he is currently on metoprolol  12.5 mg every 6 hours for rate control.    He has a history of hypertension, hyperlipidemia, and COPD. He is currently receiving prednisone  and Xopenex  for a COPD exacerbation. He had a recent DVT workup for lower extremity edema, but Doppler studies were negative.    In the emergency room, he received 20 mg of IV furosemide . Lab work showed high sensitivity troponins of 13, 14, and 15, venous lactate of 2.5, and proBNP of 3415. His metabolic panel showed a creatinine of 1.17, sodium of 136, and potassium of 4.3. A portable chest x-ray showed new small bilateral pleural effusions with adjacent atelectasis and a moderate hiatal hernia. The most recent echocardiogram from December 12, 2017, showed an EF of greater than 55% with normal RV function. An EKG on admission showed atrial fibrillation  with a heart rate of 106 beats per minute, right axis deviation, and poor R wave progression with some nonspecific ST-T abnormalities, unchanged from his August 26 EKG.      Allergies:   Allergies[1]    Medications:   Scheduled Meds:Scheduled Medications[2]  Continuous Infusions:Infusions Meds[3]  PRN Meds:.PRN Medications[4]  Outpatient Medications: Current Medications[5]    Medical History:  Past Medical History[6]    Social History:  Tobacco Use History[7]  Social History     Substance and Sexual Activity   Alcohol Use Yes    Alcohol/week: 1.0 standard drink of alcohol    Types: 1 Cans of beer per week    Comment: daily     Social History     Substance and Sexual Activity   Drug Use No       Family History:  Family History[8]    Code Status:  Full Code    Review of Systems: 12 point review negative except for stated above    Objective:     Physical Exam:  VITAL SIGNS: Temp:  [36.5 ??C (97.7 ??F)-37 ??C (98.6 ??F)] 37 ??C (98.6 ??F)  Pulse:  [80-116] 105  SpO2 Pulse:  [84-120] 118  Resp:  [12-23] 19  BP: (106-140)/(50-71) 140/54  FiO2 (%):  [40 %-45 %] 40 %  SpO2:  [92 %-97 %] 93 %  GENERAL:  NAD   Physical Exam  Exam was deferred          Test Results  I personally reviewed and interpreted the following diagnostic studies  Results  LABS  Troponin: 13, 14, 15  Venous lactate: 2.5  ProBNP: 3415  Creatinine: 1.17  Sodium: 136  Potassium: 4.3    RADIOLOGY  Chest x-ray: New small bilateral pleural effusions with adjacent atelectasis and a moderate hiatal hernia    DIAGNOSTIC  Echocardiogram: Ejection fraction >55% with normal right ventricular function (12/12/2017)  EKG: Atrial fibrillation with a heart rate of 106 bpm, right axis deviation, poor R wave progression, nonspecific ST-T abnormality (09/05/2024)        Labs:   Lab Results   Component Value Date    TROPONINI 15 09/04/2024    TROPONINI 14 09/04/2024    TROPONINI 13 09/04/2024     Lab Results   Component Value Date    WBC 11.3 (H) 09/05/2024    HGB 10.9 (L) 09/05/2024    HCT 31.8 (L) 09/05/2024    PLT 204 09/05/2024       Lab Results   Component Value Date    NA 133 (L) 09/05/2024    K 4.8 09/05/2024    CL 95 (L) 09/05/2024    CO2 23.1 09/05/2024    BUN 27 (H) 09/05/2024    CREATININE 1.21 (H) 09/05/2024    GLU 151 09/05/2024    CALCIUM  9.9 09/05/2024    MG 1.9 09/04/2024    PHOS 2.4 09/04/2024 Lab Results   Component Value Date    BILITOT 0.7 09/04/2024    PROT 5.9 09/04/2024    ALBUMIN 3.4 09/04/2024    ALT 16 09/04/2024    AST 39 (H) 09/04/2024    ALKPHOS 44 (L) 09/04/2024       Lab Results   Component Value Date    PT 12.2 08/13/2024    INR 1.07 08/13/2024    APTT 31.1 08/13/2024     Ht Readings from Last 3 Encounters:   08/17/24 157.5 cm (5' 2)   06/08/24 157.5 cm (5' 2)   02/03/24  157.5 cm (5' 2)     Lab Results   Component Value Date    INR 1.07 08/13/2024    INR 0.86 12/15/2017            [1] No Known Allergies  [2]    apixaban   2.5 mg Oral BID    ascorbic acid  (vitamin C )  500 mg Oral Daily    budesonide   0.5 mg Nebulization BID    calcium  carbonate  600 mg elem calcium  Oral BID    cyanocobalamin  (vitamin B-12)  1,000 mcg Oral Daily    [Provider Hold] furosemide   20 mg Intravenous BID    ipratropium  500 mcg Nebulization Q6H (RT)    levalbuterol   0.63 mg Nebulization Q6H (RT)    metoPROLOL  tartrate  12.5 mg Oral Once    metoPROLOL  tartrate  25 mg Oral QID    pravastatin   20 mg Oral Nightly    predniSONE   40 mg Oral Daily    tamsulosin   0.4 mg Oral Daily   [3] [4] oxyCODONE -acetaminophen   [5]   Current Facility-Administered Medications:     apixaban  (ELIQUIS ) tablet 2.5 mg, 2.5 mg, Oral, BID, Wehby, Joseph Sam, MD, 2.5 mg at 09/05/24 9178    ascorbic acid  (vitamin C ) (VITAMIN C ) tablet 500 mg, 500 mg, Oral, Daily, Wehby, Joseph Sam, MD, 500 mg at 09/05/24 9178    budesonide  (PULMICORT ) nebulizer solution 0.5 mg, 0.5 mg, Nebulization, BID, Katherleen Fairy Bring, MD, 0.5 mg at 09/05/24 1047    calcium  carbonate tablet 600 mg elem calcium , 600 mg elem calcium , Oral, BID, Katherleen Fairy Bring, MD, 600 mg elem calcium  at 09/05/24 9178    cyanocobalamin  (vitamin B-12) tablet 1,000 mcg, 1,000 mcg, Oral, Daily, Wehby, Joseph Sam, MD, 1,000 mcg at 09/05/24 9178    [Provider Hold] furosemide  (LASIX ) injection 20 mg, 20 mg, Intravenous, BID, Katherleen Fairy Bring, MD    ipratropium (ATROVENT ) 0.02 % nebulizer solution 500 mcg, 500 mcg, Nebulization, Q6H (RT), Katherleen Fairy Bring, MD, 500 mcg at 09/05/24 1048    levalbuterol  (XOPENEX ) nebulizer solution 0.63 mg, 0.63 mg, Nebulization, Q6H (RT), Katherleen Fairy Bring, MD, 0.63 mg at 09/05/24 1048    metoPROLOL  tartrate (Lopressor ) tablet 12.5 mg, 12.5 mg, Oral, Once, Merilee Dorn Ned, MD    metoPROLOL  tartrate (Lopressor ) tablet 25 mg, 25 mg, Oral, QID, Merilee Dorn Ned, MD    oxyCODONE -acetaminophen  (PERCOCET ) 5-325 mg tablet 0.5 tablet, 0.5 tablet, Oral, Q6H PRN, Merilee Dorn Ned, MD, 0.5 tablet at 09/04/24 2100    pravastatin  (PRAVACHOL ) tablet 20 mg, 20 mg, Oral, Nightly, Katherleen Fairy Bring, MD, 20 mg at 09/04/24 2025    predniSONE  (DELTASONE ) tablet 40 mg, 40 mg, Oral, Daily, Merilee Dorn Ned, MD, 40 mg at 09/05/24 9178    tamsulosin  (FLOMAX ) 24 hr capsule 0.4 mg, 0.4 mg, Oral, Daily, Katherleen Fairy Bring, MD, 0.4 mg at 09/05/24 9178  [6]   Past Medical History:  Diagnosis Date    Cataract 2005    cataract surgery    COPD (chronic obstructive pulmonary disease)    (CMS-HCC) 10/07/2016    DJD (degenerative joint disease), lumbar     Hypertension     Macular degeneration of right eye 10/17/2022    Mild anemia 01/15/2022    Tobacco abuse 02/15/2014   [7]   Social History  Tobacco Use   Smoking Status Former    Current packs/day: 0.00    Types: Cigarettes   Smokeless Tobacco Never   Tobacco Comments  quit for 25 years, restarted in 2014. Denies any use of tobacco at this time   [8]   Family History  Problem Relation Age of Onset    Thyroid disease Sister     Cancer Brother     Glaucoma Neg Hx     Macular degeneration Neg Hx     Retinal detachment Neg Hx     Substance Abuse Disorder Neg Hx     Mental illness Neg Hx

## 2024-09-05 NOTE — Unmapped (Signed)
 Shift Summary  Fall prevention measures were maintained throughout the shift, including scheduled toileting and hourly checks.   PRN pain medication was administered at 9:00 PM for reported pain, with subsequent pain scores returning to zero.   No falls or injuries were documented during the shift.   Patient remained awake and in bed during hourly checks.   Overall, safety goals were met and pain was effectively managed.   Slept majority of night on Bipap 40% 10/5  Remained in rate controlled atrial fibrilation    Absence of Fall and Fall-Related Injury: Fall prevention strategies were consistently maintained throughout the shift, including regular toileting, hourly visual checks, and keeping the room door open; no falls or injuries occurred.

## 2024-09-06 LAB — BASIC METABOLIC PANEL
ANION GAP: 14 mmol/L (ref 5–14)
BLOOD UREA NITROGEN: 18 mg/dL (ref 9–23)
BUN / CREAT RATIO: 16
CALCIUM: 9.7 mg/dL (ref 8.7–10.4)
CHLORIDE: 98 mmol/L (ref 98–107)
CO2: 23.4 mmol/L (ref 20.0–31.0)
CREATININE: 1.14 mg/dL (ref 0.73–1.18)
EGFR CKD-EPI (2021) MALE: 62 mL/min/1.73m2 (ref >=60)
GLUCOSE RANDOM: 120 mg/dL (ref 70–179)
POTASSIUM: 4.7 mmol/L (ref 3.4–4.8)
SODIUM: 135 mmol/L (ref 135–145)

## 2024-09-06 LAB — CBC
HEMATOCRIT: 31.3 % — ABNORMAL LOW (ref 39.0–48.0)
HEMOGLOBIN: 10.9 g/dL — ABNORMAL LOW (ref 12.9–16.5)
MEAN CORPUSCULAR HEMOGLOBIN CONC: 34.9 g/dL (ref 32.0–36.0)
MEAN CORPUSCULAR HEMOGLOBIN: 33.8 pg — ABNORMAL HIGH (ref 25.9–32.4)
MEAN CORPUSCULAR VOLUME: 96.8 fL — ABNORMAL HIGH (ref 77.6–95.7)
MEAN PLATELET VOLUME: 7.5 fL (ref 6.8–10.7)
PLATELET COUNT: 186 10*9/L (ref 150–450)
RED BLOOD CELL COUNT: 3.23 10*12/L — ABNORMAL LOW (ref 4.26–5.60)
RED CELL DISTRIBUTION WIDTH: 14.2 % (ref 12.2–15.2)
WBC ADJUSTED: 11.8 10*9/L — ABNORMAL HIGH (ref 3.6–11.2)

## 2024-09-06 LAB — MAGNESIUM: MAGNESIUM: 2.1 mg/dL (ref 1.6–2.6)

## 2024-09-06 MED ADMIN — oxyCODONE-acetaminophen (PERCOCET) 5-325 mg tablet 0.5 tablet: .5 | ORAL | @ 12:00:00 | Stop: 2024-09-18

## 2024-09-06 MED ADMIN — oxyCODONE-acetaminophen (PERCOCET) 5-325 mg tablet 0.5 tablet: .5 | ORAL | @ 21:00:00 | Stop: 2024-09-18

## 2024-09-06 MED ADMIN — levalbuterol (XOPENEX) nebulizer solution 0.63 mg: .63 mg | RESPIRATORY_TRACT | @ 01:00:00 | Stop: 2024-09-06

## 2024-09-06 MED ADMIN — tamsulosin (FLOMAX) 24 hr capsule 0.4 mg: .4 mg | ORAL | @ 12:00:00

## 2024-09-06 MED ADMIN — ascorbic acid (vitamin C) (VITAMIN C) tablet 500 mg: 500 mg | ORAL | @ 12:00:00

## 2024-09-06 MED ADMIN — levalbuterol (XOPENEX) nebulizer solution 0.63 mg: .63 mg | RESPIRATORY_TRACT | @ 08:00:00 | Stop: 2024-09-06

## 2024-09-06 MED ADMIN — calcium carbonate tablet 600 mg elem calcium: 600 mg | ORAL

## 2024-09-06 MED ADMIN — metoPROLOL tartrate (Lopressor) tablet 25 mg: 25 mg | ORAL | @ 11:00:00 | Stop: 2024-09-06

## 2024-09-06 MED ADMIN — budesonide (PULMICORT) nebulizer solution 0.5 mg: .5 mg | RESPIRATORY_TRACT | @ 13:00:00

## 2024-09-06 MED ADMIN — metoPROLOL tartrate (Lopressor) tablet 25 mg: 25 mg | ORAL | @ 03:00:00

## 2024-09-06 MED ADMIN — doxycycline (VIBRA-TABS) tablet 100 mg: 100 mg | ORAL | @ 22:00:00 | Stop: 2024-09-11

## 2024-09-06 MED ADMIN — cyanocobalamin (vitamin B-12) tablet 1,000 mcg: 1000 ug | ORAL | @ 12:00:00

## 2024-09-06 MED ADMIN — apixaban (ELIQUIS) tablet 2.5 mg: 2.5 mg | ORAL

## 2024-09-06 MED ADMIN — ipratropium (ATROVENT) 0.02 % nebulizer solution 500 mcg: 500 ug | RESPIRATORY_TRACT | @ 08:00:00

## 2024-09-06 MED ADMIN — ipratropium (ATROVENT) 0.02 % nebulizer solution 500 mcg: 500 ug | RESPIRATORY_TRACT | @ 19:00:00

## 2024-09-06 MED ADMIN — ipratropium (ATROVENT) 0.02 % nebulizer solution 500 mcg: 500 ug | RESPIRATORY_TRACT | @ 01:00:00

## 2024-09-06 MED ADMIN — ipratropium (ATROVENT) 0.02 % nebulizer solution 500 mcg: 500 ug | RESPIRATORY_TRACT | @ 13:00:00

## 2024-09-06 MED ADMIN — perflutren lipid microspheres (DEFINITY) injection 3 mL: 3 mL | INTRAVENOUS | @ 14:00:00 | Stop: 2024-09-06

## 2024-09-06 MED ADMIN — levalbuterol (XOPENEX) nebulizer solution 0.63 mg: .63 mg | RESPIRATORY_TRACT | @ 19:00:00 | Stop: 2024-09-08

## 2024-09-06 MED ADMIN — metoPROLOL tartrate (Lopressor) tablet 50 mg: 50 mg | ORAL | @ 22:00:00

## 2024-09-06 MED ADMIN — predniSONE (DELTASONE) tablet 40 mg: 40 mg | ORAL | @ 12:00:00 | Stop: 2024-09-09

## 2024-09-06 MED ADMIN — budesonide (PULMICORT) nebulizer solution 0.5 mg: .5 mg | RESPIRATORY_TRACT | @ 01:00:00

## 2024-09-06 MED ADMIN — levalbuterol (XOPENEX) nebulizer solution 0.63 mg: .63 mg | RESPIRATORY_TRACT | @ 13:00:00 | Stop: 2024-09-08

## 2024-09-06 MED ADMIN — metoPROLOL tartrate (Lopressor) tablet 50 mg: 50 mg | ORAL | @ 16:00:00

## 2024-09-06 MED ADMIN — apixaban (ELIQUIS) tablet 2.5 mg: 2.5 mg | ORAL | @ 12:00:00

## 2024-09-06 MED ADMIN — doxycycline (VIBRA-TABS) tablet 100 mg: 100 mg | ORAL | @ 14:00:00 | Stop: 2024-09-11

## 2024-09-06 MED ADMIN — pravastatin (PRAVACHOL) tablet 20 mg: 20 mg | ORAL

## 2024-09-06 MED ADMIN — aluminum-magnesium hydroxide-simethicone (MAALOX MAX) 80-80-8 mg/mL oral suspension: 30 mL | ORAL

## 2024-09-06 NOTE — Unmapped (Signed)
 Cardiology Consult Note  [facility] Cardiology Service    Requesting Attending Physician :  Dorn Debby Glatter, MD  Service Requesting Consult : [facility] - [facility]WO[facility] [name]    Date of Service: September 06, 2024 7:49 AM   Code Status:   Orders Placed This Encounter   Procedures    Full Code     Standing Status:   Standing     Number of Occurrences:   1        ID: Erik [name] is a 89 y.o. male with a past medical history significant for coronary artery disease s/p CABG, persistent atrial fibrillation (on eliquis ), hypertension, hyperlipidemia, and COPD who is currently admitted for  worsening dyspnea; found to be have COPD exacerbation. Cardiology consulted to assist with the evaluation and management of persistent atrial fibrillation.        Assessment/Plan:   # Persistent atrial fibrillation  Recent diagnosis of persistent atrial fibrillation, on anticoagulation with apixaban  for three weeks. Initially taking both metoprolol  and atenolol  due to confusion, but atenolol  has been held. He continues to be in afib with RVR & occasional PVC's with rates in the 90-120's bpm at rest. Currently on metoprolol  25 mg Q6 hours for rate control, planned to be increased to 50 mg Q6 hours.Bp remains stable at 142/79.  Initially scheduled for a DCCV on today; however, patient continues to have significant pulmonary symptoms and now elevated WBC of 11.8 which raises suspicion for possible pneumonia, although the WBC could also be elevated due to the prednisone  that was started yesterday; we will defer DCCV at this time until patient's respiratory status improves; can consider procedure for tomorrow 9/16.  At this time, continue rate control measures.   - Increase metoprolol  to 50 mg Q6 hours.  - continue Eliquis  2.5 mg twice daily for anticoagulation.  - TTE pending.   - Ensure NPO after midnight  - Consider tentative cardioversion for tomorrow 9/16.      # Acute respiratory failure with hypoxia and COPD exacerbation  Continues to have worsening dyspnea requiring 3 L via ; Likely due to a COPD exacerbation; patient does not appear to be volume overloaded at this time. His Portable chest x-ray showed new small bilateral pleural effusions with adjacent atelectasis and a moderate hiatal hernia. Dyspnea is suspected to be related to the COPD exacerbation.  - Management per primary team.        # Coronary artery disease, status post CABG  Coronary artery disease, underwent CABG in 2001. High sensitivity troponins were stable at 13, 14, and 15. Patient is without chest pain or anginal symptoms.     - Continue formulary pravastatin  20 mg daily.    - Aspirin  contraindicated at this time due to patient being on DOAC.            Thank you for consulting the medicine Cardiology team. Recommendations communicated via face to face and epic chat.  Please do not hesitate to contact me with any questions.          Subjective:     24 hour events  No acute events overnight    The patient states that he is feeling fair at this time; he continues to c/o productive cough, dyspnea and urinary frequency throughout the night; he denies having any chest pain. He has been tolerating the current metoprolol  well, but continues to be poorly rate controlled with rates up to the high 120's bpm while at rest. He does not appear to  be volume overloaded at this time.     Allergies:   Allergies[1]    Medications:   Scheduled Meds:Scheduled Medications[2]    Code Status:  Full Code    Review of Systems: 10 point review negative except for stated above    Objective:     Physical Exam:  VITAL SIGNS: Temp:  [36.6 ??C (97.8 ??F)-37 ??C (98.6 ??F)] 36.6 ??C (97.8 ??F)  Pulse:  [80-117] 109  SpO2 Pulse:  [84-125] 116  Resp:  [11-23] 17  BP: (120-140)/(54-78) 132/64  SpO2:  [93 %-96 %] 95 %    Physical Exam   Constitutional: He appears chronically ill and acutely ill.   HENT:   Nose: Nose normal. Mouth/Throat: Oropharynx is clear.   Eyes: Pupils are equal, round, and reactive to light. Erythema of the sclera of the left eye    Neck: No JVD present.   Cardiovascular: S1 normal and S2 normal. An irregularly irregular rhythm present. Occasional extrasystoles are present. Tachycardia present.   Pulses:       Radial pulses are 2+ on the right side and 2+ on the left side.   Pulmonary/Chest: Increased effort noted. He exhibits no tenderness.   Rhonchi and coarse crackles upon auscultation bilaterally; congested, productive cough with yellow/green sputum production; chest expansion symmetrical    Abdominal: Soft. Bowel sounds are normal. He exhibits no distension.   Musculoskeletal:         General: No edema. Normal range of motion.      Cervical back: Normal range of motion and neck supple.   Neurological: He is alert and oriented to person, place, and time.   Skin: Skin is warm and dry.          Relative labs: LABS  Troponin: 13, 14, 15  Venous lactate: 2.5  ProBNP: 3415     Test Results  ECG: (most recent ECG personally reviewed) atrial fibrillation with RVR, right axis deviation    Echocardiogram: pending     CXR: 09/04/2024  Impression  Interval new small bilateral pleural effusion with adjacent atelectasis/airspace disease.  Moderate hiatal hernia.        Echocardiogram: 11/2017  Interpretation Summary  ? Technically difficult study due to chest wall/lung interference  ? Normal left ventricular systolic function, ejection fraction > 55%  ? Normal right ventricular systolic function      CABG performed in 2001       Nuclear Stress:none on file   Cath: none on file    Labs:   Lab Results   Component Value Date    TROPONINI 15 09/04/2024    TROPONINI 14 09/04/2024    TROPONINI 13 09/04/2024     Results for orders placed or performed during the hospital encounter of 09/04/24   Comprehensive metabolic panel   Result Value Ref Range    Sodium 136 135 - 145 mmol/L    Potassium      Chloride 102 98 - 107 mmol/L    CO2 19.4 (L) 20.0 - 31.0 mmol/L    Anion Gap 15 (H) 5 - 14 mmol/L    BUN      Creatinine eGFR CKD-EPI (2021) Male      Glucose 123 70 - 179 mg/dL    Calcium  8.5 (L) 8.7 - 10.4 mg/dL    Albumin 3.4 3.4 - 5.0 g/dL    Total Protein 5.9 5.7 - 8.2 g/dL    Total Bilirubin 0.7 0.3 - 1.2 mg/dL    AST 39 (H) <=65  U/L    ALT 16 10 - 49 U/L    Alkaline Phosphatase 44 (L) 46 - 116 U/L   Pro-BNP   Result Value Ref Range    PRO-BNP 3,415.0 (H) <=300.0 pg/mL   hsTroponin I (serial 0-2-6H w/ delta)   Result Value Ref Range    hsTroponin I 13 <=53 ng/L   Magnesium  Level   Result Value Ref Range    Magnesium  1.9 1.6 - 2.6 mg/dL   Phosphorus Level   Result Value Ref Range    Phosphorus 2.4 2.4 - 5.1 mg/dL   Blood Gas, Venous   Result Value Ref Range    Specimen Source Venous     FIO2 Venous Not Specified     pH, Venous 7.36 7.32 - 7.43    pCO2, Ven 40 40 - 60 mm Hg    pO2, Ven 37 35 - 40 mm Hg    HCO3, Ven 22 22 - 27 mmol/L    Base Excess, Ven -2.5 (L) -2.0 - 2.0    O2 Saturation, Venous 67.7 40.0 - 85.0 %   hsTroponin I - 2 Hour   Result Value Ref Range    hsTroponin I 14 <=53 ng/L    delta hsTroponin I 1 <=7 ng/L   Basic metabolic panel   Result Value Ref Range    Sodium 136 135 - 145 mmol/L    Potassium 4.3 3.4 - 4.8 mmol/L    Chloride 100 98 - 107 mmol/L    CO2 19.4 (L) 20.0 - 31.0 mmol/L    Anion Gap 17 (H) 5 - 14 mmol/L    BUN 12 9 - 23 mg/dL    Creatinine 8.82 9.26 - 1.18 mg/dL    BUN/Creatinine Ratio 10     eGFR CKD-EPI (2021) Male 60 >=60 mL/min/1.26m2    Glucose 128 70 - 179 mg/dL    Calcium  8.9 8.7 - 10.4 mg/dL   Blood Gas, Venous   Result Value Ref Range    Specimen Source Venous     FIO2 Venous 45%     pH, Venous 7.36 7.32 - 7.43    pCO2, Ven 37 (L) 40 - 60 mm Hg    pO2, Ven 42 (H) 35 - 40 mm Hg    HCO3, Ven 20 (L) 22 - 27 mmol/L    Base Excess, Ven -5.0 (L) -2.0 - 2.0    O2 Saturation, Venous 75.8 40.0 - 85.0 %   Lactate, Venous, Whole Blood   Result Value Ref Range    Lactate, Venous 2.5 (H) 0.5 - 1.8 mmol/L   hsTroponin I - 6 Hour   Result Value Ref Range    hsTroponin I 15 <=53 ng/L    delta hsTroponin I 1 <=7 ng/L   Basic Metabolic Panel   Result Value Ref Range    Sodium 133 (L) 135 - 145 mmol/L    Potassium 4.8 3.5 - 5.1 mmol/L    Chloride 95 (L) 98 - 107 mmol/L    CO2 23.1 20.0 - 31.0 mmol/L    Anion Gap 15 (H) 5 - 14 mmol/L    BUN 27 (H) 9 - 23 mg/dL    Creatinine 8.78 (H) 0.73 - 1.18 mg/dL    BUN/Creatinine Ratio 22     eGFR CKD-EPI (2021) Male 58 (L) >=60 mL/min/1.76m2    Glucose 151 70 - 179 mg/dL    Calcium  9.9 8.7 - 10.4 mg/dL   CBC   Result Value Ref Range  WBC 11.3 (H) 3.6 - 11.2 10*9/L    RBC 3.28 (L) 4.26 - 5.60 10*12/L    HGB 10.9 (L) 12.9 - 16.5 g/dL    HCT 68.1 (L) 60.9 - 48.0 %    MCV 97.1 (H) 77.6 - 95.7 fL    MCH 33.2 (H) 25.9 - 32.4 pg    MCHC 34.1 32.0 - 36.0 g/dL    RDW 85.8 87.7 - 84.7 %    MPV 7.4 6.8 - 10.7 fL    Platelet 204 150 - 450 10*9/L   ECG 12 Lead   Result Value Ref Range    EKG Systolic BP  mmHg    EKG Diastolic BP  mmHg    EKG Ventricular Rate 114 BPM    EKG Atrial Rate 127 BPM    EKG P-R Interval 224 ms    EKG QRS Duration 94 ms    EKG Q-T Interval 376 ms    EKG QTC Calculation 518 ms    EKG Calculated P Axis  degrees    EKG Calculated R Axis 164 degrees    EKG Calculated T Axis 53 degrees    QTC Fredericia 465 ms   ECG 12 Lead   Result Value Ref Range    EKG Systolic BP  mmHg    EKG Diastolic BP  mmHg    EKG Ventricular Rate 106 BPM    EKG Atrial Rate  BPM    EKG P-R Interval  ms    EKG QRS Duration 92 ms    EKG Q-T Interval 384 ms    EKG QTC Calculation 510 ms    EKG Calculated P Axis  degrees    EKG Calculated R Axis 166 degrees    EKG Calculated T Axis -73 degrees    QTC Fredericia 464 ms   ECG 12 Lead   Result Value Ref Range    EKG Systolic BP  mmHg    EKG Diastolic BP  mmHg    EKG Ventricular Rate 114 BPM    EKG Atrial Rate  BPM    EKG P-R Interval  ms    EKG QRS Duration 94 ms    EKG Q-T Interval 376 ms    EKG QTC Calculation 518 ms    EKG Calculated P Axis  degrees    EKG Calculated R Axis 164 degrees    EKG Calculated T Axis 53 degrees    QTC Fredericia 465 ms CBC w/ Differential   Result Value Ref Range    WBC 5.9 3.6 - 11.2 10*9/L    RBC 3.20 (L) 4.26 - 5.60 10*12/L    HGB 10.8 (L) 12.9 - 16.5 g/dL    HCT 68.4 (L) 60.9 - 48.0 %    MCV 98.4 (H) 77.6 - 95.7 fL    MCH 33.7 (H) 25.9 - 32.4 pg    MCHC 34.3 32.0 - 36.0 g/dL    RDW 85.4 87.7 - 84.7 %    MPV 7.8 6.8 - 10.7 fL    Platelet 205 150 - 450 10*9/L    nRBC 0 <=4 /100 WBCs    Neutrophils % 72.6 %    Lymphocytes % 17.7 %    Monocytes % 7.6 %    Eosinophils % 1.3 %    Basophils % 0.8 %    Absolute Neutrophils 4.3 1.8 - 7.8 10*9/L    Absolute Lymphocytes 1.0 (L) 1.1 - 3.6 10*9/L    Absolute Monocytes 0.5 0.3 - 0.8 10*9/L  Absolute Eosinophils 0.1 0.0 - 0.5 10*9/L    Absolute Basophils 0.0 0.0 - 0.1 10*9/L     Lab Results   Component Value Date    PLT 204 09/05/2024     Lab Results   Component Value Date    INR 1.07 08/13/2024     Lab Results   Component Value Date    PROBNP 3,415.0 (H) 09/04/2024       I personally spent 40 minutes face-to-face and non-face-to-face in the care of this patient, which includes all pre, intra, and post visit time on the date of service; reviewing EMR, patient physical exam, reviewing results, patient/family education, communication with primary team and all related documentation.  All documented time was specific to the E/M visit and does not include any procedures that may have been performed.    Almendra Loria, ACNP-AG  September 06, 2024 7:49 AM  Division of Cardiology   Cardiovascular Medicine         Disclaimer: Nuance Dragon voice recognition software was used to create portions of this document.  An attempt at proofreading has been made to minimize errors.  If, however, the reader notes inconsistent information and/or needs to ask questions concerning any part of the document, please contact me for clarification.  Occasional interpretation errors may inadvertently occur due to limitations in the software.          [1] No Known Allergies  [2]    apixaban   2.5 mg Oral BID    ascorbic acid  (vitamin C )  500 mg Oral Daily    budesonide   0.5 mg Nebulization BID    calcium  carbonate  600 mg elem calcium  Oral BID    cyanocobalamin  (vitamin B-12)  1,000 mcg Oral Daily    [Provider Hold] furosemide   20 mg Intravenous BID    ipratropium  500 mcg Nebulization Q6H (RT)    metoPROLOL  tartrate  25 mg Oral QID    pravastatin   20 mg Oral Nightly    predniSONE   40 mg Oral Daily    tamsulosin   0.4 mg Oral Daily

## 2024-09-06 NOTE — Unmapped (Signed)
 Shift Summary  Levalbuterol  was administered twice during the shift for respiratory support.   Doxycycline  was administered in the morning.   Oxygen  therapy was maintained at 3 L/min via nasal cannula throughout the shift.   Patient required minimal assist for mobility and declined use of assistive device during ambulation.   Patient remained irritable and discharge planning was discussed with family support identified.     Optimal Gas Exchange: Oxygen  saturation fluctuated between 91% and 95% on 3 L/min nasal cannula, with regular and unlabored respiratory pattern and diminished left breath sounds throughout the shift; levalbuterol  was administered twice and respiratory treatments were tolerated well.     Maintenance of COPD Symptom Control: Persistent productive cough with green sputum and exceptions to WDL noted in respiratory assessments, while levalbuterol  and doxycycline  were administered; mild pulmonary hypertension was described in echocardiogram findings.     Improved Ability to Complete Activities of Daily Living: Minimal assist required for mobility and dressing tasks, with patient reporting independence in dressing but deferring demonstration due to lines/leads; ambulation occurred with standby/contact guard assist and no assistive device, though patient reached for environmental support.     Optimal Chronic Illness Coping: Patient remained irritable and had exceptions to WDL in psychosocial assessments throughout the shift, with family members identified as support and discharge planning noted as a concern.     Effective Oxygenation and Ventilation: Respiratory rate remained stable at 13 bpm post-treatment, and oxygen  was delivered via nasal cannula at a consistent flow rate; NPPV was discontinued due to intolerance/refusal.

## 2024-09-06 NOTE — Unmapped (Signed)
 Shift Summary  NPPV was discontinued due to intolerance and respiratory treatments were tolerated well.   Oxygen  therapy was maintained throughout the shift.   Pain remained intermittent and low, with comfort interventions provided.   Skin integrity and IV site remained stable with ongoing protective measures.   Fall prevention strategies were consistently implemented and no falls occurred; overall, remained stable with ongoing interventions.     Absence of Hospital-Acquired Illness or Injury: Skin integrity remained unchanged with persistent bruising and redness, and peripheral IV site and dressing stayed clean, dry, and intact throughout the shift; device and skin protection interventions were maintained consistently.     Optimal Comfort and Wellbeing: Pain was described as intermittent discomfort and chronic, with pain scores fluctuating slightly but remaining low; repositioning and comfort interventions were provided.     Absence of Fall and Fall-Related Injury: Fall prevention measures were consistently implemented, including regular toileting, hourly visual checks, and keeping the room door open, with no falls or injuries documented.     Maintenance of COPD Symptom Control: Cough alternated between strong and non-productive/congested, respiratory treatments were tolerated well, and oxygen  therapy was continued; NPPV was discontinued due to intolerance.

## 2024-09-06 NOTE — Unmapped (Signed)
 Problem: Gas Exchange Impaired  Goal: Optimal Gas Exchange  Outcome: Ongoing - Unchanged     Problem: Noninvasive Ventilation Acute  Goal: Effective Unassisted Ventilation and Oxygenation  Outcome: Ongoing - Unchanged     Problem: Breathing Pattern Ineffective  Goal: Effective Breathing Pattern  Outcome: Ongoing - Unchanged     Problem: COPD (Chronic Obstructive Pulmonary Disease)  Goal: Optimal Chronic Illness Coping  Outcome: Ongoing - Unchanged  Goal: Optimal Level of Functional Independence  Outcome: Ongoing - Unchanged  Goal: Absence of Infection Signs and Symptoms  Outcome: Ongoing - Unchanged  Goal: Improved Oral Intake  Outcome: Ongoing - Unchanged  Goal: Effective Oxygenation and Ventilation  Outcome: Ongoing - Unchanged

## 2024-09-06 NOTE — Unmapped (Signed)
 OCCUPATIONAL THERAPY  Evaluation (09/06/24 1406)    Patient Name:  Erik [name]       Medical Record Number: [id number]     Date of Birth: [birth year]  Sex: Male      Post-Discharge Occupational Therapy Recommendations: 5x weekly, Low intensity          Equipment Recommendation  OT DME Recommendations: Defer to post acute       OT Treatment Diagnosis: Generalized muscle weakness, Reduced mobility         Assessment  Assessment: Per chart, Erik [name] is a 89 y.o. male with past medical history of CAD, s/p 3V CABG 2001, atrial fibrillation diagnosed 07/2024, hypertension, HLD, and COPD who was brought in by ambulance with worsening difficulty breathing likely secondary to COPD exacerbation.         Pt reports that prior to admission he was independent with all BADLs and household IADLs. Pt does not drive. Pt ambulates with no assistive devices and endorses no falls. Pt reports he was previosuly provided with supplemental O2 at home though does not use it. Pt enjoys sports.        Pt supine in bed upon OT arrival educated on the role of OT and OT evaluation in the acute care setting with Pt agreeable to session. Pt presents to acute OT with the above-mentioned conditions and below baseline function level with ADLs and functional transfers on this date, with Pt completing bed mobility supine <> sitting EOB with SBA, increased time and use of bed features; sit <> stand from EOB with SBA-CGA and decresed eccentric control when descending; functional ambulation ~86ft with SBA-CGA and Pt reaching for external supports and ambulating with a significantly forward-flexed posture and BUEs on his knees for support. Pt firmly declining use of assistive devices and therapist assist when offered. Pt also deferring all additional ADL tasks, citing frustration with lines/leads. Anticipate Pt required min A for toileting, min A for dressing, mod A for bathing, and CGA for grooming tasks at this time. Pt strongly values his independence, though observed with decreased safety awareness and Pt reports difficulty managing needs at home (e.g. difficulty completing showers, significant SOB when ambulating to the bathroom; difficulty arranging transportation, etc.). Co-treat with PT required to safely progress mobility and 2/2 activity tolerance. Observed deficits, including decreased endurance, decreased functional strength, SOB, decreased functional mobility, and impaired balance all currently impacting Pt's independence and safety with ADLs and functional transfers compared with baseline.  Pt will benefit from skilled acute OT services to address observed deficits and maximize independence and safety with ADLs. Recommend 5xLOW post-acute. DME: Defer to post-acute.     After review of the patient's occupational profile and history, assessment of occupational performance, clinical decision making, and development of POC, the patient presents as a Moderate complexity case.    Problem List: Decreased safety awareness, Decreased strength, Impaired balance, Decreased endurance, Decreased mobility, Gait deviation, Impaired ADLs, Fall risk, Decreased activity tolerance        Clinical Decision Making: Moderate Complexity    Today's Interventions: ADL retraining, Balance activities, Bed mobility, Compensatory tech. training, Conservation, Education - Patient, Functional mobility, Postular / Proximal stability, Positioning, Range of motion, Safety education, Transfer training  Today's Interventions: Pt educated on role of OT, OT eval, OT POC, and safety; bed mobilty, functional transfers, functional ambulation, monitoring vitals, safety, AM-PAC    Activity Tolerance During Today's Session  Tolerated treatment well    Plan  Planned Frequency of Treatment: Plan of Care  Initiated: 09/06/24  1-2x per day Weekly Frequency: 3-4 days per week       Planned Interventions:  Education (Patient/Family/Caregiver), Home Exercise Program, Therapeutic Activity, Therapeutic Exercise, Self-Care/Home Training      GOALS:   Patient and Family Goals: to return home and maintain his independence    Short Term:   SHORT GOAL #1: Pt will complete toilet t/f with supervision using LRAD; manage clothing and all toileting tasks with supervision.   Time Frame : 2 weeks  SHORT GOAL #2: Pt will complete oral care/grooming task standing at sink-level with SBA using LRAD.   Time Frame : 2 weeks  SHORT GOAL #3: Pt will complete full body dressing with SBA, using LRAD and AE as needed.   Time Frame : 2 weeks        Long Term Goal #1: Pt will maximize independence with ADLs and functional transfers within 2 weeks.  Time Frame: 2 months    Barriers to Discharge: Inability to safely perform ADLS, Decreased caregiver support    Subjective  Medical Updates Since Last Visit/Relevant PMH Affecting Clinical Decision Making:    Prior Functional Status Pt reports that prior to admission he was independent with all BADLs and household IADLs. Pt does not drive. Pt ambulates with no assistive devices and endorses no falls. Pt reports he was previosuly provided with supplemental O2 at home though does not use it. Pt enjoys sports.    Living Situation  Living Environment: House  Lives With: Alone  Home Living: One level home, Tub/shower unit, Standard height toilet, Stairs to enter with rails  Rail placement (outside): Bilateral rails in reach  Number of Stairs to Enter (outside): 3  Caregiver Identified?: No     Equipment available at home: Straight cane    Medical Tests / Procedures: Reviewed in EPIC       Patient / Caregiver reports: Pt and RN agreeable to OT session.      Past Medical History[1] Social History     Tobacco Use    Smoking status: Former     Current packs/day: 0.00     Types: Cigarettes    Smokeless tobacco: Never    Tobacco comments:     quit for 25 years, restarted in 2014. Denies any use of tobacco at this time   Substance Use Topics    Alcohol use: Yes     Alcohol/week: 1.0 standard drink of alcohol     Types: 1 Cans of beer per week     Comment: daily      Past Surgical History[2] Family History[3]     Patient has no known allergies.     Objective Findings  Precautions / Restrictions  Falls precautions       Weight Bearing  Non-applicable    Required Braces or Orthoses  Non-applicable    Communication Preference  Verbal, Written, Visual       Pain  Pt reports no pain this session.    Equipment / Customer service manager, Supplemental oxygen , Vascular access (PIV, TLC, Port-a-cath, PICC) (3L supplemental O2)    Cognition   Orientation Level:  Oriented x 4   Arousal/Alertness:  Appropriate responses to stimuli   Attention Span:  Attends with cues to redirect   Memory:  Appears intact   Following Commands:  Follows one-step commands   Safety Judgment:  Decreased awareness of need for safety   Awareness of Errors and Problem Solving:  Assistance required to identify errors made, Assistance required to  generate solutions    Vision / Hearing   Vision: Wears glasses all the time     Hearing: No deficit identified         Hand Function:  Right Hand Function: Right hand grip strength, ROM and coordination WNL  Left Hand Function: Left hand grip strength, ROM and coordination WNL  Hand Dominance: Right    Skin Inspection:  Skin Inspection: Intact where visualized    Face/Cervical ROM:  Face ROM: WFL  Cervical ROM comments: NT    ROM / Strength:     LE ROM/Strength: Left WFL, Right WFL  LE ROM/ Strength Comment: BUE with AROM grossly WFL though Pt with significant forward flexed posture limiting shoulder flexion functionally    Coordination:  Coordination: Not tested    Sensation:  RUE Sensation: RUE impaired  LUE Sensation: LUE impaired  RLE Sensation: RLE impaired  LLE Sensation: LLE impaired  Sensory/ Proprioception/ Stereognosis comments: Pt reports chronic numbness/tingling to B hands and B feet    Balance:  Static Sitting-Level of Assistance: Stand by Camera operator of Assistance: Stand by Surveyor, mining of Assistance: Stand by assistance  Dynamic Standing - Level of Assistance: Contact guard  Standing Balance comments: no assistive device    Functional Mobility  Transfers: Contact Guard assist, Standby assist  Bed Mobility - Needs Assistance: Standby assist  Ambulation: ~74ft with SBA-CGA and Pt firmly declining use of assistive device, though reaching for support from the environment, including foot of bed    ADLs  UB Dressing:  (Pt deferred demonstration/simulation 2/2 lines/leads though reports independence)  LB Dressing:  (Pt deferred demonstration, though stated he is independnet with managing socks and dressing tasks)  IADLs: NT    Vitals / Orthostatics  Vitals/Orthostatics: Pt with HR 71-115 at rest, HR after ambulation 100-127, and HR 88 at end of session. Pt with SpO2 96-91% at rest, 88-89% after amb, recovered to 90% at end of session on 3L supplemental O2 via [place] throughout session. Pt with BP 118/44 (MAP 68). Pt with RR 12 at rest, 18 end of session    Patient at end of session: All needs in reach, In bed, Lines intact, Notified Nurse     Occupational Therapy Session Duration  OT Co-Treatment [mins]: 38  Reason for Co-treatment: Poor activity tolerance       AM-PAC-Daily Activity  Lower Body Dressing assistance needs: A Little - Minimal/Contact Guard Assist/Supervision  Bathing assistance needs: A lot - Maximum/Moderate Assistance  Toileting assistance needs: A Little - Minimal/Contact Guard Assist/Supervision  Upper Body Dressing assistance needs: A Little - Minimal/Contact Guard Assist/Supervision  Personal Grooming assistance needs: A Little - Minimal/Contact Guard Assist/Supervision  Eating Meals assistance needs: A Little - Minimal/Contact Guard Assist/Supervision    Daily Activity Score: 17    Score (in points): % of Functional Impairment, Limitation, Restriction  6: 100% impaired, limited, restricted  7-8: At least 80%, but less than 100% impaired, limited restricted  9-13: At least 60%, but less than 80% impaired, limited restricted  14-19: At least 40%, but less than 60% impaired, limited restricted  20-22: At least 20%, but less than 40% impaired, limited restricted  23: At least 1%, but less than 20% impaired, limited restricted  24: 0% impaired, limited restricted      I attest that I have reviewed the above information.  Signed: Aleck Locklin, OT  Filed 09/06/2024                 [  1]   Past Medical History:  Diagnosis Date    Cataract 2005    cataract surgery    COPD (chronic obstructive pulmonary disease)    (CMS-HCC) 10/07/2016    DJD (degenerative joint disease), lumbar     Hypertension     Macular degeneration of right eye 10/17/2022    Mild anemia 01/15/2022    Tobacco abuse 02/15/2014   [2]   Past Surgical History:  Procedure Laterality Date    CARDIAC VALVE REPLACEMENT      triple bypass    CATARACT EXTRACTION      CORONARY ARTERY BYPASS GRAFT  2001    x3    EYE SURGERY  2005    cataracts    PR REPAIR ING HERNIA,5+Y/O,REDUCIBL Left 06/19/2015    Procedure: REPAIR INITIAL INGUINAL HERNIA, AGE 45 YEARS OR OLDER; REDUCIBLE;  Surgeon: Curtistine DELENA Poisson, MD;  Location: [facility] OR [facility];  Service: Trauma    PR TEMPORAL ARTERY LIGATN OR BX Left 10/23/2017    Procedure: LIGATION OR BIOPSY, TEMPORAL ARTERY;  Surgeon: Fairy Lum Gaskins, MD;  [facility];  Service: ENT    PR UPPER GI ENDOSCOPY,DIAGNOSIS N/A 12/17/2017    Procedure: UGI ENDO, INCLUDE ESOPHAGUS, STOMACH, & DUODENUM &/OR JEJUNUM; DX W/WO COLLECTION SPECIMN, BY BRUSH OR WASH;  Surgeon: Sean Easterly, MD;  [facility];  Service: Gastroenterology   [3]   Family History  Problem Relation Age of Onset    Thyroid disease Sister     Cancer Brother     Glaucoma Neg Hx     Macular degeneration Neg Hx     Retinal detachment Neg Hx     Substance Abuse Disorder Neg Hx     Mental illness Neg Hx

## 2024-09-06 NOTE — Unmapped (Signed)
 PHYSICAL THERAPY  Evaluation (09/06/24 1407)          Patient Name:  Erik [name]       Medical Record Number: [id number]   Date of Birth: [birth year]  Sex: Male        Post-Discharge Physical Therapy Recommendations:  PT Post Acute Discharge Recommendations: 5x weekly, Low intensity   Equipment Recommendation  PT DME Recommendations: Defer to post acute          Treatment Diagnosis: Abnormalities of gait and mobility, Difficulty in walking, Unsteadiness on feet (decreased endurance)        ASSESSMENT  Problem List: Decreased safety awareness, Impaired balance, Decreased endurance, Decreased mobility, Gait deviation, Fall risk      Assessment : Erik [name] is a 89 y.o. male with past medical history of CAD, s/p 3V CABG 2001, atrial fibrillation diagnosed 07/2024, hypertension, HLD, and COPD who was brought in by ambulance with worsening difficulty breathing.        Session performed as co-treat w/ OT due to poor activity tolerance.   Pt presents to skilled PT services with decreased endurance, impaired balance, and gait deviations, requiring CGA for ambulation on level surface.  Pt limited by fatigue; desat to 88-89% after 10' gait trial. Pt values his independence, strongly declined assistance or use of AD; ambulates w/ crouched posture at baseline.          Therapist reviewed session findings and PT POC/recs. Pt would benefit from continued skilled PT services to address safety, education and deficits. After review of contributing co-morbidities and personal factors, clinical presentation and exam findings, patient demonstrates high complexity for evaluation and development of POC.        Recommend 5x/wk low intensity post-acute stay.  Pt adamantly opposed to rehab, though reported increasing difficulty w/ mobility and managing needs.  Pt reported requiring rest breaks when walking to bathroom, inability to shower, decreased caregiver availability.      Today's Interventions: PT eval, bed mobility, sit<>stand, ambulation, sitting balance, standing balance, monitoring of vitals, educated pt on session findings and PT POC/recs     Personal Factors/Comorbidities Present: 3+ factors   Examination of Body System: Musculoskeletal, Cardiovascular, Pulmonary, Communication, Activity/participation  Clinical Presentation: Unstable    Eval Complexity : High Complexity     Activity Tolerance: Limited by fatigue       PLAN  Planned Frequency of Treatment: Plan of Care Initiated: 09/06/24  1-2x per day Weekly Frequency: 3-4 days per week  Planned Treatment Duration: 09/20/24     Planned Interventions: Education (Patient/Family/Caregiver), Gait training, Home exercise program, Therapeutic Exercise, Therapeutic Activity     Goals:         SHORT GOAL #1: Pt will perform all bed mobility independently               Time Frame : 2 weeks  SHORT GOAL #2: Pt will perform all functional transfers mod I, LRAD              Time Frame : 2 weeks  SHORT GOAL #3: Pt will ambulate >75' Mod I and LRAD              Time Frame : 2 weeks  SHORT GOAL #4: Pt will negotiate 3 steps w/ B rails, Mod I, and LRAD                             Prognosis:  Barriers to Discharge: Inability to safely perform ADLS, Decreased caregiver support, Gait instability, Endurance deficits     SUBJECTIVE        Patient reports: Agreeable to PT  Pain Comments: denied pain        Prior Functional Status: At baseline ambulates w/ flexed posture, using BUE on B knees for stability; refuses assistive device, values his independence.  Living Situation  Living Environment: House  Lives With: Alone  Home Living: One level home, Tub/shower unit, Standard height toilet, Stairs to enter with rails  Rail placement (outside): Bilateral rails in reach  Number of Stairs to Enter (outside): 3  Caregiver Identified?: No  Caregiver Identified?: No   Equipment available at home: Straight cane        Past Medical History[1]         Social History     Tobacco Use    Smoking status: Former Current packs/day: 0.00     Types: Cigarettes    Smokeless tobacco: Never    Tobacco comments:     quit for 25 years, restarted in 2014. Denies any use of tobacco at this time   Substance Use Topics    Alcohol use: Yes     Alcohol/week: 1.0 standard drink of alcohol     Types: 1 Cans of beer per week     Comment: daily       Past Surgical History[2]          Family History[3]     Allergies: Patient has no known allergies.                  Objective Findings  Precautions / Restrictions  Precautions: Falls precautions  Weight Bearing Status: Non-applicable  Required Braces or Orthoses: Non-applicable        Equipment / Environment: Telemetry, Supplemental oxygen , Vascular access (PIV, TLC, Port-a-cath, PICC) (3L supplemental O2)     Vitals/Orthostatics : At rest: 71-115 bpm, 12 RR, 91-96% on 3.0L, 118/44 (MAP 68)  After ambulation 100-127 bpm, 88-89% on 3.0L  End of session: 88 bpm, recovered to 90% on 3.0L, 18 RR     Cognition: WFL  Cognition comment: impulsive  Visual/Perception: Wears Glasses/Contacts all the time  Hearing: No deficit identified           Upper Extremities  UE ROM: Right WFL, Left WFL  UE Strength: Right WFL, Left WFL  UE comment: BUE grossly WFL AROM when tested sitting; however, crouched posture limits functional shoulder mobility    Lower Extremities  LE ROM: Left WFL, Right WFL  LE Strength: Right WFL, Left WFL          Sensation:  (reported intermittent N/T in B hands and feet (chronic))    Static Sitting-Level of Assistance: Supervision  Dynamic Sitting-Level of Assistance: Supervision    Static Standing-Level of Assistance: Stand by assistance  Dynamic Standing - Level of Assistance: Stand by assistance  Standing Balance comments: crouched posture w/ BUE on B knees for support      Bed Mobility: Sit to Supine, Supine to Sit  Supine to Sit assistance level: Standby assist, set-up cues, supervision of patient - no hands on  Sit to Supine assistance level: Standby assist, set-up cues, supervision of patient - no hands on  Bed Mobility comments: Supine<>sit EOB with HOB elevated 45 degrees; SBA. HOB and FOB at home are adjustable.     Transfers: Sit to Stand  Sit to Stand assistance level: Standby assist, set-up cues, supervision of  patient - no hands on  Transfer comments: Sit<>stand w/ SBA; poor eccentric control on descent      Gait Level of Assistance: Standby assist, set-up cues, supervision of patient - no hands on  Gait Distance Ambulated (ft): 10 ft  Skilled Treatment Performed: Pt completed 10' gait trial  with CGA;  demonstrated decreased gait speed and decreased step length, instability and reaching for environmental support. Ambulates w/ crouched, squat like posture w/ BUE support on B knees to relieve chronic back pain; refuses assistance or use of AD.     Stairs: TBA            Endurance: Limited; desat to 88-89% on 3.0L after 10' ambulation    Patient at end of session: All needs in reach, In bed, Lines intact, Notified Nurse    Physical Therapy Session Duration  PT Co-Treatment [mins]: 38 (w/ Jaclyn Jewett, OT)          AM-PAC-6 click  Help currently need turning over In bed?: None - Modified Independent/Independent  Help currently needed sitting down/standing up from chair with arms? : A Little - Minimal/Contact Guard Assist/Supervision  Help currently needed moving from supine to sitting on edge of bed?: A Little - Minimal/Contact Guard Assist/Supervision  Help currently needed moving to and from bed from wheelchair?: A Little - Minimal/Contact Guard Assist/Supervision  Help currently needed walking in a hospital room?: A Little - Minimal/Contact Guard Assist/Supervision  Help currently needed climbing 3-5 steps with railing?: Unable to do/total assistance - Total Dependent Assist    Basic Mobility Score 6 click: 17    6 click Score (in points): % of Functional Impairment, Limitation, Restriction  6: 100% impaired, limited, restricted  7-8: At least 80%, but less than 100% impaired, limited restricted  9-13: At least 60%, but less than 80% impaired, limited restricted  14-19: At least 40%, but less than 60% impaired, limited restricted  20-22: At least 20%, but less than 40% impaired, limited restricted  23: At least 1%, but less than 20% impaired, limited restricted  24: 0% impaired, limited restricted    'AM-PAC' forms are Copyright protected by [employer] [facility]         I attest that I have reviewed the above information.  Signed: Shelda Skeeters, PT  Filed 09/06/2024          [1]   Past Medical History:  Diagnosis Date    Cataract 2005    cataract surgery    COPD (chronic obstructive pulmonary disease)    (CMS-HCC) 10/07/2016    DJD (degenerative joint disease), lumbar     Hypertension     Macular degeneration of right eye 10/17/2022    Mild anemia 01/15/2022    Tobacco abuse 02/15/2014   [2]   Past Surgical History:  Procedure Laterality Date    CARDIAC VALVE REPLACEMENT      triple bypass    CATARACT EXTRACTION      CORONARY ARTERY BYPASS GRAFT  2001    x3    EYE SURGERY  2005    cataracts    PR REPAIR ING HERNIA,5+Y/O,REDUCIBL Left 06/19/2015    Procedure: REPAIR INITIAL INGUINAL HERNIA, AGE 71 YEARS OR OLDER; REDUCIBLE;  Surgeon: Curtistine DELENA Poisson, MD;  Location: [facility] OR [facility];  Service: Trauma    PR TEMPORAL ARTERY LIGATN OR BX Left 10/23/2017    Procedure: LIGATION OR BIOPSY, TEMPORAL ARTERY;  Surgeon: Fairy Lum Gaskins, MD;  Location: [facility] OR [facility];  Service:  ENT    PR UPPER GI ENDOSCOPY,DIAGNOSIS N/A 12/17/2017    Procedure: UGI ENDO, INCLUDE ESOPHAGUS, STOMACH, & DUODENUM &/OR JEJUNUM; DX W/WO COLLECTION SPECIMN, BY BRUSH OR WASH;  Surgeon: Sean Easterly, MD;  [facility];  Service: Gastroenterology   [3]   Family History  Problem Relation Age of Onset    Thyroid disease Sister     Cancer Brother     Glaucoma Neg Hx     Macular degeneration Neg Hx     Retinal detachment Neg Hx     Substance Abuse Disorder Neg Hx     Mental illness Neg Hx

## 2024-09-06 NOTE — Unmapped (Signed)
 Family Medicine Inpatient Progress Note    Assessment & Plan:   Erik [name] is a 89 y.o. male with past medical history of CAD, s/p 3V CABG 2001, atrial fibrillation diagnosed 07/2024, hypertension, HLD, and COPD who was brought in by ambulance with worsening difficulty breathing likely secondary to COPD exacerbation.     Principal Problem:    COPD exacerbation    (CMS-HCC)  Active Problems:    Essential hypertension, benign    Chronic back pain    CAD (coronary artery disease), autologous vein bypass graft    Tobacco use disorder    Chronic pain syndrome    Giant cell arteritis    (CMS-HCC)    Constipation    Dyslipidemia    Macrocytosis without anemia    BPH (benign prostatic hyperplasia)    COPD (chronic obstructive pulmonary disease)    (CMS-HCC)    Atrial fibrillation    (CMS-HCC)        Active Problems    # Acute Hypoxic Respiratory Failure - COPD exacerbation  Hypoxic on arrival with mild increased work of breathing.  Initial presentation concerning for COPD exacerbation vs volume overload.  Patient was placed on BiPAP and had a good response.  His initial workup was notable for a proBNP elevated to greater than 3000 (recent proBNP greater than 6000 during ER visit for new-onset atrial fibrillation), and chest x-ray notable for a small right pleural effusion and bibasilar basilar atelectasis, and EKG demonstrating atrial fibrillation with rapid ventricular rate.  Given furosemide  20 mg IV in the ER and his lung exam not notable for findings of fluid overload as well as diffuse wheezing suggestive of COPD exacerbation.  Lung exam today with clear breath sounds bilaterally.  Trace pitting edema right lower extremity.  Now on 3 L O2.  Increased sputum production and purulence today.  - Continue to wean to RA as tolerated with goal O2 88-92%  - levalbuterol  q6h  - ipratropium q6h  - budesonide  nebs 0.5 bid  - Prednisone  40 mg daily   - Will defer diuresis today given lack of hypervolemia on exam  - start doxycycline  x5 days for purulent sputum    # Atrial fibrillation   Patient taking both atenolol  50 mg daily and metoprolol  tartrate 25 mg tid for the past 2 weeks.  He is appropriately anticoagulated with apixaban .  He was scheduled for an outpatient cardioversion later this week.  No clear evidence of volume overload; weight stable, no rales on exam.  There is elevated JVD but question significance with no recent TTE.  - Increase metoprolol  tartrate to 50 mg q6h per cardiology rec  - Continue apixaban  2.5 mg twice daily  - Plan for cardioversion once respiratory status improved, NPO at midnight  - F/up echo    # R groin mass  - Mass with venous flow noted on a recent outpatient LE doppler  - Patient without symptoms on admission  - Defer additional imaging for present    # Constipation: Miralax , Senna prn     # L eye scleral hematoma  Likely secondary to trauma from hitting left eye with nasal cannula.  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  No pain with extraocular movements.  No drainage.  Hematoma only involving sclera.  - Continue to monitor    Chronic Problems    # HTN  Patient with multiple BP agents outpatient held since admission for soft blood pressures.  - Home regimen:  amlodipine  10 mg daily, atenolol  50  mg daily, losartan  50 mg daily, hydralazine  25 mg daily, furosemide  10-20 mg daily prn  - Inpatient regimen:  metoprolol  12.5 mg qid  - 09/05/24:  will plan to resume furosemide  20 mg daily  - Recommend resuming amlodipine  at 5 mg daily, discontinuing hydralazine      # HLD  - Pravastatin  for home simvastatin  10 mg daily while admitted     # RLE arterial stenosis  - recent venous Doppler suggested an arterial stenosis, however, the patient is asymptomatic I do not think pursuing PAD in his right lower extremity should be high on his priority list    # Back pain: continue home percocet  2.5-125 mg q6h prn         Daily Checklist:  Diet: Regular Diet, NPO at midnight  DVT PPx: Patient Already on Full Anticoagulation with eliquis   Electrolytes: Replete Potassium to >/=4 and Magnesium  to >/=2  Code Status: Full Code  Dispo: Admit to stepdown    Team Contact Information:   Primary Team: [facility]  Primary Resident: Alyce SHAUNNA Pipes, MD  Resident's Pager: [facility] [phone])    Interval History:   No acute events overnight.  Breathing better today.  Significant sputum production this morning.  Reports new pain all over.  New left eye pain secondary to hitting his eye with nasal cannula overnight.  Otherwise no complaints today.    All other systems were reviewed and are negative except as noted in the HPI    Objective:   Temp:  [36.6 ??C (97.8 ??F)-37 ??C (98.6 ??F)] 36.8 ??C (98.2 ??F)  Pulse:  [92-117] 112  SpO2 Pulse:  [113-139] 139  Resp:  [11-19] 12  BP: (120-142)/(54-79) 142/79  SpO2:  [93 %-96 %] 93 %    Gen: NAD, converses   HENT: normocephalic, left eye scleral hematoma  Heart: Irregularly irregular rhythm, tachycardic  Lungs: CTAB, no crackles or wheezes  Abdomen: soft, NTND  Extremities: Trace pitting edema right lower extremity    Alyce Pipes, MD PGY2

## 2024-09-06 NOTE — Unmapped (Signed)
 Care Management  Initial Transition Planning Assessment    CM spoke with patient at bedside to explain role and transition of care. CM spoke with patient regarding home care services if recommended, patient states I would go to a nursing home before I do that. Patient lives alone in private home with 3 steps to enter and resides on main floor. Patient was fully independent prior to hospitalization. No care giver or home care services identified. DMEs in home include cane. Grandchild will transport patient home when ready for discharge.    Type of Residence: Mailing Address:  [address]  [place] [address]  Contacts: Accompanied by: MARYLN) EMS personnel ([facility] from home.)  Patient Phone Number: [phone] (home)          Medical Provider(s): Mangel, Benison Pap, DO  Reason for Admission: Admitting Diagnosis:  Acute respiratory failure with hypoxia    (CMS-HCC) [J96.01]  Past Medical History:   has a past medical history of Cataract (2005), COPD (chronic obstructive pulmonary disease)    (CMS-HCC) (10/07/2016), DJD (degenerative joint disease), lumbar, Hypertension, Macular degeneration of right eye (10/17/2022), Mild anemia (01/15/2022), and Tobacco abuse (02/15/2014).  Past Surgical History:   has a past surgical history that includes Coronary artery bypass graft (2001); Eye surgery (2005); pr repair ing hernia,5+y/o,reducibl (Left, 06/19/2015); Cataract extraction; pr temporal artery ligatn or bx (Left, 10/23/2017); Cardiac valve replacement; and pr upper gi endoscopy,diagnosis (N/A, 12/17/2017).   Previous admit date: 12/15/2017    Primary Insurance- Payor: Advertising copywriter MEDICARE ADV / Plan: Advertising copywriter MEDICARE ADV / Product Type: *No Product type* /   Secondary Insurance - None  Prescription Coverage -   Preferred Pharmacy - [id number] - [place], [place] - [address]  [id number] - [place], [place] - [address] [place] [address]  [facility] [place] - [address]  [facility]    Transportation home: Probation officer / Social Worker assessed the patient by : In person interview with patient  Orientation Level: Oriented X4  Functional level prior to admission: Independent  Reason for referral: Discharge Planning    Contact/Decision Maker  Extended Emergency Contact Information  Primary Emergency Contact: Bridgers,Lamoine   [place]  Home Phone: [phone]  Relation: Son  Secondary Emergency Contact: Koziel,Randy   [place]  Home Phone: [phone]  Relation: Son    Armed forces operational officer Next of Kin / Guardian / POA / Advance Directives     HCDM (patient stated preference): Jimmie, Rueter [phone]    Advance Directive (Medical Treatment)  Does patient have an advance directive covering medical treatment?: Patient does not have advance directive covering medical treatment.  Reason patient does not have an advance directive covering medical treatment:: Patient does not wish to complete one at this time.    Health Care Decision Maker [HCDM] (Medical & Mental Health Treatment)  Healthcare Decision Maker: Patient does not wish to appoint a Health Care Decision Maker at this time  Information offered on HCDM, Medical & Mental Health advance directives:: Patient declined information.    Advance Directive (Mental Health Treatment)  Does patient have an advance directive covering mental health treatment?: Patient does not have advance directive covering mental health treatment.  Reason patient does not have an advance directive covering mental health treatment:: Patient does not wish to complete  one at this time.    Readmission Information    Have you been hospitalized in the last 30 days?: No    Did the following happen with your discharge?    Patient Information  Lives with: Alone    Type of Residence: Private residence    Location/Detail: [place]    Support Systems/Concerns: Family Members    Responsibilities/Dependents at home?: No    Home Care services in place prior to admission?: No    Outpatient/Community Resources in place prior to admission: Clinic       Equipment Currently Used at Home: cane, straight       Currently receiving outpatient dialysis?: No       Financial Information       Need for financial assistance?: No       Social Determinants of Health  Social Determinants of Health were addressed in provider documentation.  Please refer to patient history.  Social Drivers of Health     Food Insecurity: No Food Insecurity (06/08/2024)    Hunger Vital Sign     Worried About Running Out of Food in the Last Year: Never true     Ran Out of Food in the Last Year: Never true   Tobacco Use: Medium Risk (08/17/2024)    Patient History     Smoking Tobacco Use: Former     Smokeless Tobacco Use: Never     Passive Exposure: Not on file   Transportation Needs: No Transportation Needs (06/08/2024)    PRAPARE - Therapist, art (Medical): No     Lack of Transportation (Non-Medical): No   Alcohol Use: Not At Risk (06/08/2024)    Alcohol Use     How often do you have a drink containing alcohol?: Never     How many drinks containing alcohol do you have on a typical day when you are drinking?: Not on file     How often do you have 5 or more drinks on one occasion?: Never   Housing: Low Risk  (06/08/2024)    Housing     Within the past 12 months, have you ever stayed: outside, in a car, in a tent, in an overnight shelter, or temporarily in someone else's home (i.e. couch-surfing)?: No     Are you worried about losing your housing?: No   Physical Activity: Inactive (06/08/2024)    Exercise Vital Sign     Days of Exercise per Week: 0 days     Minutes of Exercise per Session: 0 min   Utilities: Low Risk  (06/08/2024)    Utilities     Within the past 12 months, have you been unable to get utilities (heat, electricity) when it was really needed?: No   Stress: No Stress Concern Present (06/08/2024)    [employer] of Occupational Health - Occupational Stress Questionnaire     Feeling of Stress: Not at all   Interpersonal Safety: Not At Risk (09/04/2024)    Interpersonal Safety     Unsafe Where You Currently Live: No     Physically Hurt by Anyone: No     Abused by Anyone: No   Substance Use: Not on file (06/20/2024)   Intimate Partner Violence: Not At Risk (06/08/2024)    Humiliation, Afraid, Rape, and Kick questionnaire     Fear of Current or Ex-Partner: No     Emotionally Abused: No     Physically Abused: No     Sexually Abused: No  Social Connections: Socially Isolated (06/08/2024)    Social Connection and Isolation Panel     Frequency of Communication with Friends and Family: More than three times a week     Frequency of Social Gatherings with Friends and Family: Twice a week     Attends Religious Services: Never     Database administrator or Organizations: No     Attends Banker Meetings: Never     Marital Status: Widowed   Physicist, medical Strain: Low Risk  (06/08/2024)    Overall Financial Resource Strain (CARDIA)     Difficulty of Paying Living Expenses: Not hard at all   Health Literacy: Low Risk  (06/08/2024)    Health Literacy     : Never   Internet Connectivity: Internet connectivity concern identified (06/08/2024)    Internet Connectivity     Do you have access to internet services: No     How do you connect to the internet: Not on file     Is your internet connection strong enough for you to watch video on your device without major problems?: Not on file     Do you have enough data to get through the month?: Not on file     Does at least one of the devices have a camera that you can use for video chat?: Not on file       Complex Discharge Information    Is patient identified as a difficult/complex discharge?: No    Interventions:       Discharge Needs Assessment  Concerns to be Addressed: discharge planning    Clinical Risk Factors: > 65, New Diagnosis    Barriers to taking medications: No    Prior overnight hospital stay or ED visit in last 90 days: No      Anticipated Changes Related to Illness: none    Equipment Needed After Discharge: other (see comments) (TBD)    Discharge Facility/Level of Care Needs: other (see comments) (TBD)    Readmission  Risk of Unplanned Readmission Score: UNPLANNED READMISSION SCORE: 15.94%  Predictive Model Details          16% (Medium)  Factor Value    Calculated 09/06/2024 12:04 22% Number of active inpatient medication orders 28    [facility] Risk of Unplanned Readmission Model 12% Number of ED visits in last six months 2     11% ECG/EKG order present in last 6 months     8% Age 35     7% Imaging order present in last 6 months     7% Latest hemoglobin low (10.9 g/dL)     7% Phosphorous result present     7% Charlson Comorbidity Index 5     5% Diagnosis of deficiency anemia present     5% Active anticoagulant inpatient medication order present     5% Active corticosteroid inpatient medication order present     2% Current length of stay 2.028 days     2% Future appointment scheduled     1% Active ulcer inpatient medication order present      Readmitted Within the Last 30 Days? (No if blank)   Patient at risk for readmission?: No    Discharge Plan  Screen findings are: Discharge planning needs identified or anticipated (Comment). (TBD)    Expected Discharge Date:     Expected Transfer from Critical Care:  (N/A)    Quality data for continuing care services shared with patient and/or representative?: Yes  Patient  and/or family were provided with choice of facilities / services that are available and appropriate to meet post hospital care needs?: Yes   List choices in order highest to lowest preferred, if applicable. : no pref    Initial Assessment complete?: Yes

## 2024-09-07 LAB — CBC
HEMATOCRIT: 30.3 % — ABNORMAL LOW (ref 39.0–48.0)
HEMOGLOBIN: 10.5 g/dL — ABNORMAL LOW (ref 12.9–16.5)
MEAN CORPUSCULAR HEMOGLOBIN CONC: 34.9 g/dL (ref 32.0–36.0)
MEAN CORPUSCULAR HEMOGLOBIN: 33.9 pg — ABNORMAL HIGH (ref 25.9–32.4)
MEAN CORPUSCULAR VOLUME: 97.2 fL — ABNORMAL HIGH (ref 77.6–95.7)
MEAN PLATELET VOLUME: 7.5 fL (ref 6.8–10.7)
PLATELET COUNT: 181 10*9/L (ref 150–450)
RED BLOOD CELL COUNT: 3.11 10*12/L — ABNORMAL LOW (ref 4.26–5.60)
RED CELL DISTRIBUTION WIDTH: 14.2 % (ref 12.2–15.2)
WBC ADJUSTED: 10.2 10*9/L (ref 3.6–11.2)

## 2024-09-07 LAB — BASIC METABOLIC PANEL
ANION GAP: 10 mmol/L (ref 5–14)
BLOOD UREA NITROGEN: 20 mg/dL (ref 9–23)
BUN / CREAT RATIO: 18
CALCIUM: 8.9 mg/dL (ref 8.7–10.4)
CHLORIDE: 98 mmol/L (ref 98–107)
CO2: 25.7 mmol/L (ref 20.0–31.0)
CREATININE: 1.12 mg/dL (ref 0.73–1.18)
EGFR CKD-EPI (2021) MALE: 63 mL/min/1.73m2 (ref >=60)
GLUCOSE RANDOM: 118 mg/dL (ref 70–179)
POTASSIUM: 4.6 mmol/L (ref 3.4–4.8)
SODIUM: 134 mmol/L — ABNORMAL LOW (ref 135–145)

## 2024-09-07 LAB — MAGNESIUM: MAGNESIUM: 1.9 mg/dL (ref 1.6–2.6)

## 2024-09-07 MED ADMIN — pravastatin (PRAVACHOL) tablet 20 mg: 20 mg | ORAL

## 2024-09-07 MED ADMIN — ipratropium (ATROVENT) 0.02 % nebulizer solution 500 mcg: 500 ug | RESPIRATORY_TRACT | @ 09:00:00

## 2024-09-07 MED ADMIN — ipratropium (ATROVENT) 0.02 % nebulizer solution 500 mcg: 500 ug | RESPIRATORY_TRACT | @ 18:00:00

## 2024-09-07 MED ADMIN — magnesium oxide (MAG-OX) tablet 400 mg: 400 mg | ORAL | @ 16:00:00 | Stop: 2024-09-07

## 2024-09-07 MED ADMIN — doxycycline (VIBRA-TABS) tablet 100 mg: 100 mg | ORAL | @ 10:00:00 | Stop: 2024-09-11

## 2024-09-07 MED ADMIN — calcium carbonate tablet 600 mg elem calcium: 600 mg | ORAL

## 2024-09-07 MED ADMIN — doxycycline (VIBRA-TABS) tablet 100 mg: 100 mg | ORAL | @ 22:00:00 | Stop: 2024-09-11

## 2024-09-07 MED ADMIN — ipratropium (ATROVENT) 0.02 % nebulizer solution 500 mcg: 500 ug | RESPIRATORY_TRACT | @ 01:00:00

## 2024-09-07 MED ADMIN — metoPROLOL tartrate (Lopressor) tablet 50 mg: 50 mg | ORAL | @ 10:00:00

## 2024-09-07 MED ADMIN — ipratropium (ATROVENT) 0.02 % nebulizer solution 500 mcg: 500 ug | RESPIRATORY_TRACT | @ 12:00:00

## 2024-09-07 MED ADMIN — predniSONE (DELTASONE) tablet 40 mg: 40 mg | ORAL | @ 13:00:00 | Stop: 2024-09-09

## 2024-09-07 MED ADMIN — calcium carbonate tablet 600 mg elem calcium: 600 mg | ORAL | @ 13:00:00

## 2024-09-07 MED ADMIN — ascorbic acid (vitamin C) (VITAMIN C) tablet 500 mg: 500 mg | ORAL | @ 13:00:00

## 2024-09-07 MED ADMIN — tamsulosin (FLOMAX) 24 hr capsule 0.4 mg: .4 mg | ORAL | @ 13:00:00

## 2024-09-07 MED ADMIN — pantoprazole (Protonix) EC tablet 40 mg: 40 mg | ORAL | @ 13:00:00

## 2024-09-07 MED ADMIN — cyanocobalamin (vitamin B-12) tablet 1,000 mcg: 1000 ug | ORAL | @ 13:00:00

## 2024-09-07 MED ADMIN — levalbuterol (XOPENEX) nebulizer solution 0.63 mg: .63 mg | RESPIRATORY_TRACT | @ 09:00:00 | Stop: 2024-09-12

## 2024-09-07 MED ADMIN — oxyCODONE-acetaminophen (PERCOCET) 5-325 mg tablet 0.5 tablet: .5 | ORAL | @ 19:00:00 | Stop: 2024-09-18

## 2024-09-07 MED ADMIN — apixaban (ELIQUIS) tablet 2.5 mg: 2.5 mg | ORAL | @ 13:00:00

## 2024-09-07 MED ADMIN — metoPROLOL tartrate (Lopressor) tablet 50 mg: 50 mg | ORAL | @ 16:00:00

## 2024-09-07 MED ADMIN — levalbuterol (XOPENEX) nebulizer solution 0.63 mg: .63 mg | RESPIRATORY_TRACT | @ 12:00:00 | Stop: 2024-09-12

## 2024-09-07 MED ADMIN — metoPROLOL tartrate (Lopressor) tablet 50 mg: 50 mg | ORAL | @ 04:00:00

## 2024-09-07 MED ADMIN — levalbuterol (XOPENEX) nebulizer solution 0.63 mg: .63 mg | RESPIRATORY_TRACT | @ 01:00:00 | Stop: 2024-09-08

## 2024-09-07 MED ADMIN — budesonide (PULMICORT) nebulizer solution 0.5 mg: .5 mg | RESPIRATORY_TRACT | @ 01:00:00

## 2024-09-07 MED ADMIN — budesonide (PULMICORT) nebulizer solution 0.5 mg: .5 mg | RESPIRATORY_TRACT | @ 12:00:00

## 2024-09-07 MED ADMIN — apixaban (ELIQUIS) tablet 2.5 mg: 2.5 mg | ORAL

## 2024-09-07 MED ADMIN — levalbuterol (XOPENEX) nebulizer solution 0.63 mg: .63 mg | RESPIRATORY_TRACT | @ 18:00:00 | Stop: 2024-09-12

## 2024-09-07 MED ADMIN — oxyCODONE-acetaminophen (PERCOCET) 5-325 mg tablet 0.5 tablet: .5 | ORAL | @ 04:00:00 | Stop: 2024-09-18

## 2024-09-07 MED ADMIN — aluminum-magnesium hydroxide-simethicone (MAALOX MAX) 80-80-8 mg/mL oral suspension: 30 mL | ORAL | @ 16:00:00

## 2024-09-07 MED ADMIN — metoPROLOL tartrate (Lopressor) tablet 50 mg: 50 mg | ORAL | @ 22:00:00

## 2024-09-07 NOTE — Unmapped (Signed)
 Problem: Adult Inpatient Plan of Care  Goal: Plan of Care Review  Outcome: Ongoing - Unchanged   Pt stable through night on 3L [place] and received all tx's as scheduled.

## 2024-09-07 NOTE — Unmapped (Signed)
 Shift Summary  Oxygen  weaned to RA, Patient tolerating well, SPO2 88-92%.   Patient refused PT/OT multiple times, refused turns, remaining in bed and requiring ongoing fall prevention measures.   PRN pain medication was administered in the afternoon for increased pain, which later resolved.   Patient remained cooperative, pleasant, and oriented, with no acute changes in mood or cognition.       Absence of Fall and Fall-Related Injury: Fall prevention interventions were consistently maintained throughout the shift, including bed alarm, low bed, and frequent visual checks; patient remained in bed and refused activity, with no reported falls or injuries.     Optimal Gas Exchange: Oxygen  saturation decreased over the shift, with SpO2 dropping from 97% to 88% despite adjustments in oxygen  flow rate and device use; breath sounds remained rhonchi bilaterally, and patient required humidified oxygen  via nasal cannula.     Optimal Chronic Illness Coping: Patient remained cooperative and pleasant, oriented to person, place, time, and situation, but refused activity multiple times during the shift.     Absence of Infection Signs and Symptoms: Temperature remained within normal limits and skin color was appropriate for ethnicity, with no new documentation of infection-related symptoms.

## 2024-09-07 NOTE — Unmapped (Signed)
 Family Medicine Inpatient Progress Note    Assessment & Plan:   Erik [name] is a 89 y.o. male with past medical history of CAD, s/p 3V CABG 2001, atrial fibrillation diagnosed 07/2024, hypertension, HLD, and COPD who was brought in by ambulance with worsening difficulty breathing likely secondary to COPD exacerbation.     Principal Problem:    COPD exacerbation    (CMS-HCC)  Active Problems:    Essential hypertension, benign    Chronic back pain    CAD (coronary artery disease), autologous vein bypass graft    Tobacco use disorder    Chronic pain syndrome    Giant cell arteritis    (CMS-HCC)    Constipation    Dyslipidemia    Macrocytosis without anemia    BPH (benign prostatic hyperplasia)    COPD (chronic obstructive pulmonary disease)    (CMS-HCC)    Atrial fibrillation    (CMS-HCC)    Active Problems    #Acute Hypoxic Respiratory Failure - COPD exacerbation  Hypoxic on arrival with mild increased work of breathing.  Initial presentation concerning for COPD exacerbation vs volume overload.  Patient was placed on BiPAP and had a good response.  His initial workup was notable for a proBNP elevated to greater than 3000 (recent proBNP greater than 6000 during ER visit for new-onset atrial fibrillation), and chest x-ray notable for a small right pleural effusion and bibasilar basilar atelectasis, and EKG demonstrating atrial fibrillation with rapid ventricular rate.  Given furosemide  20 mg IV in the ER and his lung exam not notable for findings of fluid overload as well as diffuse wheezing suggestive of COPD exacerbation. Lung exam today with clear breath sounds bilaterally.   Now on 3 L O2.  Continues to have significant sputum production and purulence, improved with breathing tx.  - Continue to wean to RA (goal O2 88-92%)  - Levalbuterol  q6h  - Ipratropium q6h  - Budesonide  nebs 0.5 bid  - Prednisone  40 mg daily   - Defer diuresis today  - Doxycycline  for 5 days (9/15- ) for purulent sputum    #Atrial fibrillation Patient taking both atenolol  50 mg daily and metoprolol  tartrate 25 mg tid for the past 2 weeks. He is appropriately anticoagulated with apixaban . He was scheduled for an outpatient cardioversion later this week. No clear evidence of volume overload; weight stable, no rales on exam. Prelim echo read: nl LV, EF >55%, mild pulm HTN, nl RV, mildly elev RA pressure.  - Metoprolol  tartrate 50 mg q6h   - Apixaban  2.5 mg twice daily  - Plan for cardioversion once respiratory status improved  - F/up final echo read    #R groin mass  Mass with venous flow noted on a recent outpatient LE doppler, continues to be asymptomatic. Defer additional imaging for present.    #Constipation: Miralax , Senna prn     # L eye scleral hematoma  Likely secondary to trauma from hitting left eye with nasal cannula.  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  No pain with extraocular movements.  No drainage.  Hematoma only involving sclera.  - Continue to monitor    Chronic Problems    # HTN  Patient with multiple BP agents outpatient held since admission for soft blood pressures.  - Home regimen:  amlodipine  10 mg daily, atenolol  50 mg daily, losartan  50 mg daily, hydralazine  25 mg daily, furosemide  10-20 mg daily prn  - Inpatient regimen:  metoprolol  12.5 mg qid  - 09/05/24:  will  plan to resume furosemide  20 mg daily  - Recommend resuming amlodipine  at 5 mg daily, discontinuing hydralazine      # HLD  - Pravastatin  for home simvastatin  10 mg daily while admitted     # RLE arterial stenosis  Recent venous doppler suggested an arterial stenosis, currently asymptomatic.    # Back pain  - Home percocet  2.5-125 mg q6h prn     Daily Checklist:  Diet: Regular Diet, NPO at midnight  DVT PPx: Patient Already on Full Anticoagulation with eliquis   Electrolytes: Replete Potassium to >/=4 and Magnesium  to >/=2  Code Status: Full Code  Dispo: floor     Team Contact Information:   Primary Team: [facility]  Primary Resident: Alyce Pipes  Resident's Pager: [facility] [phone])    Interval History:   No acute events overnight. Slept poorly due to coughing. Breathing better today. Significant sputum production this morning.    All other systems were reviewed and are negative except as noted in the HPI    Objective:   Temp:  [36.3 ??C (97.3 ??F)-37 ??C (98.6 ??F)] 37 ??C (98.6 ??F)  Pulse:  [81-126] 88  SpO2 Pulse:  [63-139] 80  Resp:  [12-20] 19  BP: (110-142)/(61-79) 141/62  SpO2:  [91 %-96 %] 95 %    Gen: NAD, converses   HENT: normocephalic, left eye scleral hematoma  Heart: Irregularly irregular rhythm, tachycardic  Lungs: CTAB, no crackles or wheezes  Abdomen: soft, NTND  Extremities: No lower extremity edema    Erik [name], MS3    I attest that I have reviewed the student note, and that the components of the history of the present illness, the physical exam, and the assessment and plan documented were performed by me or were performed in my presence by the student where I verified the documentation and performed (or re-performed) the exam and medical decision making.     Alyce Pipes, MD PGY2

## 2024-09-07 NOTE — Unmapped (Signed)
 Cardiology Consult Note  [facility] Cardiology Service    Requesting Attending Physician :  Darice Adrien Shams, MD  Service Requesting Consult : [facility] - [facility] [name]    Date of Service: September 07, 2024  Code Status:   Orders Placed This Encounter   Procedures    Full Code     Standing Status:   Standing     Number of Occurrences:   1        ID: Erik [name] is a 89 y.o. male with a past medical history significant for coronary artery disease s/p CABG, persistent atrial fibrillation (on eliquis ), hypertension, hyperlipidemia, and COPD who is currently admitted for  worsening dyspnea; found to be have COPD exacerbation. Cardiology consulted to assist with the evaluation and management of persistent atrial fibrillation.      Afib with HR in the 80-90;s bpm  Bp 130's mmHg systolic       Assessment/Plan:   # Persistent atrial fibrillation  Recent diagnosis of persistent atrial fibrillation, on anticoagulation with apixaban  for three weeks (since 8/26). Initially taking both metoprolol  and atenolol  due to confusion, but atenolol  has been held. He continues to be in afib with rates now controlled in the 80-90's bpm. Currently on metoprolol  50 mg Q6 hours for rate control and he is tolerating this well. He continues to be on 3L of supplemental O2 at this time, but is on room air at baseline.  His TTE from yesterday reveals normal LV systolic function with estimated EF of >55% with mild to moderate pulmonary hypertension.   - Recommend weaning O2 today and planning for DCCV tomorrow 9/17. Please keep NPO at midnight tonight.   - continue metoprolol  tartrate 50 mg Q6 hours for today; hold the 0600 dose of metoprolol  tomorrow  9/17 in prep for DCCV.   - continue Eliquis  2.5 mg twice daily for anticoagulation.   - Continuous telemetry monitoring.  - Trend BMP; Replace electrolytes per protocol in order to maintain K >/=4 and magnesium  >/=2.       # Acute respiratory failure with hypoxia and COPD exacerbation  Continues to have dyspnea requiring 3 L via [place] but appears to have improved overnight. Dyspnea likely due to a COPD exacerbation and a component of PHTN; patient does not appear to be volume overloaded at this time. His Portable chest x-ray from 9/13 showed new small bilateral pleural effusions with adjacent atelectasis and a moderate hiatal hernia.  - COPD exacerbation management per primary team.        # Coronary artery disease, status post CABG  Coronary artery disease, underwent CABG in 2001. High sensitivity troponins were stable at 13, 14, and 15. Patient is without chest pain or anginal symptoms.     - Continue formulary pravastatin  20 mg daily.    - Aspirin  contraindicated at this time due to patient being on DOAC.            Thank you for consulting the medicine Cardiology team. Recommendations communicated via face to face and epic chat.  Please do not hesitate to contact me with any questions.          Subjective:     24 hour events  No acute events overnight    The patient states that he is feeling okay at this time and states that his dyspnea symptoms have improved overnight.  However, he continues to have a congested cough with sputum production that is yellow/green and frequent urination which is his biggest  complaint at this time.  He denies having any chest pain, palpitations, dizziness or syncopal episodes.  He is in agreement with the current plan of cardiology care and would like to proceed with cardioversion on tomorrow as tentatively scheduled.    Allergies:   Allergies[1]    Medications:   Scheduled Meds:Scheduled Medications[2]    Code Status:  Full Code    Review of Systems: 10 point review negative except for stated above    Objective:     Physical Exam:  VITAL SIGNS: Temp:  [36.3 ??C (97.3 ??F)-37 ??C (98.6 ??F)] 37 ??C (98.6 ??F)  Pulse:  [81-126] 88  SpO2 Pulse:  [63-126] 80  Resp:  [13-20] 19  BP: (110-141)/(61-77) 141/62  SpO2:  [91 %-96 %] 95 %    Physical Exam   Constitutional: He appears chronically ill and acutely ill.   HENT:   Nose: Nose normal. Mouth/Throat: Oropharynx is clear.   Eyes: Pupils are equal, round, and reactive to light.   Erythema of the sclera of the left eye    Neck: No JVD present.   Cardiovascular: S1 normal and S2 normal. An irregularly irregular rhythm present. Tachycardia present.   Pulses:       Radial pulses are 2+ on the right side and 2+ on the left side.   Pulmonary/Chest: Increased effort noted. He exhibits no tenderness.   Clear upon auscultation in upper lobes; diminished breath sounds in lower lobes bilaterally.  chest expansion symmetrical    Abdominal: Soft. Bowel sounds are normal. He exhibits no distension.   Musculoskeletal:         General: No edema. Normal range of motion.      Cervical back: Normal range of motion and neck supple.   Neurological: He is alert and oriented to person, place, and time.   Skin: Skin is warm and dry.          Relative labs: LABS  Troponin: 13, 14, 15  Venous lactate: 2.5  ProBNP: 3415     Test Results  ECG: (most recent ECG personally reviewed) atrial fibrillation with RVR, right axis deviation    Echocardiogram: 09/06/2024  Summary    1. Technically difficult study.    2. The left ventricular systolic function is normal, LVEF is visually  estimated at > 55%.    3. There is mild-moderate pulmonary hypertension.    4. The right ventricle is mildly dilated in size, with normal systolic  function.    5. TR maximum velocity: 2.9 m/s  Estimated PASP: 50 mmHg.    6. IVC size and inspiratory change suggest elevated right atrial pressure.  (10-20 mmHg).       CXR: 09/04/2024  Impression  Interval new small bilateral pleural effusion with adjacent atelectasis/airspace disease.  Moderate hiatal hernia.        Echocardiogram: 11/2017  Interpretation Summary  ? Technically difficult study due to chest wall/lung interference  ? Normal left ventricular systolic function, ejection fraction > 55%  ? Normal right ventricular systolic function      CABG performed in 2001       Nuclear Stress:none on file   Cath: none on file    Labs:   Lab Results   Component Value Date    TROPONINI 15 09/04/2024    TROPONINI 14 09/04/2024    TROPONINI 13 09/04/2024     Results for orders placed or performed during the hospital encounter of 09/04/24   Comprehensive metabolic panel   Result Value  Ref Range    Sodium 136 135 - 145 mmol/L    Potassium      Chloride 102 98 - 107 mmol/L    CO2 19.4 (L) 20.0 - 31.0 mmol/L    Anion Gap 15 (H) 5 - 14 mmol/L    BUN      Creatinine      eGFR CKD-EPI (2021) Male      Glucose 123 70 - 179 mg/dL    Calcium  8.5 (L) 8.7 - 10.4 mg/dL    Albumin 3.4 3.4 - 5.0 g/dL    Total Protein 5.9 5.7 - 8.2 g/dL    Total Bilirubin 0.7 0.3 - 1.2 mg/dL    AST 39 (H) <=65 U/L    ALT 16 10 - 49 U/L    Alkaline Phosphatase 44 (L) 46 - 116 U/L   Pro-BNP   Result Value Ref Range    PRO-BNP 3,415.0 (H) <=300.0 pg/mL   hsTroponin I (serial 0-2-6H w/ delta)   Result Value Ref Range    hsTroponin I 13 <=53 ng/L   Magnesium  Level   Result Value Ref Range    Magnesium  1.9 1.6 - 2.6 mg/dL   Phosphorus Level   Result Value Ref Range    Phosphorus 2.4 2.4 - 5.1 mg/dL   Blood Gas, Venous   Result Value Ref Range    Specimen Source Venous     FIO2 Venous Not Specified     pH, Venous 7.36 7.32 - 7.43    pCO2, Ven 40 40 - 60 mm Hg    pO2, Ven 37 35 - 40 mm Hg    HCO3, Ven 22 22 - 27 mmol/L    Base Excess, Ven -2.5 (L) -2.0 - 2.0    O2 Saturation, Venous 67.7 40.0 - 85.0 %   hsTroponin I - 2 Hour   Result Value Ref Range    hsTroponin I 14 <=53 ng/L    delta hsTroponin I 1 <=7 ng/L   Basic metabolic panel   Result Value Ref Range    Sodium 136 135 - 145 mmol/L    Potassium 4.3 3.4 - 4.8 mmol/L    Chloride 100 98 - 107 mmol/L    CO2 19.4 (L) 20.0 - 31.0 mmol/L    Anion Gap 17 (H) 5 - 14 mmol/L    BUN 12 9 - 23 mg/dL    Creatinine 8.82 9.26 - 1.18 mg/dL    BUN/Creatinine Ratio 10     eGFR CKD-EPI (2021) Male 60 >=60 mL/min/1.46m2    Glucose 128 70 - 179 mg/dL Calcium  8.9 8.7 - 10.4 mg/dL   Blood Gas, Venous   Result Value Ref Range    Specimen Source Venous     FIO2 Venous 45%     pH, Venous 7.36 7.32 - 7.43    pCO2, Ven 37 (L) 40 - 60 mm Hg    pO2, Ven 42 (H) 35 - 40 mm Hg    HCO3, Ven 20 (L) 22 - 27 mmol/L    Base Excess, Ven -5.0 (L) -2.0 - 2.0    O2 Saturation, Venous 75.8 40.0 - 85.0 %   Lactate, Venous, Whole Blood   Result Value Ref Range    Lactate, Venous 2.5 (H) 0.5 - 1.8 mmol/L   hsTroponin I - 6 Hour   Result Value Ref Range    hsTroponin I 15 <=53 ng/L    delta hsTroponin I 1 <=7 ng/L   Basic Metabolic Panel  Result Value Ref Range    Sodium 133 (L) 135 - 145 mmol/L    Potassium 4.8 3.5 - 5.1 mmol/L    Chloride 95 (L) 98 - 107 mmol/L    CO2 23.1 20.0 - 31.0 mmol/L    Anion Gap 15 (H) 5 - 14 mmol/L    BUN 27 (H) 9 - 23 mg/dL    Creatinine 8.78 (H) 0.73 - 1.18 mg/dL    BUN/Creatinine Ratio 22     eGFR CKD-EPI (2021) Male 58 (L) >=60 mL/min/1.1m2    Glucose 151 70 - 179 mg/dL    Calcium  9.9 8.7 - 10.4 mg/dL   CBC   Result Value Ref Range    WBC 11.3 (H) 3.6 - 11.2 10*9/L    RBC 3.28 (L) 4.26 - 5.60 10*12/L    HGB 10.9 (L) 12.9 - 16.5 g/dL    HCT 68.1 (L) 60.9 - 48.0 %    MCV 97.1 (H) 77.6 - 95.7 fL    MCH 33.2 (H) 25.9 - 32.4 pg    MCHC 34.1 32.0 - 36.0 g/dL    RDW 85.8 87.7 - 84.7 %    MPV 7.4 6.8 - 10.7 fL    Platelet 204 150 - 450 10*9/L   Basic Metabolic Panel   Result Value Ref Range    Sodium 135 135 - 145 mmol/L    Potassium 4.7 3.4 - 4.8 mmol/L    Chloride 98 98 - 107 mmol/L    CO2 23.4 20.0 - 31.0 mmol/L    Anion Gap 14 5 - 14 mmol/L    BUN 18 9 - 23 mg/dL    Creatinine 8.85 9.26 - 1.18 mg/dL    BUN/Creatinine Ratio 16     eGFR CKD-EPI (2021) Male 62 >=60 mL/min/1.5m2    Glucose 120 70 - 179 mg/dL    Calcium  9.7 8.7 - 10.4 mg/dL   CBC   Result Value Ref Range    WBC 11.8 (H) 3.6 - 11.2 10*9/L    RBC 3.23 (L) 4.26 - 5.60 10*12/L    HGB 10.9 (L) 12.9 - 16.5 g/dL    HCT 68.6 (L) 60.9 - 48.0 %    MCV 96.8 (H) 77.6 - 95.7 fL    MCH 33.8 (H) 25.9 - 32.4 pg    MCHC 34.9 32.0 - 36.0 g/dL    RDW 85.7 87.7 - 84.7 %    MPV 7.5 6.8 - 10.7 fL    Platelet 186 150 - 450 10*9/L   Magnesium  Level   Result Value Ref Range    Magnesium  2.1 1.6 - 2.6 mg/dL   Basic Metabolic Panel   Result Value Ref Range    Sodium 134 (L) 135 - 145 mmol/L    Potassium 4.6 3.4 - 4.8 mmol/L    Chloride 98 98 - 107 mmol/L    CO2 25.7 20.0 - 31.0 mmol/L    Anion Gap 10 5 - 14 mmol/L    BUN 20 9 - 23 mg/dL    Creatinine 8.87 9.26 - 1.18 mg/dL    BUN/Creatinine Ratio 18     eGFR CKD-EPI (2021) Male 63 >=60 mL/min/1.46m2    Glucose 118 70 - 179 mg/dL    Calcium  8.9 8.7 - 10.4 mg/dL   CBC   Result Value Ref Range    WBC 10.2 3.6 - 11.2 10*9/L    RBC 3.11 (L) 4.26 - 5.60 10*12/L    HGB 10.5 (L) 12.9 - 16.5 g/dL  HCT 30.3 (L) 39.0 - 48.0 %    MCV 97.2 (H) 77.6 - 95.7 fL    MCH 33.9 (H) 25.9 - 32.4 pg    MCHC 34.9 32.0 - 36.0 g/dL    RDW 85.7 87.7 - 84.7 %    MPV 7.5 6.8 - 10.7 fL    Platelet 181 150 - 450 10*9/L   Magnesium  Level   Result Value Ref Range    Magnesium  1.9 1.6 - 2.6 mg/dL   ECG 12 Lead   Result Value Ref Range    EKG Systolic BP  mmHg    EKG Diastolic BP  mmHg    EKG Ventricular Rate 114 BPM    EKG Atrial Rate 127 BPM    EKG P-R Interval 224 ms    EKG QRS Duration 94 ms    EKG Q-T Interval 376 ms    EKG QTC Calculation 518 ms    EKG Calculated P Axis  degrees    EKG Calculated R Axis 164 degrees    EKG Calculated T Axis 53 degrees    QTC Fredericia 465 ms   ECG 12 Lead   Result Value Ref Range    EKG Systolic BP  mmHg    EKG Diastolic BP  mmHg    EKG Ventricular Rate 106 BPM    EKG Atrial Rate  BPM    EKG P-R Interval  ms    EKG QRS Duration 92 ms    EKG Q-T Interval 384 ms    EKG QTC Calculation 510 ms    EKG Calculated P Axis  degrees    EKG Calculated R Axis 166 degrees    EKG Calculated T Axis -73 degrees    QTC Fredericia 464 ms   ECG 12 Lead   Result Value Ref Range    EKG Systolic BP  mmHg    EKG Diastolic BP  mmHg    EKG Ventricular Rate 114 BPM    EKG Atrial Rate  BPM    EKG P-R Interval  ms    EKG QRS Duration 94 ms    EKG Q-T Interval 376 ms    EKG QTC Calculation 518 ms    EKG Calculated P Axis  degrees    EKG Calculated R Axis 164 degrees    EKG Calculated T Axis 53 degrees    QTC Fredericia 465 ms   CBC w/ Differential   Result Value Ref Range    WBC 5.9 3.6 - 11.2 10*9/L    RBC 3.20 (L) 4.26 - 5.60 10*12/L    HGB 10.8 (L) 12.9 - 16.5 g/dL    HCT 68.4 (L) 60.9 - 48.0 %    MCV 98.4 (H) 77.6 - 95.7 fL    MCH 33.7 (H) 25.9 - 32.4 pg    MCHC 34.3 32.0 - 36.0 g/dL    RDW 85.4 87.7 - 84.7 %    MPV 7.8 6.8 - 10.7 fL    Platelet 205 150 - 450 10*9/L    nRBC 0 <=4 /100 WBCs    Neutrophils % 72.6 %    Lymphocytes % 17.7 %    Monocytes % 7.6 %    Eosinophils % 1.3 %    Basophils % 0.8 %    Absolute Neutrophils 4.3 1.8 - 7.8 10*9/L    Absolute Lymphocytes 1.0 (L) 1.1 - 3.6 10*9/L    Absolute Monocytes 0.5 0.3 - 0.8 10*9/L    Absolute Eosinophils 0.1 0.0 - 0.5 10*9/L  Absolute Basophils 0.0 0.0 - 0.1 10*9/L     Lab Results   Component Value Date    PLT 181 09/07/2024     Lab Results   Component Value Date    INR 1.07 08/13/2024     Lab Results   Component Value Date    PROBNP 3,415.0 (H) 09/04/2024       I personally spent 40 minutes face-to-face and non-face-to-face in the care of this patient, which includes all pre, intra, and post visit time on the date of service; reviewing EMR, patient physical exam, reviewing results, patient/family education, communication with primary team and all related documentation.  All documented time was specific to the E/M visit and does not include any procedures that may have been performed.    Kerry Pizza, ACNP-AG  September 07, 2024   Division of Cardiology   Cardiovascular Medicine         Disclaimer: Nuance Dragon voice recognition software was used to create portions of this document.  An attempt at proofreading has been made to minimize errors.  If, however, the reader notes inconsistent information and/or needs to ask questions concerning any part of the document, please contact me for clarification.  Occasional interpretation errors may inadvertently occur due to limitations in the software.            [1] No Known Allergies  [2]    apixaban   2.5 mg Oral BID    ascorbic acid  (vitamin C )  500 mg Oral Daily    budesonide   0.5 mg Nebulization BID    calcium  carbonate  600 mg elem calcium  Oral BID    cyanocobalamin  (vitamin B-12)  1,000 mcg Oral Daily    doxycycline   100 mg Oral BID    ipratropium  500 mcg Nebulization Q6H (RT)    levalbuterol   0.63 mg Nebulization Q6H (RT)    metoPROLOL  tartrate  50 mg Oral Q6H SCH    pantoprazole   40 mg Oral Daily before breakfast    pravastatin   20 mg Oral Nightly    predniSONE   40 mg Oral Daily    tamsulosin   0.4 mg Oral Daily

## 2024-09-08 LAB — CBC
HEMATOCRIT: 32.4 % — ABNORMAL LOW (ref 39.0–48.0)
HEMOGLOBIN: 11.3 g/dL — ABNORMAL LOW (ref 12.9–16.5)
MEAN CORPUSCULAR HEMOGLOBIN CONC: 34.7 g/dL (ref 32.0–36.0)
MEAN CORPUSCULAR HEMOGLOBIN: 33.7 pg — ABNORMAL HIGH (ref 25.9–32.4)
MEAN CORPUSCULAR VOLUME: 96.9 fL — ABNORMAL HIGH (ref 77.6–95.7)
MEAN PLATELET VOLUME: 7.3 fL (ref 6.8–10.7)
PLATELET COUNT: 183 10*9/L (ref 150–450)
RED BLOOD CELL COUNT: 3.34 10*12/L — ABNORMAL LOW (ref 4.26–5.60)
RED CELL DISTRIBUTION WIDTH: 13.9 % (ref 12.2–15.2)
WBC ADJUSTED: 7.6 10*9/L (ref 3.6–11.2)

## 2024-09-08 LAB — BASIC METABOLIC PANEL
ANION GAP: 11 mmol/L (ref 5–14)
BLOOD UREA NITROGEN: 19 mg/dL (ref 9–23)
BUN / CREAT RATIO: 18
CALCIUM: 9.5 mg/dL (ref 8.7–10.4)
CHLORIDE: 98 mmol/L (ref 98–107)
CO2: 26.4 mmol/L (ref 20.0–31.0)
CREATININE: 1.03 mg/dL (ref 0.73–1.18)
EGFR CKD-EPI (2021) MALE: 70 mL/min/1.73m2 (ref >=60)
GLUCOSE RANDOM: 120 mg/dL (ref 70–179)
POTASSIUM: 4.5 mmol/L (ref 3.4–4.8)
SODIUM: 135 mmol/L (ref 135–145)

## 2024-09-08 LAB — MAGNESIUM: MAGNESIUM: 1.9 mg/dL (ref 1.6–2.6)

## 2024-09-08 MED ORDER — DOXYCYCLINE HYCLATE 100 MG TABLET
ORAL_TABLET | Freq: Two times a day (BID) | ORAL | 0 refills | 2.00000 days
Start: 2024-09-08 — End: 2024-09-10

## 2024-09-08 MED ADMIN — oxyCODONE-acetaminophen (PERCOCET) 5-325 mg tablet 0.5 tablet: .5 | ORAL | Stop: 2024-09-18

## 2024-09-08 MED ADMIN — Propofol (DIPRIVAN) injection: INTRAVENOUS | @ 15:00:00 | Stop: 2024-09-08

## 2024-09-08 MED ADMIN — levalbuterol (XOPENEX) nebulizer solution 0.63 mg: .63 mg | RESPIRATORY_TRACT | @ 02:00:00 | Stop: 2024-09-12

## 2024-09-08 MED ADMIN — doxycycline (VIBRA-TABS) tablet 100 mg: 100 mg | ORAL | @ 21:00:00 | Stop: 2024-09-11

## 2024-09-08 MED ADMIN — ipratropium (ATROVENT) 0.02 % nebulizer solution 500 mcg: 500 ug | RESPIRATORY_TRACT | @ 02:00:00

## 2024-09-08 MED ADMIN — apixaban (ELIQUIS) tablet 2.5 mg: 2.5 mg | ORAL | @ 12:00:00

## 2024-09-08 MED ADMIN — ascorbic acid (vitamin C) (VITAMIN C) tablet 500 mg: 500 mg | ORAL | @ 12:00:00

## 2024-09-08 MED ADMIN — pravastatin (PRAVACHOL) tablet 20 mg: 20 mg | ORAL

## 2024-09-08 MED ADMIN — calcium carbonate tablet 600 mg elem calcium: 600 mg | ORAL | @ 12:00:00

## 2024-09-08 MED ADMIN — calcium carbonate tablet 600 mg elem calcium: 600 mg | ORAL

## 2024-09-08 MED ADMIN — magnesium oxide (MAG-OX) tablet 400 mg: 400 mg | ORAL | @ 12:00:00 | Stop: 2024-09-08

## 2024-09-08 MED ADMIN — oxyCODONE-acetaminophen (PERCOCET) 5-325 mg tablet 0.5 tablet: .5 | ORAL | @ 16:00:00 | Stop: 2024-09-18

## 2024-09-08 MED ADMIN — apixaban (ELIQUIS) tablet 2.5 mg: 2.5 mg | ORAL

## 2024-09-08 MED ADMIN — metoPROLOL tartrate (Lopressor) tablet 50 mg: 50 mg | ORAL | @ 05:00:00 | Stop: 2024-09-08

## 2024-09-08 MED ADMIN — levalbuterol (XOPENEX) nebulizer solution 0.63 mg: .63 mg | RESPIRATORY_TRACT | @ 09:00:00 | Stop: 2024-09-12

## 2024-09-08 MED ADMIN — levalbuterol (XOPENEX) nebulizer solution 0.63 mg: .63 mg | RESPIRATORY_TRACT | @ 13:00:00 | Stop: 2024-09-12

## 2024-09-08 MED ADMIN — tamsulosin (FLOMAX) 24 hr capsule 0.4 mg: .4 mg | ORAL | @ 12:00:00

## 2024-09-08 MED ADMIN — cyanocobalamin (vitamin B-12) tablet 1,000 mcg: 1000 ug | ORAL | @ 12:00:00

## 2024-09-08 MED ADMIN — oxyCODONE-acetaminophen (PERCOCET) 5-325 mg tablet 0.5 tablet: .5 | ORAL | @ 21:00:00 | Stop: 2024-09-18

## 2024-09-08 MED ADMIN — budesonide (PULMICORT) nebulizer solution 0.5 mg: .5 mg | RESPIRATORY_TRACT | @ 01:00:00

## 2024-09-08 MED ADMIN — pantoprazole (Protonix) EC tablet 40 mg: 40 mg | ORAL | @ 12:00:00

## 2024-09-08 MED ADMIN — budesonide (PULMICORT) nebulizer solution 0.5 mg: .5 mg | RESPIRATORY_TRACT | @ 13:00:00

## 2024-09-08 MED ADMIN — ipratropium (ATROVENT) 0.02 % nebulizer solution 500 mcg: 500 ug | RESPIRATORY_TRACT | @ 09:00:00

## 2024-09-08 MED ADMIN — oxyCODONE-acetaminophen (PERCOCET) 5-325 mg tablet 0.5 tablet: .5 | ORAL | @ 01:00:00 | Stop: 2024-09-18

## 2024-09-08 MED ADMIN — doxycycline (VIBRA-TABS) tablet 100 mg: 100 mg | ORAL | @ 10:00:00 | Stop: 2024-09-11

## 2024-09-08 MED ADMIN — ipratropium (ATROVENT) 0.02 % nebulizer solution 500 mcg: 500 ug | RESPIRATORY_TRACT | @ 20:00:00

## 2024-09-08 MED ADMIN — levalbuterol (XOPENEX) nebulizer solution 0.63 mg: .63 mg | RESPIRATORY_TRACT | @ 20:00:00 | Stop: 2024-09-12

## 2024-09-08 MED ADMIN — ipratropium (ATROVENT) 0.02 % nebulizer solution 500 mcg: 500 ug | RESPIRATORY_TRACT | @ 13:00:00

## 2024-09-08 MED ADMIN — predniSONE (DELTASONE) tablet 40 mg: 40 mg | ORAL | @ 12:00:00 | Stop: 2024-09-08

## 2024-09-08 NOTE — Unmapped (Signed)
 CARDIOLOGY PROGRESS NOTE    Name: Erik [name]  MRN: [id number]  DOB: [birth year]  Date: 09/08/2024    Patient is seen several hours status post cardioversion. He is drowsy but arousable. He denies specific cardiac complaints. He is HD stable and remains in normal sinus rhythm.     Physical examination:  Vitals:    09/08/24 1200   BP: 166/93   Pulse:    Resp: 18   Temp: 36.8 ??C (98.2 ??F)   SpO2: 93%   HR 65-75 (normal sinus rhythm)  General - No apparent distress  Chest - coarse breath sounds, no wheezes, no rales  CVD - RRR, normal S1 and S2  Abdomen - benign  Extremities - warm, palpable radial and DP/PT pulses    Diagnostic data:  ECG (post-DCCV, 09-08-2024):  NSR at 60-65 bpm, normal intervals (PR 172 ms, QRS 102 ms), no acute ST changes.     Recommendations:  1) Atrial fibrillation  - Continue DOAC (eliquis ) therapy for stroke prophylaxis  - Initiate metoprolol  50 mg twice daily  - Continue telemetry    2) CAD status post CABG  - Continue pravastatin   - No indication for aspirin  while on DOAC therapy    3) Hypertension  - Resume home medications including PRN lasix  and losartan  (as long as BP tolerates)  - Goal BP <130/80 mnHg      Maude Rocks, MD, MPH  Interventional Cardiology  Professor, Cardiovascular Medicine  [facility]  [employer]  [place], [place] [address]

## 2024-09-08 NOTE — Unmapped (Signed)
 Shift Summary  Oxygen  therapy was started in the morning, resulting in improved oxygen  saturation levels for the remainder of the shift.  Blood pressure readings increased throughout the day, with several measurements above the desired range in the afternoon.  Pain was managed with PRN oxyCODONE -acetaminophen , with pain scores fluctuating but ultimately decreasing to zero by late afternoon.  Propofol  was administered in the [facility] department during the late morning.  Overall, safety interventions were maintained and no falls or injuries were documented during the shift.    Absence of Fall and Fall-Related Injury: Fall prevention measures were consistently maintained throughout the shift, including bed alarms, frequent toileting, and hourly visual checks, with no documented falls or injuries.    Blood Pressure in Desired Range: Blood pressure fluctuated and trended upward during the shift, with several readings above the desired range, especially in the afternoon.    Improved Oral Intake: Oral intake remained limited, with only 50 mL consumed and snack intake at 0%, while the ability to feed self was preserved.    Effective Oxygenation and Ventilation: Oxygen  saturation improved after oxygen  therapy initiation and remained above 90% for most of the shift, with respiratory status documented as within defined limits later in the day.

## 2024-09-08 NOTE — Unmapped (Signed)
 SLP attempted to see patient for Clinical Swallowing Evaluation this date. Following several minutes of conversation where patient provided some history, patient adamantly declined to work with SLP stating he just wanted to eat lunch, further declined for evaluation to take place during lunch and stated, I just want rehab to leave me alone. Given adamant refusal to participate, SLP to sign off at this time. Please reconsult with change in patient wishes.

## 2024-09-08 NOTE — Unmapped (Signed)
 Family Medicine Inpatient Progress Note    Assessment & Plan:   Erik [name] is a 89 y.o. male with past medical history of CAD, s/p 3V CABG 2001, atrial fibrillation diagnosed 07/2024, hypertension, HLD, and COPD who was brought in by ambulance with worsening difficulty breathing likely secondary to COPD exacerbation.     Principal Problem:    COPD exacerbation    (CMS-HCC)  Active Problems:    Essential hypertension, benign    Chronic back pain    CAD (coronary artery disease), autologous vein bypass graft    Tobacco use disorder    Chronic pain syndrome    Giant cell arteritis    (CMS-HCC)    Constipation    Dyslipidemia    Macrocytosis without anemia    BPH (benign prostatic hyperplasia)    COPD (chronic obstructive pulmonary disease)    (CMS-HCC)    Atrial fibrillation    (CMS-HCC)    Active Problems    #Acute Hypoxic Respiratory Failure - COPD exacerbation  Hypoxic on arrival with mild increased work of breathing.  Initial presentation concerning for COPD exacerbation vs volume overload.  Patient was placed on BiPAP and had a good response.  His initial workup was notable for a proBNP elevated to greater than 3000 (recent proBNP greater than 6000 during ER visit for new-onset atrial fibrillation), and chest x-ray notable for a small right pleural effusion and bibasilar basilar atelectasis, and EKG demonstrating atrial fibrillation with rapid ventricular rate.  Given furosemide  20 mg IV in the ER and his lung exam not notable for findings of fluid overload as well as diffuse wheezing suggestive of COPD exacerbation. Lung exam today with clear breath sounds bilaterally.   Now on room air.  Continues to have significant sputum production and purulence, improved with breathing tx.  - Levalbuterol  q6h  - Ipratropium q6h  - Budesonide  nebs 0.5 bid  - Prednisone  40 mg daily   - Doxycycline  for 5 days (9/15- ) for purulent sputum    #Atrial fibrillation   Patient taking both atenolol  50 mg daily and metoprolol  tartrate 25 mg tid for the past 2 weeks. He is appropriately anticoagulated with apixaban . He was scheduled for an outpatient cardioversion later this week. No clear evidence of volume overload; weight stable, no rales on exam. Prelim echo read: nl LV, EF >55%, mild pulm HTN, nl RV, mildly elev RA pressure.  - Metoprolol  tartrate 50 mg q6h, holding AM dose for cardioversion  - Follow up cards recs after cardioversion today  - Apixaban  2.5 mg twice daily    #R groin mass  Mass with venous flow noted on a recent outpatient LE doppler, continues to be asymptomatic. Defer additional imaging for present.    #Constipation: Miralax , Senna prn     # L eye scleral hematoma  Likely secondary to trauma from hitting left eye with nasal cannula.  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  No pain with extraocular movements.  No drainage.  Hematoma only involving sclera.  - Continue to monitor    Chronic Problems    # HTN  Patient with multiple BP agents outpatient held since admission for soft blood pressures.  - Home regimen: amlodipine  10 mg daily, atenolol  50 mg daily, losartan  50 mg daily, hydralazine  25 mg daily, furosemide  10-20 mg daily prn  - Inpatient regimen: metoprolol  50 mg qid  - Will plan to resume furosemide  20 mg daily if hypoxia worsens  - Continuing to amlodipine  5 mg daily, discontinuing hydralazine      #  HLD  - Pravastatin  for home simvastatin  10 mg daily while admitted     # RLE arterial stenosis  Recent venous doppler suggested an arterial stenosis, currently asymptomatic.    # Back pain  - Home percocet  2.5-125 mg q6h prn     # BPH  - Home tamsulosin  0.4mg  daily    Daily Checklist:  Diet: Regular Diet, NPO at midnight  DVT PPx: Patient Already on Full Anticoagulation with eliquis   Electrolytes: Replete Potassium to >/=4 and Magnesium  to >/=2  Code Status: Full Code  Dispo: floor     Team Contact Information:   Primary Team: Family Medicine [place]  Primary Resident: Alyce Pipes  Resident's Pager: [facility] [phone])    Interval History:   No acute events overnight.  Slept poorly due to coughing. Breathing better today. Significant sputum production this morning.    All other systems were reviewed and are negative except as noted in the HPI    Objective:   Temp:  [36.9 ??C (98.4 ??F)-37.2 ??C (99 ??F)] 37 ??C (98.6 ??F)  Pulse:  [86-112] 108  SpO2 Pulse:  [83-138] 83  Resp:  [14-25] 18  BP: (116-148)/(59-80) 144/72  SpO2:  [85 %-99 %] 99 %    Gen: NAD, converses   HENT: normocephalic, left eye scleral hematoma  Heart: Irregularly irregular rhythm, tachycardic  Lungs: CTAB, no crackles or wheezes  Abdomen: soft, NTND  Extremities: No lower extremity edema    [name], MS3    I attest that I have reviewed the student note, and that the components of the history of the present illness, the physical exam, and the assessment and plan documented were performed by me or were performed in my presence by the student where I verified the documentation and performed (or re-performed) the exam and medical decision making.     Alyce Pipes, MD PGY2

## 2024-09-08 NOTE — Unmapped (Signed)
 CARDIAC PROCEDURE NOTE: SYNCHRONIZED CARDIOVERSION WITH CONSCIOUS SEDATION (ANESTHESIA ADMINISTERED)    Name: Erik [name]  MRN: [id number]  Date: 09/08/2024    Indication: Atrial fibrillation with rapid ventricular response  HPI: [name] is a 89 y.o. male with history of recent onset atrial fibrillation started on oral anticoagulation with planned future cardioversion. Then admitted with hypoxic respiratory failure attributed to COPD exacerbation and mild congestive heart failure. Now presents for elective cardioversion.     Procedure: Procedural risks, benefits and alternatives were explained and discussed. All questions addressed and answered. Signed informed consent obtained.     Vital signs (baseline): 130-140 bpm (atrial fibrillation), blood pressure 130/76 mmHg, saturation 100% on 3L [place] O2, respiratory rate 14.    Provided propofol  anesthesia (see anesthesia records for details) to achieve adequate sedation.     Single synchronized biphasic external shock provided (120 J) with restoration of normal sinus rhythm.  Post cardioversion heart rate 60-70 bpm normal sinus rhythm. No heart block of pauses. No change in blood pressure.     Patient transferred to the PACU in stable condition.     Post-procedure  Vitals signs: HR 60-70 bpm, blood pressure 142/67 mmHg, saturation 100% on 3 L [place] O2, respiratory rate 13.   Patient drowsy but responsive. Denies symptomatic complaints.     EKG (post-cardioversion): NSR at 60-65 bpm, normal intervals (PR 172 ms, QRS 102 ms), no acute ST changes.     Plan:  - PACU recovery and then return to floor per protocol  - Continue oral anticoagulation  - Resume beta-blocker today; but switch to 50 mg bid      Maude Rocks, MD, MPH  Interventional Cardiology  Professor, Cardiovascular Medicine  [facility]  [employer]  [place], [place] [address]

## 2024-09-08 NOTE — Unmapped (Signed)
Pt remains on 02 via [place]. No distress noted. Pt tolerated neb txs.

## 2024-09-08 NOTE — Unmapped (Signed)
 Problem: Adult Inpatient Plan of Care  Goal: Plan of Care Review  Outcome: Progressing   Pt stable through night on RA and received all tx's as scheduled.

## 2024-09-09 LAB — CBC
HEMATOCRIT: 32.5 % — ABNORMAL LOW (ref 39.0–48.0)
HEMOGLOBIN: 11.4 g/dL — ABNORMAL LOW (ref 12.9–16.5)
MEAN CORPUSCULAR HEMOGLOBIN CONC: 35 g/dL (ref 32.0–36.0)
MEAN CORPUSCULAR HEMOGLOBIN: 33.9 pg — ABNORMAL HIGH (ref 25.9–32.4)
MEAN CORPUSCULAR VOLUME: 96.9 fL — ABNORMAL HIGH (ref 77.6–95.7)
MEAN PLATELET VOLUME: 7.3 fL (ref 6.8–10.7)
PLATELET COUNT: 196 10*9/L (ref 150–450)
RED BLOOD CELL COUNT: 3.35 10*12/L — ABNORMAL LOW (ref 4.26–5.60)
RED CELL DISTRIBUTION WIDTH: 13.6 % (ref 12.2–15.2)
WBC ADJUSTED: 8.1 10*9/L (ref 3.6–11.2)

## 2024-09-09 LAB — BASIC METABOLIC PANEL
ANION GAP: 11 mmol/L (ref 5–14)
BLOOD UREA NITROGEN: 16 mg/dL (ref 9–23)
BUN / CREAT RATIO: 16
CALCIUM: 9.5 mg/dL (ref 8.7–10.4)
CHLORIDE: 98 mmol/L (ref 98–107)
CO2: 26.8 mmol/L (ref 20.0–31.0)
CREATININE: 1.03 mg/dL (ref 0.73–1.18)
EGFR CKD-EPI (2021) MALE: 70 mL/min/1.73m2 (ref >=60)
GLUCOSE RANDOM: 109 mg/dL (ref 70–179)
POTASSIUM: 4.1 mmol/L (ref 3.4–4.8)
SODIUM: 136 mmol/L (ref 135–145)

## 2024-09-09 LAB — MAGNESIUM: MAGNESIUM: 1.9 mg/dL (ref 1.6–2.6)

## 2024-09-09 MED ADMIN — levalbuterol (XOPENEX) nebulizer solution 0.63 mg: .63 mg | RESPIRATORY_TRACT | @ 20:00:00 | Stop: 2024-09-12

## 2024-09-09 MED ADMIN — apixaban (ELIQUIS) tablet 2.5 mg: 2.5 mg | ORAL | @ 02:00:00

## 2024-09-09 MED ADMIN — levalbuterol (XOPENEX) nebulizer solution 0.63 mg: .63 mg | RESPIRATORY_TRACT | @ 01:00:00 | Stop: 2024-09-12

## 2024-09-09 MED ADMIN — budesonide (PULMICORT) nebulizer solution 0.5 mg: .5 mg | RESPIRATORY_TRACT | @ 15:00:00

## 2024-09-09 MED ADMIN — oxyCODONE-acetaminophen (PERCOCET) 5-325 mg tablet 0.5 tablet: .5 | ORAL | @ 02:00:00 | Stop: 2024-09-18

## 2024-09-09 MED ADMIN — pravastatin (PRAVACHOL) tablet 20 mg: 20 mg | ORAL | @ 02:00:00

## 2024-09-09 MED ADMIN — doxycycline (VIBRA-TABS) tablet 100 mg: 100 mg | ORAL | @ 22:00:00 | Stop: 2024-09-11

## 2024-09-09 MED ADMIN — levalbuterol (XOPENEX) nebulizer solution 0.63 mg: .63 mg | RESPIRATORY_TRACT | @ 15:00:00 | Stop: 2024-09-12

## 2024-09-09 MED ADMIN — ipratropium (ATROVENT) 0.02 % nebulizer solution 500 mcg: 500 ug | RESPIRATORY_TRACT | @ 20:00:00

## 2024-09-09 MED ADMIN — doxycycline (VIBRA-TABS) tablet 100 mg: 100 mg | ORAL | @ 12:00:00 | Stop: 2024-09-11

## 2024-09-09 MED ADMIN — losartan (COZAAR) tablet 50 mg: 50 mg | ORAL | @ 15:00:00

## 2024-09-09 MED ADMIN — apixaban (ELIQUIS) tablet 2.5 mg: 2.5 mg | ORAL | @ 12:00:00

## 2024-09-09 MED ADMIN — levalbuterol (XOPENEX) nebulizer solution 0.63 mg: .63 mg | RESPIRATORY_TRACT | @ 09:00:00 | Stop: 2024-09-12

## 2024-09-09 MED ADMIN — metoPROLOL tartrate (Lopressor) tablet 50 mg: 50 mg | ORAL | @ 12:00:00

## 2024-09-09 MED ADMIN — ipratropium (ATROVENT) 0.02 % nebulizer solution 500 mcg: 500 ug | RESPIRATORY_TRACT | @ 15:00:00

## 2024-09-09 MED ADMIN — cyanocobalamin (vitamin B-12) tablet 1,000 mcg: 1000 ug | ORAL | @ 12:00:00

## 2024-09-09 MED ADMIN — tamsulosin (FLOMAX) 24 hr capsule 0.4 mg: .4 mg | ORAL | @ 12:00:00

## 2024-09-09 MED ADMIN — budesonide (PULMICORT) nebulizer solution 0.5 mg: .5 mg | RESPIRATORY_TRACT | @ 01:00:00

## 2024-09-09 MED ADMIN — calcium carbonate tablet 600 mg elem calcium: 600 mg | ORAL | @ 12:00:00

## 2024-09-09 MED ADMIN — ipratropium (ATROVENT) 0.02 % nebulizer solution 500 mcg: 500 ug | RESPIRATORY_TRACT | @ 01:00:00

## 2024-09-09 MED ADMIN — ipratropium (ATROVENT) 0.02 % nebulizer solution 500 mcg: 500 ug | RESPIRATORY_TRACT | @ 09:00:00

## 2024-09-09 MED ADMIN — pantoprazole (Protonix) EC tablet 40 mg: 40 mg | ORAL | @ 12:00:00

## 2024-09-09 MED ADMIN — calcium carbonate tablet 600 mg elem calcium: 600 mg | ORAL | @ 02:00:00

## 2024-09-09 MED ADMIN — ascorbic acid (vitamin C) (VITAMIN C) tablet 500 mg: 500 mg | ORAL | @ 12:00:00

## 2024-09-09 MED ADMIN — metoPROLOL tartrate (Lopressor) tablet 50 mg: 50 mg | ORAL | @ 02:00:00

## 2024-09-09 MED ADMIN — oxyCODONE-acetaminophen (PERCOCET) 5-325 mg tablet 0.5 tablet: .5 | ORAL | @ 18:00:00 | Stop: 2024-09-18

## 2024-09-09 NOTE — Unmapped (Signed)
 Shift Summary  Oxygen  therapy was provided after a drop in SpO2, resulting in improved oxygen  saturation levels.   Airway clearance therapy was performed and tolerated well in Semi Fowler's position.   Pain was managed with PRN oxyCODONE -acetaminophen  after a slight increase in pain score.   Fall prevention and safety interventions were maintained without incident throughout the shift.   Overall, interventions for COPD symptom control and fall prevention were consistently implemented during the shift.    Absence of Fall and Fall-Related Injury: Fall prevention interventions, including frequent toileting, bed in low position, locked bed wheels, and call light within reach, were consistently maintained throughout the shift with no reported falls or injuries.    Maintenance of COPD Symptom Control: Oxygen  saturation improved after initial drop and remained above 90% for most of the shift, non-productive cough persisted, and respiratory status remained with exceptions to WDL; pain was stable to slightly increased and managed with PRN oxyCODONE -acetaminophen .

## 2024-09-09 NOTE — Unmapped (Signed)
 Family Medicine Inpatient Progress Note    Assessment & Plan:   Erik [name] is a 89 y.o. male with past medical history of CAD, s/p 3V CABG 2001, atrial fibrillation diagnosed 07/2024, hypertension, HLD, and COPD who was brought in by ambulance with worsening difficulty breathing likely secondary to COPD exacerbation.     Principal Problem:    COPD exacerbation    (CMS-HCC)  Active Problems:    Essential hypertension, benign    Chronic back pain    CAD (coronary artery disease), autologous vein bypass graft    Tobacco use disorder    Chronic pain syndrome    Giant cell arteritis    (CMS-HCC)    Constipation    Dyslipidemia    Macrocytosis without anemia    BPH (benign prostatic hyperplasia)    COPD (chronic obstructive pulmonary disease)    (CMS-HCC)    Atrial fibrillation    (CMS-HCC)    Active Problems    #Acute Hypoxic Respiratory Failure - COPD exacerbation  Hypoxic on arrival with mild increased WOB. Initial presentation concerning for COPD exacerbation vs volume overload, now stable on room air at rest with clear breath sounds bilaterally. Desats to 80s with activity, improved with O2.  - Levalbuterol  q6h  - Ipratropium q6h  - Budesonide  nebs 0.5 bid  - Prednisone  40 mg daily (completed, 9/14-9/17)  - Doxycycline  for 5 days (9/15-9/19) for purulent sputum  - 6 min walk test today    #Atrial fibrillation   Patient taking both atenolol  50 mg daily and metoprolol  tartrate 25 mg tid for the past 2 weeks. Appropriately anticoagulated with apixaban . Echo read: nl LV, EF >55%, mild pulm HTN, mildly dilated RV, mildly elev RA pressure. Cardioversion completed 9/17.  - Metoprolol  tartrate 50 mg bid  - Apixaban  2.5 mg twice daily    #R groin mass  Mass with venous flow noted on a recent outpatient LE doppler, continues to be asymptomatic. Defer additional imaging for present.    #Constipation: Miralax , Senna prn     # L eye subconjunctival hematoma  Likely secondary to trauma from hitting left eye with nasal cannula. 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 No pain with extraocular movements. No drainage. Improving.  - Continue to monitor    Chronic Problems    # HTN  Patient with multiple BP agents outpatient held since admission for soft blood pressures. Home regimen: amlodipine  10 mg daily, atenolol  50 mg daily, losartan  50 mg daily, hydralazine  25 mg daily, furosemide  10-20 mg daily prn.  - Metoprolol  50 mg bid  - Restart losartan  50 mg daily  - Furosemide  20 mg daily prn  - Holding amlodipine , discontinuing hydralazine      # HLD  - Pravastatin  for home simvastatin  10 mg daily while admitted     # RLE arterial stenosis  Recent venous doppler suggested an arterial stenosis, currently asymptomatic.    # Back pain  - Home percocet  2.5-125 mg q6h prn     # BPH  - Home tamsulosin  0.4mg  daily    Daily Checklist:  Diet: Regular Diet  DVT PPx: Patient Already on Full Anticoagulation with eliquis   Electrolytes: Replete Potassium to >/=4 and Magnesium  to >/=2  Code Status: Full Code  Dispo: floor     Team Contact Information:   Primary Team: [facility]  Primary Resident: Alyce Pipes  Resident's Pager: [facility] [phone])    Interval History:   No acute events overnight.  Slept well. Urinating appropriately. No  BM in a few days. Breathing well on RA while in bed, needing oxygen  with activity.    Patient feels confident he can live independently in his home, with support from family. Declining SNF or in home rehab services.    All other systems were reviewed and are negative except as noted in the HPI    Objective:   Temp:  [36.5 ??C (97.7 ??F)-37.5 ??C (99.5 ??F)] 36.6 ??C (97.8 ??F)  Pulse:  [64-82] 66  SpO2 Pulse:  [64-89] 70  Resp:  [14-22] 18  BP: (153-177)/(62-93) 173/82  SpO2:  [87 %-99 %] 99 %    Gen: NAD, converses   HENT: normocephalic, left eye subconjunctival hematoma improving  Heart: RRR,   Lungs: CTAB, no crackles or wheezes, normal WOB  Abdomen: soft, NTND  Extremities: no lower extremity edema    Erik [name], MS3    I attest that I have reviewed the student note, and that the components of the history of the present illness, the physical exam, and the assessment and plan documented were performed by me or were performed in my presence by the student where I verified the documentation and performed (or re-performed) the exam and medical decision making.     Alyce Pipes, MD PGY2

## 2024-09-09 NOTE — Unmapped (Signed)
 Shift Summary  Pain increased in the back and right knee, leading to administration of oxyCODONE -acetaminophen  and subsequent reduction in pain score to zero.   Oxygen  saturation improved on room air and respiratory treatments were well tolerated with no adverse reactions.   metoPROLOL  tartrate was administered for cardiac rhythm management, with pulse remaining stable.   No interventions were required for IV site, which remained clean, dry, and intact.   Overall, comfort and respiratory status improved during the shift.     Optimal Comfort and Wellbeing: Pain in the back and right knee increased from moderate to severe during the shift, with aching discomfort reported; oxyCODONE -acetaminophen  was administered PRN and pain score decreased to zero after intervention.     Optimal Chronic Illness Coping: Respiratory status remained within defined limits and SpO2 improved from 92% to 97% on room air, with all respiratory treatments tolerated well and no adverse reactions noted.     Normalized Cardiac Rhythm: Pulse remained stable throughout the shift, with metoPROLOL  tartrate administered and no significant changes in heart rate observed.

## 2024-09-10 DIAGNOSIS — G894 Chronic pain syndrome: Principal | ICD-10-CM

## 2024-09-10 DIAGNOSIS — M51369 DDD (degenerative disc disease), lumbar: Principal | ICD-10-CM

## 2024-09-10 LAB — MAGNESIUM: MAGNESIUM: 1.7 mg/dL (ref 1.6–2.6)

## 2024-09-10 MED ORDER — DOXYCYCLINE HYCLATE 100 MG TABLET
ORAL_TABLET | Freq: Two times a day (BID) | ORAL | 0 refills | 1.00000 days | Status: CP
Start: 2024-09-10 — End: 2024-09-11
  Filled 2024-09-10: qty 1, 1d supply, fill #0

## 2024-09-10 MED ORDER — METOPROLOL TARTRATE 50 MG TABLET
ORAL_TABLET | Freq: Two times a day (BID) | ORAL | 0 refills | 90.00000 days | Status: CP
Start: 2024-09-10 — End: 2024-12-09
  Filled 2024-09-10: qty 180, 90d supply, fill #0

## 2024-09-10 MED ORDER — OXYCODONE-ACETAMINOPHEN 5 MG-325 MG TABLET
ORAL_TABLET | Freq: Four times a day (QID) | ORAL | 0 refills | 30.00000 days | Status: CP | PRN
Start: 2024-09-10 — End: 2024-10-10

## 2024-09-10 MED ADMIN — pravastatin (PRAVACHOL) tablet 20 mg: 20 mg | ORAL | @ 01:00:00

## 2024-09-10 MED ADMIN — levalbuterol (XOPENEX) nebulizer solution 0.63 mg: .63 mg | RESPIRATORY_TRACT | @ 14:00:00 | Stop: 2024-09-10

## 2024-09-10 MED ADMIN — levalbuterol (XOPENEX) nebulizer solution 0.63 mg: .63 mg | RESPIRATORY_TRACT | @ 09:00:00 | Stop: 2024-09-10

## 2024-09-10 MED ADMIN — ipratropium (ATROVENT) 0.02 % nebulizer solution 500 mcg: 500 ug | RESPIRATORY_TRACT | @ 01:00:00

## 2024-09-10 MED ADMIN — apixaban (ELIQUIS) tablet 2.5 mg: 2.5 mg | ORAL | @ 13:00:00 | Stop: 2024-09-10

## 2024-09-10 MED ADMIN — oxyCODONE-acetaminophen (PERCOCET) 5-325 mg tablet 0.5 tablet: .5 | ORAL | @ 01:00:00 | Stop: 2024-09-18

## 2024-09-10 MED ADMIN — losartan (COZAAR) tablet 50 mg: 50 mg | ORAL | @ 13:00:00 | Stop: 2024-09-10

## 2024-09-10 MED ADMIN — ipratropium (ATROVENT) 0.02 % nebulizer solution 500 mcg: 500 ug | RESPIRATORY_TRACT | @ 14:00:00 | Stop: 2024-09-10

## 2024-09-10 MED ADMIN — metoPROLOL tartrate (Lopressor) tablet 50 mg: 50 mg | ORAL | @ 13:00:00 | Stop: 2024-09-10

## 2024-09-10 MED ADMIN — calcium carbonate tablet 600 mg elem calcium: 600 mg | ORAL | @ 01:00:00

## 2024-09-10 MED ADMIN — doxycycline (VIBRA-TABS) tablet 100 mg: 100 mg | ORAL | @ 10:00:00 | Stop: 2024-09-10

## 2024-09-10 MED ADMIN — apixaban (ELIQUIS) tablet 2.5 mg: 2.5 mg | ORAL | @ 01:00:00

## 2024-09-10 MED ADMIN — oxyCODONE-acetaminophen (PERCOCET) 5-325 mg tablet 0.5 tablet: .5 | ORAL | @ 13:00:00 | Stop: 2024-09-10

## 2024-09-10 MED ADMIN — metoPROLOL tartrate (Lopressor) tablet 50 mg: 50 mg | ORAL | @ 01:00:00

## 2024-09-10 MED ADMIN — calcium carbonate tablet 600 mg elem calcium: 600 mg | ORAL | @ 13:00:00 | Stop: 2024-09-10

## 2024-09-10 MED ADMIN — pantoprazole (Protonix) EC tablet 40 mg: 40 mg | ORAL | @ 13:00:00 | Stop: 2024-09-10

## 2024-09-10 MED ADMIN — tamsulosin (FLOMAX) 24 hr capsule 0.4 mg: .4 mg | ORAL | @ 13:00:00 | Stop: 2024-09-10

## 2024-09-10 MED ADMIN — budesonide (PULMICORT) nebulizer solution 0.5 mg: .5 mg | RESPIRATORY_TRACT | @ 14:00:00 | Stop: 2024-09-10

## 2024-09-10 MED ADMIN — levalbuterol (XOPENEX) nebulizer solution 0.63 mg: .63 mg | RESPIRATORY_TRACT | Stop: 2024-09-12

## 2024-09-10 MED ADMIN — cyanocobalamin (vitamin B-12) tablet 1,000 mcg: 1000 ug | ORAL | @ 13:00:00 | Stop: 2024-09-10

## 2024-09-10 MED ADMIN — ascorbic acid (vitamin C) (VITAMIN C) tablet 500 mg: 500 mg | ORAL | @ 13:00:00 | Stop: 2024-09-10

## 2024-09-10 MED ADMIN — budesonide (PULMICORT) nebulizer solution 0.5 mg: .5 mg | RESPIRATORY_TRACT

## 2024-09-10 MED ADMIN — ipratropium (ATROVENT) 0.02 % nebulizer solution 500 mcg: 500 ug | RESPIRATORY_TRACT | @ 09:00:00 | Stop: 2024-09-10

## 2024-09-10 NOTE — Unmapped (Signed)
 Family Medicine Inpatient Progress Note    Assessment & Plan:   Erik [name] is a 89 y.o. male with past medical history of CAD, s/p 3V CABG 2001, atrial fibrillation diagnosed 07/2024, hypertension, HLD, and COPD who was brought in by ambulance with worsening difficulty breathing likely secondary to COPD exacerbation.     Principal Problem:    COPD exacerbation    (CMS-HCC)  Active Problems:    Essential hypertension, benign    Chronic back pain    CAD (coronary artery disease), autologous vein bypass graft    Tobacco use disorder    Chronic pain syndrome    Giant cell arteritis    (CMS-HCC)    Constipation    Dyslipidemia    Macrocytosis without anemia    BPH (benign prostatic hyperplasia)    COPD (chronic obstructive pulmonary disease)    (CMS-HCC)    Atrial fibrillation    (CMS-HCC)    Active Problems    #Acute Hypoxic Respiratory Failure - COPD exacerbation  Hypoxic on arrival with mild increased WOB. Initial presentation concerning for COPD exacerbation vs volume overload, now stable on room air at rest with clear breath sounds bilaterally. Desats to 80s with activity, improved with O2.  - Levalbuterol  q6h  - Ipratropium q6h  - Budesonide  nebs 0.5 bid  - Prednisone  40 mg daily (completed, 9/14-9/17)  - Doxycycline  for 5 days (9/15-9/19) for purulent sputum  - 6 min walk test today    DME:   RW-The beneficiary has a mobility limitation that significantly impairs his/her ability to participate in one or more mobility related activities of daily living in the home. A mobility limitation provides the beneficiary from accomplishing MRADL entirely. The beneficiary is able to safely use the walker. The functional mobility deficit can be sufficiently resolved with use of a walker   BSC-The beneficiary is confined to a single room     #Atrial fibrillation   Patient taking both atenolol  50 mg daily and metoprolol  tartrate 25 mg tid for the past 2 weeks. Appropriately anticoagulated with apixaban . Echo read: nl LV, EF >55%, mild pulm HTN, mildly dilated RV, mildly elev RA pressure. Cardioversion completed 9/17.  - Metoprolol  tartrate 50 mg bid  - Apixaban  2.5 mg twice daily    #R groin mass  Mass with venous flow noted on a recent outpatient LE doppler, continues to be asymptomatic. Defer additional imaging for present.    #Constipation: Miralax , Senna prn     # L eye subconjunctival hematoma  Likely secondary to trauma from hitting left eye with nasal cannula. 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 No pain with extraocular movements. No drainage. Improving.  - Continue to monitor    Chronic Problems    # HTN  Patient with multiple BP agents outpatient held since admission for soft blood pressures. Home regimen: amlodipine  10 mg daily, atenolol  50 mg daily, losartan  50 mg daily, hydralazine  25 mg daily, furosemide  10-20 mg daily prn.  - Metoprolol  50 mg bid  - Restart losartan  50 mg daily  - Furosemide  20 mg daily prn  - Holding amlodipine , discontinuing hydralazine      # HLD  - Pravastatin  for home simvastatin  10 mg daily while admitted     # RLE arterial stenosis  Recent venous doppler suggested an arterial stenosis, currently asymptomatic.    # Back pain  - Home percocet  2.5-125 mg q6h prn     # BPH  - Home tamsulosin  0.4mg  daily    Daily Checklist:  Diet: Regular Diet  DVT PPx: Patient Already on Full Anticoagulation with eliquis   Electrolytes: Replete Potassium to >/=4 and Magnesium  to >/=2  Code Status: Full Code  Dispo: floor     Team Contact Information:   Primary Team: [facility]  Primary Resident: Alyce Pipes  Resident's Pager: [facility] [phone])    Interval History:   No acute events overnight.  Slept well. Urinating appropriately. No BM in a few days. Breathing well on RA while in bed, needing oxygen  with activity.    Patient feels confident he can live independently in his home, with support from family. Declining SNF or in home rehab services.    All other systems were reviewed and are negative except as noted in the HPI    Objective:   Temp:  [36.5 ??C (97.7 ??F)-36.7 ??C (98 ??F)] 36.5 ??C (97.7 ??F)  Pulse:  [58-82] 81  SpO2 Pulse:  [56-83] 83  Resp:  [16-18] 17  BP: (150-204)/(59-84) 166/65  SpO2:  [92 %-100 %] 93 %    Gen: NAD, converses   HENT: normocephalic, left eye subconjunctival hematoma improving  Heart: RRR,   Lungs: CTAB, no crackles or wheezes, normal WOB  Abdomen: soft, NTND  Extremities: no lower extremity edema    [name], MS3    I attest that I have reviewed the student note, and that the components of the history of the present illness, the physical exam, and the assessment and plan documented were performed by me or were performed in my presence by the student where I verified the documentation and performed (or re-performed) the exam and medical decision making.     Alyce Pipes, MD PGY2

## 2024-09-10 NOTE — Unmapped (Addendum)
 Home health has been set up for through the agency listed below. The Home health agency will be contacting you to set up a time for them to come see you in your home within 2 days of your discharge.  If you have not heard from them prior to 09/12/24 or you have any questions about home health, please contact them at the phone number listed below.    Durable Medical Equipment - Discharged on 09/10/2024 Admission date: 09/04/2024 - Discharge disposition: Home with Self Care      Service Provider Services Address Phone Fax Patient Preferred    [facility] - DME  Durable Medical Equipment [address] [place], [place] [address] [phone] --          [facility]  - Discharged on 09/10/2024 Admission date: 09/04/2024 - Discharge disposition: Home with Self Care      Service Provider Services Address Phone Fax Patient Preferred    [facility] - [facility] [address] Erik [name] [place] [address] [phone] --

## 2024-09-10 NOTE — Unmapped (Signed)
 Patient is requesting the following refill  Requested Prescriptions     Pending Prescriptions Disp Refills    oxyCODONE -acetaminophen  (PERCOCET ) 5-325 mg per tablet 120 tablet 0     Sig: Take 1 tablet by mouth every six (6) hours as needed for pain.       Recent Visits  Date Type Provider Dept   08/26/24 Office Visit Mangel, Benison Pap, DO [facility] [address] [facility]   02/03/24 Office Visit Mangel, Benison Pap, DO [facility] [address] [facility]   Showing recent visits within past 365 days and meeting all other requirements  Future Appointments  Date Type Provider Dept   10/14/24 Appointment Mangel, Benison Pap, DO [facility] [address] [facility]   Showing future appointments within next 365 days and meeting all other requirements       Labs: Not applicable this refill

## 2024-09-10 NOTE — Unmapped (Signed)
 Copied from CRM #[id number]. Topic: Access To Clinicians - Medication Refill  >> Sep 10, 2024 10:39 AM Suzen RAMAN wrote:  The patient is requesting the following: His refill is due on 9/21.      Medication(s): oxyCODONE -acetaminophen  (PERCOCET ) 5-325 mg tablet 0.5 tablet     Quantity for 120/month     Pharmacy name and address: [facility] [address] [place], [place] - [address]  [address] [place] [address]  Phone: [phone] Fax: [phone]  Hours: Not open 24 hours      Coverage: yes, coverage is accurate on file.    Medication request turnaround time: 72 business hours. (Caller Notified)          Does the caller want to be contacted regarding this request? Yes. Please contact The patient by Cell Phone Telephone Information:  Mobile          [phone]

## 2024-09-10 NOTE — Unmapped (Signed)
 Physician Discharge Summary HBR  2 BT1 HBR  [address]  [place] [address]  Dept: [phone]  Loc: [phone]     Identifying Information:   Erik [name]  03/04/1934  [id number]    Primary Care Physician: Keven Crumbly Pap, DO   Code Status: Full Code    Admit Date: 09/04/2024    Discharge Date: 09/10/2024     Discharge To: Home    Discharge Service: [facility] - [name]     Discharge Attending Physician: No att. providers found    Discharge Diagnoses:  Principal Problem:    COPD exacerbation    (CMS-HCC)  Active Problems:    Essential hypertension, benign    Chronic back pain    CAD (coronary artery disease), autologous vein bypass graft    Tobacco use disorder    Chronic pain syndrome    Giant cell arteritis    (CMS-HCC)    Constipation    Dyslipidemia    Macrocytosis without anemia    BPH (benign prostatic hyperplasia)    COPD (chronic obstructive pulmonary disease)    (CMS-HCC)    Atrial fibrillation    (CMS-HCC)    Outpatient Provider Follow Up Issues:   [ ]  Consider resuming hydralazine  if BP elevated  [ ]  Consider stopping amlodipine  given lower leg edema    Hospital Course:   Erik [name] is a 89 y.o. male  with Afib on DOAC, CAD s/p CABG, HTN, COPD who was admitted to [facility] for acute hypoxic respiratory failure due to COPD exacerbation. Hospital course is outlined below by problem.     # Acute Hypoxic Respiratory Failure - Elevated Pro-BNP - COPD exacerbation  Hypoxic on arrival with mild increased work of breathing.  Initial presentation concerning for COPD exacerbation vs volume overload.  Patient was placed on BiPAP and had a good response.  His initial workup was notable for a proBNP elevated to greater than 3000 (recent proBNP greater than 6000 during ER visit for new-onset atrial fibrillation), and chest x-ray notable for a small right pleural effusion and bibasilar basilar atelectasis, and EKG demonstrating atrial fibrillation with rapid ventricular rate.  Given furosemide  20 mg IV in the ER and his initial lung exam not notable for findings of fluid overload as well as diffuse wheezing suggestive of COPD exacerbation.  He was treated with breathing treatments, prednisone , and doxycyline for his COPD exacerbation with good response.  Bedside POCUS revealed evidence of bilateral pleural effusion with right greater than left, ascites, free fluid around the bladder, and dilated IVC. He has been weaned off oxygen . Discharged on 9/19 to complete 5-day course of doxycycline  and prednisone  taper for COPD exacerbation with purulent sputum production. Continue home inhalers.     #Atrial fibrillation  Patient taking atenolol  50 mg daily and metoprolol  tartrate 25 mg tid for the past 2 weeks prior to admission. He is appropriately anticoagulated with apixaban . Echo this admission showed LV EF >55%, mild-mod pulm HTN, normal RV size and function, and mildly elev RA pressure. He was treated with metoprolol  tartrate 50 mg q6h. Cardioversion completed on 9/17 with no complications and restoration of regular rate and rhythm. He was transitioned to metoprolol  50 mg BID following the procedure.    Chronic Problems   # HTN: Home regimen was held on admission (amlodipine  10 mg daily, atenolol  50 mg daily, losartan  50 mg daily, hydralazine  25 mg daily, furosemide  10-20 mg daily prn) given soft pressures initially. Patient received metoprolol  50 mg  qid then bid while admitted, and losartan  50 mg was restarted on 9/18.     # HLD: Patient received pravastatin  for home simvastatin  10 mg daily.     # Back pain: Patient received home percocet  q6h as needed for chronic pain.     # BPH: Patient received home tamsulosin  daily.    Touchbase with Outpatient Provider:  Warm Handoff: Completed on 09/10/24 by Alyce SHAUNNA Pipes, MD  (Resident) via [facility] Message    Procedures:  - Cardioversion  ______________________________________________________________________  Discharge Medications:     Your Medication List        PAUSE taking these medications      hydrALAZINE  25 MG tablet  Wait to take this until your doctor or other care provider tells you to start again.  Follow up with PCP to see if needed to be continued or not  Commonly known as: APRESOLINE   Take 1 tablet by mouth once daily            STOP taking these medications      amoxicillin -clavulanate 875-125 mg per tablet  Commonly known as: AUGMENTIN      atenolol  50 MG tablet  Commonly known as: TENORMIN             START taking these medications      doxycycline  100 MG tablet  Commonly known as: VIBRA -TABS  Take 1 tablet (100 mg total) by mouth two (2) times a day for 1 dose.     predniSONE  10 MG tablet  Commonly known as: DELTASONE   Take 3 tablets (30 mg total) by mouth daily for 3 days, THEN 2 tablets (20 mg total) daily for 3 days, THEN 1 tablet (10 mg total) daily for 3 days.  Start taking on: September 11, 2024            CHANGE how you take these medications      metoPROLOL  tartrate 50 MG tablet  Commonly known as: Lopressor   Take 1 tablet (50 mg total) by mouth two (2) times a day.  What changed:   medication strength  how much to take  when to take this  reasons to take this  additional instructions            CONTINUE taking these medications      amlodipine  10 MG tablet  Commonly known as: NORVASC   Take 1 tablet (10 mg total) by mouth daily.     ascorbic acid  (vitamin C ) 500 MG tablet  Commonly known as: VITAMIN C   Take 1 tablet (500 mg total) by mouth daily.     budesonide  0.5 mg/2 mL nebulizer solution  Commonly known as: PULMICORT   Inhale 2 mL (0.5 mg total) by nebulization two (2) times a day.     calcium  carbonate 1,500 mg (600 mg elem calcium ) tablet  Take 1 tablet (600 mg elem calcium  total) by mouth two (2) times a day.     calcium -vitamin D 500 mg-5 mcg (200 unit) per tablet  Take 1 tablet by mouth daily.     cyanocobalamin  (vitamin B-12) 1000 MCG tablet  Take 1 tablet (1,000 mcg total) by mouth daily.     ELIQUIS  2.5 mg Tab  Generic drug: apixaban   Take 1 tablet (2.5 mg total) by mouth two (2) times a day.     furosemide  20 MG tablet  Commonly known as: LASIX   TAKE 1/2 TO 1 (ONE-HALF TO ONE) TABLET BY MOUTH ONCE DAILY AS NEEDED FOR  SWELLING  GARLIC ORAL  Take by mouth.     ipratropium-albuterol  0.5-2.5 mg/3 mL nebulizer  Commonly known as: DUO-NEB  Inhale 3 mL by nebulization two (2) times a day.     levalbuterol  0.63 mg/3 mL nebulizer solution  Commonly known as: XOPENEX   Inhale 3 mL (0.63 mg total) by nebulization every six (6) hours as needed for wheezing or shortness of breath.     losartan  100 MG tablet  Commonly known as: COZAAR   Take 1/2 (one-half) tablet by mouth once daily     oxyCODONE -acetaminophen  5-325 mg per tablet  Commonly known as: PERCOCET   Take 1 tablet by mouth every six (6) hours as needed for pain.     simvastatin  10 MG tablet  Commonly known as: ZOCOR   Take 1 tablet by mouth once daily     tamsulosin  0.4 mg capsule  Commonly known as: FLOMAX   Take 1 capsule by mouth once daily              Allergies:  Patient has no known allergies.  ______________________________________________________________________  Pending Test Results (if blank, then none):      Most Recent Labs:  All lab results last 24 hours -   Recent Results (from the past 24 hours)   Magnesium  Level    Collection Time: 09/10/24  5:29 AM   Result Value Ref Range    Magnesium  1.7 1.6 - 2.6 mg/dL     CBC - Results in Past 30 Days  Result Component Current Result Ref Range Previous Result Ref Range   HCT 32.5 (L) (09/09/2024) 39.0 - 48.0 % 32.4 (L) (09/08/2024) 39.0 - 48.0 %   HGB 11.4 (L) (09/09/2024) 12.9 - 16.5 g/dL 88.6 (L) (0/82/7974) 87.0 - 16.5 g/dL   MCH 66.0 (H) (0/81/7974) 25.9 - 32.4 pg 33.7 (H) (09/08/2024) 25.9 - 32.4 pg   MCHC 35.0 (09/09/2024) 32.0 - 36.0 g/dL 65.2 (0/82/7974) 67.9 - 36.0 g/dL   MCV 03.0 (H) (0/81/7974) 77.6 - 95.7 fL 96.9 (H) (09/08/2024) 77.6 - 95.7 fL   MPV 7.3 (09/09/2024) 6.8 - 10.7 fL 7.3 (09/08/2024) 6.8 - 10.7 fL   Platelet 196 (09/09/2024) 150 - 450 10*9/L 183 (09/08/2024) 150 - 450 10*9/L   RBC 3.35 (L) (09/09/2024) 4.26 - 5.60 10*12/L 3.34 (L) (09/08/2024) 4.26 - 5.60 10*12/L   WBC 8.1 (09/09/2024) 3.6 - 11.2 10*9/L 7.6 (09/08/2024) 3.6 - 11.2 10*9/L     BMP - Results in Past 30 Days  Result Component Current Result Ref Range Previous Result Ref Range   BUN 16 (09/09/2024) 9 - 23 mg/dL 19 (0/82/7974) 9 - 23 mg/dL   Chloride 98 (0/81/7974) 98 - 107 mmol/L 98 (09/08/2024) 98 - 107 mmol/L   CO2 26.8 (09/09/2024) 20.0 - 31.0 mmol/L 26.4 (09/08/2024) 20.0 - 31.0 mmol/L   Creatinine 1.03 (09/09/2024) 0.73 - 1.18 mg/dL 8.96 (0/82/7974) 9.26 - 1.18 mg/dL   Glucose 890 (0/81/7974) 70 - 179 mg/dL 879 (0/82/7974) 70 - 820 mg/dL   Potassium 4.1 (0/81/7974) 3.4 - 4.8 mmol/L 4.5 (09/08/2024) 3.4 - 4.8 mmol/L   Sodium 136 (09/09/2024) 135 - 145 mmol/L 135 (09/08/2024) 135 - 145 mmol/L       Relevant Studies/Radiology (if blank, then none):  ECG 12 Lead  Result Date: 09/10/2024  **POOR DATA QUALITY, INTERPRETATION MAY BE ADVERSELY AFFECTED ATRIAL FIBRILLATION WITH RAPID VENTRICULAR RESPONSE LOW VOLTAGE QRS SEPTAL INFARCT  (CITED ON OR BEFORE 07-Sep-2024) ABNORMAL ECG WHEN COMPARED WITH ECG OF 07-Sep-2024 12:09, ATRIAL FIBRILLATION HAS REPLACED SINUS RHYTHM QRS DURATION HAS  INCREASED QUESTIONABLE CHANGE IN INITIAL FORCES OF ANTERIOR LEADS NONSPECIFIC T WAVE ABNORMALITY, WORSE IN INFERIOR LEADS    ECG 12 Lead  Result Date: 09/08/2024  NORMAL SINUS RHYTHM LOW VOLTAGE QRS SEPTAL INFARCT  ABNORMAL ECG WHEN COMPARED WITH ECG OF 04-Sep-2024 12:46, SIGNIFICANT CHANGES HAVE OCCURRED    Echocardiogram W Colorflow Spectral Doppler With Contrast  Result Date: 09/07/2024  Patient Info Name:     ARTYOM STENCEL Age:     53 years DOB:     12/09/1934 Gender:     Male MRN:     [id number] Accession #:     [id number] UN Account #:     [id number] Ht:     158 cm Wt:     63 kg BSA:     1.67 m2 BP:     142 /     79 mmHg HR:     75 bpm Exam Date:     09/06/2024 9:43 AM Admit Date: 09/04/2024 Exam Type:     ECHOCARDIOGRAM W COLORFLOW SPECTRAL DOPPLER W CONTRAST Technical Quality:     Poor Reason for Poor Study:     poor echocardiographic windows Staff Sonographer:     Cletus Daring Referring Physician:     Benison Pap Mangel Reading Fellow:     Margaree Moros MD Ordering Physician:     Fairy Sheppard Fail Study Info Indications      - concern for new onset CHF ; Definity /Optison  Procedure(s)   Complete two-dimensional, color flow and Doppler transthoracic echocardiogram is performed with contrast to opacify the left ventricle and to improve the delineation of the left ventricle endocardial borders. Ultrasound Enhancing Agent/Agitated Saline ------------------------------ UEA/Ag. Saline:     Definity  Amount:     3.00 ml Existing IV Access:     Yes IV Access Condition:     patent with no signs of infiltration Summary   1. Technically difficult study.   2. The left ventricular systolic function is normal, LVEF is visually estimated at > 55%.   3. There is mild-moderate pulmonary hypertension.   4. The right ventricle is mildly dilated in size, with normal systolic function.   5. TR maximum velocity: 2.9 m/s  Estimated PASP: 50 mmHg.   6. IVC size and inspiratory change suggest elevated right atrial pressure. (10-20 mmHg). Left Ventricle   The left ventricle is normal in size with upper normal wall thickness. The left ventricular systolic function is normal, LVEF is visually estimated at > 55%. Left ventricular diastolic function cannot be accurately assessed. Right Ventricle   The right ventricle is mildly dilated in size, with normal systolic function. Left Atrium   The left atrium is upper normal in size. Right Atrium   The right atrium is mildly dilated in size. Aortic Valve   The aortic valve is trileaflet with mildly thickened leaflets with normal excursion. There is no significant aortic regurgitation. There is no evidence of a significant transvalvular gradient. Mitral Valve   The mitral valve leaflets are mildly thickened with normal leaflet mobility. Mitral annular calcification is present (mild). There is trivial mitral valve regurgitation. Tricuspid Valve   The tricuspid valve leaflets are normal, with normal leaflet mobility. There is trivial tricuspid regurgitation. There is mild-moderate pulmonary hypertension. TR maximum velocity: 2.9 m/s  Estimated PASP: 50 mmHg. Pulmonic Valve   The pulmonic valve is poorly visualized, but probably normal. There is no significant pulmonic regurgitation. There is no evidence of a significant transvalvular gradient. Aorta   The aorta is normal  in size in the visualized segments. Inferior Vena Cava   IVC size and inspiratory change suggest elevated right atrial pressure. (10-20 mmHg). Pericardium/Pleural   There is no pericardial effusion. Ventricles ---------------------------------------------------------------------- Name                                 Value        Normal ---------------------------------------------------------------------- LV Dimensions 2D/MM ----------------------------------------------------------------------  IVS Diastolic Thickness (2D)                                1.1 cm       0.6-1.0 LVID Diastole (2D)                  4.6 cm       4.2-5.8  LVPW Diastolic Thickness (2D)                                1.0 cm       0.6-1.0 LVID Systole (2D)                   3.6 cm       2.5-4.0 LVOT Diameter                       1.8 cm               LV Mass Index (2D Cubed)          102 g/m2        49-115  Relative Wall Thickness (2D)                                  0.43        <=0.42 RV Dimensions 2D/MM ---------------------------------------------------------------------- TAPSE                               1.4 cm         >=1.7 Atria ---------------------------------------------------------------------- Name                                 Value        Normal ---------------------------------------------------------------------- LA Dimensions ---------------------------------------------------------------------- LA Dimension (2D)                   5.0 cm       3.0-4.1 LA Volume Index (4C A-L)        30.86 ml/m2               LA Volume Index (2C A-L)        33.82 ml/m2               LA Volume (BP MOD)                   53 ml               LA Volume Index (BP MOD)        31.90 ml/m2   16.00-34.00 RA Dimensions ---------------------------------------------------------------------- RA Area (4C)  18.6 cm2        <=18.0 RA Area (4C) Index              11.2 cm2/m2               RA ESV Index (4C MOD)             28 ml/m2         18-32 Left Ventricular Outflow Tract ---------------------------------------------------------------------- Name                                 Value        Normal ---------------------------------------------------------------------- LVOT 2D ---------------------------------------------------------------------- LVOT Diameter                       1.8 cm               LVOT Area                          2.6 cm2               LVOT Doppler ---------------------------------------------------------------------- LVOT VTI                             11 cm               LVOT Stroke Volume                   29 ml               LVOT SI                           17 ml/m2 Aortic Valve ---------------------------------------------------------------------- Name                                 Value        Normal ---------------------------------------------------------------------- AV Doppler ---------------------------------------------------------------------- AV Peak Velocity                   1.4 m/s               AV Peak Gradient                    8 mmHg               AV Mean Gradient                    4 mmHg               AV VTI                               25 cm               AV Area (Cont Eq VTI)              1.2 cm2         >=3.0 AV Area Index (Cont Eq VTI)     0.7 cm2/m2               AV DI (VTI) 0.45 Mitral Valve ---------------------------------------------------------------------- Name  Value        Normal ---------------------------------------------------------------------- MV Diastolic Function ---------------------------------------------------------------------- MV E Peak Velocity                125 cm/s               MV Annular TDI ---------------------------------------------------------------------- MV Septal e' Velocity             7.0 cm/s         >=8.0 MV E/e' (Septal)                      18.0               MV Lateral e' Velocity           16.1 cm/s        >=10.0 MV E/e' (Lateral)                      7.8               MV e' Average                    11.5 cm/s               MV E/e' (Average)                     12.9 Tricuspid Valve ---------------------------------------------------------------------- Name                                 Value        Normal ---------------------------------------------------------------------- TV Regurgitation Doppler ---------------------------------------------------------------------- TR Peak Velocity                   2.9 m/s               Estimated PAP/RSVP ---------------------------------------------------------------------- RA Pressure                        15 mmHg           <=5 RV Systolic Pressure               50 mmHg           <36 Pulmonic Valve ---------------------------------------------------------------------- Name                                 Value        Normal ---------------------------------------------------------------------- PV Doppler ---------------------------------------------------------------------- PV Peak Velocity                   0.7 m/s Aorta ---------------------------------------------------------------------- Name                                 Value        Normal ---------------------------------------------------------------------- Ascending Aorta ---------------------------------------------------------------------- Ao Root Diameter (2D)               3.1 cm               Ao Root Diam Index (2D)          1.8 cm/m2 Venous ---------------------------------------------------------------------- Name  Value        Normal ---------------------------------------------------------------------- IVC/SVC ---------------------------------------------------------------------- IVC Diameter (Exp 2D)               2.4 cm         <=2.1 Report Signatures Finalized by Odell Debby Grew  MD on 09/07/2024 08:44 AM Resident Margaree Moros  MD on 09/06/2024 02:15 PM    XR Chest 1 view Portable  Result Date: 09/04/2024  EXAM: XR CHEST PORTABLE ACCESSION: [id number] UN REPORT DATE: 09/04/2024 11:13 AM CLINICAL INDICATION: DYSPNEA  TECHNIQUE: Single View AP Chest Radiograph. COMPARISON: XR CHEST 2 VIEWS 08/13/2024 FINDINGS: Bibasilar right greater than left hazy opacities. Small bilateral pleural effusion. No pneumothorax. Heart is normal in size. Moderate hiatal hernia     Interval new small bilateral pleural effusion with adjacent atelectasis/airspace disease. Moderate hiatal hernia. ==================== MODIFIED REPORT: (09/04/2024 6:34 PM) This report has been modified from its preliminary version; you may check the prior versions of radiology report, results history link for prior report versions. -----------------------------------------------     ECG 12 Lead  Result Date: 09/04/2024  ATRIAL FIBRILLATION RIGHT AXIS DEVIATION POOR R WAVE PROGRESSION IN PRECORDIAL LEADS NONSPECIFIC ST-T WAVE ABNORMALITIES AND T WAVE INVERSION WHEN COMPARED WITH ECG OF 04-Sep-2024 11:07, NO SIGNIFICANT CHANGE WAS FOUND Confirmed by Vinie Dunnings [id number]) on 09/04/2024 2:59:00 PM    ECG 12 Lead  Result Date: 09/04/2024  ATRIAL FIBRILLATION RIGHT AXIS DEVIATION POOR R WAVE PROGRESSION IN PRECORDIAL LEADS NONSPECIFIC ST-T WAVE ABNORMALITIES AND T WAVE INVERSION WHEN COMPARED WITH ECG OF 17-Aug-2024 12:45, QUESTIONABLE CHANGE IN QRS AXIS CHEST LEAD PLACEMENT CHANGE IS SUSPECTED Confirmed by Vinie Dunnings [phone]) on 09/04/2024 12:22:42 PM    ECG 12 Lead  Result Date: 09/04/2024  SINUS TACHYCARDIA WITH 1ST DEGREE AV BLOCK WITH PREMATURE ATRIAL BEATS RIGHT AXIS DEVIATION INCOMPLETE RIGHT BUNDLE BRANCH BLOCK ANTERIOR INFARCT  (CITED ON OR BEFORE 07-Jun-2015) ST & T WAVE ABNORMALITY, CONSIDER LATERAL ISCHEMIA ABNORMAL ECG WHEN COMPARED WITH ECG OF 17-Aug-2024 12:45, SINUS RHYTHM HAS REPLACED ATRIAL FIBRILLATION QRS AXIS SHIFTED RIGHT NONSPECIFIC T WAVE ABNORMALITY HAS REPLACED INVERTED T WAVES IN INFERIOR LEADS    ______________________________________________________________________  Discharge Instructions:               Resources and Referrals       Commode      Length of Need: 99    Type of commode: Chair Comment - 3 N 1    Height: 157.5 cm (5' 2.01)     Weight: 59.8 kg (131 lb 14.4 oz)        Height: 157.5 cm (5' 2.01)     Weight: 59.8 kg (131 lb 14.4 oz)    Walker      Type: Rolling    Wheels: 4 Wheels & Seat    Length of Need: 99    Height: 157.5 cm (5' 2.01)     Weight: 59.8 kg (131 lb 14.4 oz)        Height: 157.5 cm (5' 2.01)     Weight: 59.8 kg (131 lb 14.4 oz)      Home health has been set up for through the agency listed below. The Home health agency will be contacting you to set up a time for them to come see you in your home within 2 days of your discharge.  If you have not heard from them prior to 09/12/24 or you have any questions about home health, please contact them at the phone number listed below.  Durable Medical Equipment - Discharged on 09/10/2024 Admission date: 09/04/2024 - Discharge disposition: Home with Self Care      Service Provider Services Address Phone Fax Patient Preferred    [facility] - D[facility]  Durable Medical Equipment [address] [place], [place] [address] [phone] --          Home Care [facility]  - Discharged on 09/10/2024 Admission date: 09/04/2024 - Discharge disposition: Home with Self Care      Service Provider Services Address Phone Fax Patient Preferred    [facility] - [facility] [address] [place] [address] [phone] --                     Follow Up instructions and Outpatient Referrals     Ambulatory Referral to Cardiology      Reason for referral: afib with RVR; CAD    Specific Service Requested: General Cardiology    Requested follow up plan: You would evaluate and manage.    Ambulatory Referral to Home Health      Reason for referral: HH    Is this a Personnel officer or [employer] Patient?: Yes    Home Health Options: Traditional Home Health    Is this patient at high risk for COVID 19 transmission and recommended to   stay at home during this pandemic?: No    Do you want agency provider parameter notifications or patient specific   provider parameter notifications?: Agency    Do you want to initiate remote patient monitoring?: No    Physician to follow patient's care: Referring Provider    Disciplines requested:  Occupational Therapy  Physical Therapy       Physical Therapy requested: Evaluate and treat    Occupational Therapy Requested: Evaluate and treat    Requested SOC Date:  Comment - 48 hrs after discharge        Appointments which have been scheduled for you      Oct 08, 2024 10:40 AM  (Arrive by 10:25 AM)  PFT with PFT 2  [facility] [place]) [address]  [place] [address]  [place]  [address]  [phone]   If you have inhaled breathing medications, please do not use it 4 hours prior to this breathing test.  Please wear comfortable clothing and well-fitting , closed toe shoes in the even that a 6-minute walk is part of your test.  If you have fallen in the past month please inform your respiratory therapist at the start of your appointment.    While you may bring a guest to your appointment, they will not be able to be present in the testing area.         Oct 14, 2024 11:20 AM  (Arrive by 11:05 AM)  RETURN CONTINUITY with Benison Pap Mangel, DO  [facility] PRIMARY CARE [address] AT [facility]) [address] [place] [address]  [phone]        Oct 22, 2024 11:00 AM  (Arrive by 10:45 AM)  NEW PULMONARY with Carlin Yancy Skates, MD  [facility] PULMONARY SPECIALTY CL [place] [facility] REGION) [address]  [facility] 1 through 4  [place] [address]  [phone]        Nov 03, 2024 11:00 AM  (Arrive by 10:45  AM)  RETURN RHEUMATOLOGY with Leeroy Alan Cleverly, MD  [facility] RHEUMATOLOGY SPECIALTY [place] [facility]) [address]  [facility] 1 through 4  [place] [address]  [phone]        Nov 24, 2024 8:30 AM  (Arrive by 8:15 AM)  OFFICE VISIT with Damien Brodie Lee, AGNP  [facility] CARDIOLOGY [place] [facility] REGION) [address]  [facility] 1 through 4  [place] [address]  [phone]        Jan 04, 2025 9:40 AM  (Arrive by 9:25 AM)  OFFICE VISIT with Anil Kishin Gehi, MD  [facility] CARDIOLOGY [place] [facility] REGION) [address]  [facility] 1 through 4  [place] [address]  [phone]             ______________________________________________________________________  Discharge Day Services:  BP 154/79  - Pulse 78  - Temp 36.1 ??C (97 ??F) (Oral)  - Resp 18  - Ht 157.5 cm (5' 2.01)  - Wt 59.8 kg (131 lb 14.4 oz)  - SpO2 93%  - BMI 24.12 kg/m??   Pt seen on the day of discharge and determined appropriate for discharge.    GEN: well appearing, lying in bed, NAD, coughing intermittently  HEENT: NCAT, MMM. EOMI.  CV: Regular rate and rhythm. No murmurs/rubs/gallops.  Pulm: CTAB. No wheezing, crackles, or rhonchi.  Abd: Soft, non-tender, non-distended  Neuro: A&O x 3. No focal deficits.   Ext: No peripheral edema.    Condition at Discharge: good    Length of Discharge: I spent greater than 30 mins in the discharge of this patient.     [name], MS3    I attest that I have reviewed the student note, and that the components of the history of the present illness, the physical exam, and the assessment and plan documented were performed by me or were performed in my presence by the student where I verified the documentation and performed (or re-performed) the exam and medical decision making.     Alyce Pipes, MD PGY2

## 2024-09-10 NOTE — Unmapped (Signed)
 Shift Summary  OxyCODONE -acetaminophen  was administered PRN for pain, resulting in a decrease in pain score from 6 to 3.  Fall prevention strategies were maintained, including frequent toileting, hourly visual checks, and bed alarm activation, with no falls documented.  Respiratory assessments and chest physiotherapy were done.  Skin integrity interventions such as absorbent pad changes, pressure-redistributing mattress, and heel elevation were consistently implemented to address bruising and erythema.  Throughout the shift, vital signs and cardiac rhythm were monitored, and overall status remained stable with no acute events or injuries reported.    Absence of Fall and Fall-Related Injury: Fall prevention interventions such as low bed positioning, bed alarm, frequent toileting, and hourly visual checks were consistently maintained throughout the shift, with no documented falls or injuries.    Effective Breathing Pattern: Respiratory pattern remained regular and unlabored, and oxygen  was maintained on room air; breath sounds were diminished bilaterally before and after treatment, with no significant change noted during the shift.    Normalized Cardiac Rhythm: Cardiac rhythm was documented as normal sinus rhythm and telemetry monitoring was maintained throughout the shift.    Skin Health and Integrity: Bruising and nonblanchable erythema were present and scattered at the start of the shift; skin pressure protection and pressure reduction devices were utilized, and heels were kept elevated off the bed throughout the shift.

## 2024-09-11 MED ORDER — PREDNISONE 5 MG TABLET
ORAL_TABLET | ORAL | 0 refills | 12.00000 days | Status: CN
Start: 2024-09-11 — End: 2024-09-23

## 2024-09-11 MED ORDER — PREDNISONE 10 MG TABLET
ORAL_TABLET | ORAL | 0 refills | 9.00000 days | Status: CP
Start: 2024-09-11 — End: 2024-09-20
  Filled 2024-09-10: qty 18, 9d supply, fill #0

## 2024-09-13 NOTE — Unmapped (Unsigned)
 Copied from CRM #[id number]. Topic: Scheduling - New Appointment  >> Sep 13, 2024 10:15 AM Toni DEL wrote:  Caller asked to schedule a new appointment      Additional Requests/Needs: Yes Appointment Related: Hospital Admission appointment the first available is > 14 days out. Patient was discharged on 9/19. The patient preferred contact: Cell Phone Telephone Information:  Mobile          [phone]   Urgent callback turnaround time: within 24 business hours. Programmer, systems Notified)

## 2024-09-16 NOTE — Unmapped (Signed)
 [facility]struct[facility] Diseases Outpatient Clinic  New Patient Evaluation Note    Assessment:      Patient:Erik [name] (1934/09/14)    Erik [name] is a 89 y.o. male who is seen in consultation at the request of Mangel, Benison Pap, DO for comprehensive evaluation of COPD.  The patient has symptoms and smoking history consistent with a diagnosis of COPD.  He has not formally had spirometry testing, but his clinical picture is consistent with this diagnosis.  He also has atrial fibrillation with rapid ventricular response.  He notes marked improvement in his symptoms since being optimized from a heart rate perspective as well as being transition to budesonide  nebulizer and 3 times daily levalbuterol .     COPD, clinically diagnosed (no spirometry), improved  - Continue budesonide  nebulizer twice daily  - Continue levalbuterol  nebulizer 3 times daily  - Deferred spirometry today given unlikely to change current medical management  - No indication for oxygen  therapy based on recent walk test during hospitalization  - Patient not able to attend pulmonary rehabilitation  - Deferred vaccinations today.  Patient plans to get vaccinations locally    Atrial fibrillation with rapid ventricular response  - Suspect that dyspnea symptoms are likely related to both previously uncontrolled heart rate and underlying COPD  - Encourage patient to continue follow-up with cardiology  - Patient will continue on Eliquis  (expressed frustration about cost)    Given good symptom control, patient can be seen back in our clinic on an as-needed basis.  He and his granddaughter were amenable to these plans.    Thank you for allowing me to participate in the care of this patient.  EMERSON Adine Coach, MD  Associate Professor, Pulmonary Diseases and Critical Care Medicine  [employer]  at [facility]  [email or web address]    For Appointments: [phone]   For the Pulmonary Nurse [place]): [phone]  Clinic Fax: Fax: [phone]  Mailing address for CDs/disks/files:  EMERSON Adine Coach, MD  [facility] [phone], CB# [phone]                                                         [address]                                                                   [place], [place] [address]          Plan:      Problem List Items Addressed This Visit          Respiratory    COPD (chronic obstructive pulmonary disease)    (CMS-HCC) - Primary     Other Visit Diagnoses         History of tobacco use              The above plan was discussed with the patient and he is in agreement.  Return if symptoms worsen or fail to improve.       Subjective:      HPI: Erik [name] is a 89 y.o. male who presents for evaluation of COPD.  The patient has a past medical history noteworthy for former tobacco use, clinical diagnosis of COPD (no prior confirmatory spirometry), coronary artery disease status post CABG, atrial fibrillation with history of RVR on DOAC, history of GCA and BPH.  He is referred for assistance with optimization of therapy given cost of commercially available inhalers.  The patient has most recently been hospitalized 9/13 - 09/10/2024 at [facility] for acute exacerbation of COPD.  At that hospitalization he required noninvasive ventilation with BiPAP.  He also was noted to be in atrial fibrillation with rapid ventricular response with elevated proBNP.  Despite this, it was felt his symptoms are most consistent with a COPD exacerbation.  He received prednisone  and doxycycline  and was discharged on budesonide  and DuoNeb.    The patient is accompanied today by his granddaughter.  He states that he smoked from age 3 until age 72 with a 25-year quit.,  On average 1 pack/day, approximating a 42-pack-year smoking history.  He was diagnosed with COPD approximately 3 years ago during a hospitalization.  He is unable to recall why he was hospitalized.  He was discharged on oxygen  at that time but states he did not use it.  Prior to his most recent hospitalization, on a typical day he would have shortness of breath when going from the bedroom to the bathroom.  He had cough, wheeze and no mucus production.  Due to worsening symptoms, he did present to the hospital 9/13 and was admitted for 6 days for treatment of COPD exacerbation as well as atrial fibrillation with rapid ventricular response.  At that time he was discharged on budesonide  nebulizer twice daily and levalbuterol  3 times daily.  He notes that on this regimen he is doing markedly improved.  He has no mucus production and decreased cough.  His shortness of breath has significantly improved.  He also notes that his heart rate is doing much better.  He continues to use incentive spirometer.  He does not have much phlegm production.    He has not yet received influenza and COVID vaccination but is planning on getting these later this month.  He does live by himself.  He is a former Curator.  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  He denies any trouble with seasonal allergies.  He has no pets at home.     COPD Exacerbation History  10/2021: Hospitalized [facility] exacerbation  9/13 - 09/10/2024: Hospitalization    Symptom Assessment      Most recent MMRC dyspnea scale: 2 Last MMRC date: 09/20/2024    Current inhaler regimen  Albuterol  MDI (ProAir , Proventil , Ventolin , Xopenex ) and Pulmicort  nebulized (budesonide )    Medication Adherence   Adherent all of the time.    Alpha-1 Testing  Deferred    Smoking Cessation Status  Already quit. Encouraged continued abstinence.    Oxygen  Requirement and Use:  Date of last assessment: 09/10/24 hospitalization  Oxygen  saturation on room air with patient at rest = 93%  Oxygen  saturation on room air with exertion / ambulation = 93%  Oxygen  saturation with exertion / ambulating on oxygen  = 92% on 0 lpm   No oxygen  requirements.    Pulmonary Rehab Status:  Closest Site: [employer] (lives in [place])  The patient is not a candidate for pulmonary rehab.    Lung Cancer Screening  TThe USPSTF recommends annual screening for lung cancer with low-dose computed tomography in adults ages 58 to 14 years who have a 20 pack-year smoking history  and currently smoke or have quit within the past 15 years. Screening should be discontinued once a person has not smoked for 15 years or develops a health problem that substantially limits life expectancy or the ability or willingness to have curative lung surgery. The patient does not meet criteria for lung cancer screening according to the USPSTF guidelines based on age.     Indication for NIPPV*  Last blood gas: VBG 09/04/2024 7.36/37.  Last serum bicarbonate 09/09/24  27  No components found for: ABG  *General Indication: stable pCO2>45 or serum HCO3 >30  *Trilogy NIV Indications: (pCO2>51 or HCO3 >31) or (pCO2 48-51 with two COPD hospitalizations in last year)     Vaccinations  Most Recent Immunizations   Administered Date(s) Administered    COVID-19 VACCINE,MRNA(MODERNA)(PF) 05/23/2020    INFLUENZA IIV3 HIGH DOSE 35YRS+(FLUZONE) 11/05/2023    Influenza Vaccine Quad(IM)6 MO-Adult(PF) 10/17/2022    Influenza Virus Vaccine, unspecified formulation 10/17/2022    PNEUMOCOCCAL POLYSACCHARIDE 23-VALENT 12/23/2009    Pneumococcal Conjugate 13-Valent 04/06/2015       Endobronchial valve candidate (criteria listed below)  No. Reason: Age.  Must meet all criteria   DEMOGRAPHICS AND GENERAL SCREENING   Age 10 to 58 years   Nonsmoker status for 4 months   BMI < 35 kg/m2   Clinically stable on Prednisone  <=20 mg/day   <2 COPD exacerbations with hospitalization in prior year   mMRC 2+   Completed pulm rehab program (if feasible)   Not on antiplatelet/anticoagulation therapy (except 81mg  Asa)      PULMONARY FUNCTION TESTING   Post-BD FEV1/FVC < LLN   Post-BD FEV1 15-45% predicted   TLC > 100% predicted   RV > 175% predicted   DLCO > 20% predicted   between 100-500 m ([phone] ft) for Heterogenous Disease or 150-500 m for homogenous disease     Environmental Exposures:  None    Pets at home:  none    Past Medical History[1]      Past Surgical History[2]      Family History[3]      Short Social History[4]      Allergies as of 09/20/2024    (No Known Allergies)       Current Medications[5]          Objective:   Objective   Vitals:    09/20/24 1129   BP: 124/61   Pulse: 103   Temp: 35.6 ??C (96 ??F)   SpO2: 96%     Body mass index is 23.04 kg/m??.  Wt Readings from Last 6 Encounters:   09/20/24 57.2 kg (126 lb)   09/10/24 59.8 kg (131 lb 14.4 oz)   08/26/24 62.6 kg (138 lb)   08/17/24 60.7 kg (133 lb 12.8 oz)   06/08/24 60.8 kg (134 lb)   02/03/24 60.3 kg (133 lb)     General:  Non-toxic. NAD. Non-dyspneic.  Breathing comfortably.  No coughing.  Alert.  Eyes: PERRL. Anicteric sclera.   ENT: No thrush.  Lymph: No cervical or supraclavicular lymphadenopathy.  Lungs: Clear bilaterally with decreased breath sounds in upper lobes.  No wheezes noted.  No adventitial breath sounds.  Cardiovascular: Regular rate and rhythm. No murmurs, rubs or gallops. Normal P2, no RV heave. No edema  Musculoskeletal/extremities: Normal muscle tone and strength. No cyanosis. No clubbing.   Skin: No skin rashes.  Neuro: Alert and oriented x 3. CN grossly intact. No focal neurological deficits     Diagnostic Review:  Pulmonary Function Testing   Defer    Laboratory Data  Lab Results   Component Value Date    WBC 8.1 09/09/2024    HGB 11.4 (L) 09/09/2024    HCT 32.5 (L) 09/09/2024    PLT 196 09/09/2024     Lab Results   Component Value Date    EOSABS 0.1 09/04/2024    EOSABS 0.1 08/26/2024    EOSABS 0.3 08/13/2024       Chemistry        Component Value Date/Time    NA 136 09/09/2024 0529    NA 135 01/12/2015 0840    K 4.1 09/09/2024 0529    K 4.3 01/12/2015 0840    CL 98 09/09/2024 0529    CL 96 (L) 01/12/2015 0840    CO2 26.8 09/09/2024 0529    CO2 27 01/12/2015 0840    BUN 16 09/09/2024 0529    BUN 10 01/12/2015 0840    CREATININE 1.03 09/09/2024 0529    CREATININE 0.8 10/11/2017 0920    GLU 109 09/09/2024 0529        Component Value Date/Time    CALCIUM  9.5 09/09/2024 0529    CALCIUM  10.0 01/12/2015 0840    ALKPHOS 44 (L) 09/04/2024 0953    AST 39 (H) 09/04/2024 0953    ALT 16 09/04/2024 0953    BILITOT 0.7 09/04/2024 0953          Imaging  Chest X-Ray: 09/04/24  Bibasilar right greater than left hazy opacities. Small bilateral pleural effusion. No pneumothorax.Heart is normal in size. Moderate hiatal hernia    Chest CT: None available for review    TTE 09/06/24    1. Technically difficult study.    2. The left ventricular systolic function is normal, LVEF is visually estimated at > 55%.    3. There is mild-moderate pulmonary hypertension.    4. The right ventricle is mildly dilated in size, with normal systolic function.    5. TR maximum velocity: 2.9 m/s  Estimated PASP: 50 mmHg.    6. IVC size and inspiratory change suggest elevated right atrial pressure. (10-20 mmHg).    Laboratory data and radiographic imaging described above were personally reviewed personally by me in association with today's clinic encounter.  As part of this visit, I personally reviewed prior external notes from [facility] and referral documentation on the day of encounter.   I provided independent interpretation of tests performed by other providers (including CT imaging) obtained since the last visit.     I am located on-site and the patient is located on-site for this visit.      I personally spent 60 minutes face-to-face and non-face-to-face in the care of this patient, which includes all pre, intra, and post visit time on the date of service.    Portions of this record have been created using Scientist, clinical (histocompatibility and immunogenetics). Dictation errors have been sought, but may not have been identified and corrected.    Ozell KATHEE Coach, MD  09/20/24  12:25 PM         [1]   Past Medical History:  Diagnosis Date    Cataract 2005    cataract surgery    COPD (chronic obstructive pulmonary disease)    (CMS-HCC) 10/07/2016    DJD (degenerative joint disease), lumbar     Hypertension     Macular degeneration of right eye 10/17/2022    Mild anemia 01/15/2022    Tobacco abuse 02/15/2014   [2]   Past  Surgical History:  Procedure Laterality Date    CARDIAC VALVE REPLACEMENT      triple bypass    CATARACT EXTRACTION      CORONARY ARTERY BYPASS GRAFT  2001    x3    EYE SURGERY  2005    cataracts    PR CARDIOVERSION, ELECTIVE;EXTERN N/A 09/08/2024    Procedure: CARDIOVERSION, ELECTIVE, ELECTRICAL CONVERSION OF ARRHYTHMIA; EXTERNAL;  Surgeon: Jonette Maude PARAS, MD;  [facility];  Service: Cardiology    PR REPAIR ING HERNIA,5+Y/O,REDUCIBL Left 06/19/2015    Procedure: REPAIR INITIAL INGUINAL HERNIA, AGE 29 YEARS OR OLDER; REDUCIBLE;  Surgeon: Curtistine DELENA Poisson, MD;  [facility];  Service: Trauma    PR TEMPORAL ARTERY LIGATN OR BX Left 10/23/2017    Procedure: LIGATION OR BIOPSY, TEMPORAL ARTERY;  Surgeon: Fairy Lum Gaskins, MD;  [facility];  Service: ENT    PR UPPER GI ENDOSCOPY,DIAGNOSIS N/A 12/17/2017    Procedure: UGI ENDO, INCLUDE ESOPHAGUS, STOMACH, & DUODENUM &/OR JEJUNUM; DX W/WO COLLECTION SPECIMN, BY BRUSH OR WASH;  Surgeon: Sean Easterly, MD;  [facility];  Service: Gastroenterology   [3]   Family History  Problem Relation Age of Onset    Thyroid disease Sister     Cancer Brother     Glaucoma Neg Hx     Macular degeneration Neg Hx     Retinal detachment Neg Hx     Substance Abuse Disorder Neg Hx     Mental illness Neg Hx    [4]   Social History  Tobacco Use    Smoking status: Former     Current packs/day: 0.00     Types: Cigarettes    Smokeless tobacco: Never    Tobacco comments:     quit for 25 years, restarted in 2014. Denies any use of tobacco at this time   Vaping Use    Vaping status: Never Used   Substance Use Topics    Alcohol use: Yes     Alcohol/week: 1.0 standard drink of alcohol     Types: 1 Cans of beer per week     Comment: daily    Drug use: No   [5]   Current Outpatient Medications   Medication Sig Dispense Refill    amlodipine  (NORVASC ) 10 MG tablet Take 1 tablet (10 mg total) by mouth daily.      apixaban  (ELIQUIS ) 2.5 mg Tab Take 1 tablet (2.5 mg total) by mouth two (2) times a day. 60 tablet 3    ascorbic acid  (VITAMIN C ) 500 MG tablet Take 1 tablet (500 mg total) by mouth daily.      budesonide  (PULMICORT ) 0.5 mg/2 mL nebulizer solution Inhale 2 mL (0.5 mg total) by nebulization two (2) times a day. 120 mL 11    calcium  carbonate (OS-CAL) 1,500 mg (600 mg elem calcium ) tablet Take 1 tablet (600 mg elem calcium  total) by mouth two (2) times a day.      calcium -vitamin D 500 mg-5 mcg (200 unit) per tablet Take 1 tablet by mouth daily.      cyanocobalamin , vitamin B-12, 1000 MCG tablet Take 1 tablet (1,000 mcg total) by mouth daily.      doxycycline  (VIBRA -TABS) 100 MG tablet Take 1 tablet (100 mg total) by mouth two (2) times a day for 1 dose. 1 tablet 0    furosemide  (LASIX ) 20 MG tablet TAKE 1/2 TO 1 (ONE-HALF TO ONE) TABLET BY MOUTH ONCE  DAILY AS NEEDED FOR  SWELLING 90 tablet 0    GARLIC ORAL Take by mouth.      [Paused] hydrALAZINE  (APRESOLINE ) 25 MG tablet Take 1 tablet by mouth once daily 90 tablet 0    ipratropium-albuterol  (DUO-NEB) 0.5-2.5 mg/3 mL nebulizer Inhale 3 mL by nebulization two (2) times a day.      levalbuterol  (XOPENEX ) 0.63 mg/3 mL nebulizer solution Inhale 3 mL (0.63 mg total) by nebulization every six (6) hours as needed for wheezing or shortness of breath. 3 mL 2    losartan  (COZAAR ) 100 MG tablet Take 1/2 (one-half) tablet by mouth once daily 45 tablet 0    metoPROLOL  tartrate (LOPRESSOR ) 50 MG tablet Take 1 tablet (50 mg total) by mouth two (2) times a day. 180 tablet 0    oxyCODONE -acetaminophen  (PERCOCET ) 5-325 mg per tablet Take 1 tablet by mouth every six (6) hours as needed for pain. 120 tablet 0    predniSONE  (DELTASONE ) 10 MG tablet Take 3 tablets (30 mg total) by mouth daily for 3 days, THEN 2 tablets (20 mg total) daily for 3 days, THEN 1 tablet (10 mg total) daily for 3 days. 18 tablet 0    simvastatin  (ZOCOR ) 10 MG tablet Take 1 tablet by mouth once daily 90 tablet 0    tamsulosin  (FLOMAX ) 0.4 mg capsule Take 1 capsule by mouth once daily 90 capsule 0     No current facility-administered medications for this visit.

## 2024-09-17 DIAGNOSIS — R0600 Dyspnea, unspecified: Principal | ICD-10-CM

## 2024-09-20 ENCOUNTER — Inpatient Hospital Stay: Admit: 2024-09-20 | Discharge: 2024-09-20 | Payer: Medicare (Managed Care)

## 2024-09-20 ENCOUNTER — Ambulatory Visit: Admit: 2024-09-20 | Discharge: 2024-09-20 | Payer: Medicare (Managed Care)

## 2024-09-20 DIAGNOSIS — Z87891 Personal history of nicotine dependence: Principal | ICD-10-CM

## 2024-09-20 DIAGNOSIS — J449 Chronic obstructive pulmonary disease, unspecified: Principal | ICD-10-CM

## 2024-09-20 NOTE — Unmapped (Addendum)
 Continue budesonide  neb twice daily  Continue levalbuterol  nebulizer 3 times per day  Keep your cardiology appointment  Given your good breathing control, we can see you as needed    Please take your medications as prescribed.      Thank you for allowing me to be a part of your care. Please call the clinic with any questions.     For more information about your lung disease and general lung health, visit [email or web address]    Between appointments, you can reach us  at these numbers:    In[place] ([facility] Nurse): [phone]     For Appointments: [phone]     For after hours urgent issues only: Call the hospital operator [phone]) and ask for the Pulmonary Fellow On Call    Clinic Fax: Fax: [phone]    Mailing address for CDs/disks/files:    M. Adine Coach, MD  [facility] [phone], CB# [phone]                                                         [address]                                                                   [place], [place] [address]           Are you interested in being part of research on lung disease?   If so, visit [email or web address] or scan the QR code below:      Some information about contacting us  through My [place] Chart:    My [place] Chart is for non-urgent messages. This means you have a simple medical question that does not require an immediate response.    If you need immediate attention, call 911. If you have a non-emergent but urgent issue that you feel needs a response the same day, you should contact your primary care provider or be evaluated at an Urgent Care clinic.    Responses may take up to 2 business days. Your message will be read by your provider or another medical team member who may respond on your provider's behalf. We will do our best to respond as quickly as possible, as your message is important to us . However, many of our providers have other duties (inpatient hospital work, Producer, television/film/video activities, teaching) that may make them not available for same day responses. If you have sent a MyChart message to the clinic and have not received a response after three business days, please call us  at [phone] to speak to a nurse.    Some questions cannot be answered through messages in My [facility] Chart. Depending on your question, your provider's office may ask you to schedule an appointment.     Information sent through My [facility] Chart will become part of your medical record.    Thank you for your visit to the [facility] Pulmonary Clinics.  You may receive a survey from [facility] regarding your visit today, and we are eager to use this feedback to improve your experience.  Thank you for taking the time to fill it out.

## 2024-09-21 MED ORDER — APIXABAN 2.5 MG TABLET
ORAL_TABLET | Freq: Two times a day (BID) | ORAL | 3 refills | 30.00000 days | Status: CP
Start: 2024-09-21 — End: ?

## 2024-09-21 NOTE — Unmapped (Signed)
 Please advise patient that I will provide a refill but if he wishes to change to a high dose aspirin , I recommend to discuss further with his cardiologist
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
## 2024-09-28 DIAGNOSIS — J441 Chronic obstructive pulmonary disease with (acute) exacerbation: Principal | ICD-10-CM

## 2024-09-28 DIAGNOSIS — I4811 Longstanding persistent atrial fibrillation: Principal | ICD-10-CM

## 2024-09-28 DIAGNOSIS — I10 Essential (primary) hypertension: Principal | ICD-10-CM

## 2024-09-28 DIAGNOSIS — I2510 Atherosclerotic heart disease of native coronary artery without angina pectoris: Principal | ICD-10-CM

## 2024-09-28 DIAGNOSIS — R9431 Abnormal electrocardiogram [ECG] [EKG]: Principal | ICD-10-CM

## 2024-09-28 DIAGNOSIS — Z951 Presence of aortocoronary bypass graft: Principal | ICD-10-CM

## 2024-09-28 DIAGNOSIS — E7849 Other hyperlipidemia: Principal | ICD-10-CM

## 2024-09-28 DIAGNOSIS — Z09 Encounter for follow-up examination after completed treatment for conditions other than malignant neoplasm: Principal | ICD-10-CM

## 2024-09-28 DIAGNOSIS — I4891 Unspecified atrial fibrillation: Principal | ICD-10-CM

## 2024-09-28 MED ORDER — PREDNISONE 10 MG TABLET
ORAL_TABLET | ORAL | 0 refills | 16.00000 days | Status: CP
Start: 2024-09-28 — End: 2024-10-14

## 2024-09-28 MED ORDER — METOPROLOL TARTRATE 75 MG TABLET
ORAL_TABLET | Freq: Two times a day (BID) | ORAL | 6 refills | 0.00000 days | Status: CP
Start: 2024-09-28 — End: 2025-11-02

## 2024-09-28 MED ORDER — LOSARTAN 50 MG TABLET
ORAL_TABLET | Freq: Every day | ORAL | 3 refills | 90.00000 days | Status: CP
Start: 2024-09-28 — End: ?

## 2024-09-28 NOTE — Unmapped (Addendum)
 Thank you for coming to see us  today at [facility] Cardiology at [facility]. Please call us  at [phone] if you have any questions.     Increase metoprolol  to 1 1/2 tablets twice a day. Otherwise, you look good !

## 2024-09-28 NOTE — Unmapped (Addendum)
 Increasing shortness of breath, wheezing, since stopping prednisone  last week  Will restart prednisone  with a longer taper over 16 days, follow up in 2 wks in clinic to reevaluate before weaning off prednisone   Continue levoalbuterol prn and budesonide  bid      Orders:    predniSONE  (DELTASONE ) 10 MG tablet; Take 4 tablets (40 mg total) by mouth daily for 3 days, THEN 2 tablets (20 mg total) daily for 3 days, THEN 1 tablet (10 mg total) daily for 10 days.

## 2024-09-28 NOTE — Unmapped (Addendum)
 Good control on current meds  Consider cutting amlodipine  dose in half if BP running low next visit  Continue daily home checks    Information on home health given to Erik [name] and his granddaughter - discussed with them to call to get on home health schedule - referral was placed on discharge  Does need help with bathing, discuss PCS application next visit

## 2024-09-28 NOTE — Unmapped (Signed)
 Assessment and Plan:     Assessment & Plan  COPD exacerbation    (CMS-HCC)  Increasing shortness of breath, wheezing, since stopping prednisone  last week  Will restart prednisone  with a longer taper over 16 days, follow up in 2 wks in clinic to reevaluate before weaning off prednisone   Continue levoalbuterol prn and budesonide  bid      Orders:    predniSONE  (DELTASONE ) 10 MG tablet; Take 4 tablets (40 mg total) by mouth daily for 3 days, THEN 2 tablets (20 mg total) daily for 3 days, THEN 1 tablet (10 mg total) daily for 10 days.    Longstanding persistent atrial fibrillation    (CMS-HCC)  Tachycardic today, saw cards - increased metoprolol  to 75 bid from 50 bid  Discussed stopping atenolol   Bring all med bottles to next visit           Essential hypertension, benign  Good control on current meds  Consider cutting amlodipine  dose in half if BP running low next visit  Continue daily home checks    Information on home health given to Erik [name] and his granddaughter - discussed with them to call to get on home health schedule - referral was placed on discharge  Does need help with bathing, discuss PCS application next visit         No follow-ups on file.     Subjective:     HPI: Erik [name] is a 89 y.o. male here for an Hospitalization Follow-up (States he is having a difficult time breathing lately. States he has already had to use his ).     Erik [name] is a 89 y.o. male  with Afib on DOAC, CAD s/p CABG, HTN, COPD who was admitted to [facility] for acute hypoxic respiratory failure due to COPD exacerbation - hospitalized at [facility] 9/13-9/19  Discharged with a 9d prednisone  taper and one additional day of doxy  Did well following discharge, completed prednisone  course about a week ago   Since then wheezing and congestion have worsened  Very short of breath, particularly in the middle of the night waking gasping for air - resolves with levalbuterol  nebs  Taking budesonide  nebs bid, levalbuterol  prn, has already used nebulizer 3x this morning due to wheezing and shortness of breath  Coughing up clear mucous/saliva - no purulent phlegm  No fever    Weight down since discharge (59.8kg on discharge 57.2 in clinic today), LE edema mild and improved since hospitalizatoin  Has been checking BP and pulse at home  BP 100-110s systolic at home, pulse range from 70s-110 on home checks  No chest pains  Took metoprolol  this morning  Did not stop atenolol  - picked up refill and thought he should continue taking it  Is off hydralazine      Home health referral placed on discharge but he hasn't heard anything from them yet  Lives alone  Granddaughter Erik [name] checks on him every other day when not working  Could use help with bathing   Has shower chair but doesn't fit in shower well  Using walker at home and wheelchair in clinic    Social History[1]    ROS:   Review of systems negative unless otherwise noted as per HPI.      Objective:     Vitals:    09/28/24 0959   BP: 110/60   BP Site: L Arm   BP Position: Sitting   BP Cuff Size: Medium   Pulse: 97  SpO2: 94%   Height: 157.5 cm (5' 2)          09/28/24 9040   PainSc: 0-No pain     Body mass index is 23.05 kg/m??.      BP Readings from Last 3 Encounters:   09/28/24 110/60   09/20/24 124/61   09/10/24 154/79     Wt Readings from Last 3 Encounters:   09/20/24 57.2 kg (126 lb)   09/10/24 59.8 kg (131 lb 14.4 oz)   08/26/24 62.6 kg (138 lb)       Physical Exam:  GEN: chronically ill appearing, in wheelchair  EYES: Sclera anicteric, conjunctivae clear.   NOSE: Normal nares  MOUTH: Normal gums, mucosa, and palate.  CV: Irregularly irregular rhythm, rate 108 manual count  RESP: Poor air movement, insp/exp wheezing bilateral lung fields  EXT: 1+ pitting edema to mid shin  PSYCH: Mood and affect appropriate, normal insight and judgement.        [name], MD         [1]   Social History  Socioeconomic History    Marital status: Widowed     Spouse name: None Number of children: None    Years of education: None    Highest education level: None   Tobacco Use    Smoking status: Former     Current packs/day: 0.00     Types: Cigarettes    Smokeless tobacco: Never    Tobacco comments:     quit for 25 years, restarted in 2014. Denies any use of tobacco at this time   Vaping Use    Vaping status: Never Used   Substance and Sexual Activity    Alcohol use: Yes     Alcohol/week: 1.0 standard drink of alcohol     Types: 1 Cans of beer per week     Comment: daily    Drug use: No   Other Topics Concern    Do you use sunscreen? No    Tanning bed use? No    Are you easily burned? No    Excessive sun exposure? No    Blistering sunburns? Yes     Comment: Past   Social History Narrative    Lives in [place], wife passed 7 years ago. His daughter lives with him. Granddaughter, Erik [name], manages his finances.      Social Drivers of Psychologist, prison and probation services Strain: Low Risk  (06/08/2024)    Overall Financial Resource Strain (CARDIA)     Difficulty of Paying Living Expenses: Not hard at all   Food Insecurity: No Food Insecurity (06/08/2024)    Hunger Vital Sign     Worried About Running Out of Food in the Last Year: Never true     Ran Out of Food in the Last Year: Never true   Transportation Needs: No Transportation Needs (06/08/2024)    PRAPARE - Therapist, art (Medical): No     Lack of Transportation (Non-Medical): No   Physical Activity: Inactive (06/08/2024)    Exercise Vital Sign     Days of Exercise per Week: 0 days     Minutes of Exercise per Session: 0 min   Stress: No Stress Concern Present (06/08/2024)    [employer] of Occupational Health - Occupational Stress Questionnaire     Feeling of Stress: Not at all   Social Connections: Socially Isolated (06/08/2024)    Social Connection and Isolation Panel  Frequency of Communication with Friends and Family: More than three times a week     Frequency of Social Gatherings with Friends and Family: Twice a week     Attends Religious Services: Never     Database administrator or Organizations: No     Attends Banker Meetings: Never     Marital Status: Widowed   Housing: Low Risk  (06/08/2024)    Housing     Within the past 12 months, have you ever stayed: outside, in a car, in a tent, in an overnight shelter, or temporarily in someone else's home (i.e. couch-surfing)?: No     Are you worried about losing your housing?: No

## 2024-09-28 NOTE — Unmapped (Addendum)
 Tachycardic today, saw cards - increased metoprolol  to 75 bid from 50 bid  Discussed stopping atenolol   Bring all med bottles to next visit

## 2024-09-28 NOTE — Unmapped (Signed)
 Home Care [facility]  - Discharged on 09/10/2024 Admission date: 09/04/2024 - Discharge disposition: Home with Self Care        Service Provider Services Address Phone Fax Patient Preferred     [facility] - [facility] [address], [place] [address]

## 2024-10-01 NOTE — Unmapped (Signed)
 Addended by: KENN ANES on: 10/01/2024 10:32 AM     Modules accepted: Orders

## 2024-10-01 NOTE — Unmapped (Signed)
 Patient left voicemail requesting refill of actemra . For some reason it is not on his med list. Ok to go ahead and refill medication?

## 2024-10-03 MED ORDER — TOCILIZUMAB 162 MG/0.9 ML SUBCUTANEOUS SYRINGE
SUBCUTANEOUS | 3 refills | 112.00000 days | Status: CP
Start: 2024-10-03 — End: ?

## 2024-10-04 DIAGNOSIS — M316 Other giant cell arteritis: Principal | ICD-10-CM

## 2024-10-04 NOTE — Unmapped (Signed)
 pt states that actemra  is supposed to go to a pharmacy in [place] , and not a regular pharmacy. he wants someone to call him back to make sure this was done.

## 2024-10-04 NOTE — Unmapped (Signed)
 Medication was sent to [facility] pharmacy and they are working on PA and benefits check. Medication will be filled at pharmacy that insurance requests.     Patient will be notified once medication approved.

## 2024-10-05 NOTE — Unmapped (Signed)
 Please see encounter from 10/13. PA in process.

## 2024-10-08 DIAGNOSIS — G894 Chronic pain syndrome: Principal | ICD-10-CM

## 2024-10-08 DIAGNOSIS — M51369 DDD (degenerative disc disease), lumbar: Principal | ICD-10-CM

## 2024-10-08 MED ORDER — OXYCODONE-ACETAMINOPHEN 5 MG-325 MG TABLET
ORAL_TABLET | Freq: Four times a day (QID) | ORAL | 0 refills | 30.00000 days | Status: CP | PRN
Start: 2024-10-08 — End: 2024-11-07

## 2024-10-08 NOTE — Unmapped (Unsigned)
 Assessment and Plan:     Assessment & Plan  COPD exacerbation    (CMS-HCC)  Increasing shortness of breath, wheezing, since stopping prednisone  last week  Will restart prednisone  with a longer taper over 16 days, follow up in 2 wks in clinic to reevaluate before weaning off prednisone   Continue levoalbuterol prn and budesonide  bid           Longstanding persistent atrial fibrillation    (CMS-HCC)  Tachycardic today, saw cards - increased metoprolol  to 75 bid from 50 bid  Discussed stopping atenolol   Bring all med bottles to next visit           Essential hypertension, benign  Good control on current meds  Consider cutting amlodipine  dose in half if BP running low next visit  Continue daily home checks    Information on home health given to Mr Cockerell and his granddaughter - discussed with them to call to get on home health schedule - referral was placed on discharge  Does need help with bathing, discuss PCS application next visit         No follow-ups on file.     Subjective:     HPI: Erik [name] is a 89 y.o. male here for follow up COPD exacerbation  Seen 2 weeks ago in clinic for hosp follow up visit after COPD exacerbation, here today for follow up after extended prednisone  taper       Hx Afib on DOAC, CAD s/p CABG, HTN, COPD who was admitted to [facility] for acute hypoxic respiratory failure due to COPD exacerbation - hospitalized at [facility] 9/13-9/19  Discharged with a 9d prednisone  taper and one additional day of doxy  Did well following discharge, but worsening SOB after prednisone  taper ran out     Weight down since discharge (59.8kg on discharge 57.2 in clinic today), LE edema mild and improved since hospitalizatoin  Has been checking BP and pulse at home  BP 100-110s systolic at home, pulse range from 70s-110 on home checks  No chest pains  Took metoprolol  this morning  Did not stop atenolol  - picked up refill and thought he should continue taking it  Is off hydralazine      Home health referral placed on discharge but he hasn't heard anything from them yet  Lives alone  Granddaughter Delon checks on him every other day when not working  Could use help with bathing   Has shower chair but doesn't fit in shower well  Using walker at home and wheelchair in clinic    Also hx giant cell arteritis, followed by [facility] rheumatology clinic on actemra  injections every 3wks    Address:  ETOH use  Tobacco use  Hx GI bleed  2/2 duodenal ulcer       He is taking Actemra  162 mg SQ q 21days.   Social History[1]    ROS:   Review of systems negative unless otherwise noted as per HPI.      Objective:     Vitals:    10/12/24 1003   BP: 110/68   Pulse: 83   Temp: 35.7 ??C (96.2 ??F)   SpO2: 93%   Weight: 57.3 kg (126 lb 6.4 oz)   Height: 157.5 cm (5' 2)            10/12/24 1003   PainSc: 0-No pain       Body mass index is 23.12 kg/m??.      BP Readings from Last 3 Encounters:  10/12/24 110/68   09/28/24 126/57   09/28/24 110/60     Wt Readings from Last 3 Encounters:   10/12/24 57.3 kg (126 lb 6.4 oz)   09/20/24 57.2 kg (126 lb)   09/10/24 59.8 kg (131 lb 14.4 oz)       Physical Exam:  GEN: chronically ill appearing, in wheelchair  EYES: Sclera anicteric, conjunctivae clear.   NOSE: Normal nares  MOUTH: Normal gums, mucosa, and palate.  CV: Irregularly irregular rhythm, rate 108 manual count  RESP: Poor air movement, insp/exp wheezing bilateral lung fields  EXT: 1+ pitting edema to mid shin  PSYCH: Mood and affect appropriate, normal insight and judgement.        [name], MD      Assessment and Plan:     Assessment & Plan  COPD exacerbation    (CMS-HCC)             Longstanding persistent atrial fibrillation    (CMS-HCC)             Giant cell arteritis    (CMS-HCC)             Essential hypertension, benign           Assessment & Plan       No follow-ups on file.     Subjective:     HPI: Erik [name] is a 89 y.o. male here for an Follow-up.     Social History[2]    ROS:   Review of systems negative unless otherwise noted as per HPI.      Objective:     Vitals:    10/12/24 1003   BP: 110/68   Pulse: 83   Temp: 35.7 ??C (96.2 ??F)   SpO2: 93%   Weight: 57.3 kg (126 lb 6.4 oz)   Height: 157.5 cm (5' 2)          10/12/24 1003   PainSc: 0-No pain     Body mass index is 23.12 kg/m??.      BP Readings from Last 3 Encounters:   10/12/24 110/68   09/28/24 126/57   09/28/24 110/60     Wt Readings from Last 3 Encounters:   10/12/24 57.3 kg (126 lb 6.4 oz)   09/20/24 57.2 kg (126 lb)   09/10/24 59.8 kg (131 lb 14.4 oz)       Physical Exam:  GEN: Well-developed, well nourished, in no acute distress  EYES: Sclera anicteric, conjunctivae clear.   NOSE: Normal nares  EARS: Normal form and location.  External canals clear.  TMs intact, gray, light reflex present.  MOUTH: Normal gums, mucosa, and palate. Good dentition. Pharynx clear.   NECK:  No palpable cervical lymphadenopathy, trachea midline, neck supple, no thyromegaly  CV: Regular rate and rhythm. Normal S1 and S2. No murmurs, gallops, or rubs, no carotid bruits. Radial pulses are 2+ and symmetric.  RESP: Clear to auscultation bilaterally without wheezes, rhonchi or crackles.   GI: Normal abdominal bowel sounds. Soft, non-tender, and non-distended. No hepatosplenomegaly.  EXT: No lower extremity edema.   MSK: No acute deformity. Normal gait and station. Normal strength and tone in extremities.  SKIN: No rash or jaundice.  PSYCH: Mood and affect appropriate, normal insight and judgement.  NEURO: alert and oriented x3 and to situation. CN2-12 grossly intact. Patellar reflexes 2+       [name], MD         [1]   Social History  Socioeconomic History    Marital status: Widowed     Spouse name: None    Number of children: None    Years of education: None    Highest education level: None   Tobacco Use    Smoking status: Former     Current packs/day: 0.00     Types: Cigarettes    Smokeless tobacco: Never    Tobacco comments:     quit for 25 years, restarted in 2014. Denies any use of tobacco at this time   Vaping Use    Vaping status: Never Used   Substance and Sexual Activity    Alcohol use: Yes     Alcohol/week: 1.0 standard drink of alcohol     Types: 1 Cans of beer per week     Comment: daily    Drug use: No   Other Topics Concern    Do you use sunscreen? No    Tanning bed use? No    Are you easily burned? No    Excessive sun exposure? No    Blistering sunburns? Yes     Comment: Past   Social History Narrative    Lives in [place], wife passed 7 years ago. His daughter lives with him. Granddaughter, Delon, manages his finances.      Social Drivers of Psychologist, prison and probation services Strain: Low Risk  (06/08/2024)    Overall Financial Resource Strain (CARDIA)     Difficulty of Paying Living Expenses: Not hard at all   Food Insecurity: No Food Insecurity (06/08/2024)    Hunger Vital Sign     Worried About Running Out of Food in the Last Year: Never true     Ran Out of Food in the Last Year: Never true   Transportation Needs: No Transportation Needs (06/08/2024)    PRAPARE - Therapist, art (Medical): No     Lack of Transportation (Non-Medical): No   Physical Activity: Inactive (06/08/2024)    Exercise Vital Sign     Days of Exercise per Week: 0 days     Minutes of Exercise per Session: 0 min   Stress: No Stress Concern Present (06/08/2024)    [employer] of Occupational Health - Occupational Stress Questionnaire     Feeling of Stress: Not at all   Social Connections: Socially Isolated (06/08/2024)    Social Connection and Isolation Panel     Frequency of Communication with Friends and Family: More than three times a week     Frequency of Social Gatherings with Friends and Family: Twice a week     Attends Religious Services: Never     Database administrator or Organizations: No     Attends Banker Meetings: Never     Marital Status: Widowed   Housing: Low Risk  (06/08/2024)    Housing     Within the past 12 months, have you ever stayed: outside, in a car, in a tent, in an overnight shelter, or temporarily in someone else's home (i.e. couch-surfing)?: No     Are you worried about losing your housing?: No   [2]   Social History  Socioeconomic History    Marital status: Widowed     Spouse name: None    Number of children: None    Years of education: None    Highest education level: None   Tobacco Use    Smoking status: Former     Current packs/day: 0.00  Types: Cigarettes    Smokeless tobacco: Never    Tobacco comments:     quit for 25 years, restarted in 2014. Denies any use of tobacco at this time   Vaping Use    Vaping status: Never Used   Substance and Sexual Activity    Alcohol use: Yes     Alcohol/week: 1.0 standard drink of alcohol     Types: 1 Cans of beer per week     Comment: daily    Drug use: No   Other Topics Concern    Do you use sunscreen? No    Tanning bed use? No    Are you easily burned? No    Excessive sun exposure? No    Blistering sunburns? Yes     Comment: Past   Social History Narrative    Lives in [place], wife passed 7 years ago. His daughter lives with him. Granddaughter, Delon, manages his finances.      Social Drivers of Psychologist, prison and probation services Strain: Low Risk  (06/08/2024)    Overall Financial Resource Strain (CARDIA)     Difficulty of Paying Living Expenses: Not hard at all   Food Insecurity: No Food Insecurity (06/08/2024)    Hunger Vital Sign     Worried About Running Out of Food in the Last Year: Never true     Ran Out of Food in the Last Year: Never true   Transportation Needs: No Transportation Needs (06/08/2024)    PRAPARE - Therapist, art (Medical): No     Lack of Transportation (Non-Medical): No   Physical Activity: Inactive (06/08/2024)    Exercise Vital Sign     Days of Exercise per Week: 0 days     Minutes of Exercise per Session: 0 min   Stress: No Stress Concern Present (06/08/2024)    [employer] of Occupational Health - Occupational Stress Questionnaire     Feeling of Stress: Not at all   Social Connections: Socially Isolated (06/08/2024)    Social Connection and Isolation Panel     Frequency of Communication with Friends and Family: More than three times a week     Frequency of Social Gatherings with Friends and Family: Twice a week     Attends Religious Services: Never     Database administrator or Organizations: No     Attends Banker Meetings: Never     Marital Status: Widowed   Housing: Low Risk  (06/08/2024)    Housing     Within the past 12 months, have you ever stayed: outside, in a car, in a tent, in an overnight shelter, or temporarily in someone else's home (i.e. couch-surfing)?: No     Are you worried about losing your housing?: No

## 2024-10-08 NOTE — Unmapped (Signed)
 Patient is requesting the following refill  Requested Prescriptions     Pending Prescriptions Disp Refills    oxyCODONE -acetaminophen  (PERCOCET ) 5-325 mg per tablet 120 tablet 0     Sig: Take 1 tablet by mouth every six (6) hours as needed for pain.       Recent Visits  Date Type Provider Dept   09/28/24 Office Visit Fleeta Dunning, Rocky Norris, MD [facility] [address] [facility]   08/26/24 Office Visit Mangel, Benison Pap, DO [facility] [address] [facility]   02/03/24 Office Visit Mangel, Benison Pap, DO [facility] [address] [facility]   Showing recent visits within past 365 days and meeting all other requirements  Future Appointments  Date Type Provider Dept   10/12/24 Appointment Fleeta Dunning, Rocky Norris, MD  [facility] [address] [facility]   Showing future appointments within next 365 days and meeting all other requirements       Labs: Not applicable this refill

## 2024-10-08 NOTE — Unmapped (Signed)
 Copied from CRM #[id number]. Topic: Access To Clinicians - Medication Refill  >> Oct 08, 2024 10:00 AM Arland HERO wrote:  The patient is requesting the following:     Medication Name(s), Dose/Strengths, and Instructions: oxyCODONE -acetaminophen  (PERCOCET ) 5-325 mg per tablet    Quantity for 1 month supply    Ph[facility] and address[facility] [address] [place], [place] - [address]  [address] [place] [address]  Phone: [phone] Fax: [phone]    Coverage: yes, coverage is accurate on file.    States will be out of medication this weekend.      Does the caller want to be contacted regarding this request? Yes, He would like to know when script is sent to pharmacy.

## 2024-10-11 DIAGNOSIS — M316 Other giant cell arteritis: Principal | ICD-10-CM

## 2024-10-11 MED ORDER — TYENNE AUTOINJECTOR 162 MG/0.9 ML SUBCUTANEOUS PEN INJECTOR
SUBCUTANEOUS | 3 refills | 0.00000 days | Status: CP
Start: 2024-10-11 — End: ?

## 2024-10-12 DIAGNOSIS — M316 Other giant cell arteritis: Principal | ICD-10-CM

## 2024-10-12 DIAGNOSIS — I4811 Longstanding persistent atrial fibrillation: Principal | ICD-10-CM

## 2024-10-12 DIAGNOSIS — Z23 Encounter for immunization: Principal | ICD-10-CM

## 2024-10-12 DIAGNOSIS — J441 Chronic obstructive pulmonary disease with (acute) exacerbation: Principal | ICD-10-CM

## 2024-10-12 DIAGNOSIS — I10 Essential (primary) hypertension: Principal | ICD-10-CM

## 2024-10-12 NOTE — Unmapped (Signed)
 Drink at least 3 full glasses of water per day  Call [facility] [phone]  Purchase medium compression stockings and put them on in the morning to prevent swelling in your lower legs    Call if your breathing worsens, or if you have any lightheadedness

## 2024-10-13 NOTE — Unmapped (Signed)
 Specialty Medication(s): TYENNE AUTOINJECTOR 162 mg/0.9 mL Pnij (tocilizumab -aazg)    Erik [name] has been dis-enrolled from the [facility] specialty pharmacy services as a result of the copay being unaffordable. Per clinic CPP this patient has Actemra  mfg assistance until end of the year so patient can continue to get medication through the mfg assistance program.     Additional information provided to the patient: n/a    Erik [name], PharmD  [facility] Specialty Pharmacist

## 2024-10-13 NOTE — Unmapped (Signed)
 [facility] Specialty Medication Onboarding    Specialty Medication: Tyenne Autoinjector 162mg /0.15mL pen injection  Prior Authorization: Not Required   Financial Assistance: No - copay card is available but patient is required to apply  Final Copay/Day Supply: $229.91* / 28 days    Insurance Restrictions: Yes - max 1 month supply     Notes to Pharmacist: MyChart message sent to pt to sign up for [employer] copay assistance  Credit Card on File: no  Start Date on Rx:  N/A  Delivery Method (based on home address currently on file): Courier      The triage team has completed the benefits investigation and has determined that the patient is able to fill this medication at [facility]. Please contact the patient to complete the onboarding or follow up with the prescribing physician as needed.

## 2024-10-19 DIAGNOSIS — J449 Chronic obstructive pulmonary disease, unspecified: Principal | ICD-10-CM

## 2024-10-19 DIAGNOSIS — R0600 Dyspnea, unspecified: Principal | ICD-10-CM

## 2024-10-19 MED ORDER — LEVALBUTEROL 0.63 MG/3 ML SOLUTION FOR NEBULIZATION
RESPIRATORY_TRACT | 2 refills | 0.00000 days | Status: CP
Start: 2024-10-19 — End: ?

## 2024-10-25 DIAGNOSIS — R39198 Other difficulties with micturition: Principal | ICD-10-CM

## 2024-10-25 DIAGNOSIS — I25810 Atherosclerosis of coronary artery bypass graft(s) without angina pectoris: Principal | ICD-10-CM

## 2024-10-25 MED ORDER — SIMVASTATIN 10 MG TABLET
ORAL_TABLET | Freq: Every day | ORAL | 0 refills | 90.00000 days | Status: CP
Start: 2024-10-25 — End: ?

## 2024-10-25 MED ORDER — TAMSULOSIN 0.4 MG CAPSULE
ORAL_CAPSULE | Freq: Every day | ORAL | 0 refills | 90.00000 days | Status: CP
Start: 2024-10-25 — End: ?

## 2024-11-05 ENCOUNTER — Emergency Department
Admit: 2024-11-05 | Discharge: 2024-11-06 | Disposition: A | Discharge status: Home / Self Care | Payer: Medicare (Managed Care) | Attending: Student in an Organized Health Care Education/Training Program

## 2024-11-05 DIAGNOSIS — M7989 Other specified soft tissue disorders: Principal | ICD-10-CM

## 2024-11-05 DIAGNOSIS — L0390 Cellulitis, unspecified: Principal | ICD-10-CM

## 2024-11-05 MED ORDER — CEPHALEXIN 500 MG CAPSULE
ORAL_CAPSULE | Freq: Four times a day (QID) | ORAL | 0 refills | 5.00000 days | Status: CP
Start: 2024-11-05 — End: 2024-11-10

## 2024-11-08 DIAGNOSIS — G894 Chronic pain syndrome: Principal | ICD-10-CM

## 2024-11-08 DIAGNOSIS — M51369 DDD (degenerative disc disease), lumbar: Principal | ICD-10-CM

## 2024-11-08 MED ORDER — OXYCODONE-ACETAMINOPHEN 5 MG-325 MG TABLET
ORAL_TABLET | Freq: Four times a day (QID) | ORAL | 0 refills | 30.00000 days | Status: CP | PRN
Start: 2024-11-08 — End: 2024-12-08

## 2024-11-15 DIAGNOSIS — E871 Hypo-osmolality and hyponatremia: Principal | ICD-10-CM

## 2024-11-15 DIAGNOSIS — E7849 Other hyperlipidemia: Principal | ICD-10-CM

## 2024-11-15 DIAGNOSIS — I4891 Unspecified atrial fibrillation: Principal | ICD-10-CM

## 2024-11-15 DIAGNOSIS — N179 Acute kidney failure, unspecified: Principal | ICD-10-CM

## 2024-11-15 DIAGNOSIS — I10 Essential (primary) hypertension: Principal | ICD-10-CM

## 2024-11-15 DIAGNOSIS — R600 Localized edema: Principal | ICD-10-CM

## 2024-11-15 DIAGNOSIS — I2510 Atherosclerotic heart disease of native coronary artery without angina pectoris: Principal | ICD-10-CM

## 2024-11-23 ENCOUNTER — Encounter
Admit: 2024-11-23 | Discharge: 2024-11-23 | Payer: Medicare (Managed Care) | Attending: Internal Medicine | Primary: Internal Medicine

## 2024-11-23 DIAGNOSIS — J449 Chronic obstructive pulmonary disease, unspecified: Principal | ICD-10-CM

## 2024-11-23 DIAGNOSIS — R601 Generalized edema: Principal | ICD-10-CM

## 2024-11-23 DIAGNOSIS — N179 Acute kidney failure, unspecified: Principal | ICD-10-CM

## 2024-12-01 ENCOUNTER — Ambulatory Visit: Admit: 2024-12-01 | Payer: Medicare (Managed Care)

## 2024-12-01 ENCOUNTER — Ambulatory Visit
Admission: EM | Admit: 2024-12-01 | Discharge: 2024-12-10 | Disposition: A | Discharge status: Skilled Nursing Facility | Payer: Medicare (Managed Care) | Admitting: Student in an Organized Health Care Education/Training Program

## 2024-12-01 ENCOUNTER — Inpatient Hospital Stay
Admission: EM | Admit: 2024-12-01 | Discharge: 2024-12-10 | Disposition: A | Discharge status: Skilled Nursing Facility | Payer: Medicare (Managed Care) | Admitting: Student in an Organized Health Care Education/Training Program

## 2024-12-10 MED ORDER — SERTRALINE 25 MG TABLET
ORAL_TABLET | Freq: Every day | ORAL | 0 refills | 90.00000 days
Start: 2024-12-10 — End: 2024-12-10

## 2024-12-10 MED ORDER — POLYETHYLENE GLYCOL 3350 17 GRAM ORAL POWDER PACKET
PACK | Freq: Every day | ORAL | 2 refills | 60.00000 days | PRN
Start: 2024-12-10 — End: 2025-03-10

## 2024-12-10 MED ORDER — TORSEMIDE 60 MG TABLET
ORAL_TABLET | Freq: Two times a day (BID) | ORAL | 0 refills | 0.00000 days
Start: 2024-12-10 — End: 2025-03-10

## 2024-12-10 MED ORDER — SERTRALINE 50 MG TABLET
ORAL_TABLET | Freq: Every day | ORAL | 0 refills | 90.00000 days
Start: 2024-12-10 — End: 2025-03-10

## 2025-01-23 DEATH — deceased
